# Patient Record
Sex: Female | Born: 1975 | Race: Black or African American | Hispanic: No | Marital: Single | Smoking: Former smoker
Health system: Southern US, Community
[De-identification: ages and names within clinical notes are randomized; demographics above are authoritative.]

## PROBLEM LIST (undated history)

## (undated) DIAGNOSIS — F419 Anxiety disorder, unspecified: Secondary | ICD-10-CM

## (undated) DIAGNOSIS — K5732 Diverticulitis of large intestine without perforation or abscess without bleeding: Secondary | ICD-10-CM

## (undated) DIAGNOSIS — M1990 Unspecified osteoarthritis, unspecified site: Secondary | ICD-10-CM

## (undated) DIAGNOSIS — R011 Cardiac murmur, unspecified: Secondary | ICD-10-CM

## (undated) DIAGNOSIS — K219 Gastro-esophageal reflux disease without esophagitis: Secondary | ICD-10-CM

## (undated) DIAGNOSIS — E669 Obesity, unspecified: Secondary | ICD-10-CM

## (undated) DIAGNOSIS — Z22322 Carrier or suspected carrier of Methicillin resistant Staphylococcus aureus: Secondary | ICD-10-CM

## (undated) DIAGNOSIS — D509 Iron deficiency anemia, unspecified: Secondary | ICD-10-CM

## (undated) DIAGNOSIS — E0590 Thyrotoxicosis, unspecified without thyrotoxic crisis or storm: Secondary | ICD-10-CM

## (undated) DIAGNOSIS — L732 Hidradenitis suppurativa: Secondary | ICD-10-CM

## (undated) HISTORY — PX: TUBAL LIGATION: SHX77

## (undated) HISTORY — DX: Obesity, unspecified: E66.9

## (undated) HISTORY — DX: Diverticulitis of large intestine without perforation or abscess without bleeding: K57.32

---

## 1998-04-24 ENCOUNTER — Emergency Department (HOSPITAL_COMMUNITY): Admission: EM | Admit: 1998-04-24 | Discharge: 1998-04-24 | Payer: Self-pay | Admitting: Emergency Medicine

## 1998-10-12 ENCOUNTER — Emergency Department (HOSPITAL_COMMUNITY): Admission: EM | Admit: 1998-10-12 | Discharge: 1998-10-12 | Payer: Self-pay | Admitting: Emergency Medicine

## 1998-12-08 ENCOUNTER — Emergency Department (HOSPITAL_COMMUNITY): Admission: EM | Admit: 1998-12-08 | Discharge: 1998-12-08 | Payer: Self-pay | Admitting: Emergency Medicine

## 1998-12-26 ENCOUNTER — Emergency Department (HOSPITAL_COMMUNITY): Admission: EM | Admit: 1998-12-26 | Discharge: 1998-12-26 | Payer: Self-pay | Admitting: Emergency Medicine

## 1999-09-12 ENCOUNTER — Emergency Department (HOSPITAL_COMMUNITY): Admission: EM | Admit: 1999-09-12 | Discharge: 1999-09-12 | Payer: Self-pay | Admitting: Emergency Medicine

## 1999-10-02 ENCOUNTER — Encounter: Admission: RE | Admit: 1999-10-02 | Discharge: 1999-10-02 | Payer: Self-pay | Admitting: Obstetrics & Gynecology

## 1999-12-13 ENCOUNTER — Inpatient Hospital Stay (HOSPITAL_COMMUNITY): Admission: AD | Admit: 1999-12-13 | Discharge: 1999-12-13 | Payer: Self-pay | Admitting: Obstetrics & Gynecology

## 2000-06-11 ENCOUNTER — Emergency Department (HOSPITAL_COMMUNITY): Admission: EM | Admit: 2000-06-11 | Discharge: 2000-06-11 | Payer: Self-pay | Admitting: Emergency Medicine

## 2000-06-11 ENCOUNTER — Encounter: Payer: Self-pay | Admitting: Emergency Medicine

## 2000-07-10 ENCOUNTER — Emergency Department (HOSPITAL_COMMUNITY): Admission: EM | Admit: 2000-07-10 | Discharge: 2000-07-10 | Payer: Self-pay | Admitting: Emergency Medicine

## 2000-07-14 ENCOUNTER — Encounter: Payer: Self-pay | Admitting: Emergency Medicine

## 2000-07-14 ENCOUNTER — Emergency Department (HOSPITAL_COMMUNITY): Admission: EM | Admit: 2000-07-14 | Discharge: 2000-07-14 | Payer: Self-pay | Admitting: Emergency Medicine

## 2000-10-15 ENCOUNTER — Ambulatory Visit (HOSPITAL_COMMUNITY): Admission: RE | Admit: 2000-10-15 | Discharge: 2000-10-15 | Payer: Self-pay | Admitting: *Deleted

## 2000-10-15 ENCOUNTER — Encounter: Payer: Self-pay | Admitting: *Deleted

## 2001-02-09 ENCOUNTER — Ambulatory Visit (HOSPITAL_COMMUNITY): Admission: RE | Admit: 2001-02-09 | Discharge: 2001-02-09 | Payer: Self-pay | Admitting: *Deleted

## 2001-02-16 ENCOUNTER — Emergency Department (HOSPITAL_COMMUNITY): Admission: EM | Admit: 2001-02-16 | Discharge: 2001-02-16 | Payer: Self-pay | Admitting: Emergency Medicine

## 2001-03-01 ENCOUNTER — Inpatient Hospital Stay (HOSPITAL_COMMUNITY): Admission: AD | Admit: 2001-03-01 | Discharge: 2001-03-01 | Payer: Self-pay | Admitting: *Deleted

## 2001-03-06 ENCOUNTER — Encounter: Payer: Self-pay | Admitting: Obstetrics & Gynecology

## 2001-03-06 ENCOUNTER — Inpatient Hospital Stay (HOSPITAL_COMMUNITY): Admission: AD | Admit: 2001-03-06 | Discharge: 2001-03-06 | Payer: Self-pay | Admitting: Obstetrics & Gynecology

## 2001-03-10 ENCOUNTER — Ambulatory Visit (HOSPITAL_COMMUNITY): Admission: RE | Admit: 2001-03-10 | Discharge: 2001-03-10 | Payer: Self-pay | Admitting: Obstetrics

## 2001-03-13 ENCOUNTER — Encounter (HOSPITAL_COMMUNITY): Admission: RE | Admit: 2001-03-13 | Discharge: 2001-03-23 | Payer: Self-pay | Admitting: *Deleted

## 2001-03-20 ENCOUNTER — Encounter (INDEPENDENT_AMBULATORY_CARE_PROVIDER_SITE_OTHER): Payer: Self-pay | Admitting: Specialist

## 2001-03-20 ENCOUNTER — Inpatient Hospital Stay (HOSPITAL_COMMUNITY): Admission: AD | Admit: 2001-03-20 | Discharge: 2001-03-25 | Payer: Self-pay | Admitting: Obstetrics & Gynecology

## 2001-03-27 ENCOUNTER — Inpatient Hospital Stay (HOSPITAL_COMMUNITY): Admission: AD | Admit: 2001-03-27 | Discharge: 2001-03-27 | Payer: Self-pay | Admitting: Obstetrics & Gynecology

## 2001-09-29 ENCOUNTER — Emergency Department (HOSPITAL_COMMUNITY): Admission: EM | Admit: 2001-09-29 | Discharge: 2001-09-29 | Payer: Self-pay | Admitting: Emergency Medicine

## 2001-11-06 ENCOUNTER — Encounter: Admission: RE | Admit: 2001-11-06 | Discharge: 2001-11-06 | Payer: Self-pay | Admitting: Family Medicine

## 2001-11-06 ENCOUNTER — Other Ambulatory Visit: Admission: RE | Admit: 2001-11-06 | Discharge: 2001-11-06 | Payer: Self-pay | Admitting: Sports Medicine

## 2001-12-07 ENCOUNTER — Encounter: Admission: RE | Admit: 2001-12-07 | Discharge: 2001-12-07 | Payer: Self-pay | Admitting: Family Medicine

## 2002-01-06 ENCOUNTER — Encounter: Admission: RE | Admit: 2002-01-06 | Discharge: 2002-01-06 | Payer: Self-pay | Admitting: Family Medicine

## 2002-01-07 ENCOUNTER — Ambulatory Visit (HOSPITAL_COMMUNITY): Admission: RE | Admit: 2002-01-07 | Discharge: 2002-01-07 | Payer: Self-pay | Admitting: Family Medicine

## 2002-01-28 ENCOUNTER — Encounter: Admission: RE | Admit: 2002-01-28 | Discharge: 2002-01-28 | Payer: Self-pay | Admitting: Family Medicine

## 2002-02-23 ENCOUNTER — Inpatient Hospital Stay (HOSPITAL_COMMUNITY): Admission: AD | Admit: 2002-02-23 | Discharge: 2002-02-23 | Payer: Self-pay | Admitting: *Deleted

## 2002-02-24 ENCOUNTER — Inpatient Hospital Stay (HOSPITAL_COMMUNITY): Admission: AD | Admit: 2002-02-24 | Discharge: 2002-02-24 | Payer: Self-pay | Admitting: Obstetrics and Gynecology

## 2002-03-04 ENCOUNTER — Encounter: Admission: RE | Admit: 2002-03-04 | Discharge: 2002-03-04 | Payer: Self-pay | Admitting: Family Medicine

## 2002-03-18 ENCOUNTER — Inpatient Hospital Stay (HOSPITAL_COMMUNITY): Admission: AD | Admit: 2002-03-18 | Discharge: 2002-03-18 | Payer: Self-pay | Admitting: *Deleted

## 2002-04-03 ENCOUNTER — Inpatient Hospital Stay (HOSPITAL_COMMUNITY): Admission: AD | Admit: 2002-04-03 | Discharge: 2002-04-03 | Payer: Self-pay | Admitting: *Deleted

## 2002-04-06 ENCOUNTER — Encounter: Admission: RE | Admit: 2002-04-06 | Discharge: 2002-04-06 | Payer: Self-pay | Admitting: Family Medicine

## 2002-05-05 ENCOUNTER — Encounter: Admission: RE | Admit: 2002-05-05 | Discharge: 2002-05-05 | Payer: Self-pay | Admitting: Family Medicine

## 2002-05-10 ENCOUNTER — Encounter: Admission: RE | Admit: 2002-05-10 | Discharge: 2002-05-10 | Payer: Self-pay | Admitting: Family Medicine

## 2002-05-18 ENCOUNTER — Encounter: Admission: RE | Admit: 2002-05-18 | Discharge: 2002-05-18 | Payer: Self-pay | Admitting: Family Medicine

## 2002-05-21 ENCOUNTER — Encounter (INDEPENDENT_AMBULATORY_CARE_PROVIDER_SITE_OTHER): Payer: Self-pay

## 2002-05-21 ENCOUNTER — Inpatient Hospital Stay (HOSPITAL_COMMUNITY): Admission: AD | Admit: 2002-05-21 | Discharge: 2002-05-24 | Payer: Self-pay | Admitting: *Deleted

## 2002-05-27 ENCOUNTER — Encounter: Admission: RE | Admit: 2002-05-27 | Discharge: 2002-05-27 | Payer: Self-pay | Admitting: Family Medicine

## 2002-06-30 ENCOUNTER — Encounter: Admission: RE | Admit: 2002-06-30 | Discharge: 2002-06-30 | Payer: Self-pay | Admitting: Family Medicine

## 2002-10-02 ENCOUNTER — Emergency Department (HOSPITAL_COMMUNITY): Admission: EM | Admit: 2002-10-02 | Discharge: 2002-10-02 | Payer: Self-pay | Admitting: Emergency Medicine

## 2003-03-22 ENCOUNTER — Encounter: Admission: RE | Admit: 2003-03-22 | Discharge: 2003-03-22 | Payer: Self-pay | Admitting: Family Medicine

## 2003-04-05 ENCOUNTER — Encounter: Admission: RE | Admit: 2003-04-05 | Discharge: 2003-04-05 | Payer: Self-pay | Admitting: *Deleted

## 2003-04-12 ENCOUNTER — Encounter: Payer: Self-pay | Admitting: Sports Medicine

## 2003-04-12 ENCOUNTER — Encounter: Admission: RE | Admit: 2003-04-12 | Discharge: 2003-04-12 | Payer: Self-pay | Admitting: Sports Medicine

## 2003-05-09 ENCOUNTER — Other Ambulatory Visit: Admission: RE | Admit: 2003-05-09 | Discharge: 2003-05-09 | Payer: Self-pay | Admitting: Family Medicine

## 2003-05-09 ENCOUNTER — Encounter: Admission: RE | Admit: 2003-05-09 | Discharge: 2003-05-09 | Payer: Self-pay | Admitting: Family Medicine

## 2003-05-11 ENCOUNTER — Encounter: Admission: RE | Admit: 2003-05-11 | Discharge: 2003-05-11 | Payer: Self-pay | Admitting: Sports Medicine

## 2003-05-11 ENCOUNTER — Encounter: Payer: Self-pay | Admitting: Sports Medicine

## 2003-06-20 ENCOUNTER — Encounter: Admission: RE | Admit: 2003-06-20 | Discharge: 2003-06-20 | Payer: Self-pay | Admitting: Family Medicine

## 2003-06-24 ENCOUNTER — Encounter: Admission: RE | Admit: 2003-06-24 | Discharge: 2003-06-24 | Payer: Self-pay | Admitting: Family Medicine

## 2003-07-11 ENCOUNTER — Encounter: Admission: RE | Admit: 2003-07-11 | Discharge: 2003-07-11 | Payer: Self-pay | Admitting: Sports Medicine

## 2003-07-23 ENCOUNTER — Emergency Department (HOSPITAL_COMMUNITY): Admission: EM | Admit: 2003-07-23 | Discharge: 2003-07-23 | Payer: Self-pay | Admitting: Emergency Medicine

## 2003-07-24 ENCOUNTER — Emergency Department (HOSPITAL_COMMUNITY): Admission: AD | Admit: 2003-07-24 | Discharge: 2003-07-24 | Payer: Self-pay | Admitting: Emergency Medicine

## 2003-10-10 ENCOUNTER — Emergency Department (HOSPITAL_COMMUNITY): Admission: AD | Admit: 2003-10-10 | Discharge: 2003-10-10 | Payer: Self-pay | Admitting: Family Medicine

## 2005-06-24 ENCOUNTER — Ambulatory Visit: Payer: Self-pay | Admitting: Internal Medicine

## 2006-01-01 ENCOUNTER — Emergency Department (HOSPITAL_COMMUNITY): Admission: EM | Admit: 2006-01-01 | Discharge: 2006-01-01 | Payer: Self-pay | Admitting: Emergency Medicine

## 2006-03-19 ENCOUNTER — Emergency Department (HOSPITAL_COMMUNITY): Admission: EM | Admit: 2006-03-19 | Discharge: 2006-03-19 | Payer: Self-pay | Admitting: Emergency Medicine

## 2006-10-25 ENCOUNTER — Emergency Department (HOSPITAL_COMMUNITY): Admission: EM | Admit: 2006-10-25 | Discharge: 2006-10-25 | Payer: Self-pay | Admitting: Family Medicine

## 2006-11-04 ENCOUNTER — Ambulatory Visit: Payer: Self-pay | Admitting: Internal Medicine

## 2006-12-02 ENCOUNTER — Ambulatory Visit: Payer: Self-pay | Admitting: Internal Medicine

## 2007-01-18 ENCOUNTER — Emergency Department (HOSPITAL_COMMUNITY): Admission: EM | Admit: 2007-01-18 | Discharge: 2007-01-18 | Payer: Self-pay | Admitting: Family Medicine

## 2007-03-05 ENCOUNTER — Ambulatory Visit: Payer: Self-pay | Admitting: Family Medicine

## 2007-06-16 ENCOUNTER — Ambulatory Visit: Payer: Self-pay | Admitting: Internal Medicine

## 2007-08-06 ENCOUNTER — Ambulatory Visit: Payer: Self-pay | Admitting: Internal Medicine

## 2007-10-05 ENCOUNTER — Ambulatory Visit: Payer: Self-pay | Admitting: Internal Medicine

## 2007-10-20 ENCOUNTER — Ambulatory Visit: Payer: Self-pay | Admitting: Family Medicine

## 2008-09-15 ENCOUNTER — Ambulatory Visit: Payer: Self-pay | Admitting: Internal Medicine

## 2008-09-21 ENCOUNTER — Encounter: Admission: RE | Admit: 2008-09-21 | Discharge: 2008-09-21 | Payer: Self-pay | Admitting: Internal Medicine

## 2009-03-02 ENCOUNTER — Ambulatory Visit: Payer: Self-pay | Admitting: Internal Medicine

## 2009-03-02 ENCOUNTER — Encounter (INDEPENDENT_AMBULATORY_CARE_PROVIDER_SITE_OTHER): Payer: Self-pay | Admitting: Adult Health

## 2009-09-22 ENCOUNTER — Encounter: Admission: RE | Admit: 2009-09-22 | Discharge: 2009-09-22 | Payer: Self-pay | Admitting: Obstetrics

## 2009-11-25 HISTORY — PX: WISDOM TOOTH EXTRACTION: SHX21

## 2010-02-02 ENCOUNTER — Ambulatory Visit (HOSPITAL_COMMUNITY): Admission: RE | Admit: 2010-02-02 | Discharge: 2010-02-02 | Payer: Self-pay | Admitting: Obstetrics

## 2010-03-29 ENCOUNTER — Emergency Department (HOSPITAL_COMMUNITY): Admission: EM | Admit: 2010-03-29 | Discharge: 2010-03-29 | Payer: Self-pay | Admitting: Emergency Medicine

## 2010-10-05 ENCOUNTER — Emergency Department (HOSPITAL_COMMUNITY): Admission: EM | Admit: 2010-10-05 | Discharge: 2010-10-06 | Payer: Self-pay | Admitting: Emergency Medicine

## 2011-04-12 NOTE — Op Note (Signed)
[facility]  Patient:    [name] Visit Number: [id number] MRN: [id number]          Service Type: OBS Location: 910A 9135 01 Attending Physician:  Michaelle Copas Dictated by:   Clement Husbands, M.D. Proc. Date: 05/21/02 Admit Date:  05/21/2002                             Operative Report  PREOPERATIVE DIAGNOSES:       1. Previous cesarean section.                               2. Term pregnancy.                               3. Request for sterilization.  POSTOPERATIVE DIAGNOSES:      1. Previous cesarean section.                               2. Term pregnancy.                               3. Request for sterilization.  OPERATIONS:                   1. Repeat low transverse cervical cesarean                                  section.                               2. Bilateral tubal sterilization with partial                                  salpingectomies.  SURGEON:                      Clement Husbands, M.D.  ASSISTANT:                    Conni Elliot, M.D.  ANESTHESIA:                   Epidural.  DESCRIPTION OF PROCEDURE:     With the patient under satisfactory epidural anesthesia in the supine position, the abdomen was prepped, the bladder catheterized and the abdomen was also "slung" upwards with tape to lift the large panniculus out of the way.  The abdomen was draped.  A lower abdominal transverse skin incision was made in the old scar and carried down through thick subcutaneous layer to the rectus fascia, which was sharply and transversely divided.  The peritoneal cavity was entered.  Where the previous low transverse uterine scar was, was now a very large window. This almost extended from side to side and was probably 6 cm apart in the up-and-down dimension.  One could see the babys scalp, a hand and the umbilical cord.  The bladder peritoneum was carefully taken down.  The bladder was pushed  inferiorly.  The presenting amnion was nicked and  it was extended lateral.  Amniotic fluid was clear.  The vertex was delivered, followed by the rest of the baby.  Spontaneous respirations and cry were noted.  The cord was doubly clamped and divided.  The infant was shown to the mother and passed on to the pediatricians in attendance.  Cord blood was obtained.  The placenta was manually removed.  The uterus was explored and was clean.  The uterus was lifted up onto the abdominal wall.  The internal os was dilated.  The lower uterine segment of the uterus was markedly thinned out.  With care, the uterine "window" was closed with running locking 0 Vicryl suture.  The uterine musculature was then imbricated with a running 0 Vicryl suture.  There was a little bit of bleeding in the right corner, which required a figure-of-eight suture to secure.  The suture line was irrigated.  Hemostasis was good.  The fallopian tubes and ovaries were then identified and were normal.  Both fallopian tubes in turn were grasped in their midsegment.  Coagulating cautery made a little window and then the tubes on each side were individually ligated with 2-0 plain catgut.  The ligated segment was then excised so that the tubal stumps on each side were about 2 to 2.5 cm apart.  The suture of the uterine incision area was inspected. Hemostasis was good.  The uterus was positioned in the abdomen.  The anterior peritoneum was closed with a running 0 Vicryl suture.  The rectus muscles in the lower half were approximated with interrupted Vicryl suture. The rectus fascia was closed with two segments of running 0 Vicryl suture. The subcutaneous tissue layer was irrigated.  It was approximated with interrupted 3-0 Vicryl suture.  The skin edges were approximated with wide skin staples.  Estimated blood loss 300-500 cc.  Sponge and needle count was correct.  The patient tolerated the procedure well and was returned to  the recovery room in satisfactory condition. Dictated by:   Clement Husbands, M.D. Attending Physician:  Michaelle Copas DD:  05/21/02 TD:  05/23/02 Job: [id number]

## 2011-04-12 NOTE — Op Note (Signed)
[facility]  Patient:    [name], [name]                      MRN: [id number] Proc. Date: 03/23/01 Adm. Date:  [id number] Disc. Date: [id number] Attending:  Michaelle Copas                           Operative Report  PREOPERATIVE DIAGNOSES:       1. Intrauterine pregnancy at term.                               2. Mild pregnancy-induced hypertension,                                  gestational hypertension.                               3. Persistent occipitoposterior.                               4. Suspicious fetal heart tracing with                                  repetitive severe variable decelerations.  POSTOPERATIVE DIAGNOSES:      1. Intrauterine pregnancy at term.                               2. Mild pregnancy-induced hypertension,                                  gestational hypertension.                               3. Persistent occipitoposterior.                               4. Suspicious fetal heart tracing with                                  repetitive severe variable decelerations.  OPERATION:                    Primary low transverse cesarean section via                               Pfannenstiel.  SURGEON:                      Roseanna Rainbow, M.D.  ASSISTANT:                    Maryelizabeth Rowan, M.D.  ANESTHESIA:                   Epidural.  COMPLICATIONS:  None.  ESTIMATED BLOOD LOSS:         800 cc.  FLUIDS:                       1800 cc of lactated Ringers.  URINE OUTPUT:                 300 cc of clear urine.  INDICATIONS:                  The patient is a 35 year old para 0, at 38+ weeks.  Induced secondary to pregnancy-induced hypertension.  Repetitive severe variable decelerations with oxytocin, maximum dilatation 8 cm and persistent occiput posterior presentation.  FINDINGS:                     Anterior cephalic presentation.  Neonatology present at delivery.  Apgars 8 and 9 at  one and five minutes.  Weight 6 pounds and 6 ounces.  Umbilical artery pH 7.33.  Normal uterus, tubes, and ovaries.  DESCRIPTION OF PROCEDURE:     The patient was taken to the operating room where epidural anesthetic was found to be adequate.  She was then prepped and draped in the usual sterile fashion in the dorsal supine position with a leftward tilt.  A Pfannenstiel skin incision was then made with the scalpel and carried through to the underlying layer of fascia.  The fascia was nicked in the midline and the incision extended laterally with Mayo scissors.  The superior aspect of the fascial incision was then grasped with the Kocher clamps, elevated, and the underlying rectus muscles dissected off.  Attention was then turned to the inferior aspect of this incision which was manipulated in a similar fashion.  The rectus muscles were separated in the midline and the peritoneum identified, tinted up, and entered sharply.  The peritoneal incision was then extended superiorly and inferiorly with good visualization of the bladder.  The bladder blade was then inserted and the vesicouterine peritoneum identified, grasped with pickups, and entered sharply with the Metzenbaum scissors.  This incision was then extended laterally and the bladder flap created digitally.  The bladder blade was then reinserted and the lower uterine segment incised in a transverse fashion with the scalpel. The uterine incision was then extended laterally with the bandaged scissors. The bladder blade was removed and the infants head delivered atraumatically. The nose and mouth were suctioned with bulb suction, and the cord clamped and cut.  The infant was handed off to the awaiting neonatologist.  Umbilical artery cord gas was sent.  The placenta was then removed.  The uterus exteriorized and cleared of all clots and debris.  Upon inspection of the uterine incision, there is a small extension, approximately 1.5 cm in  length extending from the left inferolateral margin of the uterine incision.  This was repaired separately with 0 Monocryl in a running locked fashion.  The remainder of the uterine incision was repaired again with 0 Monocryl in a running locked fashion. Excellent hemostasis was noted.  The uterus was returned to the abdomen.  The gutters were cleared of all clots.  The fascia was reapproximated with 0 PDS in a running fashion.  The skin was closed with staples.  The patient tolerated the procedure well.  Sponge, lap, and needle counts were correct x 2.  The patient was taken to the PACU in stable condition. DD:  03/23/01 TD:  03/23/01 Job: [id number]

## 2011-04-12 NOTE — Discharge Summary (Signed)
[facility]  Patient:    [name], [name] Visit Number: [id number] MRN: [id number]          Service Type: OBS Location: 910A 9135 01 Attending Physician:  Michaelle Copas Dictated by:   Mont Dutton, M.D. Admit Date:  05/21/2002 Discharge Date: 05/24/2002                             Discharge Summary  DATE OF BIRTH:  11/19/1976  DISCHARGE DIAGNOSES: 1. Status post repeat low transverse cesarean section of a female. 2. Status post bilateral tubal ligation.  HISTORY OF PRESENT ILLNESS:  The patient is a 35 year old, G1, P0-1-0-1, with a prior cesarean section for cephalopelvic disproportion with an estimated date of confinement of 05/21/02, who presents for repeat cesarean section and tubal ligation.  HOSPITAL COURSE:  The patient underwent a low transverse cesarean section on 05/21/02, a viable 6 pound 12 ounce female with Apgars of 9 at one and 9 at five.  Also had a bilateral tubal sterilization at the same time by physicians Dr. Perlie Gold and Dr. Gavin Potters under epidural anesthesia.  Estimated blood loss was 300 to 500 cc.  For further details of delivery, please consult the dictated operative note by Dr. Perlie Gold.  Mother did well postoperatively, staples were intact.  She was bottle feeding her baby well, minimal lochia.  Hemoglobin postoperatively was 11.8.  Received fair pain relief with Percocet.  Routine postpartum care provided.  On the day of discharge, 05/24/02, the patient had been eating well, had bowel movements, tolerating her diet, wound appeared intact with staples intact and no drainage.  She was bottle feeding well, and the babys weight had surpassed its birth weight per mom.  For contraception she is status post bilateral tubal ligation.  The patient was stable for discharge.  Decision made for the patient to return to [facility]U in three days for staple removal secondary to her obesity.  The patient was stable for  discharge on 05/24/02.  DISCHARGE MEDICATIONS: 1. Percocet 5/325 mg one to two tabs p.o. q.6h. p.r.n. severe pain. 2. Ibuprofen 600 mg tabs one tab p.o. q.6h. p.r.n. moderate pain. 3. Prenatal vitamin one tab p.o. q.d.  FOLLOWUP: 1. Return to [facility] in three days for staple removal. 2. [facility] with Dr. Pricilla Holm in six weeks.  The    patient is to call [phone] to schedule.  The remainder of wound care per instruction booklet.  DIET:  Regular.  ACTIVITY:  Activity and sexual activity per booklet.  CONDITION ON DISCHARGE:  Stable. Dictated by:   Mont Dutton, M.D. Attending Physician:  Michaelle Copas DD:  05/24/02 TD:  05/25/02 Job: [id number]

## 2011-04-12 NOTE — Discharge Summary (Signed)
[facility]  Patient:    [name], [name]                      MRN: [id number] Adm. Date:  [id number] Disc. Date: [id number] Attending:  Michaelle Copas Dictator:   Jamey Reas, M.D.                           Discharge Summary  DATE OF BIRTH:                01/09/1976.  ADMISSION DIAGNOSIS:          Induction of labor for pregnancy induced                               hypertension.  DISCHARGE DIAGNOSES:          1. Induction of labor for pregnancy induced                                  hypertension.                               2. Status post low transverse cesarean section                                  for arrest of dilatation and deep variable                                  decelerations, persistent occiput posterior                                  position.  SERVICE:                      OB Teaching Service.  PROCEDURES:                   Primary low transverse cesarean section via Pfannenstiel performed by Dr. Antionette Char, assisted by Dr. Maryelizabeth Rowan on March 23, 2001.  HOSPITAL COURSE:              The patient is a 35 year old G2, P1-0-1-1, at 38+ weeks induced secondary to pregnancy induced hypertension. ______ Repetitive severe variable decelerations with oxytocin, maximum dilatation 8 cm and persistent occiput posterior presentation. She had a routine postoperative course. She delivered a viable female infant with Apgars of 8 at one minute, and 9 at five minutes. The patient was ambulating well at the time of discharge. The patient had elevated blood pressures but did not have any neurological symptoms associated with this. On discharge, her blood pressure was stable. The patient was tolerating a regular diet and ambulating without difficulty.  DISPOSITION:                  The patient will be discharged home.  DISCHARGE CONDITION:          Stable.  DISCHARGE MEDICATIONS:        1. Prenatal vitamins  one p.o. q.d. x six weeks.  2. Motrin 600 mg one p.o. q.6-8h. p.r.n. pain.                               3. Percocet one to two p.o. q.4-6h. p.r.n. pain.                               4. Ortho Tri-Cyclen take as directed, starting                                  Sunday, May 12.  DISCHARGE INSTRUCTIONS:       Routine postoperative.  DISCHARGE FOLLOWUP:           The patient is to followup at [facility] on Friday, May 3 staple removal. She is to follow up in six weeks at [facility] for routine postpartum visit. DD:  03/25/01 TD:  03/25/01 Job: [id number]

## 2011-08-17 ENCOUNTER — Emergency Department (HOSPITAL_COMMUNITY)
Admission: EM | Admit: 2011-08-17 | Discharge: 2011-08-17 | Disposition: A | Discharge status: Home / Self Care | Payer: Medicare Other | Attending: Emergency Medicine | Admitting: Emergency Medicine

## 2011-08-17 DIAGNOSIS — L02419 Cutaneous abscess of limb, unspecified: Secondary | ICD-10-CM | POA: Insufficient documentation

## 2011-08-17 DIAGNOSIS — F17200 Nicotine dependence, unspecified, uncomplicated: Secondary | ICD-10-CM | POA: Insufficient documentation

## 2011-08-17 DIAGNOSIS — J45909 Unspecified asthma, uncomplicated: Secondary | ICD-10-CM | POA: Insufficient documentation

## 2011-08-17 DIAGNOSIS — M79609 Pain in unspecified limb: Secondary | ICD-10-CM | POA: Insufficient documentation

## 2012-01-13 ENCOUNTER — Encounter (HOSPITAL_COMMUNITY): Payer: Self-pay | Admitting: *Deleted

## 2012-01-13 ENCOUNTER — Emergency Department (HOSPITAL_COMMUNITY)
Admission: EM | Admit: 2012-01-13 | Discharge: 2012-01-13 | Disposition: A | Discharge status: Home / Self Care | Payer: Medicare Other | Source: Home / Self Care

## 2012-01-13 DIAGNOSIS — M722 Plantar fascial fibromatosis: Secondary | ICD-10-CM

## 2012-01-13 HISTORY — DX: Hidradenitis suppurativa: L73.2

## 2012-01-13 NOTE — Discharge Instructions (Signed)
Try a foot insert in your shoes or slippers like Dr. Early Osmond foot inserts.   Take Alleve to help with the inflammation.   Plantar Fasciitis Plantar fasciitis is a common condition that causes foot pain. It is soreness (inflammation) of the band of tough fibrous tissue on the bottom of the foot that runs from the heel bone (calcaneus) to the ball of the foot. The cause of this soreness may be from excessive standing, poor fitting shoes, running on hard surfaces, being overweight, having an abnormal walk, or overuse (this is common in runners) of the painful foot or feet. It is also common in aerobic exercise dancers and ballet dancers. SYMPTOMS  Most people with plantar fasciitis complain of:  Severe pain in the morning on the bottom of their foot especially when taking the first steps out of bed. This pain recedes after a few minutes of walking.   Severe pain is experienced also during walking following a long period of inactivity.   Pain is worse when walking barefoot or up stairs  DIAGNOSIS   Your caregiver will diagnose this condition by examining and feeling your foot.   Special tests such as X-rays of your foot, are usually not needed.  PREVENTION   Consult a sports medicine professional before beginning a new exercise program.   Walking programs offer a good workout. With walking there is a lower chance of overuse injuries common to runners. There is less impact and less jarring of the joints.   Begin all new exercise programs slowly. If problems or pain develop, decrease the amount of time or distance until you are at a comfortable level.   Wear good shoes and replace them regularly.   Stretch your foot and the heel cords at the back of the ankle (Achilles tendon) both before and after exercise.   Run or exercise on even surfaces that are not hard. For example, asphalt is better than pavement.   Do not run barefoot on hard surfaces.   If using a treadmill, vary the incline.     Do not continue to workout if you have foot or joint problems. Seek professional help if they do not improve.  HOME CARE INSTRUCTIONS   Avoid activities that cause you pain until you recover.   Use ice or cold packs on the problem or painful areas after working out.   Only take over-the-counter or prescription medicines for pain, discomfort, or fever as directed by your caregiver.   Soft shoe inserts or athletic shoes with air or gel sole cushions may be helpful.   If problems continue or become more severe, consult a sports medicine caregiver or your own health care provider. Cortisone is a potent anti-inflammatory medication that may be injected into the painful area. You can discuss this treatment with your caregiver.  MAKE SURE YOU:   Understand these instructions.   Will watch your condition.   Will get help right away if you are not doing well or get worse.  Document Released: 08/06/2001 Document Revised: 07/24/2011 Document Reviewed: 10/05/2008 [facility] Patient Information 2012 [place], [place].

## 2012-01-13 NOTE — ED Provider Notes (Signed)
Medical screening examination/treatment/procedure(s) were performed by resident physician and as supervising physician I was immediately available for consultation/collaboration.  [name], MD  [name], MD 01/13/12 2155 

## 2012-01-13 NOTE — ED Provider Notes (Signed)
[name] is a 36 y.o. female who presents to Urgent Care today for Left foot pain. She states she was walking outside 2 weeks ago and stepped on holly leaf.  Since then she has been complaining of pain on Left heel, occasionally radiating to arch of foot.  Worse at end of day and also first thing in AM.  Only wears flip flops or slippers, no tennis shoes.  She has been put on doxycycline and hydrocodone for another condition but states this did not help her pain yesterday and therefore she presented to urgent Care.  No fevers or chills.     PMH reviewed.  ROS as above otherwise neg Medications reviewed. No current facility-administered medications for this encounter.   Current Outpatient Prescriptions  Medication Sig Dispense Refill  . Albuterol (PROVENTIL IN) Inhale into the lungs.      Marland Kitchen DOXYCYCLINE HYCLATE PO Take by mouth.      Marland Kitchen HYDROCODONE-ACETAMINOPHEN PO Take by mouth.        Exam:  BP 154/100  Pulse 81  Temp(Src) 98.8 F (37.1 C) (Oral)  Resp 20  SpO2 100%  LMP 12/23/2011 Gen: Well NAD HEENT: EOMI,  MMM Lungs: CTABL Nl WOB Heart: RRR no MRG Abd: NABS, NT, ND Exts: Non edematous BL  LE, warm and well perfused.  Right foot:  WNL Left foot:  TTP at heel of foot.  Tender through arch.  No swelling, erythema, edema, or signs of lymphangitis. No pain on plantar or dorsiflexion.  No sign of entrance wound, unable to palpate any foreign body located in foot.    Assessment and Plan: 1.  Plantar fascitis:  Most likely diagnosis in this obese female who has little arch support.  After discussion, patient admits to some foot pain prior to stepping on holly leaf.  Plan to treat with foot insert and better shoes.  Ibuprofen or Naproxen for pain relief.  FU with PCP in 2 weeks.     Renold Don, MD 01/13/12 2111

## 2012-01-13 NOTE — ED Notes (Signed)
C/O left foot pain after stepping on something approx 2 wks ago.  Has been taking her normal hydrocodone.

## 2012-04-16 ENCOUNTER — Encounter (HOSPITAL_COMMUNITY): Payer: Self-pay | Admitting: Cardiology

## 2012-04-16 ENCOUNTER — Emergency Department (INDEPENDENT_AMBULATORY_CARE_PROVIDER_SITE_OTHER)
Admission: EM | Admit: 2012-04-16 | Discharge: 2012-04-16 | Disposition: A | Discharge status: Home / Self Care | Payer: Medicare Other | Source: Home / Self Care | Attending: Emergency Medicine | Admitting: Emergency Medicine

## 2012-04-16 DIAGNOSIS — H109 Unspecified conjunctivitis: Secondary | ICD-10-CM

## 2012-04-16 MED ORDER — OLOPATADINE HCL 0.1 % OP SOLN: 1.0000 [drp] | Freq: Two times a day (BID) | OPHTHALMIC | Status: AC

## 2012-04-16 MED ORDER — POLYMYXIN B-TRIMETHOPRIM 10000-0.1 UNIT/ML-% OP SOLN: 1.0000 [drp] | OPHTHALMIC | Status: AC

## 2012-04-16 MED ORDER — CROMOLYN SODIUM 4 % OP SOLN: 1.0000 [drp] | Freq: Four times a day (QID) | OPHTHALMIC | Status: AC

## 2012-04-16 NOTE — ED Notes (Signed)
Pt reports 2 weeks ago bilat eyes were itchy and burning due to allergies. Has taken some OTC allergy med and used prescription eye drops from previous eye problem. Some relief but today has noticed drainage and crusting from right eye more than left. Denies fever.

## 2012-04-16 NOTE — Discharge Instructions (Signed)
Conjunctivitis Conjunctivitis is commonly called "pink eye." Conjunctivitis can be caused by bacterial or viral infection, allergies, or injuries. There is usually redness of the lining of the eye, itching, discomfort, and sometimes discharge. There may be deposits of matter along the eyelids. A viral infection usually causes a watery discharge, while a bacterial infection causes a yellowish, thick discharge. Pink eye is very contagious and spreads by direct contact. You may be given antibiotic eyedrops as part of your treatment. Before using your eye medicine, remove all drainage from the eye by washing gently with warm water and cotton balls. Continue to use the medication until you have awakened 2 mornings in a row without discharge from the eye. Do not rub your eye. This increases the irritation and helps spread infection. Use separate towels from other household members. Wash your hands with soap and water before and after touching your eyes. Use cold compresses to reduce pain and sunglasses to relieve irritation from light. Do not wear contact lenses or wear eye makeup until the infection is gone. SEEK MEDICAL CARE IF:   Your symptoms are not better after 3 days of treatment.   You have increased pain or trouble seeing.   The outer eyelids become very red or swollen.  Document Released: 12/19/2004 Document Revised: 10/31/2011 Document Reviewed: 11/11/2005 [employer] Patient Information 2012 [employer]. 

## 2012-04-16 NOTE — ED Provider Notes (Signed)
Chief Complaint  Patient presents with  . Eye Problem    History of Present Illness:   The patient is a 36 year old female who has had a long-standing history of allergic conjunctivitis for which she has taken Patanol. She ran out of this about a week ago and ever since then has had redness of both eyes with some swelling of her lids, itching, and some crusting. Her drainage has been white. She does have some pain in her eyes, particularly if she puts and eyedrops. She has some itching of her nose but no rhinorrhea or congestion. No sore throat, adenopathy, fever, or cough. Her vision has been normal.  Review of Systems:  Other than noted above, the patient denies any of the following symptoms: Systemic:  No fever, chills, sweats, fatigue, or weight loss. Eye:  No redness, eye pain, photophobia, discharge, blurred vision, or diplopia. ENT:  No nasal congestion, rhinorrhea, or sore throat. Lymphatic:  No adenopathy. Skin:  No rash or pruritis.  PMFSH:  Past medical history, family history, social history, meds, and allergies were reviewed.  Physical Exam:   Vital signs:  BP 137/69  Pulse 82  Temp(Src) 97.8 F (36.6 C) (Oral)  Resp 20  SpO2 100%  LMP 03/15/2012 General:  Alert and in no distress. Eye:  Her lids appear normal. Conjunctiva is are little bit injected. She does have some crusting of the eyelashes. There is no drainage or exudates in the conjunctival sac. The cornea is intact. Anterior chambers normal. PERRLA, full EOMs. ENT:  TMs and canals clear.  Nasal mucosa normal.  No intra-oral lesions, mucous membranes moist, pharynx clear. Neck:  No adenopathy tenderness or mass. Skin:  Clear, warm and dry.  Assessment:  The encounter diagnosis was Conjunctivitis. This appears to be allergic, but there could be some component of infection as well. I gave her a refill on her Patanol but I suggested she try Opticrom since it is a little bit cheaper.  Plan:   1.  The following meds  were prescribed:   New Prescriptions   CROMOLYN (OPTICROM) 4 % OPHTHALMIC SOLUTION    Place 1 drop into both eyes 4 (four) times daily.   OLOPATADINE (PATANOL) 0.1 % OPHTHALMIC SOLUTION    Place 1 drop into both eyes 2 (two) times daily.   TRIMETHOPRIM-POLYMYXIN B (POLYTRIM) OPHTHALMIC SOLUTION    Place 1 drop into both eyes every 4 (four) hours.   2.  The patient was instructed in symptomatic care and handouts were given. 3.  The patient was told to return if becoming worse in any way, if no better in 3 or 4 days, and given some red flag symptoms that would indicate earlier return.     Reuben Likes, MD 04/16/12 1213

## 2012-07-03 ENCOUNTER — Encounter: Payer: Self-pay | Admitting: *Deleted

## 2012-07-03 ENCOUNTER — Encounter: Payer: Medicare Other | Attending: Family Medicine | Admitting: *Deleted

## 2012-07-03 DIAGNOSIS — E669 Obesity, unspecified: Secondary | ICD-10-CM | POA: Insufficient documentation

## 2012-07-03 DIAGNOSIS — Z713 Dietary counseling and surveillance: Secondary | ICD-10-CM | POA: Insufficient documentation

## 2012-07-03 NOTE — Progress Notes (Signed)
  Medical Nutrition Therapy:  Appt start time: 0800 end time:  0900.  Assessment:  Primary concerns today: patient here for obesity. States history of diet pills, many diets and extreme food restrictions with no success. Walks track 5 times around every other day. She also states she is on disability due to her skin condition which is quite painful and requires a lot of care on a daily basis. She states she would like to lose about 100 pounds to improve her health and that she has been told the weight loss would improve her skin condition as well.  MEDICATIONS: see list   DIETARY INTAKE:  Usual eating pattern includes 2 meals and 0 snacks per day.  Everyday foods include eggs, tuna and small amounts of carbohydrate foods.  Avoided foods include fried and other high fat foods, sweets.    24-hr recall:  B ( AM): none  Snk ( AM): none  L (12 PM): 2 eggs OR tuna with hot sauce OR PNB and jelly sandwich, water with Hydro cut powder weigh loss powder Snk ( PM): none D (8 PM): 2 eggs OR tuna with hot sauce OR sweetened cereal with 8 oz 2 % milk Snk ( PM): none Beverages: water with weight loss powder for flavored  Usual physical activity: walk track 5 laps every other day and active with kids  Estimated energy needs: 1200 calories 135 g carbohydrates 90 g protein 33 g fat  Progress Towards Goal(s):  In progress.   Nutritional Diagnosis:  NI-1.5 Excessive energy intake As related to activity level.  As evidenced by BMI of 54.8.    Intervention:  Nutrition counseling provided. Discussed calorie value of macro-nutrients and concept of too little carbohydrate causing a decrease in metabolism. Suggest she add carbohydrate in form of fresh fruit at breakfast and be aware of 2-3 carb choices at lunch and supper. Also discussed increasing her activity level to daily as she has been maintaining her weight with every other day walking. Plan: Aim for 2-3 Carb choices at each meal Aim for 3 meals a  day Consider being active every day  Consider Zumba for 15 minutes every day that you don't walk the track and increase as tolerated  Handouts given during visit include:  Carb Counting and Beyond Handout  Reading Food Labels  Menu Planner with 3 days of sample menus of foods she eats  Monitoring/Evaluation:  Dietary intake, exercise, reading food labels, and body weight in 4 week(s).

## 2012-07-03 NOTE — Patient Instructions (Addendum)
Plan: Aim for 2-3 Carb choices at each meal Aim for 3 meals a day Consider being active every day  Consider Zumba for 15 minutes every day that you don't walk the track and increase as tolerated

## 2012-07-28 ENCOUNTER — Ambulatory Visit: Payer: Medicare Other | Admitting: *Deleted

## 2012-08-07 ENCOUNTER — Ambulatory Visit: Payer: Medicare Other | Admitting: *Deleted

## 2012-10-15 ENCOUNTER — Encounter: Payer: Medicare Other | Attending: Internal Medicine | Admitting: *Deleted

## 2012-10-15 DIAGNOSIS — E669 Obesity, unspecified: Secondary | ICD-10-CM | POA: Insufficient documentation

## 2012-10-15 DIAGNOSIS — Z713 Dietary counseling and surveillance: Secondary | ICD-10-CM | POA: Insufficient documentation

## 2012-10-15 NOTE — Progress Notes (Signed)
  Medical Nutrition Therapy:  Appt start time: 1630 end time:  1700.  Assessment:  Primary concerns today: patient here for obesity follow up visit. Has decided to eat all baked foods. Was eating 3 meals but got tired of that so went back to 1 meal a day and gained about 15 pounds. Now ready to go back to 3 meals a day. Now on diet pill of phentermine. Weight back down about 10 pounds but 3 pounds heavier than last visit in August.States she has signed up for the [facility] family plan but needs money to pay for it. Plans to go after kids get out of school each day @ 4 PM and plans to work on machines and walk the track. States she has switched from salt to Mrs. Dash and no longer adds mayo or salad dressing to foods. Has switched from whole to 2% milk.  MEDICATIONS: see list   DIETARY INTAKE:  Usual eating pattern includes 2 meals and 0 snacks per day.  Everyday foods include eggs, tuna and small amounts of carbohydrate foods.  Avoided foods include fried and other high fat foods, sweets.    24-hr recall:  B ( AM): none  Snk ( AM): none  L (12 PM): 2 eggs OR tuna with hot sauce OR PNB and jelly sandwich, water with Hydro cut powder weigh loss powder Snk ( PM): none D (8 PM): 2 eggs OR tuna with hot sauce OR sweetened cereal with 8 oz 2 % milk Snk ( PM): none Beverages: water with weight loss powder for flavored  Usual physical activity: none lately   Estimated energy needs: 1600 calories 180 g carbohydrates 120 g protein 44 g fat  Progress Towards Goal(s):  In progress.   Nutritional Diagnosis:  NI-1.5 Excessive energy intake As related to activity level.  As evidenced by BMI of 54.8.    Intervention:  Acknowledged the behavior changes she reported, explained rationale of not skipping meals once again, and encouraged her to plan to go to gym as planned even if she has to plan around MD appointments for the family. She has mixed emotions about losing weight as it will lead to her having a  skin surgery that she does not want. She is struggling with the health benefits and the surgery option.  Plan: Continue to aim for 3 Carb choices at each meal +/- 1 either way Aim for 3 meals a day Continue with plans to join [facility] and plan to go every afternoon at 4PM after kids are out of school Start with 15 minutes of activity each day and after 2 weeks increase as tolerated.  No new handouts given during this visit  Monitoring/Evaluation:  Dietary intake, exercise, reading food labels, and body weight in 5 weeks.

## 2012-10-15 NOTE — Patient Instructions (Addendum)
Plan: Continue to aim for 3 Carb choices at each meal +/- 1 either way Aim for 3 meals a day Continue with plans to join [facility] and plan to go every afternoon at 4PM after kids are out of school Start with 15 minutes of activity each day and after 2 weeks increase as tolerated.

## 2012-10-16 ENCOUNTER — Encounter: Payer: Self-pay | Admitting: *Deleted

## 2012-11-23 ENCOUNTER — Ambulatory Visit: Payer: Medicare Other | Admitting: *Deleted

## 2013-03-04 ENCOUNTER — Encounter (HOSPITAL_COMMUNITY): Payer: Self-pay | Admitting: *Deleted

## 2013-03-04 ENCOUNTER — Emergency Department (INDEPENDENT_AMBULATORY_CARE_PROVIDER_SITE_OTHER)
Admission: EM | Admit: 2013-03-04 | Discharge: 2013-03-04 | Disposition: A | Discharge status: Home / Self Care | Payer: Medicare Other | Source: Home / Self Care | Attending: Emergency Medicine | Admitting: Emergency Medicine

## 2013-03-04 DIAGNOSIS — J020 Streptococcal pharyngitis: Secondary | ICD-10-CM

## 2013-03-04 MED ORDER — CEPHALEXIN 500 MG PO CAPS: 500.0000 mg | ORAL_CAPSULE | Freq: Four times a day (QID) | ORAL | Status: DC

## 2013-03-04 NOTE — ED Notes (Addendum)
C/o sore throat onset 2 mos. ago. States it goes away and comes back.  It came back 2 weeks ago. C/o laryngititis, and taste buds on back of tongue hurt when she swallows. Has chills and gets hot. Temp was 99.6 when she checked it last weekend.  C/o feeling lightheaded and everything starts spinning.  Had a cold last week and saw Dr. Concepcion Elk and he gave her a Z-pack for bronchitis.  She got a chest xray.

## 2013-03-24 NOTE — ED Provider Notes (Signed)
History     CSN: [id number]  Arrival date & time 03/04/13  1634   First MD Initiated Contact with Patient 03/04/13 1706      Chief Complaint  Patient presents with  . Sore Throat    (Consider location/radiation/quality/duration/timing/severity/associated sxs/prior treatment) HPI  Past Medical History  Diagnosis Date  . Hydradenitis   . Asthma   . Obesity     Past Surgical History  Procedure Laterality Date  . Cesarean section      x2  . Wisdom tooth extraction  2011    Family History  Problem Relation Age of Onset  . Asthma Other   . Hyperlipidemia Other   . Hypertension Other   . Diabetes Mother   . Hypertension Mother   . Diabetes Father     History  Substance Use Topics  . Smoking status: Current Some Day Smoker -- 0.33 packs/day    Types: Cigarettes  . Smokeless tobacco: Never Used  . Alcohol Use: Yes     Comment: once a month at a party beer wine or mixed drinks    OB History   Grav Para Term Preterm Abortions TAB SAB Ect Mult Living                  Review of Systems  Allergies  Review of patient's allergies indicates no known allergies.  Home Medications   Current Outpatient Rx  Name  Route  Sig  Dispense  Refill  . Albuterol (PROVENTIL IN)   Inhalation   Inhale into the lungs.         . cromolyn (OPTICROM) 4 % ophthalmic solution   Both Eyes   Place 1 drop into both eyes 4 (four) times daily.   10 mL   12   . olopatadine (PATANOL) 0.1 % ophthalmic solution   Both Eyes   Place 1 drop into both eyes 2 (two) times daily.   5 mL   12   . phentermine 37.5 MG capsule   Oral   Take 37.5 mg by mouth every morning.         . cephALEXin (KEFLEX) 500 MG capsule   Oral   Take 1 capsule (500 mg total) by mouth 4 (four) times daily.   40 capsule   0   . DOXYCYCLINE HYCLATE PO   Oral   Take by mouth.         Marland Kitchen HYDROCODONE-ACETAMINOPHEN PO   Oral   Take by mouth.           LMP 02/26/2013  Physical Exam  ED Course   Procedures (including critical care time)  Labs Reviewed  POCT RAPID STREP A ([facility] ONLY) - Abnormal; Notable for the following:    Streptococcus, Group A Screen (Direct) POSITIVE (*)    All other components within normal limits   No results found.   1. Strep pharyngitis       MDM          Duwayne Heck de Marcello Moores, MD 03/24/13 1640

## 2013-09-15 ENCOUNTER — Encounter (HOSPITAL_COMMUNITY): Payer: Self-pay | Admitting: Emergency Medicine

## 2013-09-15 ENCOUNTER — Emergency Department (HOSPITAL_COMMUNITY)
Admission: EM | Admit: 2013-09-15 | Discharge: 2013-09-15 | Disposition: A | Discharge status: Home / Self Care | Payer: Medicare Other | Attending: Emergency Medicine | Admitting: Emergency Medicine

## 2013-09-15 DIAGNOSIS — F17200 Nicotine dependence, unspecified, uncomplicated: Secondary | ICD-10-CM | POA: Insufficient documentation

## 2013-09-15 DIAGNOSIS — M25561 Pain in right knee: Secondary | ICD-10-CM

## 2013-09-15 DIAGNOSIS — Z79899 Other long term (current) drug therapy: Secondary | ICD-10-CM | POA: Insufficient documentation

## 2013-09-15 DIAGNOSIS — Z791 Long term (current) use of non-steroidal anti-inflammatories (NSAID): Secondary | ICD-10-CM | POA: Insufficient documentation

## 2013-09-15 DIAGNOSIS — Z872 Personal history of diseases of the skin and subcutaneous tissue: Secondary | ICD-10-CM | POA: Insufficient documentation

## 2013-09-15 DIAGNOSIS — S8990XA Unspecified injury of unspecified lower leg, initial encounter: Secondary | ICD-10-CM | POA: Insufficient documentation

## 2013-09-15 DIAGNOSIS — J45909 Unspecified asthma, uncomplicated: Secondary | ICD-10-CM | POA: Insufficient documentation

## 2013-09-15 DIAGNOSIS — S99919A Unspecified injury of unspecified ankle, initial encounter: Secondary | ICD-10-CM | POA: Insufficient documentation

## 2013-09-15 DIAGNOSIS — IMO0002 Reserved for concepts with insufficient information to code with codable children: Secondary | ICD-10-CM | POA: Insufficient documentation

## 2013-09-15 DIAGNOSIS — X500XXA Overexertion from strenuous movement or load, initial encounter: Secondary | ICD-10-CM | POA: Insufficient documentation

## 2013-09-15 DIAGNOSIS — Y929 Unspecified place or not applicable: Secondary | ICD-10-CM | POA: Insufficient documentation

## 2013-09-15 DIAGNOSIS — Y9389 Activity, other specified: Secondary | ICD-10-CM | POA: Insufficient documentation

## 2013-09-15 MED ORDER — IBUPROFEN 400 MG PO TABS
800.0000 mg | ORAL_TABLET | Freq: Once | ORAL | Status: AC
  Administered 2013-09-15: 800 mg via ORAL
  Filled 2013-09-15: qty 2

## 2013-09-15 MED ORDER — IBUPROFEN 800 MG PO TABS: 800.0000 mg | ORAL_TABLET | Freq: Three times a day (TID) | ORAL | Status: DC

## 2013-09-15 NOTE — ED Notes (Signed)
Presents with right and left knee pain. Right knee pain began today while playing with cat and pt heard a pop, left knee is sore from bearing more weight on left side. Cms intact

## 2013-09-15 NOTE — ED Provider Notes (Signed)
CSN: [id number]     Arrival date & time 09/15/13  2120 History   First MD Initiated Contact with Patient 09/15/13 2204     Chief Complaint  Patient presents with  . Knee Pain   (Consider location/radiation/quality/duration/timing/severity/associated sxs/prior Treatment) HPI Comments: The patient is a 37 year old female with a past medical history of Obesity and Asthma presents to the ED with right knee pain for 1 day. She reports playing with her 38 month old kitten when she almost fell and caught herself on the wall with her hand and "heard a pop" in her right knee.  She reports mild pain 4/10 that is aggravated by stairs and movement.  She reports taking OTC pain medications and icing the affected area without relief.  She states that the leg has "given out on her" a few times.  Also reports mild pain in Right ankle. She was able to ambulate after the injury. Denies paresthesias, or numbness.      Patient is a 37 y.o. female presenting with knee pain. The history is provided by the patient.  Knee Pain   Past Medical History  Diagnosis Date  . Hydradenitis   . Asthma   . Obesity    Past Surgical History  Procedure Laterality Date  . Cesarean section      x2  . Wisdom tooth extraction  2011   Family History  Problem Relation Age of Onset  . Asthma Other   . Hyperlipidemia Other   . Hypertension Other   . Diabetes Mother   . Hypertension Mother   . Diabetes Father    History  Substance Use Topics  . Smoking status: Current Some Day Smoker -- 0.33 packs/day    Types: Cigarettes  . Smokeless tobacco: Never Used  . Alcohol Use: Yes     Comment: once a month at a party beer wine or mixed drinks   OB History   Grav Para Term Preterm Abortions TAB SAB Ect Mult Living                 Review of Systems  All other systems reviewed and are negative.    Allergies  Review of patient's allergies indicates no known allergies.  Home Medications   Current Outpatient Rx    Name  Route  Sig  Dispense  Refill  . Albuterol (PROVENTIL IN)   Inhalation   Inhale into the lungs.         . fluticasone (FLONASE) 50 MCG/ACT nasal spray   Nasal   Place 2 sprays into the nose daily.         Marland Kitchen HYDROCODONE-ACETAMINOPHEN PO   Oral   Take by mouth.         Marland Kitchen olopatadine (PATANOL) 0.1 % ophthalmic solution   Both Eyes   Place 1 drop into both eyes 2 (two) times daily.         . phentermine 37.5 MG capsule   Oral   Take 37.5 mg by mouth every morning.         Marland Kitchen ibuprofen (ADVIL,MOTRIN) 800 MG tablet   Oral   Take 1 tablet (800 mg total) by mouth 3 (three) times daily.   21 tablet   0    BP 143/90  Pulse 92  Temp(Src) 98.2 F (36.8 C) (Oral)  Resp 22  SpO2 100% Physical Exam  Nursing note and vitals reviewed. Constitutional: She appears well-developed and well-nourished. No distress.  Patient is a morbidly obese female  HENT:  Head: Normocephalic and atraumatic.  Eyes: EOM are normal.  Neck: Neck supple.  Cardiovascular: Normal rate and regular rhythm.   Pulmonary/Chest: Effort normal and breath sounds normal. She has no wheezes.  Abdominal: Soft. Normal appearance. There is no tenderness.  Musculoskeletal: Normal range of motion. She exhibits no edema.       Right knee: She exhibits normal range of motion, no swelling, no effusion, no ecchymosis, no deformity, no laceration, no erythema, normal alignment, no LCL laxity, normal patellar mobility and no MCL laxity. Tenderness found. Medial joint line tenderness noted.       Right ankle: Normal. She exhibits normal range of motion, no swelling, no ecchymosis, no deformity, no laceration and normal pulse. No AITFL tenderness found.       Legs: Full passive ROM with Right knee, reports pain with movement. Full ROM without pain with Right ankle.  NV intact.   Neurological: She is alert. She has normal strength. No sensory deficit.  Skin: Skin is warm and dry.  Psychiatric: Her behavior is  normal.    ED Course  Procedures (including critical care time) Labs Review Labs Reviewed - No data to display Imaging Review No results found.  EKG Interpretation   None       MDM   1. Knee pain, right    The patient is an obese female presenting to the ED with Right knee pain.  The physical exam is without swelling, ecchymosis, joint effusion, or decreased ROM. Patient reports she was able to ambulate throughout the day. Offered to X-ray her knee, but given history and lack of trauma the patient decided to follow up with ortho for further treatment if pain did not subside within a few days. Patient is stable for discharge.  Meds given in ED:  Medications  ibuprofen (ADVIL,MOTRIN) tablet 800 mg (800 mg Oral Given 09/15/13 2224)    Discharge Medication List as of 09/15/2013 10:25 PM    START taking these medications   Details  ibuprofen (ADVIL,MOTRIN) 800 MG tablet Take 1 tablet (800 mg total) by mouth 3 (three) times daily., Starting 09/15/2013, Until Discontinued, Print            Clabe Seal, PA-C 09/17/13 1426

## 2013-09-20 NOTE — ED Provider Notes (Signed)
Medical screening examination/treatment/procedure(s) were performed by non-physician practitioner and as supervising physician I was immediately available for consultation/collaboration.    Jennette Leask S Kikuye Korenek, MD 09/20/13 1636 

## 2014-12-15 ENCOUNTER — Other Ambulatory Visit: Payer: Self-pay | Admitting: Orthopedic Surgery

## 2014-12-15 DIAGNOSIS — M25561 Pain in right knee: Secondary | ICD-10-CM

## 2014-12-25 ENCOUNTER — Ambulatory Visit
Admission: RE | Admit: 2014-12-25 | Discharge: 2014-12-25 | Disposition: A | Discharge status: Home / Self Care | Payer: Medicare Other | Source: Ambulatory Visit | Attending: Orthopedic Surgery | Admitting: Orthopedic Surgery

## 2014-12-25 DIAGNOSIS — M25561 Pain in right knee: Secondary | ICD-10-CM

## 2015-02-08 ENCOUNTER — Other Ambulatory Visit: Payer: Self-pay | Admitting: Obstetrics

## 2015-02-08 DIAGNOSIS — L732 Hidradenitis suppurativa: Secondary | ICD-10-CM

## 2015-02-15 ENCOUNTER — Ambulatory Visit
Admission: RE | Admit: 2015-02-15 | Discharge: 2015-02-15 | Disposition: A | Discharge status: Home / Self Care | Payer: Medicare Other | Source: Ambulatory Visit | Attending: Obstetrics | Admitting: Obstetrics

## 2015-02-15 DIAGNOSIS — L732 Hidradenitis suppurativa: Secondary | ICD-10-CM

## 2016-07-02 ENCOUNTER — Ambulatory Visit (HOSPITAL_COMMUNITY)
Admission: EM | Admit: 2016-07-02 | Discharge: 2016-07-02 | Disposition: A | Discharge status: Home / Self Care | Payer: Medicare Other | Attending: Family Medicine | Admitting: Family Medicine

## 2016-07-02 ENCOUNTER — Encounter (HOSPITAL_COMMUNITY): Payer: Self-pay | Admitting: Emergency Medicine

## 2016-07-02 DIAGNOSIS — L732 Hidradenitis suppurativa: Secondary | ICD-10-CM | POA: Diagnosis not present

## 2016-07-02 DIAGNOSIS — Z833 Family history of diabetes mellitus: Secondary | ICD-10-CM | POA: Insufficient documentation

## 2016-07-02 DIAGNOSIS — Z8249 Family history of ischemic heart disease and other diseases of the circulatory system: Secondary | ICD-10-CM | POA: Insufficient documentation

## 2016-07-02 DIAGNOSIS — F17210 Nicotine dependence, cigarettes, uncomplicated: Secondary | ICD-10-CM | POA: Diagnosis not present

## 2016-07-02 DIAGNOSIS — Z202 Contact with and (suspected) exposure to infections with a predominantly sexual mode of transmission: Secondary | ICD-10-CM | POA: Insufficient documentation

## 2016-07-02 MED ORDER — PENICILLIN G BENZATHINE 1200000 UNIT/2ML IM SUSP
INTRAMUSCULAR | Status: AC
  Filled 2016-07-02: qty 2

## 2016-07-02 MED ORDER — PENICILLIN G BENZATHINE 1200000 UNIT/2ML IM SUSP
2.4000 10*6.[IU] | Freq: Once | INTRAMUSCULAR | Status: AC
Start: 2016-07-02 — End: 2016-07-02
  Administered 2016-07-02: 2.4 10*6.[IU] via INTRAMUSCULAR

## 2016-07-02 MED ORDER — DOXYCYCLINE HYCLATE 100 MG PO CAPS: 100.0000 mg | ORAL_CAPSULE | Freq: Two times a day (BID) | ORAL | 0 refills | Status: DC

## 2016-07-02 NOTE — ED Notes (Signed)
Verified phone number in demographics

## 2016-07-02 NOTE — ED Triage Notes (Signed)
Patient denies any symptoms, no urinary symptoms, no vaginal discharge.  Patient reports partner is positive for syphillis

## 2016-07-03 LAB — HIV ANTIBODY (ROUTINE TESTING W REFLEX): HIV Screen 4th Generation wRfx: NONREACTIVE

## 2016-07-03 LAB — CERVICOVAGINAL ANCILLARY ONLY
Chlamydia: NEGATIVE
Neisseria Gonorrhea: NEGATIVE
Wet Prep (BD Affirm): POSITIVE — AB

## 2016-07-03 LAB — RPR: RPR Ser Ql: NONREACTIVE

## 2016-07-04 ENCOUNTER — Telehealth: Payer: Self-pay | Admitting: Internal Medicine

## 2016-07-04 MED ORDER — FLUCONAZOLE 150 MG PO TABS: 150.0000 mg | ORAL_TABLET | Freq: Once | ORAL | 0 refills | Status: AC

## 2016-07-04 NOTE — Telephone Encounter (Signed)
Clinical staff, please let patient know that test for candida (yeast) was positive.   Rx fluconazole [facility] [address]   Recheck or followup PCP/Edwin Avbuere for persistent symptoms.  LM

## 2016-07-08 NOTE — ED Provider Notes (Signed)
CSN: [id number]     Arrival date & time 07/02/16  1120 History   First MD Initiated Contact with Patient 07/02/16 1221     Chief Complaint  Patient presents with  . Exposure to STD   (Consider location/radiation/quality/duration/timing/severity/associated sxs/prior Treatment) Patient presents with c/o being exposed to syphillis.  She states her boyfriend has syphillis and she has been having unprotected sex.  She is having a outbreak of hydradenitis supparativa.   The history is provided by the patient.  Exposure to STD  This is a new problem. The current episode started more than 1 week ago. The problem occurs constantly. The problem has not changed since onset.Pertinent negatives include no chest pain. The symptoms are aggravated by intercourse. She has tried nothing for the symptoms.    Past Medical History:  Diagnosis Date  . Asthma   . Hydradenitis   . Obesity    Past Surgical History:  Procedure Laterality Date  . CESAREAN SECTION     x2  . WISDOM TOOTH EXTRACTION  2011   Family History  Problem Relation Age of Onset  . Diabetes Mother   . Hypertension Mother   . Diabetes Father   . Asthma Other   . Hyperlipidemia Other   . Hypertension Other    Social History  Substance Use Topics  . Smoking status: Current Some Day Smoker    Packs/day: 0.33    Types: Cigarettes  . Smokeless tobacco: Never Used  . Alcohol use Yes     Comment: once a month at a party beer wine or mixed drinks   OB History    No data available     Review of Systems  Constitutional: Negative.   HENT: Negative.   Eyes: Negative.   Respiratory: Negative.   Cardiovascular: Negative.  Negative for chest pain.  Gastrointestinal: Negative.   Endocrine: Negative.   Genitourinary: Negative.   Musculoskeletal: Negative.   Skin: Positive for wound.  Allergic/Immunologic: Negative.   Neurological: Negative.   Hematological: Negative.   Psychiatric/Behavioral: Negative.     Allergies  Review  of patient's allergies indicates no known allergies.  Home Medications   Prior to Admission medications   Medication Sig Start Date End Date Taking? Authorizing Provider  Albuterol (PROVENTIL IN) Inhale into the lungs.    Historical Provider, MD  doxycycline (VIBRAMYCIN) 100 MG capsule Take 1 capsule (100 mg total) by mouth 2 (two) times daily. 07/02/16   Deatra CanterWilliam J Laderius Valbuena, FNP  fluticasone (FLONASE) 50 MCG/ACT nasal spray Place 2 sprays into the nose daily.    Historical Provider, MD  HYDROCODONE-ACETAMINOPHEN PO Take by mouth.    Historical Provider, MD  ibuprofen (ADVIL,MOTRIN) 800 MG tablet Take 1 tablet (800 mg total) by mouth 3 (three) times daily. 09/15/13   Mellody DrownLauren Parker, PA-C  olopatadine (PATANOL) 0.1 % ophthalmic solution Place 1 drop into both eyes 2 (two) times daily.    Historical Provider, MD  phentermine 37.5 MG capsule Take 37.5 mg by mouth every morning.    Historical Provider, MD   Meds Ordered and Administered this Visit   Medications  penicillin g benzathine (BICILLIN LA) 1200000 UNIT/2ML injection 2.4 Million Units (2.4 Million Units Intramuscular Given 07/02/16 1249)    BP 146/86 (BP Location: Right Arm) Comment (BP Location): large cuff  Pulse 80   Temp 98.6 F (37 C) (Oral)   Resp 22   SpO2 99%  No data found.   Physical Exam  Constitutional: She appears well-developed and well-nourished.  HENT:  Head: Normocephalic and atraumatic.  Right Ear: External ear normal.  Left Ear: External ear normal.  Eyes: EOM are normal. Pupils are equal, round, and reactive to light.  Neck: Normal range of motion.  Cardiovascular: Normal rate.   Pulmonary/Chest: Effort normal.  Abdominal: Soft.  Skin:  Cellulitis bilataral axilla and abdominal folds  Nursing note and vitals reviewed.   Urgent Care Course   Clinical Course    Procedures (including critical care time)  Labs Review Labs Reviewed  CERVICOVAGINAL ANCILLARY ONLY - Abnormal; Notable for the following:        Result Value   Wet Prep (BD Affirm) **POSITIVE for Candida** (*)    All other components within normal limits  RPR  HIV ANTIBODY (ROUTINE TESTING)  CERVICOVAGINAL ANCILLARY ONLY    Imaging Review No results found.   Visual Acuity Review  Right Eye Distance:   Left Eye Distance:   Bilateral Distance:    Right Eye Near:   Left Eye Near:    Bilateral Near:         MDM   1. STD exposure   2. Hidradenitis suppurativa    PCN 2.4 million units  IM x 1 for exposure to Syphillis Doxycycline 100mg  one po bid x 10 days #20    Deatra CanterWilliam J Corrissa Martello, FNP 07/08/16 2133

## 2016-07-10 ENCOUNTER — Encounter (HOSPITAL_COMMUNITY): Payer: Self-pay | Admitting: *Deleted

## 2016-07-10 ENCOUNTER — Ambulatory Visit (HOSPITAL_COMMUNITY)
Admission: EM | Admit: 2016-07-10 | Discharge: 2016-07-10 | Disposition: A | Discharge status: Home / Self Care | Payer: Medicare Other | Attending: Family Medicine | Admitting: Family Medicine

## 2016-07-10 DIAGNOSIS — H65193 Other acute nonsuppurative otitis media, bilateral: Secondary | ICD-10-CM

## 2016-07-10 MED ORDER — FLUTICASONE PROPIONATE 50 MCG/ACT NA SUSP: 2.0000 | Freq: Every day | NASAL | 0 refills | Status: DC

## 2016-07-10 MED ORDER — OXYMETAZOLINE HCL 0.05 % NA SOLN: 1.0000 | Freq: Two times a day (BID) | NASAL | 0 refills | Status: DC

## 2016-07-10 NOTE — ED Provider Notes (Signed)
[facility]    CSN: [id number] Arrival date & time: 07/10/16  [id number]  First Provider Contact:  First MD Initiated Contact with Patient 07/10/16 1012     History   Chief Complaint Chief Complaint  Patient presents with  . Otalgia   HPI [name] is a 40 y.o. female presenting for ear pain.   She reports 1 week of fullness/itching in the right ear with pressure-type pain starting yesterday. No dizziness, change in hearing, fever, sore throat, cough, rhinorrhea. + congestion.   She was evaluated here yesterday for syphilis exposure, given PCN IM and also doxycycline for hidradenitis flare. Tolerating this well.   Past Medical History:  Diagnosis Date  . Asthma   . Hydradenitis   . Obesity     There are no active problems to display for this patient.   Past Surgical History:  Procedure Laterality Date  . CESAREAN SECTION     x2  . WISDOM TOOTH EXTRACTION  2011    OB History    No data available       Home Medications    Prior to Admission medications   Medication Sig Start Date End Date Taking? Authorizing Provider  Albuterol (PROVENTIL IN) Inhale into the lungs.    Historical Provider, MD  doxycycline (VIBRAMYCIN) 100 MG capsule Take 1 capsule (100 mg total) by mouth 2 (two) times daily. 07/02/16   Deatra CanterWilliam J Oxford, FNP  fluticasone (FLONASE) 50 MCG/ACT nasal spray Place 2 sprays into both nostrils daily. 07/10/16   Tyrone Nineyan B Kahron Kauth, MD  HYDROCODONE-ACETAMINOPHEN PO Take by mouth.    Historical Provider, MD  ibuprofen (ADVIL,MOTRIN) 800 MG tablet Take 1 tablet (800 mg total) by mouth 3 (three) times daily. 09/15/13   Mellody DrownLauren Parker, PA-C  olopatadine (PATANOL) 0.1 % ophthalmic solution Place 1 drop into both eyes 2 (two) times daily.    Historical Provider, MD  oxymetazoline (AFRIN) 0.05 % nasal spray Place 1 spray into both nostrils 2 (two) times daily. for no more than 5 days 07/10/16   Tyrone Nineyan B Alayiah Fontes, MD  phentermine 37.5 MG capsule Take 37.5 mg by mouth  every morning.    Historical Provider, MD    Family History Family History  Problem Relation Age of Onset  . Diabetes Mother   . Hypertension Mother   . Diabetes Father   . Asthma Other   . Hyperlipidemia Other   . Hypertension Other     Social History Social History  Substance Use Topics  . Smoking status: Current Some Day Smoker    Packs/day: 0.33    Types: Cigarettes  . Smokeless tobacco: Never Used  . Alcohol use Yes     Comment: once a month at a party beer wine or mixed drinks     Allergies   Review of patient's allergies indicates no known allergies.   Review of Systems Review of Systems As above  Physical Exam Triage Vital Signs ED Triage Vitals  Enc Vitals Group     BP      Pulse      Resp      Temp      Temp src      SpO2      Weight      Height      Head Circumference      Peak Flow      Pain Score      Pain Loc      Pain Edu?  Excl. in GC?    No data found.   Updated Vital Signs BP 120/80 (BP Location: Right Arm)   Pulse 78   Temp 98.6 F (37 C) (Oral)   Resp 18   LMP 07/10/2016   SpO2 99%   Physical Exam  Constitutional: She is oriented to person, place, and time. She appears well-developed and well-nourished. No distress.  Eyes: EOM are normal. Pupils are equal, round, and reactive to light. No scleral icterus.  Neck: Neck supple. No JVD present.  Cardiovascular: Normal rate, regular rhythm, normal heart sounds and intact distal pulses.   No murmur heard. Pulmonary/Chest: Effort normal and breath sounds normal. No respiratory distress.  Abdominal: Soft. Bowel sounds are normal. She exhibits no distension. There is no tenderness.  Musculoskeletal: Normal range of motion. She exhibits no edema or tenderness.  Lymphadenopathy:    She has no cervical adenopathy.  Neurological: She is alert and oriented to person, place, and time. She exhibits normal muscle tone.  Skin: Skin is warm and dry. Capillary refill takes less than 2  seconds.  Vitals reviewed.  UC Treatments / Results  Labs (all labs ordered are listed, but only abnormal results are displayed) Labs Reviewed - No data to display  EKG  EKG Interpretation None       Radiology No results found.  Procedures Procedures (including critical care time)  Medications Ordered in UC Medications - No data to display  Initial Impression / Assessment and Plan / UC Course  I have reviewed the triage vital signs and the nursing notes.  Pertinent labs & imaging results that were available during my care of the patient were reviewed by me and considered in my medical decision making (see chart for details).  Final Clinical Impressions(s) / UC Diagnoses   Final diagnoses:  Acute middle ear effusion, bilateral   40 y.o. female with bilateral effusions related to h/o allergic rhinitis +/- viral URI with congestion. Will Rx IN steroid and short course decongestant. Continue other treatments as directed and follow up if fever develops or with PCP as needed.   New Prescriptions New Prescriptions   OXYMETAZOLINE (AFRIN) 0.05 % NASAL SPRAY    Place 1 spray into both nostrils 2 (two) times daily. for no more than 5 days     Tyrone Nineyan B Pati Thinnes, MD 07/10/16 [phone]

## 2016-07-10 NOTE — Discharge Instructions (Signed)
Your symptom is due to fluid backing up into your ears. This is likely to be from a virus or allergies. Use the nose spray to loosen the path for the fluid in your ears to drain. Use AFRIN only for the next 5 days and continue FLONASE as directed.

## 2016-07-10 NOTE — ED Triage Notes (Signed)
Pt  Reports    Symptoms  Of       r   Earache   Since  Yesterday  She  Reports  A  Sensation    Of  Ear  Irritation    X  1  Week

## 2017-03-28 ENCOUNTER — Other Ambulatory Visit: Payer: Self-pay | Admitting: Internal Medicine

## 2017-04-09 ENCOUNTER — Other Ambulatory Visit: Payer: Self-pay | Admitting: Internal Medicine

## 2017-04-09 DIAGNOSIS — Z1231 Encounter for screening mammogram for malignant neoplasm of breast: Secondary | ICD-10-CM

## 2017-04-19 ENCOUNTER — Encounter (HOSPITAL_COMMUNITY): Payer: Self-pay

## 2017-04-19 ENCOUNTER — Emergency Department (HOSPITAL_COMMUNITY)
Admission: EM | Admit: 2017-04-19 | Discharge: 2017-04-20 | Disposition: A | Discharge status: Home / Self Care | Payer: Medicare Other | Attending: Emergency Medicine | Admitting: Emergency Medicine

## 2017-04-19 DIAGNOSIS — T7840XA Allergy, unspecified, initial encounter: Secondary | ICD-10-CM | POA: Insufficient documentation

## 2017-04-19 MED ORDER — DIPHENHYDRAMINE HCL 50 MG/ML IJ SOLN
50.0000 mg | Freq: Once | INTRAMUSCULAR | Status: AC
  Administered 2017-04-19: 50 mg via INTRAVENOUS
  Filled 2017-04-19: qty 1

## 2017-04-19 MED ORDER — EPINEPHRINE 0.3 MG/0.3ML IJ SOAJ
0.3000 mg | Freq: Once | INTRAMUSCULAR | Status: AC
  Administered 2017-04-19: 0.3 mg via INTRAMUSCULAR
  Filled 2017-04-19: qty 0.3

## 2017-04-19 MED ORDER — METHYLPREDNISOLONE SODIUM SUCC 125 MG IJ SOLR
125.0000 mg | Freq: Once | INTRAMUSCULAR | Status: AC
  Administered 2017-04-19: 125 mg via INTRAVENOUS
  Filled 2017-04-19: qty 2

## 2017-04-19 MED ORDER — FAMOTIDINE IN NACL 20-0.9 MG/50ML-% IV SOLN
20.0000 mg | Freq: Once | INTRAVENOUS | Status: AC
Start: 2017-04-19 — End: 2017-04-20
  Administered 2017-04-19: 20 mg via INTRAVENOUS
  Filled 2017-04-19: qty 50

## 2017-04-19 MED ORDER — SODIUM CHLORIDE 0.9 % IV BOLUS (SEPSIS)
1000.0000 mL | Freq: Once | INTRAVENOUS | Status: AC
  Administered 2017-04-19: 1000 mL via INTRAVENOUS

## 2017-04-19 NOTE — ED Provider Notes (Signed)
[facility]-EMERGENCY DEPT Provider Note   CSN: [id number] Arrival date & time: 04/19/17  2306  By signing my name below, I, [name], attest that this documentation has been prepared under the direction and in the presence of physician practitioner, Jacque Garrels, Mayer Masker, MD. Electronically Signed: Linna Combs, Scribe. 04/19/2017. 11:35 PM.  History   Chief Complaint Chief Complaint  Patient presents with  . Allergic Reaction   The history is provided by the patient. No language interpreter was used.    HPI Comments: [name] is a 41 y.o. female with PMHx including asthma who presents to the Emergency Department for evaluation of an allergic reaction beginning earlier tonight. She states she ate shrimp for the first time in a few years tonight and developed some lip swelling and an itching sensation in her throat shortly thereafter. She also notes a hives-like rash to her back and some cervical adenopathy that have developed since eating the shrimp. Patient has never had a prior allergic reaction to shrimp. No other new or infrequently eaten foods tonight. No recent new medications. Patient denies dysphagia, dyspnea, nausea, vomiting, or any other associated symptoms.  Past Medical History:  Diagnosis Date  . Asthma   . Hydradenitis   . Obesity     There are no active problems to display for this patient.   Past Surgical History:  Procedure Laterality Date  . CESAREAN SECTION     x2  . WISDOM TOOTH EXTRACTION  2011    OB History    No data available       Home Medications    Prior to Admission medications   Medication Sig Start Date End Date Taking? Authorizing Provider  Albuterol (PROVENTIL IN) Inhale into the lungs.    [provider]  diphenhydrAMINE (BENADRYL) 25 mg capsule Take one tablet every 6 hours x 3 days, then as needed 04/20/17   Elpidio Anis, PA-C  doxycycline (VIBRAMYCIN) 100 MG capsule Take 1 capsule (100 mg total) by mouth 2 (two)  times daily. 07/02/16   Deatra Canter, FNP  EPINEPHrine 0.3 mg/0.3 mL IJ SOAJ injection Inject 0.3 mLs (0.3 mg total) into the muscle once. 04/20/17 04/20/17  Elpidio Anis, PA-C  famotidine (PEPCID) 20 MG tablet One tablet twice daily for 3 days, then as needed. 04/20/17   Elpidio Anis, PA-C  fluticasone (FLONASE) 50 MCG/ACT nasal spray Place 2 sprays into both nostrils daily. 07/10/16   Tyrone Nine, MD  HYDROCODONE-ACETAMINOPHEN PO Take by mouth.    [provider]  ibuprofen (ADVIL,MOTRIN) 800 MG tablet Take 1 tablet (800 mg total) by mouth 3 (three) times daily. 09/15/13   Mellody Drown, PA-C  olopatadine (PATANOL) 0.1 % ophthalmic solution Place 1 drop into both eyes 2 (two) times daily.    [provider]  oxymetazoline (AFRIN) 0.05 % nasal spray Place 1 spray into both nostrils 2 (two) times daily. for no more than 5 days 07/10/16   Tyrone Nine, MD  phentermine 37.5 MG capsule Take 37.5 mg by mouth every morning.    [provider]  predniSONE (DELTASONE) 10 MG tablet Take 6 on day 1 Take 5 on day 2 Take 4 on day 3 Take 3 on day 4 Take 2 on day 5 Take 1 on day 6 04/20/17   Elpidio Anis, PA-C    Family History Family History  Problem Relation Age of Onset  . Diabetes Mother   . Hypertension Mother   . Diabetes Father   .  Asthma Other   . Hyperlipidemia Other   . Hypertension Other     Social History Social History  Substance Use Topics  . Smoking status: Current Some Day Smoker    Packs/day: 0.33    Types: Cigarettes  . Smokeless tobacco: Never Used  . Alcohol use Yes     Comment: once a month at a party beer wine or mixed drinks     Allergies   Patient has no known allergies.   Review of Systems Review of Systems  HENT: Positive for facial swelling. Negative for trouble swallowing.   Respiratory: Negative for shortness of breath.   Gastrointestinal: Negative for nausea and vomiting.  Skin: Positive for rash.  Hematological:  Positive for adenopathy.  All other systems reviewed and are negative.  Physical Exam Updated Vital Signs BP (!) 151/87   Pulse 88   Temp 99.6 F (37.6 C) (Oral)   Resp (!) 23   Ht 5\' 9"  (1.753 m)   Wt (!) 163.3 kg (360 lb)   SpO2 98%   BMI 53.16 kg/m   Physical Exam  Constitutional: She is oriented to person, place, and time.  Morbidly obese, no acute distress  HENT:  Head: Normocephalic and atraumatic.  Swelling noted to the upper lip, symmetric, swelling, posterior oropharynx clear without obvious swelling  Eyes: Pupils are equal, round, and reactive to light.  Neck: Normal range of motion. Neck supple.  Bilateral lymphadenopathy  Cardiovascular: Normal rate, regular rhythm and normal heart sounds.   Pulmonary/Chest: Effort normal. No respiratory distress. She has wheezes.  Abdominal: Soft. Bowel sounds are normal. There is no tenderness. There is no guarding.  Neurological: She is alert and oriented to person, place, and time.  Skin: Skin is warm and dry.  Hives most notably on the back, neck, and lower legs  Psychiatric: She has a normal mood and affect.  Nursing note and vitals reviewed.  ED Treatments / Results  Labs (all labs ordered are listed, but only abnormal results are displayed) Labs Reviewed - No data to display  EKG  EKG Interpretation None       Radiology No results found.  Procedures Procedures (including critical care time)  CRITICAL CARE Performed by: Shon BatonHORTON, Laterrance Nauta F   Total critical care time: 25 minutes  Critical care time was exclusive of separately billable procedures and treating other patients.  Critical care was necessary to treat or prevent imminent or life-threatening deterioration.  Critical care was time spent personally by me on the following activities: development of treatment plan with patient and/or surrogate as well as nursing, discussions with consultants, evaluation of patient's response to treatment, examination  of patient, obtaining history from patient or surrogate, ordering and performing treatments and interventions, ordering and review of laboratory studies, ordering and review of radiographic studies, pulse oximetry and re-evaluation of patient's condition.   DIAGNOSTIC STUDIES: Oxygen Saturation is 96% on RA, adequate by my interpretation.    COORDINATION OF CARE: 11:28 PM Discussed treatment plan with pt at bedside and pt agreed to plan.  Medications Ordered in ED Medications  diphenhydrAMINE (BENADRYL) injection 50 mg (50 mg Intravenous Given 04/19/17 2330)  methylPREDNISolone sodium succinate (SOLU-MEDROL) 125 mg/2 mL injection 125 mg (125 mg Intravenous Given 04/19/17 2330)  famotidine (PEPCID) IVPB 20 mg premix (0 mg Intravenous Stopped 04/20/17 0001)  sodium chloride 0.9 % bolus 1,000 mL (0 mLs Intravenous Stopped 04/20/17 0215)  EPINEPHrine (EPI-PEN) injection 0.3 mg (0.3 mg Intramuscular Given 04/19/17 2340)  Initial Impression / Assessment and Plan / ED Course  I have reviewed the triage vital signs and the nursing notes.  Pertinent labs & imaging results that were available during my care of the patient were reviewed by me and considered in my medical decision making (see chart for details).     Patient presents with likely allergic reaction to shrimp. No history of similar reaction in the past. Given multisystem involvement, patient was given Benadryl, Pepcid, Solu-Medrol, and epinephrine.  Will monitor.  11:53 PM: Patient states her lips do not feel tight after medications but they appear swollen from doorway.  2:00 AM:    Resting comfortably.  4:53 AM Patient reports complete resolution of symptoms. Rash resolved. Will discharge home with Pepcid, Benadryl, steroids, and EpiPen.  Final Clinical Impressions(s) / ED Diagnoses   Final diagnoses:  Allergic reaction, initial encounter    New Prescriptions Discharge Medication List as of 04/20/2017  4:44 AM    START  taking these medications   Details  diphenhydrAMINE (BENADRYL) 25 mg capsule Take one tablet every 6 hours x 3 days, then as needed, Print    EPINEPHrine 0.3 mg/0.3 mL IJ SOAJ injection Inject 0.3 mLs (0.3 mg total) into the muscle once., Starting Sun 04/20/2017, Print    famotidine (PEPCID) 20 MG tablet One tablet twice daily for 3 days, then as needed., Print    predniSONE (DELTASONE) 10 MG tablet Take 6 on day 1 Take 5 on day 2 Take 4 on day 3 Take 3 on day 4 Take 2 on day 5 Take 1 on day 6, Print       I personally performed the services described in this documentation, which was scribed in my presence. The recorded information has been reviewed and is accurate.    Shon Baton, MD 04/20/17 [phone]

## 2017-04-19 NOTE — ED Triage Notes (Signed)
Pt complaining of SOB and allergic reaction. Pt states ate shrimp this evening. Pt with throat and lip swelling. Pt wheezing at triage.

## 2017-04-20 DIAGNOSIS — T7840XA Allergy, unspecified, initial encounter: Secondary | ICD-10-CM | POA: Diagnosis not present

## 2017-04-20 MED ORDER — EPINEPHRINE 0.3 MG/0.3ML IJ SOAJ: 0.3000 mg | Freq: Once | INTRAMUSCULAR | 0 refills | Status: AC

## 2017-04-20 MED ORDER — DIPHENHYDRAMINE HCL 25 MG PO CAPS: ORAL_CAPSULE | ORAL | 0 refills | Status: DC

## 2017-04-20 MED ORDER — FAMOTIDINE 20 MG PO TABS: ORAL_TABLET | ORAL | 0 refills | Status: DC

## 2017-04-20 MED ORDER — PREDNISONE 10 MG PO TABS: ORAL_TABLET | ORAL | 0 refills | Status: DC

## 2017-04-20 NOTE — ED Provider Notes (Signed)
Patient re-evaluation after treatment for acute allergic reaction, including Epi. She states she feels significantly better and that her symptoms have totally resolved.   Discussed discharge, use of EpiPen, signs and symptoms that should prompt return to the emergency department. Patient discharged with husband.    Elpidio AnisUpstill, Levar Fayson, PA-C 04/20/17 [id number]    Shon BatonHorton, Courtney F, MD 04/20/17 [phone]

## 2017-08-05 ENCOUNTER — Encounter (HOSPITAL_COMMUNITY): Payer: Self-pay | Admitting: Emergency Medicine

## 2017-08-05 ENCOUNTER — Ambulatory Visit (INDEPENDENT_AMBULATORY_CARE_PROVIDER_SITE_OTHER): Payer: Medicare Other

## 2017-08-05 ENCOUNTER — Ambulatory Visit (HOSPITAL_COMMUNITY)
Admission: EM | Admit: 2017-08-05 | Discharge: 2017-08-05 | Disposition: A | Discharge status: Home / Self Care | Payer: Medicare Other | Attending: Emergency Medicine | Admitting: Emergency Medicine

## 2017-08-05 DIAGNOSIS — J181 Lobar pneumonia, unspecified organism: Secondary | ICD-10-CM | POA: Diagnosis not present

## 2017-08-05 DIAGNOSIS — J45901 Unspecified asthma with (acute) exacerbation: Secondary | ICD-10-CM

## 2017-08-05 DIAGNOSIS — J189 Pneumonia, unspecified organism: Secondary | ICD-10-CM

## 2017-08-05 MED ORDER — FLUCONAZOLE 150 MG PO TABS: 150.0000 mg | ORAL_TABLET | Freq: Once | ORAL | 1 refills | Status: AC

## 2017-08-05 MED ORDER — PREDNISONE 10 MG (21) PO TBPK: ORAL_TABLET | ORAL | 0 refills | Status: DC

## 2017-08-05 MED ORDER — AZITHROMYCIN 250 MG PO TABS: 250.0000 mg | ORAL_TABLET | Freq: Every day | ORAL | 0 refills | Status: DC

## 2017-08-05 MED ORDER — BENZONATATE 200 MG PO CAPS: 200.0000 mg | ORAL_CAPSULE | Freq: Three times a day (TID) | ORAL | 0 refills | Status: DC | PRN

## 2017-08-05 MED ORDER — IPRATROPIUM BROMIDE 0.06 % NA SOLN: 2.0000 | Freq: Four times a day (QID) | NASAL | 0 refills | Status: DC

## 2017-08-05 MED ORDER — ALBUTEROL SULFATE HFA 108 (90 BASE) MCG/ACT IN AERS: 1.0000 | INHALATION_SPRAY | Freq: Four times a day (QID) | RESPIRATORY_TRACT | 0 refills | Status: DC | PRN

## 2017-08-05 MED ORDER — AEROCHAMBER PLUS MISC: 2 refills | Status: AC

## 2017-08-05 NOTE — Discharge Instructions (Signed)
Finish the azithromycin. 2 puffs from your albuterol inhaler with a spacer every 4-6 hours as needed for coughing, wheezing, chest tightness. Finish the prednisone. I with Tessalon as needed for cough. Try the Atrovent nasal spray in addition to your Flonase.

## 2017-08-05 NOTE — ED Triage Notes (Signed)
PT reports cough for 1 month. PT reports cough is worse at night. PT reports asthma exacerbation as well.

## 2017-08-05 NOTE — ED Provider Notes (Signed)
HPI  SUBJECTIVE:  [name] is a 41 y.o. female who presents with cough for the past month worse particularly at night. She reports chest pain as soreness from the coughing. She reports chest tightness and shortness of breath when coughing. States that she is waking up at night coughing. She reports wheezing, and is needing her rescue inhaler asthma nebulizer daily at night. Normally uses as needed. Is also tried multiple over-the-counter prescription cough syrups, left over amoxicillin. The albuterol nebs help. No aggravating factors. Also reports nasal congestion, rhinorrhea, postnasal drip, maxillary sinus pressure but no pain for the past month and she is also reporting decreased exercise tolerance to 100 feet or so, nocturia. She denies posttussive emesis, unintentional weight loss or unintentional weight gain. No lower extremity edema. She normally sleeps sitting mostly upright. No PND, abdominal pain. No hemoptysis, surgery, immobilization, calf pain or swelling. She states this is feels identical to previous asthma exacerbations which happen around this time every year. No OCP use. She has a past medical history of morbid obesity, asthma, seasonal allergies for which she takes Flonase and allergy pills. She is not on any controller medications. She is a smoker. She has a history of GERD but is taking medications for this and states that she is asymptomatic. No history of diabetes, hypertension, CHF, sinusitis, obstructive sleep apnea, PE, DVT, hypercoagulability. LMP: 8/20. Denies possibility of being pregnant. ION:[id number], Dorma Russell, MD    Past Medical History:  Diagnosis Date  . Asthma   . Hydradenitis   . Obesity     Past Surgical History:  Procedure Laterality Date  . CESAREAN SECTION     x2  . WISDOM TOOTH EXTRACTION  2011    Family History  Problem Relation Age of Onset  . Diabetes Mother   . Hypertension Mother   . Diabetes Father   . Asthma Other   . Hyperlipidemia  Other   . Hypertension Other     Social History  Substance Use Topics  . Smoking status: Current Some Day Smoker    Packs/day: 0.33    Types: Cigarettes  . Smokeless tobacco: Never Used  . Alcohol use Yes     Comment: once a month at a party beer wine or mixed drinks    No current facility-administered medications for this encounter.   Current Outpatient Prescriptions:  .  albuterol (PROVENTIL HFA;VENTOLIN HFA) 108 (90 Base) MCG/ACT inhaler, Inhale 1-2 puffs into the lungs every 6 (six) hours as needed for wheezing or shortness of breath., Disp: 1 Inhaler, Rfl: 0 .  Albuterol (PROVENTIL IN), Inhale into the lungs., Disp: , Rfl:  .  azithromycin (ZITHROMAX) 250 MG tablet, Take 1 tablet (250 mg total) by mouth daily. 2 tabs po on day 1, 1 tab po on days 2-5, Disp: 6 tablet, Rfl: 0 .  benzonatate (TESSALON) 200 MG capsule, Take 1 capsule (200 mg total) by mouth 3 (three) times daily as needed for cough., Disp: 30 capsule, Rfl: 0 .  diphenhydrAMINE (BENADRYL) 25 mg capsule, Take one tablet every 6 hours x 3 days, then as needed, Disp: 30 capsule, Rfl: 0 .  doxycycline (VIBRAMYCIN) 100 MG capsule, Take 1 capsule (100 mg total) by mouth 2 (two) times daily., Disp: 20 capsule, Rfl: 0 .  famotidine (PEPCID) 20 MG tablet, One tablet twice daily for 3 days, then as needed., Disp: 20 tablet, Rfl: 0 .  fluconazole (DIFLUCAN) 150 MG tablet, Take 1 tablet (150 mg total) by mouth once. 1 tab po  x 1. May repeat in 72 hours if no improvement, Disp: 2 tablet, Rfl: 1 .  fluticasone (FLONASE) 50 MCG/ACT nasal spray, Place 2 sprays into both nostrils daily., Disp: 16 g, Rfl: 0 .  ibuprofen (ADVIL,MOTRIN) 800 MG tablet, Take 1 tablet (800 mg total) by mouth 3 (three) times daily., Disp: 21 tablet, Rfl: 0 .  ipratropium (ATROVENT) 0.06 % nasal spray, Place 2 sprays into both nostrils 4 (four) times daily. 3-4 times/ day, Disp: 15 mL, Rfl: 0 .  olopatadine (PATANOL) 0.1 % ophthalmic solution, Place 1 drop into  both eyes 2 (two) times daily., Disp: , Rfl:  .  oxymetazoline (AFRIN) 0.05 % nasal spray, Place 1 spray into both nostrils 2 (two) times daily. for no more than 5 days, Disp: 14.7 mL, Rfl: 0 .  phentermine 37.5 MG capsule, Take 37.5 mg by mouth every morning., Disp: , Rfl:  .  predniSONE (STERAPRED UNI-PAK 21 TAB) 10 MG (21) TBPK tablet, Dispense one 6 day pack. Take as directed with food., Disp: 21 tablet, Rfl: 0 .  Spacer/Aero-Holding Chambers (AEROCHAMBER PLUS) inhaler, Use as instructed, Disp: 1 each, Rfl: 2  No Known Allergies   ROS  As noted in HPI.   Physical Exam  BP 126/80   Pulse 96   Temp 98.4 F (36.9 C) (Oral)   Resp 16   Ht 5\' 9"  (1.753 m)   Wt (!) 365 lb (165.6 kg)   SpO2 98%   BMI 53.90 kg/m   Constitutional: Well developed, well nourished, no acute distress. Morbidly obese.  Eyes:  EOMI, conjunctiva normal bilaterally HENT: Normocephalic, atraumatic,mucus membranes moist. No nasal congestion, normal turbinates. No sinus tenderness. TMs normal bilaterally. Positive cobblestoning. No appreciable postnasal drip. Respiratory: Normal inspiratory effort, wheezing bilateral bases. Fair air movement. No chest wall tenderness. Cardiovascular: Normal rate, regular rhythm no murmurs rubs or gallops GI: nondistended skin: No rash, skin intact Musculoskeletal: no deformities 1+ lower extremity edema bilaterally, calves symmetric, nontender. Neurologic: Alert & oriented x 3, no focal neuro deficits Psychiatric: Speech and behavior appropriate   ED Course   Medications - No data to display  Orders Placed This Encounter  Procedures  . DG Chest 2 View    Standing Status:   Standing    Number of Occurrences:   1    Order Specific Question:   Reason for Exam (SYMPTOM  OR DIAGNOSIS REQUIRED)    Answer:   cough x 1 month    No results found for this or any previous visit (from the past 24 hour(s)). Dg Chest 2 View  Result Date: 08/05/2017 CLINICAL DATA:  Cough and  shortness of breath . EXAM: CHEST  2 VIEW COMPARISON:  10/06/2010. FINDINGS: Mediastinum hilar structures are normal. Low lung volumes with mild basilar atelectasis. Mild right base pneumonia cannot be excluded. Heart size normal. No pleural effusion or pneumothorax. IMPRESSION: Low lung volumes with mild basilar atelectasis. Mild right base pneumonia cannot be excluded . Electronically Signed   By: Maisie Fushomas  Register   On: 08/05/2017 13:51    ED Clinical Impression  Mild asthma with exacerbation, unspecified whether persistent  Community acquired pneumonia of right lower lobe of lung [facility](HCC)  ED Assessment/Plan  Obtained x-ray Due to duration of symptoms are all pneumonia, mass, pulmonary edema.  Imaging independently reviewed. Low lung volumes with mild basilar atelectasis. Mild right base pneumonia cannot be excluded per radiology. See report for details.  Her symptoms could be from a low level asthma exacerbation complicated with  pneumonia, so we'll send home with azithromycin, albuterol inhaler with a spacer, and a prednisone 6 day taper. Also home with Tessalon, no cough syrups because she states they do not work for her. We'll also try some ipratropium nasal spray in addition to the Flonase to help with her nasal congestion and postnasal drip which may be contributing to her cough. Also the differential is GERD and congestive heart failure particularly with a decreased exercise tolerance and duration of symptoms, but she is currently on GERD medicines and does not appear fluid overloaded today. rx'ing her a Diflucan as well because she states that she always gets a yeast infection every time she takes antibiotics. She will follow-up with her doctor in 3 days, to the ER if she gets worse.    Meds ordered this encounter  Medications  . albuterol (PROVENTIL HFA;VENTOLIN HFA) 108 (90 Base) MCG/ACT inhaler    Sig: Inhale 1-2 puffs into the lungs every 6 (six) hours as needed for wheezing or  shortness of breath.    Dispense:  1 Inhaler    Refill:  0  . Spacer/Aero-Holding Chambers (AEROCHAMBER PLUS) inhaler    Sig: Use as instructed    Dispense:  1 each    Refill:  2  . azithromycin (ZITHROMAX) 250 MG tablet    Sig: Take 1 tablet (250 mg total) by mouth daily. 2 tabs po on day 1, 1 tab po on days 2-5    Dispense:  6 tablet    Refill:  0  . predniSONE (STERAPRED UNI-PAK 21 TAB) 10 MG (21) TBPK tablet    Sig: Dispense one 6 day pack. Take as directed with food.    Dispense:  21 tablet    Refill:  0  . ipratropium (ATROVENT) 0.06 % nasal spray    Sig: Place 2 sprays into both nostrils 4 (four) times daily. 3-4 times/ day    Dispense:  15 mL    Refill:  0  . fluconazole (DIFLUCAN) 150 MG tablet    Sig: Take 1 tablet (150 mg total) by mouth once. 1 tab po x 1. May repeat in 72 hours if no improvement    Dispense:  2 tablet    Refill:  1  . benzonatate (TESSALON) 200 MG capsule    Sig: Take 1 capsule (200 mg total) by mouth 3 (three) times daily as needed for cough.    Dispense:  30 capsule    Refill:  0    *This clinic note was created using Scientist, clinical (histocompatibility and immunogenetics). Therefore, there may be occasional mistakes despite careful proofreading.  ?   Domenick Gong, MD 08/05/17 1446

## 2017-08-19 ENCOUNTER — Ambulatory Visit (INDEPENDENT_AMBULATORY_CARE_PROVIDER_SITE_OTHER): Payer: Medicare Other | Admitting: Allergy and Immunology

## 2017-08-19 ENCOUNTER — Encounter: Payer: Self-pay | Admitting: Allergy and Immunology

## 2017-08-19 VITALS — BP 134/78 | HR 92 | Temp 98.3°F | Resp 20 | Ht 67.5 in | Wt 369.6 lb

## 2017-08-19 DIAGNOSIS — J4540 Moderate persistent asthma, uncomplicated: Secondary | ICD-10-CM | POA: Diagnosis not present

## 2017-08-19 DIAGNOSIS — T7800XA Anaphylactic reaction due to unspecified food, initial encounter: Secondary | ICD-10-CM | POA: Insufficient documentation

## 2017-08-19 DIAGNOSIS — T7800XD Anaphylactic reaction due to unspecified food, subsequent encounter: Secondary | ICD-10-CM | POA: Diagnosis not present

## 2017-08-19 DIAGNOSIS — J3089 Other allergic rhinitis: Secondary | ICD-10-CM | POA: Diagnosis not present

## 2017-08-19 MED ORDER — EPINEPHRINE 0.3 MG/0.3ML IJ SOAJ: 0.3000 mg | Freq: Once | INTRAMUSCULAR | 2 refills | Status: AC

## 2017-08-19 MED ORDER — LEVOCETIRIZINE DIHYDROCHLORIDE 5 MG PO TABS: 5.0000 mg | ORAL_TABLET | Freq: Every day | ORAL | 5 refills | Status: DC | PRN

## 2017-08-19 MED ORDER — AZELASTINE HCL 0.1 % NA SOLN: 2.0000 | Freq: Two times a day (BID) | NASAL | 5 refills | Status: DC

## 2017-08-19 NOTE — Patient Instructions (Addendum)
Food allergy The patient's history suggests shellfish allergy and positive skin test results today confirm this diagnosis.  Meticulous avoidance of shellfish as discussed.  A prescription has been provided for epinephrine 0.3 mg autoinjector 2 pack along with instructions for its proper administration.  A food allergy action plan has been provided and discussed.  Medic Alert identification is recommended.  Perennial and seasonal allergic rhinitis  Aeroallergen avoidance measures have been discussed and provided in written form.  A prescription has been provided for levocetirizine,  daily as needed.  A prescription has been provided for azelastine nasal spray, 1-2 sprays per nostril 2 times daily as needed. Proper nasal spray technique has been discussed and demonstrated.   I have also recommended nasal saline spray (i.e., Simply Saline) or nasal saline lavage (i.e., NeilMed) as needed and prior to medicated nasal sprays.  The risks and benefits of aeroallergen immunotherapy have been discussed. The patient is motivated to initiate immunotherapy to reduce symptoms and decrease medication requirement. Informed consent has been signed and allergen vaccine orders have been submitted. Medications will be decreased or discontinued as symptom relief from immunotherapy becomes evident.  Moderate persistent asthma  Tobacco cessation has been discussed and encouraged.  For now, continue Symbicort 80-4 0.5 g, 2 inhalations twice a day, and albuterol HFA, 1-2 inhalations every 4-6 hours if needed.  To maximize pulmonary deposition, a spacer has been provided along with instructions for its proper administration with an HFA inhaler.  Subjective and objective measures of pulmonary function will be followed and the treatment plan will be adjusted accordingly.   Return in about 4 months (around 12/19/2017), or if symptoms worsen or fail to improve.  Reducing Pollen Exposure  The American  [employer] suggests the following steps to reduce your exposure to pollen during allergy seasons.    1. Do not hang sheets or clothing out to dry; pollen may collect on these items. 2. Do not mow lawns or spend time around freshly cut grass; mowing stirs up pollen. 3. Keep windows closed at night.  Keep car windows closed while driving. 4. Minimize morning activities outdoors, a time when pollen counts are usually at their highest. 5. Stay indoors as much as possible when pollen counts or humidity is high and on windy days when pollen tends to remain in the air longer. 6. Use air conditioning when possible.  Many air conditioners have filters that trap the pollen spores. 7. Use a HEPA room air filter to remove pollen form the indoor air you breathe.   Control of House Dust Mite Allergen  House dust mites play a major role in allergic asthma and rhinitis.  They occur in environments with high humidity wherever human skin, the food for dust mites is found. High levels have been detected in dust obtained from mattresses, pillows, carpets, upholstered furniture, bed covers, clothes and soft toys.  The principal allergen of the house dust mite is found in its feces.  A gram of dust may contain 1,000 mites and 250,000 fecal particles.  Mite antigen is easily measured in the air during house cleaning activities.    1. Encase mattresses, including the box spring, and pillow, in an air tight cover.  Seal the zipper end of the encased mattresses with wide adhesive tape. 2. Wash the bedding in water of 130 degrees Farenheit weekly.  Avoid cotton comforters/quilts and flannel bedding: the most ideal bed covering is the dacron comforter. 3. Remove all upholstered furniture from the  bedroom. 4. Remove carpets, carpet padding, rugs, and non-washable window drapes from the bedroom.  Wash drapes weekly or use plastic window coverings. 5. Remove all non-washable stuffed toys from the  bedroom.  Wash stuffed toys weekly. 6. Have the room cleaned frequently with a vacuum cleaner and a damp dust-mop.  The patient should not be in a room which is being cleaned and should wait 1 hour after cleaning before going into the room. 7. Close and seal all heating outlets in the bedroom.  Otherwise, the room will become filled with dust-laden air.  An electric heater can be used to heat the room. Reduce indoor humidity to less than 50%.  Do not use a humidifier.  Control of Dog or Cat Allergen  Avoidance is the best way to manage a dog or cat allergy. If you have a dog or cat and are allergic to dog or cats, consider removing the dog or cat from the home. If you have a dog or cat but don't want to find it a new home, or if your family wants a pet even though someone in the household is allergic, here are some strategies that may help keep symptoms at bay:  1. Keep the pet out of your bedroom and restrict it to only a few rooms. Be advised that keeping the dog or cat in only one room will not limit the allergens to that room. 2. Don't pet, hug or kiss the dog or cat; if you do, wash your hands with soap and water. 3. High-efficiency particulate air (HEPA) cleaners run continuously in a bedroom or living room can reduce allergen levels over time. 4. Place electrostatic material sheet in the air inlet vent in the bedroom. 5. Regular use of a high-efficiency vacuum cleaner or a central vacuum can reduce allergen levels. 6. Giving your dog or cat a bath at least once a week can reduce airborne allergen.  Control of Mold Allergen  Mold and fungi can grow on a variety of surfaces provided certain temperature and moisture conditions exist.  Outdoor molds grow on plants, decaying vegetation and soil.  The major outdoor mold, Alternaria and Cladosporium, are found in very high numbers during hot and dry conditions.  Generally, a late Summer - Fall peak is seen for common outdoor fungal spores.  Rain  will temporarily lower outdoor mold spore count, but counts rise rapidly when the rainy period ends.  The most important indoor molds are Aspergillus and Penicillium.  Dark, humid and poorly ventilated basements are ideal sites for mold growth.  The next most common sites of mold growth are the bathroom and the kitchen.  Outdoor [employer] 2. Use air conditioning and keep windows closed 3. Avoid exposure to decaying vegetation. 4. Avoid leaf raking. 5. Avoid grain handling. 6. Consider wearing a face mask if working in moldy areas.  Indoor Mold Control 1. Maintain humidity below 50%. 2. Clean washable surfaces with 5% bleach solution. 3. Remove sources e.g. Contaminated carpets.  Control of Cockroach Allergen  Cockroach allergen has been identified as an important cause of acute attacks of asthma, especially in urban settings.  There are fifty-five species of cockroach that exist in the [place], however only three, the [place], [place] species produce allergen that can affect patients with Asthma.  Allergens can be obtained from fecal particles, egg casings and secretions from cockroaches.    1. Remove food sources. 2. Reduce access to water. 3. Seal access and entry points. 4.  Spray runways with 0.5-1% Diazinon or Chlorpyrifos 5. Blow boric acid power under stoves and refrigerator. 6. Place bait stations (hydramethylnon) at feeding sites.

## 2017-08-19 NOTE — Assessment & Plan Note (Signed)
   Tobacco cessation has been discussed and encouraged.  For now, continue Symbicort 80-4 0.5 g, 2 inhalations twice a day, and albuterol HFA, 1-2 inhalations every 4-6 hours if needed.  To maximize pulmonary deposition, a spacer has been provided along with instructions for its proper administration with an HFA inhaler.  Subjective and objective measures of pulmonary function will be followed and the treatment plan will be adjusted accordingly.

## 2017-08-19 NOTE — Assessment & Plan Note (Addendum)
The patient's history suggests shellfish allergy and positive skin test results today confirm this diagnosis.  Meticulous avoidance of shellfish as discussed.  A prescription has been provided for epinephrine 0.3 mg autoinjector 2 pack along with instructions for its proper administration.  A food allergy action plan has been provided and discussed.  Medic Alert identification is recommended.

## 2017-08-19 NOTE — Progress Notes (Signed)
New Patient Note  RE: [name] MRN: [id number] DOB: February 01, 1976 Date of Office Visit: 08/19/2017  Referring provider: Fleet Contras, MD Primary care provider: Fleet Contras, MD  Chief Complaint: Allergic Reaction; Allergic Rhinitis ; and Asthma   History of present illness: [name] is a 41 y.o. female seen today in consultation requested by Fleet Contras, MD.  She reports that approximately 3 months ago she consumed shrimp tacos consisting of shrimp, taco shell, lettuce, cheese, and sour cream. While eating this meal, she developed generalized pruritus, urticaria on the face, neck, and back, angioedema of her upper lip, dyspnea, coughing, and oral pruritus.  She was taken to the emergency department and was treated with IM epinephrine as well as diphenhydramine.  She was observed for approximately 4 hours and released in stable condition with a prescription for epinephrine autoinjectors.  She has avoided shellfish since that time. She has had asthma since early childhood.  Her asthma triggers include upper respiratory tract infections and heat/humidity.  She currently takes Symbicort 80-4.5 g, 2 inhalations twice a day.  She does not use a spacer device with HFA inhalers.  She currently smokes half pack of cigarettes per day, down from 2 packs per day.  She states that she is not ready to consider quitting at this time given her current life stressors. She experiences nasal congestion, rhinorrhea, sneezing, postnasal drainage, nasal pruritus, ocular pruritus, and occasional sinus pressure.  These symptoms are most severe during the summertime.  She attempts to control these symptoms with ipratropium nasal spray and/or oxymetazoline nasal spray.   Assessment and plan: Food allergy The patient's history suggests shellfish allergy and positive skin test results today confirm this diagnosis.  Meticulous avoidance of shellfish as discussed.  A prescription has been provided  for epinephrine 0.3 mg autoinjector 2 pack along with instructions for its proper administration.  A food allergy action plan has been provided and discussed.  Medic Alert identification is recommended.  Perennial and seasonal allergic rhinitis  Aeroallergen avoidance measures have been discussed and provided in written form.  A prescription has been provided for levocetirizine,  daily as needed.  A prescription has been provided for azelastine nasal spray, 1-2 sprays per nostril 2 times daily as needed. Proper nasal spray technique has been discussed and demonstrated.   I have also recommended nasal saline spray (i.e., Simply Saline) or nasal saline lavage (i.e., NeilMed) as needed and prior to medicated nasal sprays.  The risks and benefits of aeroallergen immunotherapy have been discussed. The patient is motivated to initiate immunotherapy to reduce symptoms and decrease medication requirement. Informed consent has been signed and allergen vaccine orders have been submitted. Medications will be decreased or discontinued as symptom relief from immunotherapy becomes evident.  Moderate persistent asthma  Tobacco cessation has been discussed and encouraged.  For now, continue Symbicort 80-4 0.5 g, 2 inhalations twice a day, and albuterol HFA, 1-2 inhalations every 4-6 hours if needed.  To maximize pulmonary deposition, a spacer has been provided along with instructions for its proper administration with an HFA inhaler.  Subjective and objective measures of pulmonary function will be followed and the treatment plan will be adjusted accordingly.   Meds ordered this encounter  Medications  . EPINEPHrine (EPIPEN 2-PAK) 0.3 mg/0.3 mL IJ SOAJ injection    Sig: Inject 0.3 mLs (0.3 mg total) into the muscle once.    Dispense:  2 Device    Refill:  2  . levocetirizine (XYZAL) 5 MG tablet  Sig: Take 1 tablet (5 mg total) by mouth daily as needed for allergies.    Dispense:  30 tablet      Refill:  5  . azelastine (ASTELIN) 0.1 % nasal spray    Sig: Place 2 sprays into both nostrils 2 (two) times daily.    Dispense:  30 mL    Refill:  5    Diagnostics: Spirometry: Spirometry reveals an FVC of 2.22 L (69% predicted) and an FEV1 of 1.69 L (63% predicted) without 50 mL (9%) post bronchodilator improvement. This study was performed while the patient was asymptomatic.  Please see scanned spirometry results for details. Environmental skin testing: Positive to grass pollen, weed pollen, ragweed pollen, tree pollen, molds, cat hair, dog epithelia, cockroach antigen, and dust mite antigen. Food allergen skin testing: Positive to shellfish mix, shrimp, crab, and scallops.    Physical examination: Blood pressure 134/78, pulse 92, temperature 98.3 F (36.8 C), resp. rate 20, height 5' 7.5" (1.715 m), weight (!) 369 lb 9.6 oz (167.6 kg), SpO2 97 %.  General: Alert, interactive, obese, in no acute distress. HEENT: TMs pearly gray, turbinates edematous with thick discharge, post-pharynx moderately erythematous. Neck: Supple without lymphadenopathy. Lungs: Mildly decreased breath sounds bilaterally without wheezing, rhonchi or rales. CV: Normal S1, S2 without murmurs. Abdomen: Nondistended, nontender. Skin: Warm and dry, without lesions or rashes. Extremities:  No clubbing, cyanosis or edema. Neuro:   Grossly intact.  Review of systems:  Review of systems negative except as noted in HPI / PMHx or noted below: Review of Systems  Constitutional: Negative.   HENT: Negative.   Eyes: Negative.   Respiratory: Negative.   Cardiovascular: Negative.   Gastrointestinal: Negative.   Genitourinary: Negative.   Musculoskeletal: Negative.   Skin: Negative.   Neurological: Negative.   Endo/Heme/Allergies: Negative.   Psychiatric/Behavioral: Negative.     Past medical history:  Past Medical History:  Diagnosis Date  . Asthma   . Hydradenitis   . Obesity     Past surgical  history:  Past Surgical History:  Procedure Laterality Date  . CESAREAN SECTION     x2  . WISDOM TOOTH EXTRACTION  2011    Family history: Family History  Problem Relation Age of Onset  . Diabetes Mother   . Hypertension Mother   . Diabetes Father   . Asthma Other   . Hyperlipidemia Other   . Hypertension Other     Social history: Social History   Social History  . Marital status: Single    Spouse name: N/A  . Number of children: N/A  . Years of education: N/A   Occupational History  . Not on file.   Social History Main Topics  . Smoking status: Current Some Day Smoker    Packs/day: 0.33    Types: Cigarettes  . Smokeless tobacco: Never Used  . Alcohol use Yes     Comment: once a month at a party beer wine or mixed drinks  . Drug use: No  . Sexual activity: Yes    Birth control/ protection: None   Other Topics Concern  . Not on file   Social History Narrative  . No narrative on file   Environmental History: The patient lives in an apartment with carpeting throughout, gas heat, and window air conditioning units.  There are cats and dogs in the apartment which have access to her bedroom.  She currently smokes half pack per day and has done so past 30 years.  Allergies as of  08/19/2017   No Known Allergies     Medication List       Accurate as of 08/19/17  5:38 PM. Always use your most recent med list.          AEROCHAMBER PLUS inhaler Use as instructed   albuterol 108 (90 Base) MCG/ACT inhaler Commonly known as:  PROVENTIL HFA;VENTOLIN HFA Inhale 1-2 puffs into the lungs every 6 (six) hours as needed for wheezing or shortness of breath.   azelastine 0.1 % nasal spray Commonly known as:  ASTELIN Place 2 sprays into both nostrils 2 (two) times daily.   azithromycin 250 MG tablet Commonly known as:  ZITHROMAX Take 1 tablet (250 mg total) by mouth daily. 2 tabs po on day 1, 1 tab po on days 2-5   benzonatate 200 MG capsule Commonly known as:   TESSALON Take 1 capsule (200 mg total) by mouth 3 (three) times daily as needed for cough.   doxycycline 100 MG capsule Commonly known as:  VIBRAMYCIN Take 1 capsule (100 mg total) by mouth 2 (two) times daily.   EPINEPHrine 0.3 mg/0.3 mL Soaj injection Commonly known as:  EPIPEN 2-PAK Inject 0.3 mLs (0.3 mg total) into the muscle once.   esomeprazole 40 MG capsule Commonly known as:  NEXIUM Take 40 mg by mouth daily.   fluticasone 50 MCG/ACT nasal spray Commonly known as:  FLONASE Place 2 sprays into both nostrils daily.   ibuprofen 800 MG tablet Commonly known as:  ADVIL,MOTRIN Take 1 tablet (800 mg total) by mouth 3 (three) times daily.   ipratropium 0.06 % nasal spray Commonly known as:  ATROVENT Place 2 sprays into both nostrils 4 (four) times daily. 3-4 times/ day   levocetirizine 5 MG tablet Commonly known as:  XYZAL Take 1 tablet (5 mg total) by mouth daily as needed for allergies.   olopatadine 0.1 % ophthalmic solution Commonly known as:  PATANOL Place 1 drop into both eyes 2 (two) times daily.   oxymetazoline 0.05 % nasal spray Commonly known as:  AFRIN Place 1 spray into both nostrils 2 (two) times daily. for no more than 5 days            Discharge Care Instructions        Start     Ordered   08/19/17 0000  Spirometry with Graph    Question Answer Comment  Where should this test be performed? Other   Spirometry pre & post bronchodilator Yes      08/19/17 1738   08/19/17 0000  Allergy Test    Question:  Allergy test to perform  Answer:  1-69 and shellfish   08/19/17 1738   08/19/17 0000  Interdermal Allergy Test    Question Answer Comment  Allergens Control   Allergens Ragweed Mix   Allergens Tree Mix   Allergens Mold 2   Allergens Mold 3   Allergens Mold 4   Allergens Dog      08/19/17 1738   08/19/17 0000  EPINEPHrine (EPIPEN 2-PAK) 0.3 mg/0.3 mL IJ SOAJ injection   Once     08/19/17 1738   08/19/17 0000  levocetirizine (XYZAL) 5 MG  tablet  Daily PRN     08/19/17 1738   08/19/17 0000  azelastine (ASTELIN) 0.1 % nasal spray  2 times daily     08/19/17 1738   08/19/17 0000  Allergen Immunotherapy    Comments:  Aeroallergen Immunotherapy    Patient Details  Name: [name] MRN: [id number]  Date of Birth: November 03, 1976  Order 1 of 2  Vial Label: G/Weeds/T/DM  0.3 ml (Volume)  BAU Concentration -- 7 Grass Mix* 100,000 ([address] [place], [place], [place], Perennial [place], [place], Sweet [place], [place]) 0.2 ml (Volume)  1:20 Concentration -- [place] 0.2 ml (Volume)  BAU Concentration -- [place] 10,000 0.2 ml (Volume)  1:20 Concentration -- [place]  0.3 ml (Volume)  1:20 Concentration -- Ragweed Mix 0.5 ml (Volume)  1:20 Concentration -- Weed Mix* 0.5 ml (Volume)  1:20 Concentration -- Eastern 10 Tree Mix (also Sweet Gum) 0.4 ml (Volume)    AU Concentration -- Mite Mix (DF 5,000 & DP 5,000)  2.6  ml Extract Subtotal 2.4  ml Diluent 5.0  ml Maintenance Total   Final Concentration above is stated in weight/volume (wt/vol).  Allergen units (AU/ml) biological units (BAU/ml).  The total volume is 5 ml.    Schedule:  A  Special Instructions: 1 or 2  times per week with at least 72 hours in between.  PLEASE CONFIRM FIRST INJECTION APPOINTMENT PRIOR TO MIXING.  Question Answer Comment  Has the patient signed consent? Yes   Location of Immunotherapy [facility]      08/19/17 1738   08/19/17 0000  Allergen Immunotherapy    Comments:  Aeroallergen Immunotherapy    Patient Details  Name: [name] MRN: [id number] Date of Birth: 23-Mar-1976  Order 2 of 2  Vial Label: M/C/D/CR  0.2 ml (Volume)  1:20 Concentration -- Alternaria alternata 0.2 ml (Volume)  1:20 Concentration -- Cladosporium herbarum 0.2 ml (Volume)  1:10 Concentration -- Penicillium mix 0.2 ml (Volume)  1:20 Concentration -- Bipolaris sorokiniana 0.2 ml (Volume)  1:20 Concentration -- Drechslera spicifera 0.2 ml (Volume)  1:10  Concentration -- Mucor plumbeus 0.2 ml (Volume)  1:10 Concentration -- Fusarium moniliforme 0.2 ml (Volume)  1:40 Concentration -- Aureobasidium pullulans 0.2 ml (Volume)  1:10 Concentration -- Rhizopus oryzae 0.2 ml (Volume)  1:40 Concentration -- Phoma betae 0.5 ml (Volume)  1:10 Concentration -- Cat Hair 0.5 ml (Volume)  1:10 Concentration -- Dog Epithelia 0.3 ml (Volume)  1:20 Concentration -- Cockroach, [place]   3.3  ml Extract Subtotal 1.7  ml Diluent 5.0  ml Maintenance Total   Final Concentration above is stated in weight/volume (wt/vol).  Allergen units (AU/ml) biological units (BAU/ml).  The total volume is 5 ml.    Schedule:  A  Special Instructions: 1 or 2  times per week with at least 72 hours in between. PLEASE CONFIRM FIRST INJECTION APPOINTMENT PRIOR TO MIXING.  Question Answer Comment  Has the patient signed consent? Yes   Location of Immunotherapy [facility]      08/19/17 1738      Known medication allergies: No Known Allergies  I appreciate the opportunity to take part in Lubertha's care. Please do not hesitate to contact me with questions.  Sincerely,   R. Jorene Guest, MD

## 2017-08-19 NOTE — Assessment & Plan Note (Addendum)
   Aeroallergen avoidance measures have been discussed and provided in written form.  A prescription has been provided for levocetirizine,  daily as needed.  A prescription has been provided for azelastine nasal spray, 1-2 sprays per nostril 2 times daily as needed. Proper nasal spray technique has been discussed and demonstrated.   I have also recommended nasal saline spray (i.e., Simply Saline) or nasal saline lavage (i.e., NeilMed) as needed and prior to medicated nasal sprays.  The risks and benefits of aeroallergen immunotherapy have been discussed. The patient is motivated to initiate immunotherapy to reduce symptoms and decrease medication requirement. Informed consent has been signed and allergen vaccine orders have been submitted. Medications will be decreased or discontinued as symptom relief from immunotherapy becomes evident.

## 2017-08-21 DIAGNOSIS — J301 Allergic rhinitis due to pollen: Secondary | ICD-10-CM | POA: Diagnosis not present

## 2017-08-21 NOTE — Progress Notes (Signed)
VIALS EXP 08-21-18 

## 2017-08-22 DIAGNOSIS — J3089 Other allergic rhinitis: Secondary | ICD-10-CM | POA: Diagnosis not present

## 2017-09-04 ENCOUNTER — Ambulatory Visit: Payer: Medicaid Other

## 2017-09-11 ENCOUNTER — Ambulatory Visit (INDEPENDENT_AMBULATORY_CARE_PROVIDER_SITE_OTHER): Payer: Medicare Other | Admitting: *Deleted

## 2017-09-11 DIAGNOSIS — J309 Allergic rhinitis, unspecified: Secondary | ICD-10-CM | POA: Diagnosis not present

## 2017-09-11 NOTE — Progress Notes (Signed)
Immunotherapy   Patient Details  Name: [name] MRN: [id number] Date of Birth: 1976/07/22  09/11/2017  [name] started injections for  Grass-Weeds-Tree-Dmite & Mold-Cat-Dog-CR Following schedule: A  Frequency:2 times per week Epi-Pen:Epi-Pen Available  Consent signed and patient instructions given. No problems after 30 minutes in the office.   Vella RedheadHeather Clark 09/11/2017, 2:45 PM

## 2017-09-14 ENCOUNTER — Encounter (HOSPITAL_COMMUNITY): Payer: Self-pay | Admitting: Emergency Medicine

## 2017-09-14 ENCOUNTER — Ambulatory Visit (HOSPITAL_COMMUNITY)
Admission: EM | Admit: 2017-09-14 | Discharge: 2017-09-14 | Disposition: A | Discharge status: Home / Self Care | Payer: Medicare Other

## 2017-09-14 DIAGNOSIS — J4541 Moderate persistent asthma with (acute) exacerbation: Secondary | ICD-10-CM

## 2017-09-14 DIAGNOSIS — R0602 Shortness of breath: Secondary | ICD-10-CM

## 2017-09-14 DIAGNOSIS — R05 Cough: Secondary | ICD-10-CM

## 2017-09-14 DIAGNOSIS — M609 Myositis, unspecified: Secondary | ICD-10-CM | POA: Diagnosis not present

## 2017-09-14 MED ORDER — DEXAMETHASONE SODIUM PHOSPHATE 10 MG/ML IJ SOLN
INTRAMUSCULAR | Status: AC
  Filled 2017-09-14: qty 1

## 2017-09-14 MED ORDER — PREDNISONE 10 MG (21) PO TBPK: ORAL_TABLET | ORAL | 0 refills | Status: DC

## 2017-09-14 MED ORDER — SODIUM CHLORIDE 0.9 % IN NEBU
INHALATION_SOLUTION | RESPIRATORY_TRACT | Status: AC
  Filled 2017-09-14: qty 3

## 2017-09-14 MED ORDER — DEXAMETHASONE SODIUM PHOSPHATE 10 MG/ML IJ SOLN
10.0000 mg | Freq: Once | INTRAMUSCULAR | Status: AC
  Administered 2017-09-14: 10 mg via INTRAMUSCULAR

## 2017-09-14 MED ORDER — ALBUTEROL SULFATE HFA 108 (90 BASE) MCG/ACT IN AERS: 2.0000 | INHALATION_SPRAY | RESPIRATORY_TRACT | 0 refills | Status: DC | PRN

## 2017-09-14 MED ORDER — IPRATROPIUM-ALBUTEROL 0.5-2.5 (3) MG/3ML IN SOLN
3.0000 mL | Freq: Once | RESPIRATORY_TRACT | Status: AC
  Administered 2017-09-14: 3 mL via RESPIRATORY_TRACT

## 2017-09-14 MED ORDER — IPRATROPIUM-ALBUTEROL 0.5-2.5 (3) MG/3ML IN SOLN
RESPIRATORY_TRACT | Status: AC
  Filled 2017-09-14: qty 3

## 2017-09-14 NOTE — Discharge Instructions (Signed)
Use the albuterol inhaler 2 puffs every 4 hours as needed for cough and wheeze. Continue your other inhalers. 4 year muscle pain apply heat. The steroid pack should help with pain because it is too primarily to inflammation. Follow-up with your primary care doctor as needed.

## 2017-09-14 NOTE — ED Provider Notes (Signed)
[facility]    CSN: [id number] Arrival date & time: 09/14/17  1905     History   Chief Complaint Chief Complaint  Patient presents with  . Asthma  . Arm Pain    HPI [name] is a 41 y.o. female.   Morbidly and severely obese 41 year old female complaining of asthma with a flareup for at least 3 weeks. She states she was in the ED for 5 weeks with same thing and had S several treatments and medications. She was scheduled an appointment with an allergist recently and was told not to take any of her medications prior to that visit. Since being off her medications she has developed another asthma exacerbation. He only inhaler she has at home is Symbicort. She is currently complaining of wheezing and shortness of breath and DOE.  Second complaint is that of left shoulder pain from the left side of the neck over the trapezius to the deltoid muscle. She states she got angry at her daughter and the next morning her arm began to hurt. Unable to make a better connection. Pain is worse with movement and had abduction of the arm.      Past Medical History:  Diagnosis Date  . Asthma   . Hydradenitis   . Obesity     Patient Active Problem List   Diagnosis Date Noted  . Food allergy 08/19/2017  . Perennial and seasonal allergic rhinitis 08/19/2017  . Moderate persistent asthma 08/19/2017    Past Surgical History:  Procedure Laterality Date  . CESAREAN SECTION     x2  . WISDOM TOOTH EXTRACTION  2011    OB History    No data available       Home Medications    Prior to Admission medications   Medication Sig Start Date End Date Taking? Authorizing Provider  azelastine (ASTELIN) 0.1 % nasal spray Place 2 sprays into both nostrils 2 (two) times daily. 08/19/17  Yes Bobbitt, Heywood Ilesalph Carter, MD  EPINEPHrine 0.3 mg/0.3 mL IJ SOAJ injection Inject into the muscle once.   Yes [provider]  esomeprazole (NEXIUM) 40 MG capsule Take 40 mg by mouth daily.  07/26/17  Yes [provider]  fluticasone (FLONASE) 50 MCG/ACT nasal spray Place 2 sprays into both nostrils daily. 07/10/16  Yes Tyrone NineGrunz, Ryan B, MD  ipratropium (ATROVENT) 0.06 % nasal spray Place 2 sprays into both nostrils 4 (four) times daily. 3-4 times/ day 08/05/17  Yes Domenick GongMortenson, Ashley, MD  levocetirizine (XYZAL) 5 MG tablet Take 1 tablet (5 mg total) by mouth daily as needed for allergies. 08/19/17  Yes Bobbitt, Heywood Ilesalph Carter, MD  olopatadine (PATANOL) 0.1 % ophthalmic solution Place 1 drop into both eyes 2 (two) times daily.   Yes [provider]  oxymetazoline (AFRIN) 0.05 % nasal spray Place 1 spray into both nostrils 2 (two) times daily. for no more than 5 days 07/10/16  Yes Tyrone NineGrunz, Ryan B, MD  Spacer/Aero-Holding Chambers (AEROCHAMBER PLUS) inhaler Use as instructed 08/05/17  Yes Domenick GongMortenson, Ashley, MD  albuterol (PROVENTIL HFA;VENTOLIN HFA) 108 (90 Base) MCG/ACT inhaler Inhale 2 puffs into the lungs every 4 (four) hours as needed for wheezing or shortness of breath. 09/14/17   Hayden RasmussenMabe, Demeka Sutter, NP  benzonatate (TESSALON) 200 MG capsule Take 1 capsule (200 mg total) by mouth 3 (three) times daily as needed for cough. 08/05/17   Domenick GongMortenson, Ashley, MD  predniSONE (STERAPRED UNI-PAK 21 TAB) 10 MG (21) TBPK tablet Dispense one 6 day pack. Take as  directed with food. 09/14/17   Hayden Rasmussen, NP    Family History Family History  Problem Relation Age of Onset  . Diabetes Mother   . Hypertension Mother   . Diabetes Father   . Asthma Other   . Hyperlipidemia Other   . Hypertension Other     Social History Social History  Substance Use Topics  . Smoking status: Current Some Day Smoker    Packs/day: 0.33    Types: Cigarettes  . Smokeless tobacco: Never Used  . Alcohol use Yes     Comment: once a month at a party beer wine or mixed drinks     Allergies   Patient has no known allergies.   Review of Systems Review of Systems  Constitutional: Positive for activity change.  Negative for fever.  HENT: Negative.   Respiratory: Positive for cough, shortness of breath and wheezing.   Cardiovascular: Negative.   Gastrointestinal: Negative.   Musculoskeletal: Positive for myalgias.       As per history of present illness  Skin: Negative.   Neurological: Negative.   All other systems reviewed and are negative.    Physical Exam Triage Vital Signs ED Triage Vitals  Enc Vitals Group     BP 09/14/17 1909 127/77     Pulse Rate 09/14/17 1909 (!) 104     Resp 09/14/17 1909 (!) 22     Temp 09/14/17 1909 98.1 F (36.7 C)     Temp Source 09/14/17 1909 Oral     SpO2 09/14/17 1909 100 %     Weight --      Height --      Head Circumference --      Peak Flow --      Pain Score 09/14/17 1910 5     Pain Loc --      Pain Edu? --      Excl. in GC? --    No data found.   Updated Vital Signs BP 127/77   Pulse (!) 104   Temp 98.1 F (36.7 C) (Oral)   Resp (!) 22   LMP 09/14/2017   SpO2 100%   Visual Acuity Right Eye Distance:   Left Eye Distance:   Bilateral Distance:    Right Eye Near:   Left Eye Near:    Bilateral Near:     Physical Exam  Constitutional: She appears well-developed and well-nourished. No distress.  Eyes: EOM are normal.  Neck: Neck supple.  Cardiovascular: Normal rate and regular rhythm.   Pulmonary/Chest: Effort normal. She has wheezes.  Increased respiratory effort. Expiratory wheezing. Prolonged expiratory phase. Decreased air movement.  Musculoskeletal:  Tenderness to the left para cervical and left trapezius muscle. After palpating these areas the patient told me to stop because it hurt too much to touch even lightly. She does however use left arm she doubled her reached on her back and remove her coat and assist herself getting on the bed with  use of the left arm.  Neurological: She is alert.  Skin: Skin is warm and dry.  Psychiatric: She has a normal mood and affect.  Nursing note and vitals reviewed.    UC Treatments  / Results  Labs (all labs ordered are listed, but only abnormal results are displayed) Labs Reviewed - No data to display  EKG  EKG Interpretation None       Radiology No results found.  Procedures Procedures (including critical care time)  Medications Ordered in UC Medications  ipratropium-albuterol (DUONEB)  0.5-2.5 (3) MG/3ML nebulizer solution 3 mL (3 mLs Nebulization Given 09/14/17 2101)  dexamethasone (DECADRON) injection 10 mg (10 mg Intramuscular Given 09/14/17 2100)     Initial Impression / Assessment and Plan / UC Course  I have reviewed the triage vital signs and the nursing notes.  Pertinent labs & imaging results that were available during my care of the patient were reviewed by me and considered in my medical decision making (see chart for details).    After the DuoNeb the patient states she is breathing better. Auscultation reveals improved air movement and decreased wheeze.   Final Clinical Impressions(s) / UC Diagnoses   Final diagnoses:  Moderate persistent asthma with exacerbation  Myofasciitis    New Prescriptions New Prescriptions   ALBUTEROL (PROVENTIL HFA;VENTOLIN HFA) 108 (90 BASE) MCG/ACT INHALER    Inhale 2 puffs into the lungs every 4 (four) hours as needed for wheezing or shortness of breath.   PREDNISONE (STERAPRED UNI-PAK 21 TAB) 10 MG (21) TBPK TABLET    Dispense one 6 day pack. Take as directed with food.     Controlled Substance Prescriptions [place] Controlled Substance Registry consulted? Not Applicable   Hayden Rasmussen, NP 09/14/17 2129

## 2017-09-14 NOTE — ED Triage Notes (Signed)
Pt c/o asthma flair up x2 weeks. Pt also states she woke up this morning with L shoulder pain, worse with movement.

## 2017-10-25 ENCOUNTER — Other Ambulatory Visit: Payer: Self-pay

## 2017-10-25 ENCOUNTER — Emergency Department (HOSPITAL_COMMUNITY)
Admission: EM | Admit: 2017-10-25 | Discharge: 2017-10-25 | Disposition: A | Discharge status: Home / Self Care | Payer: Medicare Other | Attending: Emergency Medicine | Admitting: Emergency Medicine

## 2017-10-25 ENCOUNTER — Encounter (HOSPITAL_COMMUNITY): Payer: Self-pay | Admitting: Nurse Practitioner

## 2017-10-25 DIAGNOSIS — J45909 Unspecified asthma, uncomplicated: Secondary | ICD-10-CM | POA: Diagnosis not present

## 2017-10-25 DIAGNOSIS — H5789 Other specified disorders of eye and adnexa: Secondary | ICD-10-CM | POA: Diagnosis present

## 2017-10-25 DIAGNOSIS — F17210 Nicotine dependence, cigarettes, uncomplicated: Secondary | ICD-10-CM | POA: Insufficient documentation

## 2017-10-25 DIAGNOSIS — H109 Unspecified conjunctivitis: Secondary | ICD-10-CM

## 2017-10-25 DIAGNOSIS — Z79899 Other long term (current) drug therapy: Secondary | ICD-10-CM | POA: Diagnosis not present

## 2017-10-25 MED ORDER — ERYTHROMYCIN 5 MG/GM OP OINT: TOPICAL_OINTMENT | Freq: Once | OPHTHALMIC | Status: DC

## 2017-10-25 MED ORDER — TETRACAINE HCL 0.5 % OP SOLN: 1.0000 [drp] | Freq: Once | OPHTHALMIC | Status: DC

## 2017-10-25 MED ORDER — ERYTHROMYCIN 5 MG/GM OP OINT
TOPICAL_OINTMENT | Freq: Once | OPHTHALMIC | Status: AC
  Administered 2017-10-25: 1 via OPHTHALMIC
  Filled 2017-10-25: qty 3.5

## 2017-10-25 MED ORDER — FLUORESCEIN SODIUM 1 MG OP STRP: 1.0000 | ORAL_STRIP | Freq: Once | OPHTHALMIC | Status: DC

## 2017-10-25 NOTE — Discharge Instructions (Signed)
Use the eye ointment 3 times a day for the next 5 to 7 days. Follow up with your doctor.

## 2017-10-25 NOTE — ED Provider Notes (Signed)
[facility]-EMERGENCY DEPT Provider Note   CSN: [id number] Arrival date & time: 10/25/17  0932     History   Chief Complaint Chief Complaint  Patient presents with  . Conjunctivitis    HPI [name] is a 41 y.o. female who presents to the ED with right eye redness, itching and irritation. Patient reports that she was exposed to her niece that had pink eye. Patient states she wants the medication for her eye, she needs to get home to her children. She states she does not want a vision test, an eye exam or any other testing that she knows what she has. She works in a day care and needs the medication. Patient upset because she has been here 2 hours before fast tract opened and she called Urgent Care first and they told her to come here because they did not open until 12.   HPI  Past Medical History:  Diagnosis Date  . Asthma   . Hydradenitis   . Obesity     Patient Active Problem List   Diagnosis Date Noted  . Food allergy 08/19/2017  . Perennial and seasonal allergic rhinitis 08/19/2017  . Moderate persistent asthma 08/19/2017    Past Surgical History:  Procedure Laterality Date  . CESAREAN SECTION     x2  . WISDOM TOOTH EXTRACTION  2011    OB History    No data available       Home Medications    Prior to Admission medications   Medication Sig Start Date End Date Taking? Authorizing Provider  albuterol (PROVENTIL HFA;VENTOLIN HFA) 108 (90 Base) MCG/ACT inhaler Inhale 2 puffs into the lungs every 4 (four) hours as needed for wheezing or shortness of breath. 09/14/17   Hayden RasmussenMabe, David, NP  azelastine (ASTELIN) 0.1 % nasal spray Place 2 sprays into both nostrils 2 (two) times daily. 08/19/17   Bobbitt, Heywood Ilesalph Carter, MD  benzonatate (TESSALON) 200 MG capsule Take 1 capsule (200 mg total) by mouth 3 (three) times daily as needed for cough. 08/05/17   Domenick GongMortenson, Ashley, MD  EPINEPHrine 0.3 mg/0.3 mL IJ SOAJ injection Inject into the muscle once.     [provider]  esomeprazole (NEXIUM) 40 MG capsule Take 40 mg by mouth daily. 07/26/17   [provider]  fluticasone (FLONASE) 50 MCG/ACT nasal spray Place 2 sprays into both nostrils daily. 07/10/16   Tyrone NineGrunz, Ryan B, MD  ipratropium (ATROVENT) 0.06 % nasal spray Place 2 sprays into both nostrils 4 (four) times daily. 3-4 times/ day 08/05/17   Domenick GongMortenson, Ashley, MD  levocetirizine (XYZAL) 5 MG tablet Take 1 tablet (5 mg total) by mouth daily as needed for allergies. 08/19/17   Bobbitt, Heywood Ilesalph Carter, MD  olopatadine (PATANOL) 0.1 % ophthalmic solution Place 1 drop into both eyes 2 (two) times daily.    [provider]  oxymetazoline (AFRIN) 0.05 % nasal spray Place 1 spray into both nostrils 2 (two) times daily. for no more than 5 days 07/10/16   Tyrone NineGrunz, Ryan B, MD  predniSONE (STERAPRED UNI-PAK 21 TAB) 10 MG (21) TBPK tablet Dispense one 6 day pack. Take as directed with food. 09/14/17   Hayden RasmussenMabe, David, NP  Spacer/Aero-Holding Chambers (AEROCHAMBER PLUS) inhaler Use as instructed 08/05/17   Domenick GongMortenson, Ashley, MD    Family History Family History  Problem Relation Age of Onset  . Diabetes Mother   . Hypertension Mother   . Diabetes Father   . Asthma Other   . Hyperlipidemia Other   .  Hypertension Other     Social History Social History   Tobacco Use  . Smoking status: Current Some Day Smoker    Packs/day: 0.33    Types: Cigarettes  . Smokeless tobacco: Never Used  Substance Use Topics  . Alcohol use: Yes    Comment: once a month at a party beer wine or mixed drinks  . Drug use: No     Allergies   Patient has no known allergies.   Review of Systems Review of Systems  Eyes: Positive for photophobia, discharge, redness and itching. Negative for visual disturbance.  All other systems reviewed and are negative.    Physical Exam Updated Vital Signs BP 136/79 (BP Location: Left Wrist)   Pulse 92   Temp 98.1 F (36.7 C) (Oral)   Resp 17   SpO2 96%    Physical Exam  Constitutional: She appears well-developed and well-nourished. No distress.  HENT:  Head: Normocephalic.  Eyes: EOM are normal. Pupils are equal, round, and reactive to light. Right eye exhibits discharge. Right conjunctiva is injected.  Neck: Neck supple.  Cardiovascular: Tachycardia present.  Pulmonary/Chest: Effort normal.  Musculoskeletal: Normal range of motion.  Neurological: She is alert.  Skin: Skin is warm and dry.  Psychiatric: She has a normal mood and affect.  Nursing note and vitals reviewed.    ED Treatments / Results  Labs (all labs ordered are listed, but only abnormal results are displayed) Labs Reviewed - No data to display   Radiology No results found.  Procedures Procedures (including critical care time)  Medications Ordered in ED Medications  erythromycin ophthalmic ointment (1 application Right Eye Given 10/25/17 1208)     Initial Impression / Assessment and Plan / ED Course  I have reviewed the triage vital signs and the nursing notes. [name] presents with symptoms consistent with bacterial conjunctivitis.  Purulent discharge exam. No evidence of preseptal or orbital cellulitis.  Patient will be given erythromycin ophthalmic.  Personal hygiene and frequent handwashing discussed.  Patient advised to followup with her PCP or opthalmology  for reevaluation in several days..  Patient verbalizes understanding and is agreeable with discharge.     Final Clinical Impressions(s) / ED Diagnoses   Final diagnoses:  Bacterial conjunctivitis of right eye    ED Discharge Orders    None       Kerrie Buffaloeese, Tayson Schnelle [place], NP 10/25/17 1212    Alvira MondaySchlossman, Erin, MD 10/26/17 [phone]

## 2017-10-25 NOTE — ED Notes (Signed)
Bed: WTR8 Expected date:  Expected time:  Means of arrival:  Comments: 

## 2017-10-25 NOTE — ED Triage Notes (Signed)
Patient reports having right eye pain and redness that started almost 2-3 days ago. Reports she started using the allergy eye drops and warm compress. Reports the past 24hrs began noticing eye drainage and increase in pain. Reports she was going to go to Urgent care but they advise to come here being they didn't open until 12noon. Patient denies pain at any other areas. Denies any trauma or irritation to eye.

## 2017-10-25 NOTE — ED Notes (Signed)
Went to vital signs pt not in room

## 2017-12-03 ENCOUNTER — Ambulatory Visit (INDEPENDENT_AMBULATORY_CARE_PROVIDER_SITE_OTHER): Payer: Medicare Other | Admitting: Allergy & Immunology

## 2017-12-03 ENCOUNTER — Encounter: Payer: Self-pay | Admitting: Allergy & Immunology

## 2017-12-03 VITALS — BP 148/78 | HR 103 | Temp 98.0°F | Resp 18

## 2017-12-03 DIAGNOSIS — J4541 Moderate persistent asthma with (acute) exacerbation: Secondary | ICD-10-CM

## 2017-12-03 DIAGNOSIS — J3089 Other allergic rhinitis: Secondary | ICD-10-CM

## 2017-12-03 DIAGNOSIS — T7800XD Anaphylactic reaction due to unspecified food, subsequent encounter: Secondary | ICD-10-CM

## 2017-12-03 MED ORDER — BUDESONIDE-FORMOTEROL FUMARATE 160-4.5 MCG/ACT IN AERO: 2.0000 | INHALATION_SPRAY | Freq: Two times a day (BID) | RESPIRATORY_TRACT | 5 refills | Status: DC

## 2017-12-03 NOTE — Progress Notes (Signed)
FOLLOW UP  Date of Service/Encounter:  12/03/17   Assessment:   Moderate persistent asthma with acute exacerbation  Anaphylactic shock due to food (shellfish)  Perennial and seasonal allergic rhinitis (grasses, weeds, ragweed, trees, molds, cat, dog, cockroach, and dust mite)  Current smoker  Non-compliance with medication regimen    Asthma Reportables:  Severity: moderate persistent  Risk: high Control: very poorly controlled  Plan/Recommendations:   1. Moderate persistent asthma with acute exacerbation - Lung testing looks terrible today, but you did improve with albuterol.  - We will start you on a prednisone taper. - It is very important that you use the Symbicort two puffs twice daily EVERY day. - We will call the pharmacy to make sure that all of your prescriptions are either purged or up to date.  - This is clearly very confusing for both Ms. Jesson and her daughter.  - Daily controller medication(s): Symbicort 160/4.475mcg two puffs twice daily with spacer - Prior to physical activity: ProAir 2 puffs 10-15 minutes before physical activity. - Rescue medications: ProAir 4 puffs every 4-6 hours as needed or albuterol nebulizer one vial puffs every 4-6 hours as needed - Asthma control goals:  * Full participation in all desired activities (may need albuterol before activity) * Albuterol use two time or less a week on average (not counting use with activity) * Cough interfering with sleep two time or less a month * Oral steroids no more than once a year * No hospitalizations  2. Anaphylactic shock due to food (shellfish) - Continue to avoid shellfish. - EpiPen is up to date.   3. Perennial and seasonal allergic rhinitis (grasses, weeds, ragweed, trees, molds, cat, dog, cockroach, and dust mite) - Continue with fluticasone nasal spray two sprays per nostril daily. - Continue with azelastine nasal spray two sprays per nostril daily. - Continue with levocetirizine  5mg  tablet once daily.  - Considering restarting allergy shots.   4. Return in about 3 months (around 03/03/2018).  Subjective:   [name] L Switala is a 42 y.o. female presenting today for follow up of  Chief Complaint  Patient presents with  . Asthma    Samone L [name] has a history of the following: Patient Active Problem List   Diagnosis Date Noted  . 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 Perennial and seasonal allergic rhinitis 08/19/2017  . Moderate persistent asthma 08/19/2017    History obtained from: chart review and patient.  Gregary Signsenee L Vanzanten's Primary Care Provider is Fleet ContrasAvbuere, Edwin, MD.     [name] is a 42 y.o. female presenting for a follow up visit. She was last seen in September 2018. At that time, she was seen as a new patient with Dr. Nunzio CobbsBobbitt for evaluation of an allergic reaction. She was diagnosed with a shellfish allergy and EpiPen was prescribed. She also had testing that was positive to grasses, weeds, ragweed, trees, molds, cat, dog, cockroach, and dust mite. She did started allergy shots, but only received one injection before stopping. She was diagnosed with moderate persistent asthma and started on Symbicort 80/4.5 two puffs BID with albuterol as needed.   Since the last visit, Ms. [name] has not done well. It is not clear that she has been using her controller medication regularly. While she does have her Symbicort with her today, she tells me that this is still her sample that she got at the last visit. However, the Symbicort she has today is 160/4.5, while Dr. Sheran FavaBobbitt's note  mentioned the 80/4.5 strength. In any case, her ACT score today is 5 today indicating terrible asthma control. She tells me that she has been short of breath since I saw her daughter last in November 2018. She has not needed to go to the ED, but she has been using her rescue inhaler throughout the day. It is not clear where she is getting all of these rescue  inhalers since we typically do not provide multiple refills.   Allergic rhinitis is somewhat well controlled and she is using both of her nasal sprays as well as the antihistamine. She did do one allergy injection, but she felt that she broke out after the injection so she never came back. She continues to avoid shellfish, although she is not happy with this, as she loves shellfish. She continues to smoke, but is down to one pack every three days. She is aiming to stop by May 2019 when her grandbaby is due.   Otherwise, there have been no changes to her past medical history, surgical history, family history, or social history.    Review of Systems: a 14-point review of systems is pertinent for what is mentioned in HPI.  Otherwise, all other systems were negative. Constitutional: negative other than that listed in the HPI Eyes: negative other than that listed in the HPI Ears, nose, mouth, throat, and face: negative other than that listed in the HPI Respiratory: negative other than that listed in the HPI Cardiovascular: negative other than that listed in the HPI Gastrointestinal: negative other than that listed in the HPI Genitourinary: negative other than that listed in the HPI Integument: negative other than that listed in the HPI Hematologic: negative other than that listed in the HPI Musculoskeletal: negative other than that listed in the HPI Neurological: negative other than that listed in the HPI Allergy/Immunologic: negative other than that listed in the HPI    Objective:   Blood pressure (!) 148/78, pulse (!) 103, temperature 98 F (36.7 C), temperature source Oral, resp. rate 18, SpO2 96 %. There is no height or weight on file to calculate BMI.   Physical Exam:  General: Alert, interactive, in no acute distress. Obese female. Friendly.  Eyes: No conjunctival injection bilaterally, no discharge on the right, no discharge on the left and no Horner-Trantas dots present. PERRL  bilaterally. EOMI without pain. No photophobia.  Ears: Right TM pearly gray with normal light reflex, Left TM pearly gray with normal light reflex, Right TM intact without perforation and Left TM intact without perforation.  Nose/Throat: External nose within normal limits, nasal crease present and septum midline. Turbinates markedly edematous with clear discharge. Posterior oropharynx erythematous without cobblestoning in the posterior oropharynx. Tonsils 2+ without exudates.  Tongue without thrush. Adenopathy: no enlarged lymph nodes appreciated in the anterior cervical, occipital, axillary, epitrochlear, inguinal, or popliteal regions. Lungs: Decreased breath sounds with expiratory wheezing bilaterally. Increased work of breathing. CV: Normal S1/S2. No murmurs. Capillary refill <2 seconds.  Skin: Warm and dry, without lesions or rashes. Neuro:   Grossly intact. No focal deficits appreciated. Responsive to questions.  Diagnostic studies:   Spirometry: results abnormal (FEV1: 1.48/47%, FVC: 1.98/52%, FEV1/FVC: 75%).    Spirometry consistent with possible restrictive disease. Albuterol/Atrovent nebulizer treatment given in clinic with significant improvement in FEV1 and FVC per ATS criteria.The FEV1 increased and the FVC increased . Values nearly normalized with the albuterol nebulizer treatment.   Allergy Studies: none      Malachi Bonds, MD [facility]

## 2017-12-03 NOTE — Patient Instructions (Addendum)
1. Moderate persistent asthma with acute exacerbation - Lung testing looks terrible today, but you did improve with albuterol.  - We will start you on a prednisone taper. - It is very important that you use the Symbicort two puffs twice daily EVERY day. - Daily controller medication(s): Symbicort 160/4.625mcg two puffs twice daily with spacer - Prior to physical activity: ProAir 2 puffs 10-15 minutes before physical activity. - Rescue medications: ProAir 4 puffs every 4-6 hours as needed or albuterol nebulizer one vial puffs every 4-6 hours as needed - Asthma control goals:  * Full participation in all desired activities (may need albuterol before activity) * Albuterol use two time or less a week on average (not counting use with activity) * Cough interfering with sleep two time or less a month * Oral steroids no more than once a year * No hospitalizations  2. Anaphylactic shock due to food (shellfish) - Continue to avoid shellfish. - EpiPen is up to date.   3. Perennial and seasonal allergic rhinitis (grasses, weeds, ragweed, trees, molds, cat, dog, cockroach, and dust mite) - Continue with fluticasone nasal spray two sprays per nostril daily. - Continue with azelastine nasal spray two sprays per nostril daily. - Continue with levocetirizine 5mg  tablet once daily.  - Considering restarting allergy shots.   4. Return in about 3 months (around 03/03/2018).   Please inform us of any Emergency Department visits, hospitalizations, or changes in symptoms. Call us before going to the ED for breathing or allergy symptoms since we might be able to fit you in for a sick visit. Feel free to contact us anytime with any questions, problems, or concerns.  It was a pleasure to see you and your family again today! Happy New Year!   Websites that have reliable patient information: 1. American Academy of Asthma, Allergy, and Immunology: www.aaaai.org 2. Food Allergy Research and Education (FARE):  foodallergy.org 3. Mothers of Asthmatics: http://www.asthmacommunitynetwork.org 4. American College of Allergy, Asthma, and Immunology: www.acaai.org

## 2017-12-03 NOTE — Addendum Note (Signed)
Addended by: Clarene CritchleySMITH, Melissa Pulido G on: 12/03/2017 03:11 PM   Modules accepted: Orders

## 2017-12-05 NOTE — Addendum Note (Signed)
Addended by: Dub MikesHICKS, ASHLEY N on: 12/05/2017 08:40 AM   Modules accepted: Orders

## 2018-01-02 ENCOUNTER — Other Ambulatory Visit: Payer: Self-pay

## 2018-01-02 DIAGNOSIS — J3089 Other allergic rhinitis: Secondary | ICD-10-CM

## 2018-01-02 MED ORDER — AZELASTINE HCL 0.1 % NA SOLN: 2.0000 | Freq: Two times a day (BID) | NASAL | 5 refills | Status: DC

## 2018-01-02 MED ORDER — FLUTICASONE PROPIONATE 50 MCG/ACT NA SUSP: 2.0000 | Freq: Every day | NASAL | 3 refills | Status: DC

## 2018-03-11 ENCOUNTER — Ambulatory Visit (INDEPENDENT_AMBULATORY_CARE_PROVIDER_SITE_OTHER): Payer: Medicare Other | Admitting: Allergy & Immunology

## 2018-03-11 ENCOUNTER — Encounter: Payer: Self-pay | Admitting: Allergy & Immunology

## 2018-03-11 VITALS — BP 132/78 | HR 98 | Resp 20

## 2018-03-11 DIAGNOSIS — J4541 Moderate persistent asthma with (acute) exacerbation: Secondary | ICD-10-CM

## 2018-03-11 DIAGNOSIS — J3089 Other allergic rhinitis: Secondary | ICD-10-CM | POA: Diagnosis not present

## 2018-03-11 DIAGNOSIS — T7800XD Anaphylactic reaction due to unspecified food, subsequent encounter: Secondary | ICD-10-CM | POA: Diagnosis not present

## 2018-03-11 MED ORDER — LEVOCETIRIZINE DIHYDROCHLORIDE 5 MG PO TABS: 5.0000 mg | ORAL_TABLET | Freq: Every day | ORAL | 5 refills | Status: DC | PRN

## 2018-03-11 MED ORDER — ALBUTEROL SULFATE HFA 108 (90 BASE) MCG/ACT IN AERS: 2.0000 | INHALATION_SPRAY | RESPIRATORY_TRACT | 1 refills | Status: DC | PRN

## 2018-03-11 NOTE — Progress Notes (Signed)
FOLLOW UP  Date of Service/Encounter:  03/11/18   Assessment:   Moderate persistent asthma with acute exacerbation   Perennial and seasonal allergic rhinitis (grasses, weeds, ragweed, trees, molds, cat, dog, cockroach, and dust mite)  Allergic conjunctivitis - followed by an optometrist  Anaphylactic shock due to food (shellfish)    Asthma Reportables:  Severity: moderate persistent  Risk: high Control: not well controlled   Plan/Recommendations:   1. Moderate persistent asthma with acute exacerbation - Lung testing looks terrible today, but you did improve with albuterol treatment. - It is VERY important that you take your asthma medication twice daily EVERY day. - We cannot do allergy shots if your breathing continues to be under poor control.  - We will send in the prescription again, but please give us a call if there is a problem at the pharmacy.  - Prednisone treatment deferred due to recent course of prednisone at the last visit.  - She also was not visually in distress.  - Daily controller medication(s): Symbicort 160/4.655mcg two puffs twice daily with spacer   - Prior to physical activity: ProAir 2 puffs 10-15 minutes before physical activity. - Rescue medications: ProAir 4 puffs every 4-6 hours as needed or albuterol nebulizer one vial puffs every 4-6 hours as needed - Asthma control goals:  * Full participation in all desired activities (may need albuterol before activity) * Albuterol use two time or less a week on average (not counting use with activity) * Cough interfering with sleep two time or less a month * Oral steroids no more than once a year * No hospitalizations  2. Anaphylactic shock due to food (shellfish) - Continue to avoid shellfish. - We will get lab work to look into this more.  - EpiPen is up to date.   3. Perennial and seasonal allergic rhinitis (grasses, weeds, ragweed, trees, molds, cat, dog, cockroach, and dust mite) - Continue with  fluticasone nasal spray two sprays per nostril daily. - Continue with azelastine nasal spray two sprays per nostril daily. - Continue with levocetirizine 5mg  tablet once daily.  - Add on montelukast (Singulair) 10mg  daily.  - We can restart allergy shots once her asthma is under better control.  - This would certainly help with her ocular symptoms.  - I recommended continuing with the drops as recommended by her optometrist.   4. Return in about 5 days (around 03/16/2018).  Subjective:   Albin FellingRenee L Garciagarcia is a 42 y.o. female presenting today for follow up of  Chief Complaint  Patient presents with  . Allergic Rhinitis     Shacola L Brendia Sacksritchard has a history of the following: Patient Active Problem List   Diagnosis Date Noted  . 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 Perennial and seasonal allergic rhinitis 08/19/2017  . Moderate persistent asthma 08/19/2017    History obtained from: chart review and patient.  Gregary Signsenee L Pfeifer's Primary Care Provider is Fleet ContrasAvbuere, Edwin, MD.     Luster LandsbergRenee is a 42 y.o. female presenting for a follow up visit.  She was last seen in January 2019.  At that time, her lung testing looked awful, but did improve with albuterol.  We started her on a prednisone taper.  I had a reiterated the need to use her Symbicort 2 puffs twice daily every day for the best control.  She has a history of anaphylaxis to shellfish, and we continued her EpiPen for this.  She also has a history of perennial  and seasonal allergic rhinitis with sensitizations to grasses, weeds, ragweed, trees, molds, cat, dog, cockroach, and dust mite.  We continued her on Flonase as well as as a lasting and Xyzal.  She has been on shots in the past, but never received more than a few injections because she felt that she broke out.  Since the last visit, she tells me today that she has done well.  However, upon further questioning, it seems that she is not using her controller  medication at all.  She still tells me today that she is using the "blue one", by which I assume she means Dulera.  The problem is that she has not been on [facility] for well over 6 months.  She does seem to remember the name Symbicort, but reports that she gave her inhaler to her daughter to use instead.  It is rather unclear whether they are not picking up their meds or there is actually a pharmacy issue.  She did they tell me today that the pharmacy did not have the medications after the last visit, and the pharmacy told them that they would take care of it.  However there are no phone messages to point towards an issue with refilling the medication with the pharmacy.  Regarding her control, she continues to use her albuterol on a daily basis.  Her ACT today is6, indicating very poor control.  She does have frequent nighttime awakenings.  She has continued to smoke despite her attempts to quit. Her daughter is going to be having a baby next week and she is motivated to stop quitting for her grandchild.   She continues to want to eat shellfish and is requesting retesting. It has been around one year. She is able to eat other shellfish without a problem, but evidently she does avoid crab. However review of her testing in September 2018 demonstrated positives to shrimp, crab, and scallops. She clearly does not know which type of shellfish to avoid. In any case, she has been eating other certain shellfish without a problem.   Otherwise, there have been no changes to her past medical history, surgical history, family history, or social history.    Review of Systems: a 14-point review of systems is pertinent for what is mentioned in HPI.  Otherwise, all other systems were negative. Constitutional: negative other than that listed in the HPI Eyes: negative other than that listed in the HPI Ears, nose, mouth, throat, and face: negative other than that listed in the HPI Respiratory: negative other than that  listed in the HPI Cardiovascular: negative other than that listed in the HPI Gastrointestinal: negative other than that listed in the HPI Genitourinary: negative other than that listed in the HPI Integument: negative other than that listed in the HPI Hematologic: negative other than that listed in the HPI Musculoskeletal: negative other than that listed in the HPI Neurological: negative other than that listed in the HPI Allergy/Immunologic: negative other than that listed in the HPI    Objective:   Blood pressure 132/78, pulse 98, resp. rate 20, SpO2 97 %. There is no height or weight on file to calculate BMI.   Physical Exam:  General: Alert, interactive, in no acute distress. Obese female.  Talkative.  Eyes: No conjunctival injection bilaterally, no discharge on the right, no discharge on the left and no Horner-Trantas dots present. PERRL bilaterally. EOMI without pain. No photophobia.  Ears: Right TM pearly gray with normal light reflex, Left TM pearly gray with normal  light reflex, Right TM intact without perforation and Left TM intact without perforation.  Nose/Throat: External nose within normal limits and septum midline. Turbinates edematous and pale with clear discharge. Posterior oropharynx erythematous with cobblestoning in the posterior oropharynx. Tonsils 3+ without exudates.  Tongue without thrush and Geographic tongue present. Lungs: Decreased breath sounds bilaterally without wheezing, rhonchi or rales. No increased work of breathing. CV: Normal S1/S2. No murmurs. Capillary refill <2 seconds.  Skin: Warm and dry, without lesions or rashes. Neuro:   Grossly intact. No focal deficits appreciated. Responsive to questions.  Diagnostic studies:   Spirometry: results abnormal (FEV1: 1.57/50%, FVC: 2.45/64%, FEV1/FVC: 64%).    Spirometry consistent with mixed obstructive and restrictive disease. Albuterol/Atrovent nebulizer treatment given in clinic with significant  improvement in FEV1 and FVC per ATS criteria. We deferred on treatment with prednisone since we had recently given her a burst. I anticipate that once she is back on her Symbicort, symptoms will improve.   Allergy Studies: none    Malachi Bonds, MD [facility]

## 2018-03-11 NOTE — Patient Instructions (Addendum)
1. Moderate persistent asthma with acute exacerbation - Lung testing looks terrible today, but you did improve with albuterol treatment. - It is VERY important that you take your asthma medication twice daily EVERY day. - We cannot do allergy shots if your breathing continues to be under poor control.  - We will send in the prescription again, but please give us a call if there is a problem at the pharmacy.  - Daily controller medication(s): Symbicort 160/4.435mcg two puffs twice daily with spacer     - Prior to physical activity: ProAir 2 puffs 10-15 minutes before physical activity. - Rescue medications: ProAir 4 puffs every 4-6 hours as needed or albuterol nebulizer one vial puffs every 4-6 hours as needed - Asthma control goals:  * Full participation in all desired activities (may need albuterol before activity) * Albuterol use two time or less a week on average (not counting use with activity) * Cough interfering with sleep two time or less a month * Oral steroids no more than once a year * No hospitalizations  2. Anaphylactic shock due to food (shellfish) - Continue to avoid shellfish. - We will get lab work to look into this more.  - EpiPen is up to date.   3. Perennial and seasonal allergic rhinitis (grasses, weeds, ragweed, trees, molds, cat, dog, cockroach, and dust mite) - Continue with fluticasone nasal spray two sprays per nostril daily. - Continue with azelastine nasal spray two sprays per nostril daily. - Continue with levocetirizine 5mg  tablet once daily.  - Add on montelukast (Singulair) 10mg  daily.   4. Return in about 5 days (around 03/16/2018).   Please inform us of any Emergency Department visits, hospitalizations, or changes in symptoms. Call us before going to the ED for breathing or allergy symptoms since we might be able to fit you in for a sick visit. Feel free to contact us anytime with any questions, problems, or concerns.  It was a pleasure to see you and  your family again today!  Websites that have reliable patient information: 1. American Academy of Asthma, Allergy, and Immunology: www.aaaai.org 2. Food Allergy Research and Education (FARE): foodallergy.org 3. Mothers of Asthmatics: http://www.asthmacommunitynetwork.org 4. American College of Allergy, Asthma, and Immunology: www.acaai.org

## 2018-03-12 ENCOUNTER — Encounter: Payer: Self-pay | Admitting: Allergy & Immunology

## 2018-03-16 ENCOUNTER — Ambulatory Visit: Payer: Medicare Other | Admitting: Allergy & Immunology

## 2018-03-18 LAB — ALLERGEN PROFILE, SHELLFISH
CLAM IGE: 2.09 kU/L — AB
F023-IGE CRAB: 1.73 kU/L — AB
F080-IGE LOBSTER: 2.54 kU/L — AB
F290-IGE OYSTER: 0.54 kU/L — AB
SCALLOP IGE: 3.52 kU/L — AB
Shrimp IgE: 8.7 kU/L — AB

## 2018-03-19 ENCOUNTER — Ambulatory Visit: Payer: Medicare Other | Admitting: Allergy & Immunology

## 2018-03-25 ENCOUNTER — Ambulatory Visit (INDEPENDENT_AMBULATORY_CARE_PROVIDER_SITE_OTHER): Payer: Medicare Other | Admitting: Allergy & Immunology

## 2018-03-25 ENCOUNTER — Encounter: Payer: Self-pay | Admitting: Allergy & Immunology

## 2018-03-25 VITALS — BP 124/84 | HR 100 | Resp 20

## 2018-03-25 DIAGNOSIS — J3089 Other allergic rhinitis: Secondary | ICD-10-CM | POA: Diagnosis not present

## 2018-03-25 DIAGNOSIS — T7800XD Anaphylactic reaction due to unspecified food, subsequent encounter: Secondary | ICD-10-CM

## 2018-03-25 DIAGNOSIS — J309 Allergic rhinitis, unspecified: Secondary | ICD-10-CM

## 2018-03-25 DIAGNOSIS — Z9119 Patient's noncompliance with other medical treatment and regimen: Secondary | ICD-10-CM

## 2018-03-25 DIAGNOSIS — J4540 Moderate persistent asthma, uncomplicated: Secondary | ICD-10-CM

## 2018-03-25 DIAGNOSIS — J302 Other seasonal allergic rhinitis: Secondary | ICD-10-CM | POA: Insufficient documentation

## 2018-03-25 DIAGNOSIS — Z91199 Patient's noncompliance with other medical treatment and regimen due to unspecified reason: Secondary | ICD-10-CM

## 2018-03-25 NOTE — Patient Instructions (Addendum)
1. Moderate persistent asthma, uncomplicated - Your lung testing looked much better today. - We will not make any changes at this time.  - We will send in the prescription again, but please give [place] a call if there is a problem at the pharmacy.  - Daily controller medication(s): Symbicort 160/4.36mcg two puffs twice daily with spacer  - Prior to physical activity: ProAir 2 puffs 10-15 minutes before physical activity. - Rescue medications: ProAir 4 puffs every 4-6 hours as needed or albuterol nebulizer one vial puffs every 4-6 hours as needed - Asthma control goals:  * Full participation in all desired activities (may need albuterol before activity) * Albuterol use two time or less a week on average (not counting use with activity) * Cough interfering with sleep two time or less a month * Oral steroids no more than once a year * No hospitalizations  2. Anaphylactic shock due to food (shellfish) -  Labs were still very elevated, so I would definitely avoid.  - EpiPen is up to date.   3. Perennial and seasonal allergic rhinitis (grasses, weeds, ragweed, trees, molds, cat, dog, cockroach, and dust mite) - Continue with fluticasone nasal spray two sprays per nostril daily. - Continue with azelastine nasal spray two sprays per nostril daily. - Continue with levocetirizine  tablet once daily.  - Continue with montelukast (Singulair)  daily.  - Allergy shots restarted today.  - Come back 1-2 times weekly to build up.   4. Return in about 3 months (around 06/25/2018). BRING [place] WITH YOU!    Please inform us of any Emergency Department visits, hospitalizations, or changes in symptoms. Call us before going to the ED for breathing or allergy symptoms since we might be able to fit you in for a sick visit. Feel free to contact us anytime with any questions, problems, or concerns.  It was a pleasure to see you and your family again today!  Websites that have reliable patient  information: 1. American Academy of Asthma, Allergy, and Immunology: www.aaaai.org 2. Food Allergy Research and Education (FARE): foodallergy.org 3. Mothers of Asthmatics: http://www.asthmacommunitynetwork.org 4. American College of Allergy, Asthma, and Immunology: www.acaai.org

## 2018-03-25 NOTE — Progress Notes (Signed)
FOLLOW UP  Date of Service/Encounter:  03/25/18   Assessment:   Seasonal and perennial allergic rhinitis (grasses, weeds, ragweed, trees, molds, cat, dog, cockroach, and dust mite)  Moderate persistent asthma, uncomplicated  Anaphylactic shock due to food  Non-compliance   Asthma Reportables:  Severity: moderate persistent  Risk: high Control: well controlled   Plan/Recommendations:   1. Moderate persistent asthma, uncomplicated - Your lung testing looked much better today. - We will not make any changes at this time.  - We will send in the prescription again, but please give [place] a call if there is a problem at the pharmacy.  - Daily controller medication(s): Symbicort 160/4.4mcg two puffs twice daily with spacer  - Prior to physical activity: ProAir 2 puffs 10-15 minutes before physical activity. - Rescue medications: ProAir 4 puffs every 4-6 hours as needed or albuterol nebulizer one vial puffs every 4-6 hours as needed - Asthma control goals:  * Full participation in all desired activities (may need albuterol before activity) * Albuterol use two time or less a week on average (not counting use with activity) * Cough interfering with sleep two time or less a month * Oral steroids no more than once a year * No hospitalizations  2. Anaphylactic shock due to food (shellfish) -  Labs were still very elevated, so I would definitely avoid.  - EpiPen is up to date.   3. Perennial and seasonal allergic rhinitis (grasses, weeds, ragweed, trees, molds, cat, dog, cockroach, and dust mite) - Continue with fluticasone nasal spray two sprays per nostril daily. - Continue with azelastine nasal spray two sprays per nostril daily. - Continue with levocetirizine  tablet once daily.  - Continue with montelukast (Singulair)  daily.  - Allergy shots restarted today.  - Come back 1-2 times weekly to build up.   4. Return in about 3 months (around 06/25/2018).   Subjective:     [name] is a 42 y.o. female presenting today for follow up of  Chief Complaint  Patient presents with  . Asthma    [name] has a history of the following: Patient Active Problem List   Diagnosis Date Noted  . 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 Perennial and seasonal allergic rhinitis 08/19/2017  . Moderate persistent asthma 08/19/2017    History obtained from: chart review and patient.  Gregary Signs Gaut's Primary Care Provider is Fleet Contras, MD.     [name] is a 42 y.o. female presenting for a sick visit. He was last seen two weeks ago. At that time, her lung testing looked terrible due to non-compliance. She did have improvement with her albuterol nebulizer treatment. We sent in prescriptions (Symbicort 160/4.5 two puffs twice daily and ProAir as needed. She has a food allergy to shellfish. We asked her to come back in two weeks to ensure that she would maintain her compliance and restart her allergy shots.   Since the last visit, she has done well. She did pick up her Symbicort and has been using this as recommended. [name]'s asthma has been well controlled. She has not required rescue medication, experienced nocturnal awakenings due to lower respiratory symptoms, nor have activities of daily living been limited. She has required no Emergency Department or Urgent Care visits for her asthma. She has required zero courses of systemic steroids for asthma exacerbations since the last visit. ACT score today is 21, indicating excellent asthma symptom control.   She is on her  nasal sprays but ius using the fluticasone only as needed. She is interested in restarting her allergy shots today. She does have an up to date EpiPen. She did not get her MyChart message about the shellfish panel, but I did relate the results to her today. I recommended continued avoidance of shellfish.   In the interim, her daughter gave birth to a boy, whom they named  [name]. He is rather adorable.   Otherwise, there have been no changes to her past medical history, surgical history, family history, or social history.    Review of Systems: a 14-point review of systems is pertinent for what is mentioned in HPI.  Otherwise, all other systems were negative. Constitutional: negative other than that listed in the HPI Eyes: negative other than that listed in the HPI Ears, nose, mouth, throat, and face: negative other than that listed in the HPI Respiratory: negative other than that listed in the HPI Cardiovascular: negative other than that listed in the HPI Gastrointestinal: negative other than that listed in the HPI Genitourinary: negative other than that listed in the HPI Integument: negative other than that listed in the HPI Hematologic: negative other than that listed in the HPI Musculoskeletal: negative other than that listed in the HPI Neurological: negative other than that listed in the HPI Allergy/Immunologic: negative other than that listed in the HPI    Objective:   Blood pressure 124/84, pulse 100, resp. rate 20. There is no height or weight on file to calculate BMI.   Physical Exam:  General: Alert, interactive, in no acute distress. Pleasant female. Talkative. Obese.  Eyes: No conjunctival injection bilaterally, no discharge on the right, no discharge on the left and no Horner-Trantas dots present. PERRL bilaterally. EOMI without pain. No photophobia.  Ears: Right TM pearly gray with normal light reflex, Left TM pearly gray with normal light reflex, Right TM intact without perforation and Left TM intact without perforation.  Nose/Throat: External nose within normal limits and septum midline. Turbinates edematous and pale with clear discharge. Posterior oropharynx erythematous with cobblestoning in the posterior oropharynx. Tonsils 2+ without exudates.  Tongue without thrush. Lungs: Clear to auscultation without wheezing, rhonchi or rales.  No increased work of breathing. CV: Normal S1/S2. No murmurs. Capillary refill <2 seconds.  Skin: Warm and dry, without lesions or rashes. Neuro:   Grossly intact. No focal deficits appreciated. Responsive to questions.  Diagnostic studies:   Spirometry: results abnormal (FEV1: 1.79/62%, FVC: 2.56/72%, FEV1/FVC: 70%).    Spirometry consistent with possible restrictive disease.   Allergy Studies: none     Malachi Bonds, MD  [facility]

## 2018-04-01 ENCOUNTER — Other Ambulatory Visit: Payer: Self-pay | Admitting: Allergy and Immunology

## 2018-05-27 ENCOUNTER — Other Ambulatory Visit: Payer: Self-pay | Admitting: Allergy & Immunology

## 2018-07-03 ENCOUNTER — Other Ambulatory Visit: Payer: Self-pay | Admitting: Allergy and Immunology

## 2018-07-03 DIAGNOSIS — J3089 Other allergic rhinitis: Secondary | ICD-10-CM

## 2018-09-10 ENCOUNTER — Ambulatory Visit: Payer: Medicare Other | Admitting: Allergy & Immunology

## 2018-11-09 ENCOUNTER — Other Ambulatory Visit: Payer: Self-pay | Admitting: Internal Medicine

## 2018-11-09 DIAGNOSIS — Z1231 Encounter for screening mammogram for malignant neoplasm of breast: Secondary | ICD-10-CM

## 2018-12-09 ENCOUNTER — Ambulatory Visit: Payer: Medicare HMO

## 2019-01-08 ENCOUNTER — Emergency Department (HOSPITAL_COMMUNITY)
Admission: EM | Admit: 2019-01-08 | Discharge: 2019-01-08 | Disposition: A | Discharge status: Home / Self Care | Payer: Medicare HMO | Attending: Emergency Medicine | Admitting: Emergency Medicine

## 2019-01-08 ENCOUNTER — Other Ambulatory Visit: Payer: Self-pay

## 2019-01-08 ENCOUNTER — Emergency Department (HOSPITAL_COMMUNITY): Payer: Medicare HMO

## 2019-01-08 ENCOUNTER — Encounter (HOSPITAL_COMMUNITY): Payer: Self-pay

## 2019-01-08 DIAGNOSIS — J039 Acute tonsillitis, unspecified: Secondary | ICD-10-CM | POA: Insufficient documentation

## 2019-01-08 DIAGNOSIS — Z79899 Other long term (current) drug therapy: Secondary | ICD-10-CM | POA: Diagnosis not present

## 2019-01-08 DIAGNOSIS — F17210 Nicotine dependence, cigarettes, uncomplicated: Secondary | ICD-10-CM | POA: Insufficient documentation

## 2019-01-08 DIAGNOSIS — J45909 Unspecified asthma, uncomplicated: Secondary | ICD-10-CM | POA: Diagnosis not present

## 2019-01-08 DIAGNOSIS — J353 Hypertrophy of tonsils with hypertrophy of adenoids: Secondary | ICD-10-CM | POA: Diagnosis not present

## 2019-01-08 DIAGNOSIS — R69 Illness, unspecified: Secondary | ICD-10-CM | POA: Diagnosis not present

## 2019-01-08 DIAGNOSIS — R070 Pain in throat: Secondary | ICD-10-CM | POA: Diagnosis present

## 2019-01-08 DIAGNOSIS — J029 Acute pharyngitis, unspecified: Secondary | ICD-10-CM

## 2019-01-08 LAB — CBC WITH DIFFERENTIAL/PLATELET
Abs Immature Granulocytes: 0.15 10*3/uL — ABNORMAL HIGH (ref 0.00–0.07)
Basophils Absolute: 0 10*3/uL (ref 0.0–0.1)
Basophils Relative: 0 %
EOS ABS: 0.1 10*3/uL (ref 0.0–0.5)
Eosinophils Relative: 0 %
HCT: 35.2 % — ABNORMAL LOW (ref 36.0–46.0)
Hemoglobin: 10.5 g/dL — ABNORMAL LOW (ref 12.0–15.0)
Immature Granulocytes: 1 %
Lymphocytes Relative: 6 %
Lymphs Abs: 1.4 10*3/uL (ref 0.7–4.0)
MCH: 22.9 pg — ABNORMAL LOW (ref 26.0–34.0)
MCHC: 29.8 g/dL — ABNORMAL LOW (ref 30.0–36.0)
MCV: 76.7 fL — ABNORMAL LOW (ref 80.0–100.0)
Monocytes Absolute: 1.2 10*3/uL — ABNORMAL HIGH (ref 0.1–1.0)
Monocytes Relative: 6 %
NRBC: 0 % (ref 0.0–0.2)
Neutro Abs: 18.4 10*3/uL — ABNORMAL HIGH (ref 1.7–7.7)
Neutrophils Relative %: 87 %
Platelets: 226 10*3/uL (ref 150–400)
RBC: 4.59 MIL/uL (ref 3.87–5.11)
RDW: 16.4 % — AB (ref 11.5–15.5)
WBC: 21.3 10*3/uL — ABNORMAL HIGH (ref 4.0–10.5)

## 2019-01-08 LAB — COMPREHENSIVE METABOLIC PANEL
ALT: 15 U/L (ref 0–44)
AST: 32 U/L (ref 15–41)
Albumin: 2.1 g/dL — ABNORMAL LOW (ref 3.5–5.0)
Alkaline Phosphatase: 114 U/L (ref 38–126)
Anion gap: 10 (ref 5–15)
BUN: <5 mg/dL — ABNORMAL LOW (ref 6–20)
CO2: 24 mmol/L (ref 22–32)
Calcium: 8.6 mg/dL — ABNORMAL LOW (ref 8.9–10.3)
Chloride: 100 mmol/L (ref 98–111)
Creatinine, Ser: 0.45 mg/dL (ref 0.44–1.00)
GFR calc non Af Amer: >60 mL/min (ref >60)
Glucose, Bld: 115 mg/dL — ABNORMAL HIGH (ref 70–99)
Potassium: 3.1 mmol/L — ABNORMAL LOW (ref 3.5–5.1)
SODIUM: 134 mmol/L — AB (ref 135–145)
Total Bilirubin: 0.8 mg/dL (ref 0.3–1.2)
Total Protein: 8.6 g/dL — ABNORMAL HIGH (ref 6.5–8.1)

## 2019-01-08 LAB — I-STAT BETA HCG BLOOD, ED (MC, WL, AP ONLY): I-stat hCG, quantitative: <5 m[IU]/mL (ref <5)

## 2019-01-08 LAB — MONONUCLEOSIS SCREEN: Mono Screen: NEGATIVE

## 2019-01-08 LAB — GROUP A STREP BY PCR: Group A Strep by PCR: NOT DETECTED

## 2019-01-08 MED ORDER — HYDROCODONE-ACETAMINOPHEN 7.5-325 MG/15ML PO SOLN
10.0000 mL | Freq: Once | ORAL | Status: AC
  Administered 2019-01-08: 10 mL via ORAL
  Filled 2019-01-08: qty 15

## 2019-01-08 MED ORDER — CLINDAMYCIN PHOSPHATE 600 MG/50ML IV SOLN
600.0000 mg | Freq: Once | INTRAVENOUS | Status: AC
  Administered 2019-01-08: 600 mg via INTRAVENOUS
  Filled 2019-01-08: qty 50

## 2019-01-08 MED ORDER — IOHEXOL 300 MG/ML  SOLN
100.0000 mL | Freq: Once | INTRAMUSCULAR | Status: AC | PRN
  Administered 2019-01-08: 100 mL via INTRAVENOUS

## 2019-01-08 MED ORDER — LACTATED RINGERS IV BOLUS
1000.0000 mL | Freq: Once | INTRAVENOUS | Status: AC
  Administered 2019-01-08: 1000 mL via INTRAVENOUS

## 2019-01-08 MED ORDER — DEXAMETHASONE SODIUM PHOSPHATE 10 MG/ML IJ SOLN
10.0000 mg | Freq: Once | INTRAMUSCULAR | Status: AC
  Administered 2019-01-08: 10 mg via INTRAVENOUS
  Filled 2019-01-08: qty 1

## 2019-01-08 MED ORDER — KETOROLAC TROMETHAMINE 30 MG/ML IJ SOLN
30.0000 mg | Freq: Once | INTRAMUSCULAR | Status: AC
  Administered 2019-01-08: 30 mg via INTRAVENOUS
  Filled 2019-01-08: qty 1

## 2019-01-08 MED ORDER — KETOROLAC TROMETHAMINE 30 MG/ML IJ SOLN: 30.0000 mg | Freq: Once | INTRAMUSCULAR | Status: DC

## 2019-01-08 MED ORDER — CLINDAMYCIN HCL 150 MG PO CAPS: 300.0000 mg | ORAL_CAPSULE | Freq: Three times a day (TID) | ORAL | 0 refills | Status: AC

## 2019-01-08 NOTE — ED Provider Notes (Signed)
[facility]ENCY [facility] Provider Note   CSN: [id number] Arrival date & time: 01/08/19  1528     History   Chief Complaint Chief Complaint  Patient presents with  . Sore Throat    HPI [name] is a 43 y.o. female.  43yo F w/ PMH including asthma, hydradenitis who p/w throat swelling. 2 days ago, she began having sore throat, pain with swallowing, and swelling in throat. It has progressed since it began and she reports some breathing problems due to the swelling. Occasional cough, no fevers, vomiting, or recent travel. Grandson recently sick with a cold. She was concerned about possible allergic reaction because she has a history of anaphylaxis to shellfish, is not sure whether she was exposed to shellfish recently.  She denies any rash or other associated symptoms.  The history is provided by the patient.  Sore Throat     Past Medical History:  Diagnosis Date  . Asthma   . Hydradenitis   . Obesity     Patient Active Problem List   Diagnosis Date Noted  . Seasonal and perennial allergic rhinitis 03/25/2018  . 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 Perennial and seasonal allergic rhinitis 08/19/2017  . Moderate persistent asthma 08/19/2017    Past Surgical History:  Procedure Laterality Date  . CESAREAN SECTION     x2  . WISDOM TOOTH EXTRACTION  2011     OB History   No obstetric history on file.      Home Medications    Prior to Admission medications   Medication Sig Start Date End Date Taking? Authorizing Provider  albuterol (PROVENTIL HFA;VENTOLIN HFA) 108 (90 Base) MCG/ACT inhaler Inhale 2 puffs into the lungs every 4 (four) hours as needed for wheezing or shortness of breath. 03/11/18  Yes Alfonse SpruceGallagher, Joel Louis, MD  esomeprazole (NEXIUM) 40 MG capsule Take 40 mg by mouth daily. 07/26/17  Yes [provider]  SYMBICORT 160-4.5 MCG/ACT inhaler TAKE 2 PUFFS BY MOUTH TWICE A DAY Patient taking  differently: Inhale 2 puffs into the lungs 2 (two) times daily.  05/27/18  Yes Alfonse SpruceGallagher, Joel Louis, MD  azelastine (ASTELIN) 0.1 % nasal spray USE 2 SPRAYS INTO BOTH NOSTRILS 2 TIMES DAILY. Patient not taking: Reported on 01/08/2019 07/03/18   Alfonse SpruceGallagher, Joel Louis, MD  benzonatate (TESSALON) 200 MG capsule Take 1 capsule (200 mg total) by mouth 3 (three) times daily as needed for cough. Patient not taking: Reported on 01/08/2019 08/05/17   Domenick GongMortenson, Ashley, MD  clindamycin (CLEOCIN) 150 MG capsule Take 2 capsules (300 mg total) by mouth 3 (three) times daily for 10 days. 01/08/19 01/18/19  , Ambrose Finlandachel Morgan, MD  fluticasone [facility](FLONASE) 50 MCG/ACT nasal spray USE 2 SPRAYS INTO EACH NOSTRIL EVERY DAY Patient not taking: Reported on 01/08/2019 07/03/18   Alfonse SpruceGallagher, Joel Louis, MD  ipratropium (ATROVENT) 0.06 % nasal spray Place 2 sprays into both nostrils 4 (four) times daily. 3-4 times/ day Patient not taking: Reported on 01/08/2019 08/05/17   Domenick GongMortenson, Ashley, MD  levocetirizine (XYZAL) 5 MG tablet Take 1 tablet (5 mg total) by mouth daily as needed for allergies. Patient not taking: Reported on 01/08/2019 03/11/18   Alfonse SpruceGallagher, Joel Louis, MD  oxymetazoline (AFRIN) 0.05 % nasal spray Place 1 spray into both nostrils 2 (two) times daily. for no more than 5 days Patient not taking: Reported on 01/08/2019 07/10/16   Tyrone NineGrunz, Ryan B, MD  Spacer/Aero-Holding Chambers (AEROCHAMBER PLUS) inhaler Use as instructed 08/05/17  Domenick Gong, MD    Family History Family History  Problem Relation Age of Onset  . Diabetes Mother   . Hypertension Mother   . Diabetes Father   . Asthma Other   . Hyperlipidemia Other   . Hypertension Other     Social History Social History   Tobacco Use  . Smoking status: Current Some Day Smoker    Packs/day: 0.33    Types: Cigarettes  . Smokeless tobacco: Never Used  Substance Use Topics  . Alcohol use: Yes    Comment: once a month at a party beer wine or mixed drinks  .  Drug use: No     Allergies   Patient has no known allergies.   Review of Systems Review of Systems All other systems reviewed and are negative except that which was mentioned in HPI   Physical Exam Updated Vital Signs BP 134/60 (BP Location: Right Arm)   Pulse 87   Temp 99.5 F (37.5 C) (Oral)   Resp 18   Ht 5\' 9"  (1.753 m)   Wt 109.8 kg   LMP 01/05/2019 (Exact Date)   SpO2 98%   BMI 35.74 kg/m   Physical Exam Vitals signs and nursing note reviewed.  Constitutional:      General: She is not in acute distress.    Appearance: She is well-developed.  HENT:     Head: Normocephalic and atraumatic.     Mouth/Throat:     Mouth: Mucous membranes are moist.     Pharynx: Pharyngeal swelling and posterior oropharyngeal erythema present.     Comments: Severe edema and erythema of b/l tonsils, unable to visualize uvula due to swelling, tolerating secretions Eyes:     Conjunctiva/sclera: Conjunctivae normal.     Pupils: Pupils are equal, round, and reactive to light.  Neck:     Musculoskeletal: Neck supple.  Cardiovascular:     Rate and Rhythm: Regular rhythm. Tachycardia present.     Heart sounds: Normal heart sounds. No murmur.  Pulmonary:     Effort: Pulmonary effort is normal.     Breath sounds: Normal breath sounds. No wheezing.  Abdominal:     General: Bowel sounds are normal. There is no distension.     Palpations: Abdomen is soft.     Tenderness: There is no abdominal tenderness.  Lymphadenopathy:     Cervical: Cervical adenopathy present.  Skin:    General: Skin is warm and dry.     Findings: No rash.  Neurological:     Mental Status: She is alert and oriented to person, place, and time.     Comments: Fluent speech  Psychiatric:        Judgment: Judgment normal.      ED Treatments / Results  Labs (all labs ordered are listed, but only abnormal results are displayed) Labs Reviewed  COMPREHENSIVE METABOLIC PANEL - Abnormal; Notable for the following  components:      Result Value   Sodium 134 (*)    Potassium 3.1 (*)    Glucose, Bld 115 (*)    BUN <5 (*)    Calcium 8.6 (*)    Total Protein 8.6 (*)    Albumin 2.1 (*)    All other components within normal limits  CBC WITH DIFFERENTIAL/PLATELET - Abnormal; Notable for the following components:   WBC 21.3 (*)    Hemoglobin 10.5 (*)    HCT 35.2 (*)    MCV 76.7 (*)    MCH 22.9 (*)  MCHC 29.8 (*)    RDW 16.4 (*)    Neutro Abs 18.4 (*)    Monocytes Absolute 1.2 (*)    Abs Immature Granulocytes 0.15 (*)    All other components within normal limits  GROUP A STREP BY PCR  MONONUCLEOSIS SCREEN  I-STAT BETA HCG BLOOD, ED ([facility], [facility], [facility] ONLY)    EKG None  Radiology Ct Soft Tissue Neck W Contrast  Result Date: 01/08/2019 CLINICAL DATA:  Sore throat and stridor EXAM: CT NECK WITH CONTRAST TECHNIQUE: Multidetector CT imaging of the neck was performed using the standard protocol following the bolus administration of intravenous contrast. CONTRAST:  100mL OMNIPAQUE IOHEXOL 300 MG/ML  SOLN COMPARISON:  None. FINDINGS: The examination is degraded by motion at the levels of the oropharynx and larynx. PHARYNX AND LARYNX: --Nasopharynx: The adenoid tonsils are markedly enlarged, effacing the fossa of Rosenmuller in filling the nasopharyngeal airway. --Oral cavity and oropharynx: Palatine tonsils are markedly enlarged, contributing to nasopharyngeal airway narrowing. The oropharyngeal airway remains patent. No peritonsillar abscess. --Hypopharynx: Normal vallecula and pyriform sinuses. --Larynx: Normal epiglottis and pre-epiglottic space. Normal aryepiglottic and vocal folds. --Retropharyngeal space: There is an asymmetric fluid collection in the right retropharyngeal space at the C3 level. No peripheral enhancement is visible, but there is significant streak artifact nearby. SALIVARY GLANDS: --Parotid: No mass lesion or inflammation. No sialolithiasis or ductal dilatation. --Submandibular: Symmetric  without inflammation. No sialolithiasis or ductal dilatation. --Sublingual: Limited visualization due to dental hardware streak artifact. THYROID: Diffuse thyroid enlargement. LYMPH NODES: Bilateral cervical lymphadenopathy with level 2A nodes measuring up to 10 mm on the right and left. VASCULAR: Major cervical vessels are patent. LIMITED INTRACRANIAL: Normal. VISUALIZED ORBITS: Normal. MASTOIDS AND VISUALIZED PARANASAL SINUSES: No fluid levels or advanced mucosal thickening. No mastoid effusion. SKELETON: No bony spinal canal stenosis. No lytic or blastic lesions. UPPER CHEST: Clear. OTHER: None. IMPRESSION: 1. Moderate motion artifact at the level of the oropharynx and larynx, limiting visualization. There is an asymmetric fluid collection within the right retropharyngeal space without a clear contrast-enhancing capsule, concerning for early retropharyngeal abscess formation. 2. Severely enlarged adenoid and palatine tonsils, consistent with acute tonsillopharyngitis. There is severe narrowing of the nasopharyngeal airway, but the oropharyngeal airway remains patent. No peritonsillar abscess. Normal epiglottis. 3. Reactive bilateral cervical lymphadenopathy. Electronically Signed   By: Deatra RobinsonKevin  Herman M.D.   On: 01/08/2019 18:26    Procedures Procedures (including critical care time)  Medications Ordered in ED Medications  lactated ringers bolus 1,000 mL (0 mLs Intravenous Stopped 01/08/19 1700)  dexamethasone (DECADRON) injection 10 mg (10 mg Intravenous Given 01/08/19 1559)  iohexol (OMNIPAQUE) 300 MG/ML solution 100 mL (100 mLs Intravenous Contrast Given 01/08/19 1802)  clindamycin (CLEOCIN) IVPB 600 mg (0 mg Intravenous Stopped 01/08/19 1955)  ketorolac (TORADOL) 30 MG/ML injection 30 mg (30 mg Intravenous Given 01/08/19 2054)  HYDROcodone-acetaminophen (HYCET) 7.5-325 mg/15 ml solution 10 mL (10 mLs Oral Given 01/08/19 2055)     Initial Impression / Assessment and Plan / ED Course  I have reviewed  the triage vital signs and the nursing notes.  Pertinent labs & imaging results that were available during my care of the patient were reviewed by me and considered in my medical decision making (see chart for details).    She was comfortable on exam, normal O2 saturation, no respiratory distress.  She did have a muffled voice and severe swelling of tonsils, unable to visualize uvula due to the degree of swelling of soft palate and tonsils.  She was tolerating secretions.  Gave Decadron and IV fluid bolus, lab work notable for mono and strep negative; normal creatinine; WBC 21.3. Obtained CT neck which shows severe enlargement of tonsils consistent with acute tonsillopharyngitis; patent oropharynx; some fluid within retropharyngeal space.  Based on findings, contacted ENT and discussed with Dr. Annalee Genta.  He states that fluid collection is likely reactive and does not represent RPA.  He agreed with Decadron and clindamycin and recommended clindamycin at home for 10 days.  He can see the patient in follow-up in the clinic.  On reassessment, she states that her symptoms have improved and she has been able to swallow liquids here.  I have discussed treatment plan and follow-up with her.  I have extensively reviewed return precautions including any worsening symptoms or sudden changes in symptoms.  She voiced understanding.  Final Clinical Impressions(s) / ED Diagnoses   Final diagnoses:  Tonsillopharyngitis    ED Discharge Orders         Ordered    clindamycin (CLEOCIN) 150 MG capsule  3 times daily     01/08/19 2135           , Ambrose Finland, MD 01/08/19 2139

## 2019-01-08 NOTE — ED Triage Notes (Signed)
Pt from home with complaint of Sore throat x 2 days, last night pt began having throat swelling. Tonsils appears very swollen. Airway still intact.

## 2019-01-08 NOTE — ED Notes (Signed)
Pt. Given ice chips.

## 2019-01-08 NOTE — ED Notes (Signed)
Reviewed discharge instructions with pt. Pt. Verbalized understanding. Pt. Refused wheelchair. Pt. In stable condition left ambulatory.

## 2019-01-08 NOTE — Discharge Instructions (Signed)
Take antibiotics three times daily as prescribed until finished. Take motrin 600mg  every 6-8 hours for pain. You may also take tylenol 1000mg  every 6 hours for pain. You can take children's liquid medication, just look on bottle to determine how much you will need to equal 600mg  motrin or 1000mg  tylenol. Drink plenty of fluids and soft diet until throat improves. Follow up in ENT clinic in about 2 weeks. RETURN TO ER IF ANY WORSENING SYMPTOMS

## 2019-01-14 ENCOUNTER — Other Ambulatory Visit: Payer: Self-pay

## 2019-01-14 ENCOUNTER — Ambulatory Visit (HOSPITAL_COMMUNITY)
Admission: EM | Admit: 2019-01-14 | Discharge: 2019-01-14 | Disposition: A | Discharge status: Home / Self Care | Payer: Medicare HMO

## 2019-01-14 ENCOUNTER — Encounter (HOSPITAL_COMMUNITY): Payer: Self-pay | Admitting: Emergency Medicine

## 2019-01-14 DIAGNOSIS — J309 Allergic rhinitis, unspecified: Secondary | ICD-10-CM | POA: Diagnosis not present

## 2019-01-14 MED ORDER — CETIRIZINE HCL 10 MG PO TABS: 10.0000 mg | ORAL_TABLET | Freq: Every day | ORAL | 0 refills | Status: DC

## 2019-01-14 MED ORDER — FLUTICASONE PROPIONATE 50 MCG/ACT NA SUSP: 1.0000 | Freq: Every day | NASAL | 2 refills | Status: DC

## 2019-01-14 NOTE — ED Provider Notes (Signed)
[facility]    CSN: [id number] Arrival date & time: 01/14/19  [id number]     History   Chief Complaint Chief Complaint  Patient presents with  . Sore Throat    HPI [name] is a 43 y.o. female.   Patient is a 43 year old female presents with nasal congestion, itchy watery eyes, ear fullness.  This is been present for the past couple of days.  She was seen in the ER on 01/08/2019.  She was then found to have very swollen tonsils and treated for tonsillitis with clindamycin and steroids.  Those symptoms have since improved and she is still taking the medicine.  She does have a history of seasonal allergies and asthma.  She has not been taking her allergy medication or Zyrtec while she has been taking the other medication as prescribed.  Denies any cough, congestion congestion, fevers, chills, myalgias.  She is not having any trouble swallowing, breathing.  She has been eating and drinking normally.  ROS per HPI      Past Medical History:  Diagnosis Date  . Asthma   . Hydradenitis   . Obesity     Patient Active Problem List   Diagnosis Date Noted  . Seasonal and perennial allergic rhinitis 03/25/2018  . 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 Perennial and seasonal allergic rhinitis 08/19/2017  . Moderate persistent asthma 08/19/2017    Past Surgical History:  Procedure Laterality Date  . CESAREAN SECTION     x2  . WISDOM TOOTH EXTRACTION  2011    OB History   No obstetric history on file.      Home Medications    Prior to Admission medications   Medication Sig Start Date End Date Taking? Authorizing Provider  albuterol (PROVENTIL HFA;VENTOLIN HFA) 108 (90 Base) MCG/ACT inhaler Inhale 2 puffs into the lungs every 4 (four) hours as needed for wheezing or shortness of breath. 03/11/18  Yes Alfonse SpruceGallagher, Joel Louis, MD  clindamycin (CLEOCIN) 150 MG capsule Take 2 capsules (300 mg total) by mouth 3 (three) times daily for 10 days.  01/08/19 01/18/19 Yes Little, Ambrose Finlandachel Morgan, MD  doxycycline (MONODOX) 100 MG capsule Take 100 mg by mouth 2 (two) times daily.   Yes [provider]  esomeprazole (NEXIUM) 40 MG capsule Take 40 mg by mouth daily. 07/26/17  Yes [provider]  cetirizine (ZYRTEC) 10 MG tablet Take 1 tablet (10 mg total) by mouth daily. 01/14/19   Dahlia ByesBast, Lorel Lembo A, NP  fluticasone (FLONASE) 50 MCG/ACT nasal spray Place 1 spray into both nostrils daily. 01/14/19   Dahlia ByesBast, Keiran Sias A, NP  ipratropium (ATROVENT) 0.06 % nasal spray Place 2 sprays into both nostrils 4 (four) times daily. 3-4 times/ day Patient not taking: Reported on 01/08/2019 08/05/17   Domenick GongMortenson, Ashley, MD  levocetirizine (XYZAL) 5 MG tablet Take 1 tablet (5 mg total) by mouth daily as needed for allergies. Patient not taking: Reported on 01/08/2019 03/11/18   Alfonse SpruceGallagher, Joel Louis, MD  oxymetazoline (AFRIN) 0.05 % nasal spray Place 1 spray into both nostrils 2 (two) times daily. for no more than 5 days Patient not taking: Reported on 01/08/2019 07/10/16   Tyrone NineGrunz, Ryan B, MD  Spacer/Aero-Holding Chambers (AEROCHAMBER PLUS) inhaler Use as instructed 08/05/17   Domenick GongMortenson, Ashley, MD  SYMBICORT 160-4.5 MCG/ACT inhaler TAKE 2 PUFFS BY MOUTH TWICE A DAY Patient taking differently: Inhale 2 puffs into the lungs 2 (two) times daily.  05/27/18   Alfonse SpruceGallagher, Joel Louis, MD  Family History Family History  Problem Relation Age of Onset  . Diabetes Mother   . Hypertension Mother   . Diabetes Father   . Asthma Other   . Hyperlipidemia Other   . Hypertension Other     Social History Social History   Tobacco Use  . Smoking status: Current Some Day Smoker    Packs/day: 0.33    Types: Cigarettes  . Smokeless tobacco: Never Used  Substance Use Topics  . Alcohol use: Yes    Comment: once a month at a party beer wine or mixed drinks  . Drug use: No     Allergies   Patient has no known allergies.   Review of Systems Review of  Systems   Physical Exam Triage Vital Signs ED Triage Vitals  Enc Vitals Group     BP 01/14/19 0959 113/70     Pulse Rate 01/14/19 0959 84     Resp 01/14/19 0959 16     Temp 01/14/19 0959 98.3 F (36.8 C)     Temp Source 01/14/19 0959 Oral     SpO2 01/14/19 0959 96 %     Weight --      Height --      Head Circumference --      Peak Flow --      Pain Score 01/14/19 1002 6     Pain Loc --      Pain Edu? --      Excl. in GC? --    No data found.  Updated Vital Signs BP 113/70   Pulse 84   Temp 98.3 F (36.8 C) (Oral)   Resp 16   LMP 01/05/2019 (Exact Date)   SpO2 96%   Visual Acuity Right Eye Distance:   Left Eye Distance:   Bilateral Distance:    Right Eye Near:   Left Eye Near:    Bilateral Near:     Physical Exam Constitutional:      General: She is not in acute distress.    Appearance: She is well-developed. She is not ill-appearing or toxic-appearing.  HENT:     Head: Normocephalic and atraumatic.     Right Ear: Tympanic membrane and ear canal normal.     Left Ear: Tympanic membrane and ear canal normal.     Nose: Congestion present.     Mouth/Throat:     Pharynx: Posterior oropharyngeal erythema present.     Tonsils: Swelling: 1+ on the right. 1+ on the left.  Neck:     Musculoskeletal: Normal range of motion.  Cardiovascular:     Rate and Rhythm: Normal rate and regular rhythm.  Pulmonary:     Effort: Pulmonary effort is normal.     Breath sounds: Normal breath sounds.  Skin:    General: Skin is warm and dry.  Neurological:     Mental Status: She is alert.  Psychiatric:        Mood and Affect: Mood normal.      UC Treatments / Results  Labs (all labs ordered are listed, but only abnormal results are displayed) Labs Reviewed - No data to display  EKG None  Radiology No results found.  Procedures Procedures (including critical care time)  Medications Ordered in UC Medications - No data to display  Initial Impression /  Assessment and Plan / UC Course  I have reviewed the triage vital signs and the nursing notes.  Pertinent labs & imaging results that were available during my care of the  patient were reviewed by me and considered in my medical decision making (see chart for details).     Symptoms most consistent with allergic rhinitis We will have her start back on the Flonase and Zyrtec daily Other symptoms have improved from previous illness and she is still currently taking medication for that Instructed to finish the medication as prescribed Follow up as needed for continued or worsening symptoms  Final Clinical Impressions(s) / UC Diagnoses   Final diagnoses:  Allergic rhinitis, unspecified seasonality, unspecified trigger     Discharge Instructions     Your throat appears to be better than previous Make sure you finish all of your antibiotics and steroids. I believe your other symptoms are allergy related. Start taking the Flonase and Zyrtec daily Follow up as needed for continued or worsening symptoms     ED Prescriptions    Medication Sig Dispense Auth. Provider   fluticasone (FLONASE) 50 MCG/ACT nasal spray Place 1 spray into both nostrils daily. 16 g Pinchos Topel A, NP   cetirizine (ZYRTEC) 10 MG tablet Take 1 tablet (10 mg total) by mouth daily. 30 tablet Dahlia Byes A, NP     Controlled Substance Prescriptions [place] Controlled Substance Registry consulted? Not Applicable   Janace Aris, NP 01/14/19 1102

## 2019-01-14 NOTE — ED Triage Notes (Signed)
PT reports sore throat last week. PT went to the ED for a sensation of throat closing last week. PT had a CT scan. PT was discharged with meds.  Sore throat is returning.

## 2019-01-14 NOTE — Discharge Instructions (Addendum)
Your throat appears to be better than previous Make sure you finish all of your antibiotics and steroids. I believe your other symptoms are allergy related. Start taking the Flonase and Zyrtec daily Follow up as needed for continued or worsening symptoms

## 2019-01-14 NOTE — ED Triage Notes (Signed)
PT reports she is still taking the meds prescribed from her ED visit.

## 2019-01-19 ENCOUNTER — Ambulatory Visit (INDEPENDENT_AMBULATORY_CARE_PROVIDER_SITE_OTHER): Payer: Medicare HMO | Admitting: Allergy & Immunology

## 2019-01-19 ENCOUNTER — Other Ambulatory Visit: Payer: Self-pay

## 2019-01-19 ENCOUNTER — Encounter: Payer: Self-pay | Admitting: Allergy & Immunology

## 2019-01-19 VITALS — BP 138/88 | HR 119 | Resp 16 | Ht 69.0 in | Wt 243.0 lb

## 2019-01-19 DIAGNOSIS — R69 Illness, unspecified: Secondary | ICD-10-CM | POA: Diagnosis not present

## 2019-01-19 DIAGNOSIS — Z9119 Patient's noncompliance with other medical treatment and regimen: Secondary | ICD-10-CM

## 2019-01-19 DIAGNOSIS — T7800XD Anaphylactic reaction due to unspecified food, subsequent encounter: Secondary | ICD-10-CM

## 2019-01-19 DIAGNOSIS — J4520 Mild intermittent asthma, uncomplicated: Secondary | ICD-10-CM | POA: Diagnosis not present

## 2019-01-19 DIAGNOSIS — J3089 Other allergic rhinitis: Secondary | ICD-10-CM

## 2019-01-19 DIAGNOSIS — J4540 Moderate persistent asthma, uncomplicated: Secondary | ICD-10-CM | POA: Diagnosis not present

## 2019-01-19 DIAGNOSIS — Z91199 Patient's noncompliance with other medical treatment and regimen due to unspecified reason: Secondary | ICD-10-CM

## 2019-01-19 DIAGNOSIS — F17200 Nicotine dependence, unspecified, uncomplicated: Secondary | ICD-10-CM

## 2019-01-19 MED ORDER — AZELASTINE HCL 0.1 % NA SOLN: 2.0000 | Freq: Two times a day (BID) | NASAL | 5 refills | Status: DC

## 2019-01-19 MED ORDER — LEVOCETIRIZINE DIHYDROCHLORIDE 5 MG PO TABS: 5.0000 mg | ORAL_TABLET | Freq: Every day | ORAL | 5 refills | Status: DC | PRN

## 2019-01-19 MED ORDER — EPINEPHRINE 0.3 MG/0.3ML IJ SOAJ: 0.3000 mg | Freq: Once | INTRAMUSCULAR | 2 refills | Status: AC

## 2019-01-19 MED ORDER — BUDESONIDE-FORMOTEROL FUMARATE 160-4.5 MCG/ACT IN AERO: INHALATION_SPRAY | RESPIRATORY_TRACT | 1 refills | Status: DC

## 2019-01-19 NOTE — Patient Instructions (Addendum)
1. Moderate persistent asthma, uncomplicated - Your lung testing looked much better today.  - Daily controller medication(s): Symbicort 160/4.6mcg two puffs twice daily with spacer  - Prior to physical activity: ProAir 2 puffs 10-15 minutes before physical activity. - Rescue medications: ProAir 4 puffs every 4-6 hours as needed or albuterol nebulizer one vial puffs every 4-6 hours as needed - Asthma control goals:  * Full participation in all desired activities (may need albuterol before activity) * Albuterol use two time or less a week on average (not counting use with activity) * Cough interfering with sleep two time or less a month * Oral steroids no more than once a year * No hospitalizations  2. Anaphylactic shock due to food (shellfish) - EpiPen refilled.    3. Perennial and seasonal allergic rhinitis (grasses, weeds, ragweed, trees, molds, cat, dog, cockroach, and dust mite) - Continue with fluticasone nasal spray two sprays per nostril daily. - Continue with azelastine nasal spray two sprays per nostril daily. - Continue with levocetirizine 5mg  tablet once daily.  - Continue with montelukast (Singulair) 10mg  daily.  - Make an appointment to restart shots.   4. Return in about 4 months (around 05/20/2019).    Please inform us of any Emergency Department visits, hospitalizations, or changes in symptoms. Call us before going to the ED for breathing or allergy symptoms since we might be able to fit you in for a sick visit. Feel free to contact us anytime with any questions, problems, or concerns.  It was a pleasure to see you and your family again today!  Websites that have reliable patient information: 1. American Academy of Asthma, Allergy, and Immunology: www.aaaai.org 2. Food Allergy Research and Education (FARE): foodallergy.org 3. Mothers of Asthmatics: http://www.asthmacommunitynetwork.org 4. American College of Allergy, Asthma, and Immunology: www.acaai.org

## 2019-01-19 NOTE — Progress Notes (Signed)
FOLLOW UP  Date of Service/Encounter:  01/19/19   Assessment:   Seasonal and perennial allergic rhinitis (grasses, weeds, ragweed, trees, molds, cat, dog, cockroach, and dust mite)  Moderate persistent asthma, uncomplicated  Anaphylactic shock due to food  Non-compliance   Asthma Reportables:  Severity: moderate persistent  Risk: high Control: well controlled    Plan/Recommendations:   1. Moderate persistent asthma, uncomplicated - Your lung testing looked much better today.  - Daily controller medication(s): Symbicort 160/4.23mcg two puffs twice daily with spacer  - Prior to physical activity: ProAir 2 puffs 10-15 minutes before physical activity. - Rescue medications: ProAir 4 puffs every 4-6 hours as needed or albuterol nebulizer one vial puffs every 4-6 hours as needed - Asthma control goals:  * Full participation in all desired activities (may need albuterol before activity) * Albuterol use two time or less a week on average (not counting use with activity) * Cough interfering with sleep two time or less a month * Oral steroids no more than once a year * No hospitalizations  2. Anaphylactic shock due to food (shellfish) - EpiPen refilled.    3. Perennial and seasonal allergic rhinitis (grasses, weeds, ragweed, trees, molds, cat, dog, cockroach, and dust mite) - Continue with fluticasone nasal spray two sprays per nostril daily. - Continue with azelastine nasal spray two sprays per nostril daily. - Continue with levocetirizine  tablet once daily.  - Continue with montelukast (Singulair)  daily.  - Make an appointment to restart shots.   4. Return in about 4 months (around 05/20/2019).   Subjective:   [name] is a 43 y.o. female presenting today for follow up of  Chief Complaint  Patient presents with  . Asthma    [name] has a history of the following: Patient Active Problem List   Diagnosis Date Noted  . Seasonal and  perennial allergic rhinitis 03/25/2018  . 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 Perennial and seasonal allergic rhinitis 08/19/2017  . Moderate persistent asthma 08/19/2017    History obtained from: chart review and patient.  [name] is a 43 y.o. female presenting for a follow up visit.  She was last seen in May 2019.  She has a history of noncompliance as well as multiple atopic problems including moderate persistent asthma, perennial and seasonal allergic rhinitis, and shellfish allergy.  Her lung testing looked better at the last visit.  We sent in her medications again since she reported that the pharmacy never received them.  We continued Symbicort 160/4.5 mcg 2 puffs twice daily with albuterol as needed.  She has a history of shellfish allergy and we recommended continued avoidance.  For her rhinitis we continued Flonase, Astelin, Xyzal, and Singulair.  We did restart her allergen immunotherapy at the last visit.  She was supposed to follow-up in 3 months in August 2019.  In the interim, she did present to the ER on February 20 with nasal congestion, itchy watery eyes, and ear fullness for 48 hours.  This was attributed to allergic rhinitis.  They started her back on Flonase and Zyrtec. She has been to the ED twice in the last month due to throat swelling. At this point, she is having some improvement in her symptoms. She remains on the clindamycin which was prescribed on February 14th. Apparently this was given due to concern for RPA (recommended by Dr. Annalee Genta). She has three more doses to take.   Asthma/Respiratory Symptom History: She is having  some problems at night. She reports that she is awakening and gasping for air when she is sleeping. She is on her Symbicort two puffs twice daily. She recently picked it up from the pharmacy. She tells me today that she has not required the use of any prednisone since the last visit at all. ACT score today is 13, indicating poor  asthma control.   Allergic Rhinitis Symptom History: She remains on her nose sprays at least according to the patient. Beside the clindamycin, she is also on doxycycline daily. She has a yeast infection from her antibiotic use. She remains interested in the allergen immunotherapy and knows that she needs to stick with it. Her life is such that she now has the time to stick it out. She has been through much special upheaval with her mother's passing.   Food Allergy Symptom History: She continues to avoid shellfish. Her EpiPen needs to be refilled. She has had no accidental exposures whatsoever.   Otherwise, there have been no changes to her past medical history, surgical history, family history, or social history.    Review of Systems  Constitutional: Negative.  Negative for fever, malaise/fatigue and weight loss.  HENT: Negative.  Negative for congestion, ear discharge and ear pain.   Eyes: Negative for pain, discharge and redness.  Respiratory: Negative for cough, sputum production, shortness of breath and wheezing.   Cardiovascular: Negative.  Negative for chest pain and palpitations.  Gastrointestinal: Negative for abdominal pain, heartburn and vomiting.  Skin: Negative.  Negative for itching and rash.  Neurological: Negative for dizziness and headaches.  Endo/Heme/Allergies: Positive for environmental allergies. Does not bruise/bleed easily.       Positive for weight loss.       Objective:   Blood pressure 138/88, pulse (!) 119, resp. rate 16, height 5\' 9"  (1.753 m), weight 243 lb (110.2 kg), last menstrual period 01/05/2019, SpO2 95 %. Body mass index is 35.88 kg/m.   Physical Exam:  Physical Exam  Constitutional: She appears well-developed.  She has lost a significant amount of weight since the last time that I saw her.  HENT:  Head: Normocephalic and atraumatic.  Right Ear: Tympanic membrane, external ear and ear canal normal.  Left Ear: Tympanic membrane and ear canal  normal.  Nose: No mucosal edema, rhinorrhea, nasal deformity or septal deviation. No epistaxis. Right sinus exhibits no maxillary sinus tenderness and no frontal sinus tenderness. Left sinus exhibits no maxillary sinus tenderness and no frontal sinus tenderness.  Mouth/Throat: Uvula is midline and oropharynx is clear and moist. Mucous membranes are not pale and not dry.  Eyes: Pupils are equal, round, and reactive to light. Conjunctivae and EOM are normal. Right eye exhibits no chemosis and no discharge. Left eye exhibits no chemosis and no discharge. Right conjunctiva is not injected. Left conjunctiva is not injected.  Cardiovascular: Normal rate, regular rhythm and normal heart sounds.  Respiratory: Effort normal. No accessory muscle usage. No tachypnea. No respiratory distress. She has no wheezes. She has no rhonchi. She has no rales. She exhibits no tenderness.  Decreased air movement at the bases bilaterally.  Lymphadenopathy:    She has no cervical adenopathy.  Neurological: She is alert.  Skin: No abrasion, no petechiae and no rash noted. Rash is not papular, not vesicular and not urticarial. No erythema. No pallor.  Psychiatric: She has a normal mood and affect.     Diagnostic studies:    Spirometry: results abnormal (FEV1: 2.14/73%, FVC: 3.19/88%, FEV1/FVC: 67%).  Spirometry consistent with mild obstructive disease.   Allergy Studies: none           Malachi Bonds, MD  [facility]

## 2019-01-25 DIAGNOSIS — J3089 Other allergic rhinitis: Secondary | ICD-10-CM | POA: Diagnosis not present

## 2019-01-25 NOTE — Progress Notes (Signed)
VIALS EXP 01-25-2020

## 2019-01-26 DIAGNOSIS — J3081 Allergic rhinitis due to animal (cat) (dog) hair and dander: Secondary | ICD-10-CM | POA: Diagnosis not present

## 2019-02-09 ENCOUNTER — Ambulatory Visit: Payer: Medicare HMO

## 2019-02-17 DIAGNOSIS — J4520 Mild intermittent asthma, uncomplicated: Secondary | ICD-10-CM | POA: Diagnosis not present

## 2019-02-22 DIAGNOSIS — G4700 Insomnia, unspecified: Secondary | ICD-10-CM | POA: Diagnosis not present

## 2019-02-22 DIAGNOSIS — Z0000 Encounter for general adult medical examination without abnormal findings: Secondary | ICD-10-CM | POA: Diagnosis not present

## 2019-02-22 DIAGNOSIS — Z716 Tobacco abuse counseling: Secondary | ICD-10-CM | POA: Diagnosis not present

## 2019-02-22 DIAGNOSIS — J4520 Mild intermittent asthma, uncomplicated: Secondary | ICD-10-CM | POA: Diagnosis not present

## 2019-02-22 DIAGNOSIS — L732 Hidradenitis suppurativa: Secondary | ICD-10-CM | POA: Diagnosis not present

## 2019-02-22 DIAGNOSIS — Z6834 Body mass index (BMI) 34.0-34.9, adult: Secondary | ICD-10-CM | POA: Diagnosis not present

## 2019-02-22 DIAGNOSIS — R69 Illness, unspecified: Secondary | ICD-10-CM | POA: Diagnosis not present

## 2019-02-22 DIAGNOSIS — R7303 Prediabetes: Secondary | ICD-10-CM | POA: Diagnosis not present

## 2019-02-23 ENCOUNTER — Ambulatory Visit (INDEPENDENT_AMBULATORY_CARE_PROVIDER_SITE_OTHER): Payer: Medicare HMO | Admitting: *Deleted

## 2019-02-23 ENCOUNTER — Ambulatory Visit: Payer: Self-pay | Admitting: *Deleted

## 2019-02-23 ENCOUNTER — Other Ambulatory Visit: Payer: Self-pay

## 2019-02-23 DIAGNOSIS — J309 Allergic rhinitis, unspecified: Secondary | ICD-10-CM | POA: Diagnosis not present

## 2019-02-23 NOTE — Progress Notes (Signed)
Immunotherapy   Patient Details  Name: DAILY PROVINS MRN: [id number] Date of Birth: 30-Jan-1976  02/23/2019  [name] started injections for  G-W-T-DM, M-C-D-CR Following schedule: A  Frequency: 1-2 times weekly  Epi-Pen: Yes Consent signed and patient instructions given. Pt started allergy injections today. Pt received .6mL of G-W-T-DM in RUA and .38mL of M-C-D-CR in LUA. Pt waited 30 minutes in room 1 and did not experience any issues.    Ashleigh Fernandez-Vernon 02/23/2019, 4:17 PM

## 2019-03-08 ENCOUNTER — Ambulatory Visit (INDEPENDENT_AMBULATORY_CARE_PROVIDER_SITE_OTHER): Payer: Medicare HMO

## 2019-03-08 DIAGNOSIS — J309 Allergic rhinitis, unspecified: Secondary | ICD-10-CM | POA: Diagnosis not present

## 2019-03-08 DIAGNOSIS — R946 Abnormal results of thyroid function studies: Secondary | ICD-10-CM | POA: Diagnosis not present

## 2019-03-16 ENCOUNTER — Ambulatory Visit (INDEPENDENT_AMBULATORY_CARE_PROVIDER_SITE_OTHER): Payer: Medicare HMO | Admitting: *Deleted

## 2019-03-16 DIAGNOSIS — J309 Allergic rhinitis, unspecified: Secondary | ICD-10-CM | POA: Diagnosis not present

## 2019-03-20 DIAGNOSIS — J4520 Mild intermittent asthma, uncomplicated: Secondary | ICD-10-CM | POA: Diagnosis not present

## 2019-03-23 ENCOUNTER — Ambulatory Visit (INDEPENDENT_AMBULATORY_CARE_PROVIDER_SITE_OTHER): Payer: Medicare HMO | Admitting: *Deleted

## 2019-03-23 DIAGNOSIS — J309 Allergic rhinitis, unspecified: Secondary | ICD-10-CM

## 2019-04-19 DIAGNOSIS — J4520 Mild intermittent asthma, uncomplicated: Secondary | ICD-10-CM | POA: Diagnosis not present

## 2019-05-03 ENCOUNTER — Other Ambulatory Visit: Payer: Self-pay | Admitting: *Deleted

## 2019-05-03 MED ORDER — BUDESONIDE-FORMOTEROL FUMARATE 160-4.5 MCG/ACT IN AERO: INHALATION_SPRAY | RESPIRATORY_TRACT | 0 refills | Status: DC

## 2019-05-10 ENCOUNTER — Ambulatory Visit: Payer: Medicare HMO | Admitting: Internal Medicine

## 2019-05-24 ENCOUNTER — Other Ambulatory Visit: Payer: Self-pay

## 2019-05-24 ENCOUNTER — Encounter: Payer: Self-pay | Admitting: Internal Medicine

## 2019-05-24 NOTE — Progress Notes (Signed)
Name: [name]  MRN/ DOB: [id number], July 25, 1976    Age/ Sex: 43 y.o., female    PCP: Nolene Ebbs, MD   Reason for Endocrinology Evaluation: Hyperthyroidism     Date of Initial Endocrinology Evaluation: 05/25/2019     HPI: [name] is a 43 y.o. female with a past medical history of Asthma, Pre-diabetes and GERD The patient presented for initial endocrinology clinic visit on 05/25/2019 for consultative assistance with her hyperthyroidism   Pt was noted with hyperthyroidism on routine lab work, she was noted to have weight loss at the time and insomnia.    Pt recalls being told she had thyroid issues as a child.    Today she continues to have weight loss, denies heat intolerance and palpitations, has minimal diarrhea.   She has chronic anxiety, and jittery sensation  Denies tremors  No local neck symptoms.   Denies eye symptoms.   LMP 05/02/2019- regular   Daughter with thyroid disease  HISTORY:  Past Medical History:  Past Medical History:  Diagnosis Date   Asthma    Hydradenitis    Obesity    Past Surgical History:  Past Surgical History:  Procedure Laterality Date   CESAREAN SECTION     x2   TUBAL LIGATION     WISDOM TOOTH EXTRACTION  2011      Social History:  reports that she has been smoking cigarettes. She has been smoking about 0.33 packs per day. She has never used smokeless tobacco. She reports current alcohol use. She reports that she does not use drugs.  Family History: family history includes Asthma in an other family member; Diabetes in her father and mother; Hyperlipidemia in an other family member; Hypertension in her mother and another family member.   HOME MEDICATIONS: Allergies as of 05/25/2019      Reactions   Shellfish Allergy       Medication List       Accurate as of May 25, 2019  8:16 AM. If you have any questions, ask your nurse or doctor.        AeroChamber Plus inhaler Use as instructed     albuterol 108 (90 Base) MCG/ACT inhaler Commonly known as: VENTOLIN HFA Inhale 2 puffs into the lungs every 4 (four) hours as needed for wheezing or shortness of breath.   azelastine 0.1 % nasal spray Commonly known as: ASTELIN Place 2 sprays into both nostrils 2 (two) times daily.   budesonide-formoterol 160-4.5 MCG/ACT inhaler Commonly known as: Symbicort TAKE 2 PUFFS BY MOUTH TWICE A DAY   cetirizine 10 MG tablet Commonly known as: ZYRTEC Take 1 tablet (10 mg total) by mouth daily.   doxycycline 100 MG capsule Commonly known as: MONODOX Take 100 mg by mouth 2 (two) times daily.   esomeprazole 40 MG capsule Commonly known as: NEXIUM Take 40 mg by mouth daily.   fluticasone 50 MCG/ACT nasal spray Commonly known as: FLONASE Place 1 spray into both nostrils daily.   ipratropium 0.06 % nasal spray Commonly known as: Atrovent Place 2 sprays into both nostrils 4 (four) times daily. 3-4 times/ day   levocetirizine 5 MG tablet Commonly known as: XYZAL Take 1 tablet (5 mg total) by mouth daily as needed for allergies.   oxymetazoline 0.05 % nasal spray Commonly known as: AFRIN Place 1 spray into both nostrils 2 (two) times daily. for no more than 5 days         REVIEW OF SYSTEMS: A  comprehensive ROS was conducted with the patient and is negative except as per HPI and below:  Review of Systems  Constitutional: Negative for chills and fever.  HENT: Negative for congestion and sore throat.   Eyes: Negative for blurred vision and pain.  Respiratory: Negative for cough and shortness of breath.   Cardiovascular: Negative for chest pain and palpitations.  Gastrointestinal: Positive for diarrhea. Negative for nausea.  Genitourinary: Positive for dysuria. Negative for frequency.  Skin: Negative.   Neurological: Negative for tingling and tremors.  Endo/Heme/Allergies: Negative for polydipsia.  Psychiatric/Behavioral: Negative for depression. The patient is nervous/anxious.         OBJECTIVE:  VS: BP 122/64 (BP Location: Left Arm, Patient Position: Sitting, Cuff Size: Large)    Pulse 96    Temp 97.9 F (36.6 C)    Ht 5' 9"  (1.753 m)    Wt 245 lb 9.6 oz (111.4 kg)    SpO2 98%    BMI 36.27 kg/m    Wt Readings from Last 3 Encounters:  05/25/19 245 lb 9.6 oz (111.4 kg)  01/19/19 243 lb (110.2 kg)  01/08/19 242 lb (109.8 kg)     EXAM: General: Pt appears well and is in NAD  Hydration: Well-hydrated with moist mucous membranes and good skin turgor  Eyes: External eye exam normal without stare, lid lag or exophthalmos.  EOM intact.  PERRL.  Ears, Nose, Throat: Hearing: Grossly intact bilaterally Dental: Good dentition  Throat: Clear without mass, erythema or exudate  Neck: General: Supple without adenopathy. Thyroid: Thyroid size l enlarged ~ 80 grams.   No nodules appreciated.+  thyroid bruit.  Lungs: Clear with good BS bilat with no rales, rhonchi, or wheezes  Heart: Auscultation: RRR.  Abdomen: Normoactive bowel sounds, soft, nontender, without masses or organomegaly palpable  Extremities:  BL LE: Trace pretibial edema normal ROM and strength.  Skin: Hair: Texture and amount normal with gender appropriate distribution Skin Inspection: No rashes. Skin Palpation: Skin temperature, texture, and thickness normal to palpation  Neuro: Cranial nerves: II - XII grossly intact   Motor: Normal strength throughout DTRs: 2+ and symmetric in UE without delay in relaxation phase  Mental Status: Judgment, insight: Intact Orientation: Oriented to time, place, and person Mood and affect: No depression, anxiety, or agitation     DATA REVIEWED: 03/08/2019  TSH < 0.01 uIU/mL  FT4 2.8 ng/dL (0.8-1.8) Total T3 241 ng/dL (76-181) Anti-TPO Ab > 900 IU/mL   Alk Phos 121 U/L   (31-125) AST   15 U/L (10-30)  ALT    11 U/L     ASSESSMENT/PLAN/RECOMMENDATIONS:   1. Hyperthyroidism , Most likely Secondary to Graves' Disease:  - Clinically she has some  hyperthyroid symptoms  - Discussed D/D of graves' disease vs toxic nodule (s) vs thyroiditis.  - We discussed that Graves' Disease is a result of an autoimmune condition involving the thyroid.    We discussed with pt the benefits of methimazole in the Tx of hyperthyroidism, as well as the possible side effects/complications of anti-thyroid drug Tx (specifically detailing the rare, but serious side effect of agranulocytosis). She was informed of need for regular thyroid function monitoring while on methimazole to ensure appropriate dosage without over-treatment. As well, we discussed the possible side effects of methimazole including the chance of rash, the small chance of liver irritation/juandice and the <=1 in 300-400 chance of sudden onset agranulocytosis.  We discussed importance of going to ED promptly (and stopping methimazole) if shewere to develop significant  fever with severe sore throat of other evidence of acute infection.       We extensively discussed the various treatment options for hyperthyroidism and Graves disease including ablation therapy with radioactive iodine versus antithyroid drug treatment versus surgical therapy.  We recommended to the patient that we felt, at this time, that thionamide therapy would be most optimal.  We discussed the various possible benefits versus side effects of the various therapies.     Medications : Methimazole 20 mg daily     Labs in 6 weeks  F/u in 3 months   Signed electronically by: Mack Guise, MD  [facility]  [facility] [address] [place], [place] [address] Phone: [phone] FAX: [phone]   CC: Nolene Ebbs, [address] [place] [address] Phone: [phone] Fax: [phone]   Return to Endocrinology clinic as below: Future Appointments  Date Time Provider [place]  05/25/2019 10:45 AM Valentina Shaggy, MD [facility] None

## 2019-05-25 ENCOUNTER — Ambulatory Visit (INDEPENDENT_AMBULATORY_CARE_PROVIDER_SITE_OTHER): Payer: Medicare Other | Admitting: Internal Medicine

## 2019-05-25 ENCOUNTER — Ambulatory Visit (INDEPENDENT_AMBULATORY_CARE_PROVIDER_SITE_OTHER): Payer: Medicare Other | Admitting: Allergy & Immunology

## 2019-05-25 ENCOUNTER — Encounter: Payer: Self-pay | Admitting: Internal Medicine

## 2019-05-25 ENCOUNTER — Encounter: Payer: Self-pay | Admitting: Allergy & Immunology

## 2019-05-25 VITALS — BP 122/64 | HR 96 | Temp 97.9°F | Ht 69.0 in | Wt 245.6 lb

## 2019-05-25 DIAGNOSIS — T7800XD Anaphylactic reaction due to unspecified food, subsequent encounter: Secondary | ICD-10-CM | POA: Diagnosis not present

## 2019-05-25 DIAGNOSIS — J3089 Other allergic rhinitis: Secondary | ICD-10-CM | POA: Diagnosis not present

## 2019-05-25 DIAGNOSIS — E0590 Thyrotoxicosis, unspecified without thyrotoxic crisis or storm: Secondary | ICD-10-CM | POA: Diagnosis not present

## 2019-05-25 DIAGNOSIS — F17200 Nicotine dependence, unspecified, uncomplicated: Secondary | ICD-10-CM

## 2019-05-25 DIAGNOSIS — J4540 Moderate persistent asthma, uncomplicated: Secondary | ICD-10-CM

## 2019-05-25 LAB — CBC WITH DIFFERENTIAL/PLATELET
Basophils Absolute: 0.1 10*3/uL (ref 0.0–0.1)
Basophils Relative: 0.7 % (ref 0.0–3.0)
Eosinophils Absolute: 0 10*3/uL (ref 0.0–0.7)
Eosinophils Relative: 0.3 % (ref 0.0–5.0)
HCT: 32.6 % — ABNORMAL LOW (ref 36.0–46.0)
Hemoglobin: 9.8 g/dL — ABNORMAL LOW (ref 12.0–15.0)
Lymphocytes Relative: 21.5 % (ref 12.0–46.0)
Lymphs Abs: 2.2 10*3/uL (ref 0.7–4.0)
MCHC: 30.1 g/dL (ref 30.0–36.0)
MCV: 73 fl — ABNORMAL LOW (ref 78.0–100.0)
Monocytes Absolute: 0.7 10*3/uL (ref 0.1–1.0)
Monocytes Relative: 6.5 % (ref 3.0–12.0)
Neutro Abs: 7.4 10*3/uL (ref 1.4–7.7)
Neutrophils Relative %: 71 % (ref 43.0–77.0)
Platelets: 224 10*3/uL (ref 150.0–400.0)
RBC: 4.47 Mil/uL (ref 3.87–5.11)
RDW: 17.5 % — ABNORMAL HIGH (ref 11.5–15.5)
WBC: 10.4 10*3/uL (ref 4.0–10.5)

## 2019-05-25 LAB — COMPREHENSIVE METABOLIC PANEL
ALT: 7 U/L (ref 0–35)
AST: 11 U/L (ref 0–37)
Albumin: 2.9 g/dL — ABNORMAL LOW (ref 3.5–5.2)
Alkaline Phosphatase: 108 U/L (ref 39–117)
BUN: 9 mg/dL (ref 6–23)
CO2: 23 mEq/L (ref 19–32)
Calcium: 8.4 mg/dL (ref 8.4–10.5)
Chloride: 106 mEq/L (ref 96–112)
Creatinine, Ser: 0.46 mg/dL (ref 0.40–1.20)
GFR: 179.78 mL/min (ref >60.00)
Glucose, Bld: 124 mg/dL — ABNORMAL HIGH (ref 70–99)
Potassium: 3.4 mEq/L — ABNORMAL LOW (ref 3.5–5.1)
Sodium: 135 mEq/L (ref 135–145)
Total Bilirubin: 0.2 mg/dL (ref 0.2–1.2)
Total Protein: 8.1 g/dL (ref 6.0–8.3)

## 2019-05-25 LAB — TSH: TSH: <0.01 u[IU]/mL — ABNORMAL LOW (ref 0.35–4.50)

## 2019-05-25 LAB — T4, FREE: Free T4: 2.55 ng/dL — ABNORMAL HIGH (ref 0.60–1.60)

## 2019-05-25 MED ORDER — IPRATROPIUM BROMIDE 0.06 % NA SOLN: 2.0000 | Freq: Four times a day (QID) | NASAL | 0 refills | Status: DC

## 2019-05-25 MED ORDER — METHIMAZOLE 10 MG PO TABS: 20.0000 mg | ORAL_TABLET | Freq: Every day | ORAL | 6 refills | Status: DC

## 2019-05-25 MED ORDER — FLUTICASONE PROPIONATE 50 MCG/ACT NA SUSP: 1.0000 | Freq: Every day | NASAL | 2 refills | Status: DC

## 2019-05-25 MED ORDER — LEVOCETIRIZINE DIHYDROCHLORIDE 5 MG PO TABS: 5.0000 mg | ORAL_TABLET | Freq: Every day | ORAL | 5 refills | Status: DC | PRN

## 2019-05-25 MED ORDER — BUDESONIDE-FORMOTEROL FUMARATE 160-4.5 MCG/ACT IN AERO: INHALATION_SPRAY | RESPIRATORY_TRACT | 0 refills | Status: DC

## 2019-05-25 MED ORDER — ALBUTEROL SULFATE HFA 108 (90 BASE) MCG/ACT IN AERS: 2.0000 | INHALATION_SPRAY | RESPIRATORY_TRACT | 1 refills | Status: DC | PRN

## 2019-05-25 MED ORDER — AZELASTINE HCL 0.1 % NA SOLN: 2.0000 | Freq: Two times a day (BID) | NASAL | 5 refills | Status: DC

## 2019-05-25 MED ORDER — OXYMETAZOLINE HCL 0.05 % NA SOLN: 1.0000 | Freq: Two times a day (BID) | NASAL | 0 refills | Status: DC

## 2019-05-25 NOTE — Patient Instructions (Addendum)
1. Moderate persistent asthma, uncomplicated - We did do lung testing today.  - Daily controller medication(s): Symbicort 160/4.73mcg two puffs twice daily with spacer  - Prior to physical activity: ProAir 2 puffs 10-15 minutes before physical activity. - Rescue medications: ProAir 4 puffs every 4-6 hours as needed or albuterol nebulizer one vial puffs every 4-6 hours as needed - Asthma control goals:  * Full participation in all desired activities (may need albuterol before activity) * Albuterol use two time or less a week on average (not counting use with activity) * Cough interfering with sleep two time or less a month * Oral steroids no more than once a year * No hospitalizations  2. Anaphylactic shock due to food (shellfish) - EpiPen refilled.    3. Perennial and seasonal allergic rhinitis (grasses, weeds, ragweed, trees, molds, cat, dog, cockroach, and dust mite) - Continue with fluticasone nasal spray two sprays per nostril daily. - Continue with azelastine nasal spray two sprays per nostril daily. - Continue with levocetirizine 5mg  tablet once daily.  - Continue with montelukast (Singulair) 10mg  daily.  - Make an appointment to restart shots.   4. Return in about 2 months (around 07/25/2019). This can be an in-person, a virtual Webex or a telephone follow up visit.   Please inform us of any Emergency Department visits, hospitalizations, or changes in symptoms. Call us before going to the ED for breathing or allergy symptoms since we might be able to fit you in for a sick visit. Feel free to contact us anytime with any questions, problems, or concerns.  It was a pleasure to talk to you today today!  Websites that have reliable patient information: 1. American Academy of Asthma, Allergy, and Immunology: www.aaaai.org 2. Food Allergy Research and Education (FARE): foodallergy.org 3. Mothers of Asthmatics: http://www.asthmacommunitynetwork.org 4. American College of Allergy,  Asthma, and Immunology: www.acaai.org  "Like" [place] on Facebook and Instagram for our latest updates!      Make sure you are registered to vote! If you have moved or changed any of your contact information, you will need to get this updated before voting!  In some cases, you MAY be able to register to vote online: [email or web address]    Voter ID laws are NOT going into effect for the General Election in November 2020! DO NOT let this stop you from exercising your right to vote!   Absentee voting is the SAFEST way to vote during the coronavirus pandemic!   Download and print an absentee ballot request form at [email or web address] or you can scan the QR code below with your smart phone:       More information on absentee ballots can be found here: [email or web address]

## 2019-05-25 NOTE — Patient Instructions (Signed)
-   Please stop by the lab today and again in 6 weeks   We recommend that you follow these hyperthyroidism instructions at home:  1) Take Methimazole   If you develop severe sore throat with high fevers OR develop unexplained yellowing of your skin, eyes, under your tongue, severe abdominal pain with nausea or vomiting --> then please get evaluated immediately.   2) Get repeat thyroid labs in 6 weeks    It is ESSENTIAL to get follow-up labs to help avoid over or undertreatment of your hyperthyroidism - both of which can be dangerous to your health.

## 2019-05-25 NOTE — Progress Notes (Signed)
RE: [name] MRN: [id number] DOB: 12/22/1975 Date of Telemedicine Visit: 05/25/2019  Referring provider: Fleet ContrasAvbuere, Edwin, MD Primary care provider: Fleet ContrasAvbuere, Edwin, MD  Chief Complaint: Asthma (coughing when hot, coughing waking her up out of her sleep), Allergic Rhinitis  (eye itching), and Food Intolerance (no accidental exposures)   Telemedicine Follow Up Visit via Telephone: I connected with [name] for a follow up on 05/26/19 by telephone and verified that I am speaking with the correct person using two identifiers.   I discussed the limitations, risks, security and privacy concerns of performing an evaluation and management service by telephone and the availability of in person appointments. I also discussed with the patient that there may be a patient responsible charge related to this service. The patient expressed understanding and agreed to proceed.   Patient is at home accompanied by her daughter who provided/contributed to the history.  Provider is at the office.  Visit start time: 3:36 PM Visit end time: 4:00 PM Insurance consent/check in by: Hilda LiasMarie Medical consent and medical assistant/nurse: Darreld Mcleanrina  History of Present Illness:  She is a 43 y.o. female, who is being followed for persistent asthma as well as allergic rhinitis and food allergy. Her previous allergy office visit was in February 2020 with Dr. Dellis AnesGallagher.  At that time her lung testing looked much better.  We continued her on Symbicort 160/4.5 mcg 2 puffs twice daily with spacer.  She also has albuterol to use as needed.  For her allergic rhinitis, we continued Flonase, Astelin, Xyzal, and Singulair.  She also was interested in starting allergy shots so she made an appointment to restart those.  She was recently placed on thyroid medication because her thyroid was working "on overdrive". She is being placed on methimazole for one year. She has been having these issues since she was a young girl and  this explains why she has been losing weight.   Asthma/Respiratory Symptom History: She remains on her Symbicort two puffs twice daily.  ACT is 14 indicating poor asthma control. She reports compliance with everything although she tells me that she has bene using her Symbicort more often after running out of her rescue inhaler. Her last rescue inhaler refill was at our last visit. She endorse problems with coughing/choking. ACT is 14 today, indicating poor asthma control. This is likely reflective of the fact that she has not been taking her Symbicort since she needs a refill.   Allergic Rhinitis Symptom History: She has since stopped her allergy shots. She was scared to leave the house. She is afraid that she is going to catch. She is having some increased eye itching and irritation. Now she is willing to go ahead and restart the allergy shots.  She does need refills on all of her allergy medications.  Food Allergy Symptom History: She continues to avoid shellfish. She does have some itching when she has shellfish cooked in the house.  She was not aware that shellfish proteins are aerosolized.  She really wants to eat some shellfish, but has been avoiding it due to her allergy.  Otherwise, there have been no changes to her past medical history, surgical history, family history, or social history.  She clearly loves her grandson [place], who is now around 1 year of age.  Assessment and Plan:  [name] is a 43 y.o. female with:  Seasonal and perennial allergic rhinitis(grasses, weeds, ragweed, trees, molds, cat, dog, cockroach, and dust mite)  Moderate persistent asthma,uncomplicated  Anaphylactic shock due  to food  Non-compliance   Asthma Reportables: Severity:moderate persistent Risk:high Control:well controlled    1. Moderate persistent asthma, uncomplicated - We did do lung testing today.  - Daily controller medication(s): Symbicort 160/4.745mcg two puffs twice daily with  spacer  - Prior to physical activity: ProAir 2 puffs 10-15 minutes before physical activity. - Rescue medications: ProAir 4 puffs every 4-6 hours as needed or albuterol nebulizer one vial puffs every 4-6 hours as needed - Asthma control goals:  * Full participation in all desired activities (may need albuterol before activity) * Albuterol use two time or less a week on average (not counting use with activity) * Cough interfering with sleep two time or less a month * Oral steroids no more than once a year * No hospitalizations  2. Anaphylactic shock due to food (shellfish) - EpiPen refilled.    3. Perennial and seasonal allergic rhinitis (grasses, weeds, ragweed, trees, molds, cat, dog, cockroach, and dust mite) - Continue with fluticasone nasal spray two sprays per nostril daily. - Continue with azelastine nasal spray two sprays per nostril daily. - Continue with levocetirizine 5mg  tablet once daily.  - Continue with montelukast (Singulair) 10mg  daily.  - Make an appointment to restart shots.   4. Return in about 2 months (around 07/25/2019). This can be an in-person, a virtual Webex or a telephone follow up visit.   Diagnostics: None.  Medication List:  Current Outpatient Medications  Medication Sig Dispense Refill  . albuterol (VENTOLIN HFA) 108 (90 Base) MCG/ACT inhaler Inhale 2 puffs into the lungs every 4 (four) hours as needed for wheezing or shortness of breath. 18 g 1  . azelastine (ASTELIN) 0.1 % nasal spray Place 2 sprays into both nostrils 2 (two) times daily. 30 mL 5  . budesonide-formoterol (SYMBICORT) 160-4.5 MCG/ACT inhaler TAKE 2 PUFFS BY MOUTH TWICE A DAY 30.6 Inhaler 0  . doxycycline (MONODOX) 100 MG capsule Take 100 mg by mouth 2 (two) times daily.    Marland Kitchen. esomeprazole (NEXIUM) 40 MG capsule Take 40 mg by mouth daily.  5  . fluticasone (FLONASE) 50 MCG/ACT nasal spray Place 1 spray into both nostrils daily. 16 g 2  . ipratropium (ATROVENT) 0.06 % nasal spray Place 2  sprays into both nostrils 4 (four) times daily. 3-4 times/ day 15 mL 0  . levocetirizine (XYZAL) 5 MG tablet Take 1 tablet (5 mg total) by mouth daily as needed for allergies. 30 tablet 5  . methimazole (TAPAZOLE) 10 MG tablet Take 2 tablets (20 mg total) by mouth daily. 60 tablet 6  . Spacer/Aero-Holding Chambers (AEROCHAMBER PLUS) inhaler Use as instructed 1 each 2   No current facility-administered medications for this visit.    Allergies: Allergies  Allergen Reactions  . Shellfish Allergy    I reviewed her past medical history, social history, family history, and environmental history and no significant changes have been reported from previous visits.  Review of Systems  Constitutional: Positive for unexpected weight change. Negative for activity change and appetite change.  HENT: Negative for congestion, postnasal drip, rhinorrhea, sinus pressure and sore throat.   Eyes: Negative for pain, discharge, redness and itching.  Respiratory: Positive for cough and shortness of breath. Negative for apnea, choking, wheezing and stridor.   Gastrointestinal: Positive for constipation. Negative for diarrhea, nausea and vomiting.  Endocrine: Positive for heat intolerance. Negative for cold intolerance.  Musculoskeletal: Negative for arthralgias, joint swelling and myalgias.  Skin: Negative for rash.  Allergic/Immunologic: Negative for environmental allergies and food  allergies.    Objective:  Physical exam not obtained as encounter was done via telephone.   Previous notes and tests were reviewed.  I discussed the assessment and treatment plan with the patient. The patient was provided an opportunity to ask questions and all were answered. The patient agreed with the plan and demonstrated an understanding of the instructions.   The patient was advised to call back or seek an in-person evaluation if the symptoms worsen or if the condition fails to improve as anticipated.  I provided 24  minutes of non-face-to-face time during this encounter.  It was my pleasure to participate in [facility] care today. Please feel free to contact me with any questions or concerns.   Sincerely,  Valentina Shaggy, MD

## 2019-05-26 ENCOUNTER — Encounter: Payer: Self-pay | Admitting: Allergy & Immunology

## 2019-05-26 DIAGNOSIS — F17200 Nicotine dependence, unspecified, uncomplicated: Secondary | ICD-10-CM | POA: Insufficient documentation

## 2019-05-28 LAB — TRAB (TSH RECEPTOR BINDING ANTIBODY): TRAB: 2.71 IU/L — ABNORMAL HIGH

## 2019-06-18 ENCOUNTER — Other Ambulatory Visit: Payer: Self-pay | Admitting: Allergy & Immunology

## 2019-07-01 ENCOUNTER — Telehealth: Payer: Self-pay | Admitting: *Deleted

## 2019-07-01 NOTE — Telephone Encounter (Signed)
Patient came in to restart allergy injections today. Was unable to locate vials and patient was unable to wait. Patient advised to call her daughter [name] phone number to inform that we have located her vials. Her vials were found in the new start tray after she left. Called patient's daughter at [phone] and left a detailed voicemail advising that vials have been located and when she is available to come in for her injections they will be ready. Advised to call back with any questions.

## 2019-07-02 ENCOUNTER — Ambulatory Visit (INDEPENDENT_AMBULATORY_CARE_PROVIDER_SITE_OTHER): Payer: Medicare Other | Admitting: *Deleted

## 2019-07-02 DIAGNOSIS — J309 Allergic rhinitis, unspecified: Secondary | ICD-10-CM | POA: Diagnosis not present

## 2019-07-02 NOTE — Telephone Encounter (Signed)
Patient has come in for allergy injections.

## 2019-07-05 ENCOUNTER — Other Ambulatory Visit: Payer: Self-pay | Admitting: *Deleted

## 2019-07-05 MED ORDER — IPRATROPIUM BROMIDE 0.06 % NA SOLN: NASAL | 0 refills | Status: DC

## 2019-07-06 ENCOUNTER — Ambulatory Visit (INDEPENDENT_AMBULATORY_CARE_PROVIDER_SITE_OTHER): Payer: Medicare Other | Admitting: Internal Medicine

## 2019-07-06 ENCOUNTER — Other Ambulatory Visit: Payer: Self-pay

## 2019-07-06 ENCOUNTER — Ambulatory Visit (INDEPENDENT_AMBULATORY_CARE_PROVIDER_SITE_OTHER): Payer: Medicare Other | Admitting: *Deleted

## 2019-07-06 ENCOUNTER — Encounter: Payer: Self-pay | Admitting: Internal Medicine

## 2019-07-06 ENCOUNTER — Telehealth: Payer: Self-pay | Admitting: Internal Medicine

## 2019-07-06 VITALS — BP 118/78 | HR 82 | Temp 98.1°F | Ht 69.0 in | Wt 249.0 lb

## 2019-07-06 DIAGNOSIS — E0590 Thyrotoxicosis, unspecified without thyrotoxic crisis or storm: Secondary | ICD-10-CM

## 2019-07-06 DIAGNOSIS — E0500 Thyrotoxicosis with diffuse goiter without thyrotoxic crisis or storm: Secondary | ICD-10-CM | POA: Diagnosis not present

## 2019-07-06 DIAGNOSIS — J309 Allergic rhinitis, unspecified: Secondary | ICD-10-CM | POA: Diagnosis not present

## 2019-07-06 LAB — TSH: TSH: <0.01 u[IU]/mL — ABNORMAL LOW (ref 0.35–4.50)

## 2019-07-06 LAB — T4, FREE: Free T4: 2.05 ng/dL — ABNORMAL HIGH (ref 0.60–1.60)

## 2019-07-06 NOTE — Telephone Encounter (Signed)
Pt stated that she had already made appt for labs and follow up and stated that she understands importance of taking medications as prescribed

## 2019-07-06 NOTE — Patient Instructions (Signed)
-   Please stop by the lab today and again in 6 weeks   We recommend that you follow these hyperthyroidism instructions at home:  1) Take Methimazole 20 mg daily ( 2 tablets daily )   If you develop severe sore throat with high fevers OR develop unexplained yellowing of your skin, eyes, under your tongue, severe abdominal pain with nausea or vomiting --> then please get evaluated immediately.   2) Get repeat thyroid labs in 6 weeks    It is ESSENTIAL to get follow-up labs to help avoid over or undertreatment of your hyperthyroidism - both of which can be dangerous to your health.

## 2019-07-06 NOTE — Progress Notes (Signed)
Name: [name]  MRN/ DOB: [id number], Jul 26, 1976    Age/ Sex: 43 y.o., female     PCP: Nolene Ebbs, MD   Reason for Endocrinology Evaluation: Hyperthyroidism      Initial Endocrinology Clinic Visit: 05/25/2019    PATIENT IDENTIFIER: Ms. [name] is a 43 y.o., female with a past medical history of Asthma, Pre-diabetes and GERD . She has followed with [facility] since 05/25/19 for consultative assistance with management of her hyperthyroidism.   HISTORICAL SUMMARY: The patient was first diagnosed with hyperthyroidism on routine lab work with a TSH < 0.01 uIU/mL and FT4 2.8 ng/dL. She was noted to have weight loss at the time and insomnia.    Methimazole started in 04/2019   Daughter with thyroid disease SUBJECTIVE:   During last visit (05/25/19): Methimazole started   Today (07/06/2019):  Ms. Scheck is here for hyperthyroid follow up.  Denies weight loss, palpitations  or diarrhea.   No local neck symptoms.  Pt admits to non-compliance with medications.   ROS:  As per HPI.   HISTORY:  Past Medical History:  Past Medical History:  Diagnosis Date  . Asthma   . Hydradenitis   . Obesity     Past Surgical History:  Past Surgical History:  Procedure Laterality Date  . CESAREAN SECTION     x2  . TUBAL LIGATION    . WISDOM TOOTH EXTRACTION  2011     Social History:  reports that she has been smoking cigarettes. She has been smoking about 0.33 packs per day. She has never used smokeless tobacco. She reports current alcohol use. She reports that she does not use drugs. Family History:  Family History  Problem Relation Age of Onset  . Diabetes Mother   . Hypertension Mother   . Diabetes Father   . Asthma Other   . Hyperlipidemia Other   . Hypertension Other       HOME MEDICATIONS: Allergies as of 07/06/2019      Reactions   Shellfish Allergy       Medication List       Accurate as of July 06, 2019  9:01 AM. If you  have any questions, ask your nurse or doctor.        AeroChamber Plus inhaler Use as instructed   albuterol 108 (90 Base) MCG/ACT inhaler Commonly known as: VENTOLIN HFA Inhale 2 puffs into the lungs every 4 (four) hours as needed for wheezing or shortness of breath.   azelastine 0.1 % nasal spray Commonly known as: ASTELIN Place 2 sprays into both nostrils 2 (two) times daily.   budesonide-formoterol 160-4.5 MCG/ACT inhaler Commonly known as: Symbicort TAKE 2 PUFFS BY MOUTH TWICE A DAY   doxycycline 100 MG capsule Commonly known as: MONODOX Take 100 mg by mouth 2 (two) times daily.   esomeprazole 40 MG capsule Commonly known as: NEXIUM Take 40 mg by mouth daily.   fluticasone 50 MCG/ACT nasal spray Commonly known as: FLONASE Place 1 spray into both nostrils daily.   ipratropium 0.06 % nasal spray Commonly known as: ATROVENT PLACE 2 SPRAYS INTO BOTH NOSTRILS 4 TIMES DAILY. 3-4 TIMES/ DAY   levocetirizine 5 MG tablet Commonly known as: XYZAL Take 1 tablet (5 mg total) by mouth daily as needed for allergies.   methimazole 10 MG tablet Commonly known as: TAPAZOLE Take 2 tablets (20 mg total) by mouth daily.         OBJECTIVE:   PHYSICAL EXAM: VS:  BP 118/78 (BP Location: Left Arm, Patient Position: Sitting, Cuff Size: Large)   Pulse 82   Temp 98.1 F (36.7 C)   Ht 5' 9"  (1.753 m)   Wt 249 lb (112.9 kg)   SpO2 99%   BMI 36.77 kg/m    EXAM: General: Pt appears well and is in NAD  Neck: General: Supple without adenopathy. Thyroid: Thyroid size enlarged . No thyroid bruit.  Lungs: Clear with good BS bilat with no rales, rhonchi, or wheezes  Heart: Auscultation: RRR.  Abdomen: Normoactive bowel sounds, soft, nontender, without masses or organomegaly palpable  Extremities:  BL LE: No pretibial edema normal ROM and strength.  Mental Status: Judgment, insight: Intact Orientation: Oriented to time, place, and person Memory: Intact for recent and remote  events Mood and affect: No depression, anxiety, or agitation     DATA REVIEWED:  03/08/2019  TSH < 0.01 uIU/mL  FT4 2.8 ng/dL (0.8-1.8) Total T3 241 ng/dL (76-181) Anti-TPO Ab > 900 IU/mL   Alk Phos 121 U/L   (31-125) AST   15 U/L (10-30)  ALT    11 U/L   Results for ARIEON, SCALZO (MRN [id number]) as of 07/06/2019 17:10  Ref. Range 05/25/2019 08:18 07/06/2019 09:15  TSH Latest Ref Range: 0.35 - 4.50 uIU/mL <0.01 (L) <0.01 Repeated and verified X2. (L)  T4,Free(Direct) Latest Ref Range: 0.60 - 1.60 ng/dL 2.55 (H) 2.05 (H)   Results for MATRACA, HUNKINS (MRN [id number]) as of 07/06/2019 17:10  Ref. Range 05/25/2019 08:18  TRAB Latest Ref Range: <=2.00 IU/L 2.71 (H)    ASSESSMENT / PLAN / RECOMMENDATIONS:   1. Hyperthyroidism Secondary to Graves' Disease:   - Pt is clinically euthyroid  - Her TFT's continue to be on hyperthyroid side due to methimazole non-compliance.  - We discussed the importance of compliance in helping her thyroid go into remission - We discussed the risk of cardiac arrhythmia and osteoporosis with uncontrolled thyroid     Medications  Continue Methimazole 20 mg daily    2. Graves' Disease:    - No extrathyroidal manifestations of graves' disease    Labs in 6 weeks   F/U in 3 months  Signed electronically by: Mack Guise, MD  [facility]  [facility] [address] [place], [place] [address] Phone: [phone] FAX: [phone]      CC: Nolene Ebbs, [place] [address] Phone: [phone]  Fax: [phone]   Return to Endocrinology clinic as below: Future Appointments  Date Time Provider [place]  08/12/2019  1:30 PM Valentina Shaggy, MD [facility] None

## 2019-07-06 NOTE — Telephone Encounter (Signed)
[name],    Please let her know that the week that she has missed has affected her thyroid in a negative way. Her thyroid is still overactive.   Please encourage her to use a pill box and recheck in 6 weeks.    Abby Nena Jordan, MD  [facility]  [facility] [address] [place], [place] [address] Phone: [phone] FAX: [phone]

## 2019-07-22 ENCOUNTER — Ambulatory Visit (INDEPENDENT_AMBULATORY_CARE_PROVIDER_SITE_OTHER): Payer: Medicare Other | Admitting: *Deleted

## 2019-07-22 DIAGNOSIS — J309 Allergic rhinitis, unspecified: Secondary | ICD-10-CM | POA: Diagnosis not present

## 2019-07-28 ENCOUNTER — Encounter: Payer: Self-pay | Admitting: *Deleted

## 2019-08-12 ENCOUNTER — Ambulatory Visit: Payer: Self-pay | Admitting: Allergy & Immunology

## 2019-08-18 ENCOUNTER — Other Ambulatory Visit: Payer: Medicare Other

## 2019-08-24 ENCOUNTER — Ambulatory Visit: Payer: Self-pay | Admitting: Allergy & Immunology

## 2019-09-07 ENCOUNTER — Other Ambulatory Visit (INDEPENDENT_AMBULATORY_CARE_PROVIDER_SITE_OTHER): Payer: Medicare Other

## 2019-09-07 ENCOUNTER — Other Ambulatory Visit: Payer: Self-pay

## 2019-09-07 DIAGNOSIS — E0590 Thyrotoxicosis, unspecified without thyrotoxic crisis or storm: Secondary | ICD-10-CM

## 2019-09-07 LAB — TSH: TSH: <0.01 u[IU]/mL — ABNORMAL LOW (ref 0.35–4.50)

## 2019-09-07 LAB — T4, FREE: Free T4: 0.66 ng/dL (ref 0.60–1.60)

## 2019-10-04 ENCOUNTER — Other Ambulatory Visit: Payer: Self-pay

## 2019-10-06 ENCOUNTER — Ambulatory Visit: Payer: Medicare Other | Admitting: Internal Medicine

## 2019-10-24 ENCOUNTER — Other Ambulatory Visit: Payer: Self-pay

## 2019-10-24 ENCOUNTER — Ambulatory Visit (HOSPITAL_COMMUNITY)
Admission: EM | Admit: 2019-10-24 | Discharge: 2019-10-24 | Disposition: A | Discharge status: Home / Self Care | Payer: Medicare Other | Attending: Family Medicine | Admitting: Family Medicine

## 2019-10-24 ENCOUNTER — Encounter (HOSPITAL_COMMUNITY): Payer: Self-pay | Admitting: Family Medicine

## 2019-10-24 DIAGNOSIS — E538 Deficiency of other specified B group vitamins: Secondary | ICD-10-CM

## 2019-10-24 DIAGNOSIS — B070 Plantar wart: Secondary | ICD-10-CM | POA: Diagnosis not present

## 2019-10-24 NOTE — Discharge Instructions (Addendum)
Restart your Vitamin B pills

## 2019-10-24 NOTE — ED Provider Notes (Signed)
[facility]    CSN: [id number] Arrival date & time: 10/24/19  1100      History   Chief Complaint Chief Complaint  Patient presents with  . Foot Problem    HPI [name] is a 43 y.o. female.   43 yo established [facility] patient here for left foot problem  Pt c/o L foot toe pain, states her second toe is swollen on her L foot. Denies injury. States its been going on since October. States the ball of her R foot is numb.   Also, patient has pain inner left heel.  Thought there was a splinter and she has been trimming the skin   Patient has been diagnosed with B12 deficiency, but she stopper her B12 replacement pills months ago     Past Medical History:  Diagnosis Date  . Asthma   . Hydradenitis   . Obesity     Patient Active Problem List   Diagnosis Date Noted  . Graves disease 07/06/2019  . Hyperthyroidism 07/06/2019  . Current smoker 05/26/2019  . Seasonal and perennial allergic rhinitis 03/25/2018  . 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 Perennial and seasonal allergic rhinitis 08/19/2017  . Moderate persistent asthma without complication 08/19/2017    Past Surgical History:  Procedure Laterality Date  . CESAREAN SECTION     x2  . TUBAL LIGATION    . WISDOM TOOTH EXTRACTION  2011    OB History   No obstetric history on file.      Home Medications    Prior to Admission medications   Medication Sig Start Date End Date Taking? Authorizing Provider  albuterol (VENTOLIN HFA) 108 (90 Base) MCG/ACT inhaler Inhale 2 puffs into the lungs every 4 (four) hours as needed for wheezing or shortness of breath. 05/25/19   Alfonse Spruce, MD  azelastine (ASTELIN) 0.1 % nasal spray Place 2 sprays into both nostrils 2 (two) times daily. 05/25/19   Alfonse Spruce, MD  budesonide-formoterol [facility]) 160-4.5 MCG/ACT inhaler TAKE 2 PUFFS BY MOUTH TWICE A DAY 05/25/19   Alfonse Spruce, MD  doxycycline (MONODOX)  100 MG capsule Take 100 mg by mouth 2 (two) times daily.    [provider]  esomeprazole (NEXIUM) 40 MG capsule Take 40 mg by mouth daily. 07/26/17   [provider]  fluticasone (FLONASE) 50 MCG/ACT nasal spray Place 1 spray into both nostrils daily. 05/25/19   Alfonse Spruce, MD  ipratropium (ATROVENT) 0.06 % nasal spray PLACE 2 SPRAYS INTO BOTH NOSTRILS 4 TIMES DAILY. 3-4 TIMES/ DAY 07/05/19   Alfonse Spruce, MD  levocetirizine (XYZAL) 5 MG tablet Take 1 tablet (5 mg total) by mouth daily as needed for allergies. 05/25/19   Alfonse Spruce, MD  methimazole (TAPAZOLE) 10 MG tablet Take 2 tablets (20 mg total) by mouth daily. 05/25/19   Shamleffer, Konrad Dolores, MD  Spacer/Aero-Holding Chambers (AEROCHAMBER PLUS) inhaler Use as instructed 08/05/17   Domenick Gong, MD    Family History Family History  Problem Relation Age of Onset  . Diabetes Mother   . Hypertension Mother   . Diabetes Father   . Asthma Other   . Hyperlipidemia Other   . Hypertension Other     Social History Social History   Tobacco Use  . Smoking status: Current Some Day Smoker    Packs/day: 0.33    Types: Cigarettes  . Smokeless tobacco: Never Used  Substance Use Topics  . Alcohol use:  Yes    Comment: once a month at a party beer wine or mixed drinks  . Drug use: No     Allergies   Shellfish allergy   Review of Systems Review of Systems   Physical Exam Triage Vital Signs ED Triage Vitals  Enc Vitals Group     BP      Pulse      Resp      Temp      Temp src      SpO2      Weight      Height      Head Circumference      Peak Flow      Pain Score      Pain Loc      Pain Edu?      Excl. in [place]?    No data found.  Updated Vital Signs BP 131/84   Pulse 74   Temp 98.3 F (36.8 C)   Resp 18   LMP 09/30/2019   SpO2 100%   Physical Exam Vitals signs and nursing note reviewed.  Constitutional:      Appearance: Normal appearance.  HENT:      Head: Normocephalic.  Eyes:     Conjunctiva/sclera: Conjunctivae normal.  Neck:     Musculoskeletal: Normal range of motion and neck supple.  Cardiovascular:     Rate and Rhythm: Normal rate.     Pulses: Normal pulses.  Pulmonary:     Effort: Pulmonary effort is normal.  Musculoskeletal: Normal range of motion.        General: Tenderness present.  Skin:    General: Skin is warm.     Capillary Refill: Capillary refill takes less than 2 seconds.     Comments: Diffuse axillary and breast hidradenitis  Wart trimmed medial left heel with No. 15 BP blade  Normal toes except diffuse callus formation of skin with poor foot hygiene.  Neurological:     General: No focal deficit present.     Mental Status: She is alert.     Sensory: Sensory deficit present.     Comments: Numbness dorsal both feet with good peripheral pulses.  Psychiatric:        Mood and Affect: Mood normal.      UC Treatments / Results  Labs (all labs ordered are listed, but only abnormal results are displayed) Labs Reviewed - No data to display  EKG   Radiology No results found.  Procedures Procedures (including critical care time)  Medications Ordered in UC Medications - No data to display  Initial Impression / Assessment and Plan / UC Course  I have reviewed the triage vital signs and the nursing notes.  Pertinent labs & imaging results that were available during my care of the patient were reviewed by me and considered in my medical decision making (see chart for details).    Final Clinical Impressions(s) / UC Diagnoses   Final diagnoses:  B12 deficiency  Plantar wart, left foot     Discharge Instructions     Restart your Vitamin B pills    ED Prescriptions    None     I have reviewed the PDMP during this encounter.   Robyn Haber, MD 10/24/19 1255

## 2019-10-24 NOTE — ED Triage Notes (Signed)
Pt c/o L foot toe pain, states her second toe is swollen on her L foot. Denies injury. States its been going on since October. States the ball of her R foot is numb.

## 2019-10-28 ENCOUNTER — Other Ambulatory Visit: Payer: Self-pay

## 2019-11-01 ENCOUNTER — Encounter: Payer: Self-pay | Admitting: Internal Medicine

## 2019-11-01 ENCOUNTER — Telehealth: Payer: Self-pay | Admitting: Internal Medicine

## 2019-11-01 ENCOUNTER — Ambulatory Visit (INDEPENDENT_AMBULATORY_CARE_PROVIDER_SITE_OTHER): Payer: Medicare Other | Admitting: Internal Medicine

## 2019-11-01 VITALS — BP 138/78 | HR 78 | Temp 98.8°F | Ht 69.0 in | Wt 285.2 lb

## 2019-11-01 DIAGNOSIS — E0590 Thyrotoxicosis, unspecified without thyrotoxic crisis or storm: Secondary | ICD-10-CM

## 2019-11-01 LAB — T4, FREE: Free T4: 0.58 ng/dL — ABNORMAL LOW (ref 0.60–1.60)

## 2019-11-01 LAB — TSH: TSH: 0.02 u[IU]/mL — ABNORMAL LOW (ref 0.35–4.50)

## 2019-11-01 MED ORDER — METHIMAZOLE 10 MG PO TABS: 15.0000 mg | ORAL_TABLET | Freq: Every day | ORAL | 3 refills | Status: DC

## 2019-11-01 NOTE — Telephone Encounter (Signed)
Please let her know to cut down on her Methimazole to ONE and HALF tablet (currently she is on 2 tabs )   Please advised her to start using a "pill box"  And to fill out weekly     Thanks    Abby Nena Jordan, MD  [facility]  [facility] [address] [place], [place] [address] Phone: [phone] FAX: [phone]

## 2019-11-01 NOTE — Patient Instructions (Signed)
-   Please stop by the lab today and again in 6 weeks   We recommend that you follow these hyperthyroidism instructions at home:  1) Take Methimazole as prescribed   If you develop severe sore throat with high fevers OR develop unexplained yellowing of your skin, eyes, under your tongue, severe abdominal pain with nausea or vomiting --> then please get evaluated immediately.   2) Get repeat thyroid labs in 6 weeks    It is ESSENTIAL to get follow-up labs to help avoid over or undertreatment of your hyperthyroidism - both of which can be dangerous to your health.

## 2019-11-01 NOTE — Telephone Encounter (Signed)
Pt aware of results and stated an understanding of instructions

## 2019-11-01 NOTE — Progress Notes (Signed)
Name: [name]  MRN/ DOB: [id number], 28-May-1976    Age/ Sex: 43 y.o., female     PCP: Nolene Ebbs, MD   Reason for Endocrinology Evaluation: Hyperthyroidism      Initial Endocrinology Clinic Visit: 05/25/2019    PATIENT IDENTIFIER: [name] is a 43 y.o., female with a past medical history of Asthma, Pre-diabetes and GERD . She has followed with [facility] since 05/25/19 for consultative assistance with management of her hyperthyroidism.   HISTORICAL SUMMARY: The patient was first diagnosed with hyperthyroidism on routine lab work with a TSH < 0.01 uIU/mL and FT4 2.8 ng/dL. She was noted to have weight loss at the time and insomnia.    Methimazole started in 04/2019  Daughter with thyroid disease SUBJECTIVE:   During last visit (07/06/19): Continued methimazole   Today (11/01/2019):  [name] is here for a 3 month follow up on hyperthyroid.   She endorses weight gain.  She also noted some constipation with chronic constipation   No local neck symptoms.  Pt admits to non-compliance with medications.   ROS:  As per HPI.   HISTORY:  Past Medical History:  Past Medical History:  Diagnosis Date  . Asthma   . Hydradenitis   . Obesity    Past Surgical History:  Past Surgical History:  Procedure Laterality Date  . CESAREAN SECTION     x2  . TUBAL LIGATION    . WISDOM TOOTH EXTRACTION  2011    Social History:  reports that she has been smoking cigarettes. She has been smoking about 0.33 packs per day. She has never used smokeless tobacco. She reports current alcohol use. She reports that she does not use drugs. Family History:  Family History  Problem Relation Age of Onset  . Diabetes Mother   . Hypertension Mother   . Diabetes Father   . Asthma Other   . Hyperlipidemia Other   . Hypertension Other      HOME MEDICATIONS: Allergies as of 11/01/2019      Reactions   Shellfish Allergy       Medication List       Accurate as of November 01, 2019  3:31 PM. If you have any questions, ask your nurse or doctor.        AeroChamber Plus inhaler Use as instructed   albuterol 108 (90 Base) MCG/ACT inhaler Commonly known as: VENTOLIN HFA Inhale 2 puffs into the lungs every 4 (four) hours as needed for wheezing or shortness of breath.   azelastine 0.1 % nasal spray Commonly known as: ASTELIN Place 2 sprays into both nostrils 2 (two) times daily.   budesonide-formoterol 160-4.5 MCG/ACT inhaler Commonly known as: Symbicort TAKE 2 PUFFS BY MOUTH TWICE A DAY   doxycycline 100 MG capsule Commonly known as: MONODOX Take 100 mg by mouth 2 (two) times daily.   esomeprazole 40 MG capsule Commonly known as: NEXIUM Take 40 mg by mouth daily.   fluticasone 50 MCG/ACT nasal spray Commonly known as: FLONASE Place 1 spray into both nostrils daily.   ipratropium 0.06 % nasal spray Commonly known as: ATROVENT PLACE 2 SPRAYS INTO BOTH NOSTRILS 4 TIMES DAILY. 3-4 TIMES/ DAY   levocetirizine 5 MG tablet Commonly known as: XYZAL Take 1 tablet (5 mg total) by mouth daily as needed for allergies.   methimazole 10 MG tablet Commonly known as: TAPAZOLE Take 2 tablets (20 mg total) by mouth daily.  OBJECTIVE:   PHYSICAL EXAM: VS: BP 138/78 (BP Location: Left Arm, Patient Position: Sitting, Cuff Size: Large)   Pulse 78   Temp 98.8 F (37.1 C)   Ht 5' 9" (1.753 m)   Wt 285 lb 3.2 oz (129.4 kg)   SpO2 97%   BMI 42.12 kg/m    EXAM: General: Pt appears well and is in NAD  Neck: General: Supple without adenopathy. Thyroid: Thyroid size enlarged . No thyroid bruit.  Lungs: Clear with good BS bilat with no rales, rhonchi, or wheezes  Heart: Auscultation: RRR.  Abdomen: Normoactive bowel sounds, soft, nontender, without masses or organomegaly palpable  Extremities:  BL LE: No pretibial edema normal ROM and strength.  Mental Status: Judgment, insight: Intact Orientation: Oriented to time,  place, and person Memory: Intact for recent and remote events Mood and affect: No depression, anxiety, or agitation     DATA REVIEWED:  03/08/2019  TSH < 0.01 uIU/mL  FT4 2.8 ng/dL (0.8-1.8) Total T3 241 ng/dL (76-181) Anti-TPO Ab > 900 IU/mL   Alk Phos 121 U/Combs   (31-125) AST   15 U/Combs (10-30)  ALT    11 U/Combs   Results for Combs, [name] (MRN [id number]) as of 07/06/2019 17:10  Ref. Range 05/25/2019 08:18  TRAB Latest Ref Range: <=2.00 IU/Combs 2.71 (H)   Results for Combs, [name] (MRN [id number]) as of 11/01/2019 15:31  Ref. Range 11/01/2019 09:49  TSH Latest Ref Range: 0.35 - 4.50 uIU/mL 0.02 (Combs)  T4,Free(Direct) Latest Ref Range: 0.60 - 1.60 ng/dL 0.58 (Combs)   ASSESSMENT / PLAN / RECOMMENDATIONS:   1. Hyperthyroidism Secondary to Graves' Disease:   - Pt has been noted with weight gain which is partly due to hypothyroid status at this time.  - Will reduce methimazole dose. Interestingly enough her TSH continues to be low despite a low FT4 , one explanation is imperfect adherence and taking the TSh longer to respond.  - We discussed the importance of compliance in helping her thyroid go into remission - She is tolerating methimazole without side effects    Medications  Decrease  Methimazole 10 mg, ONE and HALF  tablets daily    2. Graves' Disease:   - No extrathyroidal manifestations of graves' disease    Labs in 6 weeks F/U in 3 months      Signed electronically by: Destiny Jaralla Shamleffer, MD  [facility]  [facility] [address] [place], [place] [address] Phone: [phone] FAX: [phone]      CC: Combs, Edwin, MD [address] [place] [address] Phone: [phone]  Fax: [phone]   Return to Endocrinology clinic as below: Future Appointments  Date Time Provider Department Center  12/15/2019  8:00 AM [facility] None  02/02/2020  9:10 AM Combs, Destiny Jaralla, MD  [facility] None     

## 2019-11-13 ENCOUNTER — Other Ambulatory Visit: Payer: Self-pay

## 2019-11-13 ENCOUNTER — Ambulatory Visit (HOSPITAL_COMMUNITY)
Admission: EM | Admit: 2019-11-13 | Discharge: 2019-11-13 | Disposition: A | Discharge status: Home / Self Care | Payer: Medicare Other | Attending: Urgent Care | Admitting: Urgent Care

## 2019-11-13 ENCOUNTER — Encounter (HOSPITAL_COMMUNITY): Payer: Self-pay

## 2019-11-13 DIAGNOSIS — R05 Cough: Secondary | ICD-10-CM | POA: Diagnosis present

## 2019-11-13 DIAGNOSIS — Z20828 Contact with and (suspected) exposure to other viral communicable diseases: Secondary | ICD-10-CM | POA: Diagnosis present

## 2019-11-13 DIAGNOSIS — R0602 Shortness of breath: Secondary | ICD-10-CM | POA: Insufficient documentation

## 2019-11-13 DIAGNOSIS — R062 Wheezing: Secondary | ICD-10-CM | POA: Diagnosis present

## 2019-11-13 DIAGNOSIS — Z20822 Contact with and (suspected) exposure to covid-19: Secondary | ICD-10-CM

## 2019-11-13 DIAGNOSIS — J4540 Moderate persistent asthma, uncomplicated: Secondary | ICD-10-CM | POA: Insufficient documentation

## 2019-11-13 DIAGNOSIS — R059 Cough, unspecified: Secondary | ICD-10-CM

## 2019-11-13 LAB — POC SARS CORONAVIRUS 2 AG: SARS Coronavirus 2 Ag: NEGATIVE

## 2019-11-13 LAB — POC SARS CORONAVIRUS 2 AG -  ED: SARS Coronavirus 2 Ag: NEGATIVE

## 2019-11-13 MED ORDER — PROMETHAZINE-DM 6.25-15 MG/5ML PO SYRP: 5.0000 mL | ORAL_SOLUTION | Freq: Every evening | ORAL | 0 refills | Status: DC | PRN

## 2019-11-13 MED ORDER — CEFDINIR 300 MG PO CAPS: 300.0000 mg | ORAL_CAPSULE | Freq: Every day | ORAL | 0 refills | Status: DC

## 2019-11-13 MED ORDER — ALBUTEROL SULFATE (2.5 MG/3ML) 0.083% IN NEBU: 2.5000 mg | INHALATION_SOLUTION | Freq: Four times a day (QID) | RESPIRATORY_TRACT | 0 refills | Status: DC | PRN

## 2019-11-13 MED ORDER — BENZONATATE 100 MG PO CAPS: 100.0000 mg | ORAL_CAPSULE | Freq: Three times a day (TID) | ORAL | 0 refills | Status: DC | PRN

## 2019-11-13 MED ORDER — PREDNISONE 20 MG PO TABS: ORAL_TABLET | ORAL | 0 refills | Status: DC

## 2019-11-13 MED ORDER — AZITHROMYCIN 500 MG PO TABS: 500.0000 mg | ORAL_TABLET | Freq: Every day | ORAL | 0 refills | Status: DC

## 2019-11-13 NOTE — ED Triage Notes (Signed)
Pt present asthma and SOB. Symptoms started 3 wks ago. Pt also would like to be tested for covid 19. She has been exposed by a family member who tested positive

## 2019-11-13 NOTE — ED Provider Notes (Signed)
[facility]   MRN: [id number] DOB: 1976-06-06  Subjective:   [name] is a 43 y.o. female presenting for 3 week hx of persistent moderate malaise including dry cough, wheezing, shob. Patient has longstanding hx of asthma. She is using her nebulized albuterol daily, Symbicort as well. She is a smoker. Has had exposure to COVID 19 last week from one of her family members.   No current facility-administered medications for this encounter.  Current Outpatient Medications:  .  albuterol (VENTOLIN HFA) 108 (90 Base) MCG/ACT inhaler, Inhale 2 puffs into the lungs every 4 (four) hours as needed for wheezing or shortness of breath., Disp: 18 g, Rfl: 1 .  azelastine (ASTELIN) 0.1 % nasal spray, Place 2 sprays into both nostrils 2 (two) times daily., Disp: 30 mL, Rfl: 5 .  budesonide-formoterol (SYMBICORT) 160-4.5 MCG/ACT inhaler, TAKE 2 PUFFS BY MOUTH TWICE A DAY, Disp: 30.6 Inhaler, Rfl: 0 .  doxycycline (MONODOX) 100 MG capsule, Take 100 mg by mouth 2 (two) times daily., Disp: , Rfl:  .  esomeprazole (NEXIUM) 40 MG capsule, Take 40 mg by mouth daily., Disp: , Rfl: 5 .  fluticasone (FLONASE) 50 MCG/ACT nasal spray, Place 1 spray into both nostrils daily., Disp: 16 g, Rfl: 2 .  ipratropium (ATROVENT) 0.06 % nasal spray, PLACE 2 SPRAYS INTO BOTH NOSTRILS 4 TIMES DAILY. 3-4 TIMES/ DAY, Disp: 45 mL, Rfl: 0 .  levocetirizine (XYZAL) 5 MG tablet, Take 1 tablet (5 mg total) by mouth daily as needed for allergies., Disp: 30 tablet, Rfl: 5 .  methimazole (TAPAZOLE) 10 MG tablet, Take 1.5 tablets (15 mg total) by mouth daily., Disp: 45 tablet, Rfl: 3 .  Spacer/Aero-Holding Chambers (AEROCHAMBER PLUS) inhaler, Use as instructed, Disp: 1 each, Rfl: 2   Allergies  Allergen Reactions  . Shellfish Allergy     Past Medical History:  Diagnosis Date  . Asthma   . Hydradenitis   . Obesity      Past Surgical History:  Procedure Laterality Date  . CESAREAN SECTION     x2  . TUBAL  LIGATION    . WISDOM TOOTH EXTRACTION  2011    Family History  Problem Relation Age of Onset  . Diabetes Mother   . Hypertension Mother   . Diabetes Father   . Asthma Other   . Hyperlipidemia Other   . Hypertension Other     Social History   Tobacco Use  . Smoking status: Current Some Day Smoker    Packs/day: 0.33    Types: Cigarettes  . Smokeless tobacco: Never Used  Substance Use Topics  . Alcohol use: Yes    Comment: once a month at a party beer wine or mixed drinks  . Drug use: No    Review of Systems  Constitutional: Positive for malaise/fatigue. Negative for fever.  HENT: Negative for congestion, ear pain, sinus pain and sore throat.   Eyes: Negative for discharge and redness.  Respiratory: Positive for cough, shortness of breath and wheezing. Negative for hemoptysis.   Cardiovascular: Negative for chest pain.  Gastrointestinal: Negative for abdominal pain, diarrhea, nausea and vomiting.  Genitourinary: Negative for dysuria, flank pain and hematuria.  Musculoskeletal: Negative for myalgias.  Skin: Negative for rash.  Neurological: Negative for dizziness, weakness and headaches.  Psychiatric/Behavioral: Negative for depression and substance abuse.     Objective:   Vitals: BP 125/64 (BP Location: Left Arm)   Pulse 100   Temp 100.1 F (37.8 C) (Oral)   Resp 18  LMP 11/13/2019   SpO2 100%   Physical Exam Constitutional:      General: She is not in acute distress.    Appearance: Normal appearance. She is well-developed. She is obese. She is not ill-appearing, toxic-appearing or diaphoretic.  HENT:     Head: Normocephalic and atraumatic.     Right Ear: Tympanic membrane and ear canal normal. No drainage or tenderness. No middle ear effusion. Tympanic membrane is not erythematous.     Left Ear: Tympanic membrane and ear canal normal. No drainage or tenderness.  No middle ear effusion. Tympanic membrane is not erythematous.     Nose: Nose normal. No  congestion or rhinorrhea.     Mouth/Throat:     Mouth: Mucous membranes are moist. No oral lesions.     Pharynx: No pharyngeal swelling, oropharyngeal exudate, posterior oropharyngeal erythema or uvula swelling.     Tonsils: No tonsillar exudate or tonsillar abscesses.  Eyes:     Extraocular Movements: Extraocular movements intact.     Right eye: Normal extraocular motion.     Left eye: Normal extraocular motion.     Conjunctiva/sclera: Conjunctivae normal.     Pupils: Pupils are equal, round, and reactive to light.  Cardiovascular:     Rate and Rhythm: Normal rate and regular rhythm.     Pulses: Normal pulses.     Heart sounds: Normal heart sounds. No murmur. No friction rub. No gallop.   Pulmonary:     Effort: Pulmonary effort is normal. No respiratory distress.     Breath sounds: No stridor. Wheezing (mild-mid lung fields) present. No rhonchi or rales.     Comments: Lung exam difficult due to body habitus. Mild wheezing auscultated.  Musculoskeletal:     Cervical back: Normal range of motion and neck supple.  Lymphadenopathy:     Cervical: No cervical adenopathy.  Skin:    General: Skin is warm and dry.     Findings: No rash.  Neurological:     General: No focal deficit present.     Mental Status: She is alert and oriented to person, place, and time.  Psychiatric:        Mood and Affect: Mood normal.        Behavior: Behavior normal.        Thought Content: Thought content normal.     Results for orders placed or performed during the hospital encounter of 11/13/19 (from the past 24 hour(s))  POC SARS Coronavirus 2 Ag-ED - Nasal Swab (BD Veritor Kit)     Status: None   Collection Time: 11/13/19  3:45 PM  Result Value Ref Range   SARS Coronavirus 2 Ag NEGATIVE NEGATIVE  POC SARS Coronavirus 2 Ag     Status: None   Collection Time: 11/13/19  3:45 PM  Result Value Ref Range   SARS Coronavirus 2 Ag NEGATIVE NEGATIVE    Assessment and Plan :   1. Cough   2. Shortness of  breath   3. Wheezing   4. Moderate persistent extrinsic asthma without complication   5. Exposure to COVID-19 virus   6. Morbid obesity ([place])     Will cover for clinical diagnosis of pneumonia with azithromycin, cefdinir as per UpToDate. Use short steroid course to address her asthma. Maintain all other medications. COVID 19 testing is pending. Counseled patient on potential for adverse effects with medications prescribed/recommended today, ER and return-to-clinic precautions discussed, patient verbalized understanding.    Jaynee Eagles, PA-C 11/13/19 [id number]

## 2019-11-14 LAB — NOVEL CORONAVIRUS, NAA (HOSP ORDER, SEND-OUT TO REF LAB; TAT 18-24 HRS): SARS-CoV-2, NAA: NOT DETECTED

## 2019-12-15 ENCOUNTER — Other Ambulatory Visit: Payer: Medicare Other

## 2020-01-27 ENCOUNTER — Other Ambulatory Visit (INDEPENDENT_AMBULATORY_CARE_PROVIDER_SITE_OTHER): Payer: Medicare Other

## 2020-01-27 ENCOUNTER — Other Ambulatory Visit: Payer: Self-pay

## 2020-01-27 DIAGNOSIS — E0590 Thyrotoxicosis, unspecified without thyrotoxic crisis or storm: Secondary | ICD-10-CM | POA: Diagnosis not present

## 2020-01-27 LAB — TSH: TSH: 0.17 u[IU]/mL — ABNORMAL LOW (ref 0.35–4.50)

## 2020-01-27 LAB — T4, FREE: Free T4: 0.73 ng/dL (ref 0.60–1.60)

## 2020-02-02 ENCOUNTER — Ambulatory Visit: Payer: Medicare Other | Admitting: Internal Medicine

## 2020-02-02 NOTE — Progress Notes (Deleted)
Name: [name]  MRN/ DOB: [id number], July 13, 1976    Age/ Sex: 44 y.o., female     PCP: Nolene Ebbs, MD   Reason for Endocrinology Evaluation: Hyperthyroidism      Initial Endocrinology Clinic Visit: 05/25/2019    PATIENT IDENTIFIER: [name] is a 44 y.o., female with a past medical history of Asthma, Pre-diabetes and GERD . She has followed with [facility] since 05/25/19 for consultative assistance with management of her hyperthyroidism.   HISTORICAL SUMMARY: The patient was first diagnosed with hyperthyroidism on routine lab work with a TSH < 0.01 uIU/mL and FT4 2.8 ng/dL. She was noted to have weight loss at the time and insomnia.    Methimazole started in 04/2019  Daughter with thyroid disease SUBJECTIVE:   During last visit (11/01/19): Decreased  methimazole   Today (02/02/2020):  [name] is here for a 3 month follow up on hyperthyroid.   She endorses weight gain.  She also noted some constipation with chronic constipation   No local neck symptoms.  Pt admits to non-compliance with medications.     Methimazole 10 mg, 1.5 tabs daily  ROS:  As per HPI.   HISTORY:  Past Medical History:  Past Medical History:  Diagnosis Date  . Asthma   . Hydradenitis   . Obesity    Past Surgical History:  Past Surgical History:  Procedure Laterality Date  . CESAREAN SECTION     x2  . TUBAL LIGATION    . WISDOM TOOTH EXTRACTION  2011    Social History:  reports that she has been smoking cigarettes. She has been smoking about 0.33 packs per day. She has never used smokeless tobacco. She reports current alcohol use. She reports that she does not use drugs. Family History:  Family History  Problem Relation Age of Onset  . Diabetes Mother   . Hypertension Mother   . Diabetes Father   . Asthma Other   . Hyperlipidemia Other   . Hypertension Other      HOME MEDICATIONS: Allergies as of 02/02/2020      Reactions   Shellfish  Allergy       Medication List       Accurate as of February 02, 2020  7:38 AM. If you have any questions, ask your nurse or doctor.        AeroChamber Plus inhaler Use as instructed   albuterol 108 (90 Base) MCG/ACT inhaler Commonly known as: VENTOLIN HFA Inhale 2 puffs into the lungs every 4 (four) hours as needed for wheezing or shortness of breath.   albuterol (2.5 MG/3ML) 0.083% nebulizer solution Commonly known as: PROVENTIL Take 3 mLs (2.5 mg total) by nebulization every 6 (six) hours as needed for wheezing or shortness of breath.   azelastine 0.1 % nasal spray Commonly known as: ASTELIN Place 2 sprays into both nostrils 2 (two) times daily.   azithromycin 500 MG tablet Commonly known as: ZITHROMAX Take 1 tablet (500 mg total) by mouth daily.   benzonatate 100 MG capsule Commonly known as: TESSALON Take 1-2 capsules (100-200 mg total) by mouth 3 (three) times daily as needed.   budesonide-formoterol 160-4.5 MCG/ACT inhaler Commonly known as: Symbicort TAKE 2 PUFFS BY MOUTH TWICE A DAY   cefdinir 300 MG capsule Commonly known as: OMNICEF Take 1 capsule (300 mg total) by mouth daily.   doxycycline 100 MG capsule Commonly known as: MONODOX Take 100 mg by mouth 2 (two) times daily.  esomeprazole 40 MG capsule Commonly known as: NEXIUM Take 40 mg by mouth daily.   fluticasone 50 MCG/ACT nasal spray Commonly known as: FLONASE Place 1 spray into both nostrils daily.   ipratropium 0.06 % nasal spray Commonly known as: ATROVENT PLACE 2 SPRAYS INTO BOTH NOSTRILS 4 TIMES DAILY. 3-4 TIMES/ DAY   levocetirizine 5 MG tablet Commonly known as: XYZAL Take 1 tablet (5 mg total) by mouth daily as needed for allergies.   methimazole 10 MG tablet Commonly known as: TAPAZOLE Take 1.5 tablets (15 mg total) by mouth daily.   predniSONE 20 MG tablet Commonly known as: DELTASONE Take 2 tablets daily with breakfast.   promethazine-dextromethorphan 6.25-15 MG/5ML  syrup Commonly known as: PROMETHAZINE-DM Take 5 mLs by mouth at bedtime as needed for cough.         OBJECTIVE:   PHYSICAL EXAM: VS: There were no vitals taken for this visit.   EXAM: General: Pt appears well and is in NAD  Neck: General: Supple without adenopathy. Thyroid: Thyroid size enlarged . No thyroid bruit.  Lungs: Clear with good BS bilat with no rales, rhonchi, or wheezes  Heart: Auscultation: RRR.  Abdomen: Normoactive bowel sounds, soft, nontender, without masses or organomegaly palpable  Extremities:  BL LE: No pretibial edema normal ROM and strength.  Mental Status: Judgment, insight: Intact Orientation: Oriented to time, place, and person Memory: Intact for recent and remote events Mood and affect: No depression, anxiety, or agitation     DATA REVIEWED:  03/08/2019  TSH < 0.01 uIU/mL  FT4 2.8 ng/dL (0.8-1.8) Total T3 241 ng/dL (76-181) Anti-TPO Ab > 900 IU/mL   Alk Phos 121 U/L   (31-125) AST   15 U/L (10-30)  ALT    11 U/L   Results for KEMYA, SHED (MRN [id number]) as of 07/06/2019 17:10  Ref. Range 05/25/2019 08:18  TRAB Latest Ref Range: <=2.00 IU/L 2.71 (H)   Results for TONDALAYA, PERREN (MRN [id number]) as of 11/01/2019 15:31  Ref. Range 11/01/2019 09:49  TSH Latest Ref Range: 0.35 - 4.50 uIU/mL 0.02 (L)  T4,Free(Direct) Latest Ref Range: 0.60 - 1.60 ng/dL 0.58 (L)   ASSESSMENT / PLAN / RECOMMENDATIONS:   1. Hyperthyroidism Secondary to Graves' Disease:   - Pt has been noted with weight gain which is partly due to hypothyroid status at this time.  - Will reduce methimazole dose. Interestingly enough her TSH continues to be low despite a low FT4 , one explanation is imperfect adherence and taking the TSh longer to respond.  - We discussed the importance of compliance in helping her thyroid go into remission - She is tolerating methimazole without side effects    Medications  Decrease  Methimazole 10 mg, ONE and HALF  tablets daily     2. Graves' Disease:   - No extrathyroidal manifestations of graves' disease    Labs in 6 weeks F/U in 3 months      Signed electronically by: Mack Guise, MD  [facility]  [facility] [address] [place], [place] [address] Phone: [phone] FAX: [phone]      CC: Nolene Ebbs, [place] [address] Phone: [phone]  Fax: [phone]   Return to Endocrinology clinic as below: Future Appointments  Date Time Provider [place]  02/02/2020  9:10 AM Shamleffer, Melanie Crazier, MD [facility] None

## 2020-02-04 ENCOUNTER — Other Ambulatory Visit: Payer: Self-pay | Admitting: Internal Medicine

## 2020-04-11 ENCOUNTER — Encounter: Payer: Self-pay | Admitting: Allergy & Immunology

## 2020-04-11 ENCOUNTER — Other Ambulatory Visit: Payer: Self-pay

## 2020-04-11 ENCOUNTER — Ambulatory Visit (INDEPENDENT_AMBULATORY_CARE_PROVIDER_SITE_OTHER): Payer: Medicare Other | Admitting: Allergy & Immunology

## 2020-04-11 VITALS — BP 126/78 | HR 78 | Temp 97.5°F | Resp 18 | Ht 69.0 in | Wt 310.4 lb

## 2020-04-11 DIAGNOSIS — J4540 Moderate persistent asthma, uncomplicated: Secondary | ICD-10-CM | POA: Diagnosis not present

## 2020-04-11 DIAGNOSIS — T7800XD Anaphylactic reaction due to unspecified food, subsequent encounter: Secondary | ICD-10-CM

## 2020-04-11 DIAGNOSIS — J3089 Other allergic rhinitis: Secondary | ICD-10-CM | POA: Diagnosis not present

## 2020-04-11 MED ORDER — EPINEPHRINE 0.3 MG/0.3ML IJ SOAJ: 0.3000 mg | INTRAMUSCULAR | 2 refills | Status: DC | PRN

## 2020-04-11 MED ORDER — AZELASTINE HCL 0.1 % NA SOLN: 2.0000 | Freq: Two times a day (BID) | NASAL | 6 refills | Status: DC

## 2020-04-11 MED ORDER — ALBUTEROL SULFATE (2.5 MG/3ML) 0.083% IN NEBU: 2.5000 mg | INHALATION_SOLUTION | RESPIRATORY_TRACT | 1 refills | Status: DC | PRN

## 2020-04-11 MED ORDER — ESOMEPRAZOLE MAGNESIUM 40 MG PO CPDR: 40.0000 mg | DELAYED_RELEASE_CAPSULE | Freq: Every day | ORAL | 6 refills | Status: DC

## 2020-04-11 MED ORDER — IPRATROPIUM BROMIDE 0.06 % NA SOLN: NASAL | 1 refills | Status: DC

## 2020-04-11 MED ORDER — ALBUTEROL SULFATE HFA 108 (90 BASE) MCG/ACT IN AERS: 2.0000 | INHALATION_SPRAY | RESPIRATORY_TRACT | 1 refills | Status: DC | PRN

## 2020-04-11 MED ORDER — LEVOCETIRIZINE DIHYDROCHLORIDE 5 MG PO TABS: 5.0000 mg | ORAL_TABLET | Freq: Every day | ORAL | 6 refills | Status: DC | PRN

## 2020-04-11 MED ORDER — BUDESONIDE-FORMOTEROL FUMARATE 160-4.5 MCG/ACT IN AERO: INHALATION_SPRAY | RESPIRATORY_TRACT | 1 refills | Status: DC

## 2020-04-11 MED ORDER — FLUTICASONE PROPIONATE 50 MCG/ACT NA SUSP: 1.0000 | Freq: Every day | NASAL | 6 refills | Status: DC

## 2020-04-11 NOTE — Progress Notes (Signed)
FOLLOW UP  Date of Service/Encounter:  04/11/20   Assessment:   Seasonal and perennial allergic rhinitis(grasses, weeds, ragweed, trees, molds, cat, dog, cockroach, and dust mite)  Moderate persistent asthma,uncomplicated  Anaphylactic shock due to food  Non-compliance   Asthma Reportables: Severity:moderate persistent Risk:high Control:well controlled   Plan/Recommendations:   1. Moderate persistent asthma, uncomplicated - Lung testing looked terrible today. - We gave you albuterol with improvement in your symptoms. - I think you need to restart your medications, so hopefully we can avoid prednisone.  - Daily controller medication(s): Symbicort 160/4.31mcg two puffs twice daily with spacer  - Prior to physical activity: ProAir 2 puffs 10-15 minutes before physical activity. - Rescue medications: ProAir 4 puffs every 4-6 hours as needed or albuterol nebulizer one vial puffs every 4-6 hours as needed - Asthma control goals:  * Full participation in all desired activities (may need albuterol before activity) * Albuterol use two time or less a week on average (not counting use with activity) * Cough interfering with sleep two time or less a month * Oral steroids no more than once a year * No hospitalizations  2. Anaphylactic shock due to food (shellfish) - EpiPen refilled.    3. Perennial and seasonal allergic rhinitis (grasses, weeds, ragweed, trees, molds, cat, dog, cockroach, and dust mite) - Restart fluticasone nasal spray two sprays per nostril daily EVERY DAY (brown bottle).  - Restart azelastine nasal spray two sprays per nostril daily AS NEEDED.  - Restart levocetirizine 5mg  tablet once daily.  - Restart montelukast (Singulair) 10mg  daily.    4. Return in about 6 months (around 10/12/2020). This can be an in-person, a virtual Webex or a telephone follow up visit.   Subjective:   [name] is a 44 y.o. female presenting today for follow  up of  Chief Complaint  Patient presents with  . Follow-up    having a sharp pain in the middle of her chest  . Asthma    coughing, wheezing, SOB, coughing at night    [name] has a history of the following: Patient Active Problem List   Diagnosis Date Noted  . Graves disease 07/06/2019  . Hyperthyroidism 07/06/2019  . Current smoker 05/26/2019  . Seasonal and perennial allergic rhinitis 03/25/2018  . 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 Perennial and seasonal allergic rhinitis 08/19/2017  . Moderate persistent asthma without complication 15/72/6203    History obtained from: chart review and patient.  [name] is a 44 y.o. female presenting for a follow up visit. She was last seen in June 2020. At that time, she was doing well on Symbicort 160/4.5 mcg two puffs BID as well as albuterol as needed.  For her rhinitis, we continue with Flonase, Astelin, Xyzal, and Singulair.  We did recommend making an appointment to restart shots.  She we did restart her shots and continued for only 4 injections before stopping.  Since the last visit, she has not done well.   Asthma/Respiratory Symptom History: She reports that she is coughing and wheezing. She has been out of her medications for several months now.  She tells me that she let her daughter use her albuterol because her daughter's workplace was requiring that she have an albuterol at the workplace to use.  She has not been to the emergency room for breathing, although review of her chart shows that she was seen in December 2020 in the ER for shortness of breath and cough.  Allergic Rhinitis Symptom History: She is no longer on fluicasone, azelastine, levocetirizine, and montelukast.  She is very confused by her nose sprays and said that she has too many of them.  She is wondering which when she needs to use every day versus as needed.  Food Allergy Symptom History: She continues to avoid the shellfish. She  thinks her EpiPen is up-to-date.  Otherwise, there have been no changes to her past medical history, surgical history, family history, or social history.    Review of Systems  Constitutional: Negative.  Negative for chills, fever, malaise/fatigue and weight loss.  HENT: Negative.  Negative for congestion, ear discharge, ear pain, sinus pain and sore throat.   Eyes: Negative for pain, discharge and redness.  Respiratory: Positive for cough and wheezing. Negative for sputum production and shortness of breath.   Cardiovascular: Negative.  Negative for chest pain and palpitations.  Gastrointestinal: Negative for abdominal pain, constipation, diarrhea, heartburn, nausea and vomiting.  Skin: Negative.  Negative for itching and rash.  Neurological: Negative for dizziness and headaches.  Endo/Heme/Allergies: Negative for environmental allergies. Does not bruise/bleed easily.       Objective:   Blood pressure 126/78, pulse 78, temperature (!) 97.5 F (36.4 C), temperature source Temporal, resp. rate 18, height 5\' 9"  (1.753 m), weight (!) 310 lb 6.4 oz (140.8 kg), SpO2 98 %. Body mass index is 45.84 kg/m.   Physical Exam:  Physical Exam  Constitutional: She appears well-developed and well-nourished.  Obese female.  HENT:  Head: Normocephalic and atraumatic.  Right Ear: Tympanic membrane, external ear and ear canal normal.  Left Ear: Tympanic membrane, external ear and ear canal normal.  Nose: Mucosal edema and rhinorrhea present. No nasal deformity or septal deviation. No epistaxis. Right sinus exhibits no maxillary sinus tenderness and no frontal sinus tenderness. Left sinus exhibits no maxillary sinus tenderness and no frontal sinus tenderness.  Mouth/Throat: Uvula is midline and oropharynx is clear and moist. Mucous membranes are not pale and not dry.  No polyps appreciated.  Cobblestoning present in the posterior oropharynx.  Eyes: Pupils are equal, round, and reactive to light.  Conjunctivae and EOM are normal. Right eye exhibits no chemosis and no discharge. Left eye exhibits no chemosis and no discharge. Right conjunctiva is not injected. Left conjunctiva is not injected.  Cardiovascular: Normal rate, regular rhythm and normal heart sounds.  Respiratory: Effort normal and breath sounds normal. No accessory muscle usage. No tachypnea. No respiratory distress. She has no wheezes. She has no rhonchi. She has no rales. She exhibits no tenderness.  Wheezes throughout both lung fields.  No increased work of breathing.  Speaking in full sentences.  Lymphadenopathy:    She has no cervical adenopathy.  Neurological: She is alert.  Skin: No abrasion, no petechiae and no rash noted. Rash is not papular, not vesicular and not urticarial. No erythema. No pallor.  Psychiatric: She has a normal mood and affect.     Diagnostic studies:    Spirometry: results abnormal (FEV1: 1.60/55%, FVC: 2.46/68%, FEV1/FVC: 65%).    Spirometry consistent with mixed obstructive and restrictive disease. Xopenex four puffs via MDI treatment given in clinic with significant improvement in FEV1 and FVC per ATS criteria.  Allergy Studies: none      , MD  [facility]

## 2020-04-11 NOTE — Addendum Note (Signed)
Addended by: Teressa Senter on: 04/11/2020 06:31 PM   Modules accepted: Orders

## 2020-04-11 NOTE — Patient Instructions (Addendum)
1. Moderate persistent asthma, uncomplicated - Lung testing looked terrible today. - We gave you albuterol with improvement in your symptoms. - I think you need to restart your medications, so hopefully we can avoid prednisone.  - Daily controller medication(s): Symbicort 160/4.77mcg two puffs twice daily with spacer  - Prior to physical activity: ProAir 2 puffs 10-15 minutes before physical activity. - Rescue medications: ProAir 4 puffs every 4-6 hours as needed or albuterol nebulizer one vial puffs every 4-6 hours as needed - Asthma control goals:  * Full participation in all desired activities (may need albuterol before activity) * Albuterol use two time or less a week on average (not counting use with activity) * Cough interfering with sleep two time or less a month * Oral steroids no more than once a year * No hospitalizations  2. Anaphylactic shock due to food (shellfish) - EpiPen refilled.    3. Perennial and seasonal allergic rhinitis (grasses, weeds, ragweed, trees, molds, cat, dog, cockroach, and dust mite) - Restart fluticasone nasal spray two sprays per nostril daily EVERY DAY (brown bottle).  - Restart azelastine nasal spray two sprays per nostril daily AS NEEDED.  - Restart levocetirizine 5mg  tablet once daily.  - Restart montelukast (Singulair) 10mg  daily.    4. Return in about 6 months (around 10/12/2020). This can be an in-person, a virtual Webex or a telephone follow up visit.   Please inform of any Emergency Department visits, hospitalizations, or changes in symptoms. Call 10/14/2020 before going to the ED for breathing or allergy symptoms since we might be able to fit you in for a sick visit. Feel free to contact us anytime with any questions, problems, or concerns.  It was a pleasure to see you again today!  Websites that have reliable patient information: 1. American Academy of Asthma, Allergy, and Immunology: www.aaaai.org 2. Food Allergy Research and Education  (FARE): foodallergy.org 3. Mothers of Asthmatics: http://www.asthmacommunitynetwork.org 4. American College of Allergy, Asthma, and Immunology: www.acaai.org   COVID-19 Vaccine Information can be found at: [place] For questions related to vaccine distribution or appointments, please email [email or web address] or call [phone].     "Like" [email or web address] on Facebook and Instagram for our latest updates!       HAPPY SPRING!  Make sure you are registered to vote! If you have moved or changed any of your contact information, you will need to get this updated before voting!  In some cases, you MAY be able to register to vote online: [phone]

## 2020-07-21 ENCOUNTER — Other Ambulatory Visit: Payer: Self-pay | Admitting: *Deleted

## 2020-07-24 ENCOUNTER — Other Ambulatory Visit: Payer: Self-pay

## 2020-07-24 MED ORDER — ALBUTEROL SULFATE HFA 108 (90 BASE) MCG/ACT IN AERS: 2.0000 | INHALATION_SPRAY | RESPIRATORY_TRACT | 1 refills | Status: DC | PRN

## 2020-08-01 ENCOUNTER — Other Ambulatory Visit: Payer: Self-pay | Admitting: *Deleted

## 2020-08-13 ENCOUNTER — Other Ambulatory Visit: Payer: Self-pay | Admitting: Allergy & Immunology

## 2020-08-16 ENCOUNTER — Other Ambulatory Visit: Payer: Self-pay

## 2020-08-16 ENCOUNTER — Other Ambulatory Visit: Payer: Self-pay | Admitting: Allergy & Immunology

## 2020-08-16 DIAGNOSIS — J3089 Other allergic rhinitis: Secondary | ICD-10-CM

## 2020-08-16 MED ORDER — ESOMEPRAZOLE MAGNESIUM 40 MG PO CPDR: 40.0000 mg | DELAYED_RELEASE_CAPSULE | Freq: Every day | ORAL | 2 refills | Status: DC

## 2020-08-16 MED ORDER — EPINEPHRINE 0.3 MG/0.3ML IJ SOAJ: 0.3000 mg | INTRAMUSCULAR | 2 refills | Status: DC | PRN

## 2020-08-16 MED ORDER — FLUTICASONE PROPIONATE 50 MCG/ACT NA SUSP: 1.0000 | Freq: Every day | NASAL | 2 refills | Status: DC

## 2020-08-16 MED ORDER — AZELASTINE HCL 0.1 % NA SOLN: 2.0000 | Freq: Two times a day (BID) | NASAL | 2 refills | Status: DC

## 2020-08-16 MED ORDER — ALBUTEROL SULFATE (2.5 MG/3ML) 0.083% IN NEBU: 2.5000 mg | INHALATION_SOLUTION | RESPIRATORY_TRACT | 3 refills | Status: DC | PRN

## 2020-08-16 MED ORDER — LEVOCETIRIZINE DIHYDROCHLORIDE 5 MG PO TABS: 5.0000 mg | ORAL_TABLET | Freq: Every day | ORAL | 2 refills | Status: DC | PRN

## 2020-08-16 MED ORDER — IPRATROPIUM BROMIDE 0.06 % NA SOLN: NASAL | 2 refills | Status: DC

## 2020-08-28 ENCOUNTER — Other Ambulatory Visit: Payer: Self-pay | Admitting: Allergy & Immunology

## 2020-09-04 ENCOUNTER — Other Ambulatory Visit: Payer: Self-pay | Admitting: Allergy & Immunology

## 2020-09-19 ENCOUNTER — Other Ambulatory Visit: Payer: Self-pay | Admitting: Internal Medicine

## 2020-09-29 ENCOUNTER — Other Ambulatory Visit: Payer: Self-pay | Admitting: Allergy & Immunology

## 2020-10-04 ENCOUNTER — Other Ambulatory Visit: Payer: Self-pay | Admitting: Allergy & Immunology

## 2020-10-04 NOTE — Telephone Encounter (Signed)
Please advise to these refills. Neither of them was listed on the last 2 avs's

## 2020-10-05 NOTE — Telephone Encounter (Signed)
That is fine to send those in.  Malachi Bonds, MD [facility]

## 2020-10-10 ENCOUNTER — Ambulatory Visit: Payer: Medicare Other | Admitting: Allergy & Immunology

## 2020-10-18 ENCOUNTER — Other Ambulatory Visit: Payer: Self-pay

## 2020-10-18 ENCOUNTER — Ambulatory Visit (HOSPITAL_COMMUNITY)
Admission: EM | Admit: 2020-10-18 | Discharge: 2020-10-18 | Disposition: A | Discharge status: Home / Self Care | Payer: Medicare Other | Attending: Family Medicine | Admitting: Family Medicine

## 2020-10-18 ENCOUNTER — Encounter (HOSPITAL_COMMUNITY): Payer: Self-pay

## 2020-10-18 DIAGNOSIS — B353 Tinea pedis: Secondary | ICD-10-CM | POA: Diagnosis not present

## 2020-10-18 DIAGNOSIS — M79671 Pain in right foot: Secondary | ICD-10-CM | POA: Diagnosis not present

## 2020-10-18 DIAGNOSIS — M79672 Pain in left foot: Secondary | ICD-10-CM | POA: Diagnosis not present

## 2020-10-18 MED ORDER — GABAPENTIN 300 MG PO CAPS: 300.0000 mg | ORAL_CAPSULE | Freq: Three times a day (TID) | ORAL | 0 refills | Status: DC | PRN

## 2020-10-18 MED ORDER — KETOCONAZOLE 2 % EX CREA: 1.0000 "application " | TOPICAL_CREAM | Freq: Two times a day (BID) | CUTANEOUS | 0 refills | Status: DC | PRN

## 2020-10-18 NOTE — ED Provider Notes (Signed)
[facility]    CSN: [id number] Arrival date & time: 10/18/20  9417      History   Chief Complaint Chief Complaint  Patient presents with  . Foot Pain    HPI [name] is a 44 y.o. female.   Here today for evaluation of acute on chronic b/l foot pain, aching and numbness/tingling. 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 Occasionally has some swelling in the legs but typically nothing significant. Denies discoloration, injury, wound, fever, chills. So far not trying anything OTC for sxs. Does improve some in severity with rest. She states she has never been told she was diabetic other than last time she was at her PCP but she states they got her and her mother's blood work mixed up so it was a mistake.      Past Medical History:  Diagnosis Date  . Asthma   . Hydradenitis   . Obesity     Patient Active Problem List   Diagnosis Date Noted  . Graves disease 07/06/2019  . Hyperthyroidism 07/06/2019  . Current smoker 05/26/2019  . Seasonal and perennial allergic rhinitis 03/25/2018  . 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 Perennial and seasonal allergic rhinitis 08/19/2017  . Moderate persistent asthma without complication 08/19/2017    Past Surgical History:  Procedure Laterality Date  . CESAREAN SECTION     x2  . TUBAL LIGATION    . WISDOM TOOTH EXTRACTION  2011    OB History   No obstetric history on file.      Home Medications    Prior to Admission medications   Medication Sig Start Date End Date Taking? Authorizing Provider  albuterol (PROVENTIL) (2.5 MG/3ML) 0.083% nebulizer solution *Inhale three ml every 4 hours as needed for shortness of breath 08/16/20   Alfonse Spruce, MD  albuterol (PROVENTIL) (2.5 MG/3ML) 0.083% nebulizer solution Take 3 mLs (2.5 mg total) by nebulization every 4 (four) hours as needed for  wheezing or shortness of breath. 08/16/20   Alfonse Spruce, MD  albuterol (PROVENTIL) (2.5 MG/3ML) 0.083% nebulizer solution Inhale 3 mls via nebulizer every 4 hours as needed for shortness of breath 09/04/20   Alfonse Spruce, MD  albuterol (VENTOLIN HFA) 108 (90 Base) MCG/ACT inhaler INHALE 2 PUFFS INTO THE LUNGS EVERY 4 HOURS AS NEEDED FOR WHEEZE OR FOR SHORTNESS OF BREATH 10/02/20   Alfonse Spruce, MD  azelastine (ASTELIN) 0.1 % nasal spray *Spray 2 sprays in each nostril two times daily 08/16/20   Alfonse Spruce, MD  azelastine (ASTELIN) 0.1 % nasal spray Place 2 sprays into both nostrils 2 (two) times daily. 08/16/20   Alfonse Spruce, MD  azelastine (ASTELIN) 0.1 % nasal spray Use 2 sprays into each nostril twice daily 09/04/20   Alfonse Spruce, MD  benzonatate (TESSALON) 100 MG capsule Take 1-2 capsules (100-200 mg total) by mouth 3 (three) times daily as needed. 11/13/19   Wallis Bamberg, PA-C  budesonide-formoterol (SYMBICORT) 160-4.5 MCG/ACT inhaler TAKE 2 PUFFS BY MOUTH TWICE A DAY 08/28/20   Alfonse Spruce, MD  doxycycline (MONODOX) 100 MG capsule Take 100 mg by mouth 2 (two) times daily.    [provider]  EPINEPHRINE 0.3 mg/0.3 mL IJ SOAJ injection *inject 0.79ml subcutaneously as needed for anaphylaxis 08/16/20   Alfonse Spruce, MD  EPINEPHrine 0.3 mg/0.3 mL IJ SOAJ  injection Inject 0.3 mg into the muscle as needed for anaphylaxis. 08/16/20   Alfonse Spruce, MD  esomeprazole (NEXIUM) 40 MG capsule *Take one capsule twice a day 08/16/20   Alfonse Spruce, MD  esomeprazole (NEXIUM) 40 MG capsule Take 1 capsule (40 mg total) by mouth daily. 08/16/20   Alfonse Spruce, MD  esomeprazole (NEXIUM) 40 MG capsule Take 1 capsule by mouth twice daily 10/04/20   Alfonse Spruce, MD  fluticasone [facility]) 50 MCG/ACT nasal spray *Spray one puff in each nostril every day 08/16/20   Alfonse Spruce, MD  fluticasone  [facility]) 50 MCG/ACT nasal spray Place 1 spray into both nostrils daily. 08/16/20   Alfonse Spruce, MD  fluticasone [facility]ana) 50 MCG/ACT nasal spray Use 1 spray into each nostril every day 09/04/20   Alfonse Spruce, MD  gabapentin (NEURONTIN) 300 MG capsule Take 1 capsule (300 mg total) by mouth 3 (three) times daily as needed. 10/18/20   Particia Nearing, PA-C  ipratropium (ATROVENT) 0.06 % nasal spray *Spray two puffs in each nostril three to four times daily 08/16/20   Alfonse Spruce, MD  ipratropium (ATROVENT) 0.06 % nasal spray PLACE 2 SPRAYS INTO BOTH NOSTRILS 4 TIMES DAILY. 3-4 TIMES/ DAY 08/16/20   Alfonse Spruce, MD  ipratropium (ATROVENT) 0.06 % nasal spray Use 2 sprays into each nostril  3 to 4 times a day 10/04/20   Alfonse Spruce, MD  ketoconazole (NIZORAL) 2 % cream Apply 1 application topically 2 (two) times daily as needed for irritation. 10/18/20   Particia Nearing, PA-C  levocetirizine (XYZAL) 5 MG tablet *Take one tablet by mouth every day as needed for allergies 08/16/20   Alfonse Spruce, MD  levocetirizine (XYZAL) 5 MG tablet Take 1 tablet (5 mg total) by mouth daily as needed for allergies. 08/16/20   Alfonse Spruce, MD  levocetirizine El[facility] Boone) 5 MG tablet Take 1 tablet by mouth every day as needed for allergies 09/04/20   Alfonse Spruce, MD  methimazole (TAPAZOLE) 10 MG tablet TAKE 1 AND 1/2 TABLETS DAILY BY MOUTH 02/04/20   Shamleffer, Konrad Dolores, MD  Spacer/Aero-Holding Chambers (AEROCHAMBER PLUS) inhaler Use as instructed 08/05/17   Domenick Gong, MD    Family History Family History  Problem Relation Age of Onset  . Diabetes Mother   . Hypertension Mother   . Diabetes Father   . Asthma Other   . Hyperlipidemia Other   . Hypertension Other     Social History Social History   Tobacco Use  . Smoking status: Current Some Day Smoker    Packs/day: 0.33    Types: Cigarettes  . Smokeless tobacco:  Never Used  Vaping Use  . Vaping Use: Never used  Substance Use Topics  . Alcohol use: Yes    Comment: once a month at a party beer wine or mixed drinks  . Drug use: No     Allergies   Shellfish allergy   Review of Systems Review of Systems PER HPI    Physical Exam Triage Vital Signs ED Triage Vitals  Enc Vitals Group     BP 10/18/20 0828 (!) 161/90     Pulse Rate 10/18/20 0828 73     Resp 10/18/20 0828 20     Temp 10/18/20 0828 98.6 F (37 C)     Temp Source 10/18/20 0828 Oral     SpO2 10/18/20 0828 98 %     Weight --  Height --      Head Circumference --      Peak Flow --      Pain Score 10/18/20 0825 9     Pain Loc --      Pain Edu? --      Excl. in GC? --    No data found.  Updated Vital Signs BP (!) 161/90 (BP Location: Left Wrist)   Pulse 73   Temp 98.6 F (37 C) (Oral)   Resp 20   LMP  (Within Weeks) Comment: 1 week  SpO2 98%   Visual Acuity Right Eye Distance:   Left Eye Distance:   Bilateral Distance:    Right Eye Near:   Left Eye Near:    Bilateral Near:     Physical Exam Vitals and nursing note reviewed.  Constitutional:      Appearance: Normal appearance. She is obese. She is not ill-appearing.  HENT:     Head: Atraumatic.  Eyes:     Extraocular Movements: Extraocular movements intact.     Conjunctiva/sclera: Conjunctivae normal.  Cardiovascular:     Rate and Rhythm: Normal rate and regular rhythm.     Pulses: Normal pulses.     Heart sounds: Normal heart sounds.  Pulmonary:     Effort: Pulmonary effort is normal.     Breath sounds: Normal breath sounds.  Musculoskeletal:        General: Tenderness (mild diffuse ttp b/l feet) present. No swelling, deformity or signs of injury. Normal range of motion.     Cervical back: Normal range of motion and neck supple.  Skin:    General: Skin is warm and dry.     Comments: Numerous areas of callus b/l feet, diffuse scaling, peeling of skin particularly around toes  Neurological:       Mental Status: She is alert and oriented to person, place, and time.     Sensory: No sensory deficit.     Motor: No weakness.  Psychiatric:        Mood and Affect: Mood normal.        Thought Content: Thought content normal.        Judgment: Judgment normal.      UC Treatments / Results  Labs (all labs ordered are listed, but only abnormal results are displayed) Labs Reviewed - No data to display  EKG   Radiology No results found.  Procedures Procedures (including critical care time)  Medications Ordered in UC Medications - No data to display  Initial Impression / Assessment and Plan / UC Course  I have reviewed the triage vital signs and the nursing notes.  Pertinent labs & imaging results that were available during my care of the patient were reviewed by me and considered in my medical decision making (see chart for details).     Suspect some element of neuropathy causing some of her sxs, though also suspect some arthritis pain from obesity causing strain to feet now that she's standing for long periods. Discussed gabapentin, epsom salt soaks, weight loss/exercise as tolerated, supportive shoes. F/u with PCP for repeat blood work and recheck - do suspect she may have blood sugar issues based on labs from 1+ years ago on review but unsure of the extent of these at this time. Will also give ketoconazole for suspected tinea pedis and discussed foot care/callus care   Final Clinical Impressions(s) / UC Diagnoses   Final diagnoses:  Foot pain, bilateral  Tinea pedis of both feet  Discharge Instructions   None    ED Prescriptions    Medication Sig Dispense Auth. Provider   gabapentin (NEURONTIN) 300 MG capsule Take 1 capsule (300 mg total) by mouth 3 (three) times daily as needed. 90 capsule Roosvelt Maser [place], [place]   ketoconazole (NIZORAL) 2 % cream Apply 1 application topically 2 (two) times daily as needed for irritation. 30 g Particia Nearing, [place]      PDMP not reviewed this encounter.   Particia Nearing, [place] 10/18/20 1119

## 2020-10-18 NOTE — ED Triage Notes (Signed)
Pt presents with bilateral pain in the foot x 3 weeks. States she fell 3 weeks ago and is having "pins and needles" pain in foot. States flexeril gives no relief.

## 2020-10-30 ENCOUNTER — Other Ambulatory Visit: Payer: Self-pay | Admitting: Allergy & Immunology

## 2020-11-01 ENCOUNTER — Other Ambulatory Visit: Payer: Self-pay

## 2020-11-01 ENCOUNTER — Other Ambulatory Visit: Payer: Self-pay | Admitting: Allergy & Immunology

## 2020-11-01 NOTE — Telephone Encounter (Signed)
Left message for pt. That we sent in a courtesy refill for atrovent 0.06% and nexium 1 time only. No more refills until you are seen. Pt. Was instructed to call the [place] office to schedule an appointment.

## 2020-11-07 NOTE — Telephone Encounter (Signed)
Left  Message on pt.'s cell phone that we sent in a courtesy refill for atrovent 0.06 % and nexium and for you to call the [place] office to schedule and office appointment with the Dr. No more refills until you are seen.

## 2020-11-17 ENCOUNTER — Emergency Department (HOSPITAL_COMMUNITY): Payer: Medicare Other

## 2020-11-17 ENCOUNTER — Encounter (HOSPITAL_COMMUNITY): Payer: Self-pay | Admitting: Emergency Medicine

## 2020-11-17 ENCOUNTER — Emergency Department (HOSPITAL_COMMUNITY)
Admission: EM | Admit: 2020-11-17 | Discharge: 2020-11-17 | Disposition: A | Discharge status: Home / Self Care | Payer: Medicare Other | Attending: Emergency Medicine | Admitting: Emergency Medicine

## 2020-11-17 DIAGNOSIS — R21 Rash and other nonspecific skin eruption: Secondary | ICD-10-CM | POA: Diagnosis not present

## 2020-11-17 DIAGNOSIS — M79671 Pain in right foot: Secondary | ICD-10-CM | POA: Diagnosis present

## 2020-11-17 DIAGNOSIS — Z7951 Long term (current) use of inhaled steroids: Secondary | ICD-10-CM | POA: Insufficient documentation

## 2020-11-17 DIAGNOSIS — M79672 Pain in left foot: Secondary | ICD-10-CM | POA: Diagnosis not present

## 2020-11-17 DIAGNOSIS — J4540 Moderate persistent asthma, uncomplicated: Secondary | ICD-10-CM | POA: Diagnosis not present

## 2020-11-17 DIAGNOSIS — F17210 Nicotine dependence, cigarettes, uncomplicated: Secondary | ICD-10-CM | POA: Insufficient documentation

## 2020-11-17 NOTE — ED Notes (Signed)
Pt c/o fungus on bilateral feet and discolored toe nail. Pt states she has been seen here multiple time for same. Pt states the rash on her left foot is getting worse

## 2020-11-17 NOTE — ED Provider Notes (Signed)
[facility]-EMERGENCY DEPT Provider Note   CSN: [id number] Arrival date & time: 11/17/20  0410     History Chief Complaint  Patient presents with  . Foot Pain    [name] is a 44 y.o. female.  The history is provided by the patient and medical records. No language interpreter was used.  Foot Pain This is a new problem. The current episode started more than 2 days ago. The problem occurs constantly. The problem has not changed since onset.Pertinent negatives include no chest pain, no abdominal pain, no headaches and no shortness of breath. The symptoms are aggravated by standing. Nothing relieves the symptoms. She has tried nothing for the symptoms. The treatment provided no relief.       Past Medical History:  Diagnosis Date  . Asthma   . Hydradenitis   . Obesity     Patient Active Problem List   Diagnosis Date Noted  . Graves disease 07/06/2019  . Hyperthyroidism 07/06/2019  . Current smoker 05/26/2019  . Seasonal and perennial allergic rhinitis 03/25/2018  . 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 Perennial and seasonal allergic rhinitis 08/19/2017  . Moderate persistent asthma without complication 08/19/2017    Past Surgical History:  Procedure Laterality Date  . CESAREAN SECTION     x2  . TUBAL LIGATION    . WISDOM TOOTH EXTRACTION  2011     OB History   No obstetric history on file.     Family History  Problem Relation Age of Onset  . Diabetes Mother   . Hypertension Mother   . Diabetes Father   . Asthma Other   . Hyperlipidemia Other   . Hypertension Other     Social History   Tobacco Use  . Smoking status: Current Some Day Smoker    Packs/day: 0.33    Types: Cigarettes  . Smokeless tobacco: Never Used  Vaping Use  . Vaping Use: Never used  Substance Use Topics  . Alcohol use: Yes    Comment: once a month at a party beer wine or mixed drinks  . Drug use: No    Home  Medications Prior to Admission medications   Medication Sig Start Date End Date Taking? Authorizing Provider  albuterol (PROVENTIL) (2.5 MG/3ML) 0.083% nebulizer solution *Inhale three ml every 4 hours as needed for shortness of breath 08/16/20   Alfonse Spruce, MD  albuterol (PROVENTIL) (2.5 MG/3ML) 0.083% nebulizer solution Take 3 mLs (2.5 mg total) by nebulization every 4 (four) hours as needed for wheezing or shortness of breath. 08/16/20   Alfonse Spruce, MD  albuterol (PROVENTIL) (2.5 MG/3ML) 0.083% nebulizer solution Inhale 3 mls via nebulizer every 4 hours as needed for shortness of breath 09/04/20   Alfonse Spruce, MD  albuterol (VENTOLIN HFA) 108 (90 Base) MCG/ACT inhaler Inhale 2 puffs by mouth every 4 hours as needed for wheezing or shortness of breath 10/30/20   Alfonse Spruce, MD  azelastine (ASTELIN) 0.1 % nasal spray *Spray 2 sprays in each nostril two times daily 08/16/20   Alfonse Spruce, MD  azelastine (ASTELIN) 0.1 % nasal spray Place 2 sprays into both nostrils 2 (two) times daily. 08/16/20   Alfonse Spruce, MD  azelastine (ASTELIN) 0.1 % nasal spray Use 2 sprays into each nostril twice daily 09/04/20   Alfonse Spruce, MD  benzonatate (TESSALON) 100 MG capsule Take 1-2 capsules (100-200 mg total) by mouth 3 (three) times daily as needed.  11/13/19   Wallis Bamberg, PA-C  budesonide-formoterol (SYMBICORT) 160-4.5 MCG/ACT inhaler TAKE 2 PUFFS BY MOUTH TWICE A DAY 08/28/20   Alfonse Spruce, MD  doxycycline (MONODOX) 100 MG capsule Take 100 mg by mouth 2 (two) times daily.    [provider]  EPINEPHRINE 0.3 mg/0.3 mL IJ SOAJ injection *inject 0.57ml subcutaneously as needed for anaphylaxis 08/16/20   Alfonse Spruce, MD  EPINEPHrine 0.3 mg/0.3 mL IJ SOAJ injection Inject 0.3 mg into the muscle as needed for anaphylaxis. 08/16/20   Alfonse Spruce, MD  esomeprazole (NEXIUM) 40 MG capsule *Take one capsule twice a day  08/16/20   Alfonse Spruce, MD  esomeprazole (NEXIUM) 40 MG capsule Take 1 capsule (40 mg total) by mouth daily. 08/16/20   Alfonse Spruce, MD  esomeprazole (NEXIUM) 40 MG capsule Take 1 capsule by mouth twice daily 10/04/20   Alfonse Spruce, MD  esomeprazole (NEXIUM) 40 MG capsule Take 1 capsule by mouth twice daily 11/01/20   Alfonse Spruce, MD  fluticasone [facility]) 50 MCG/ACT nasal spray *Spray one puff in each nostril every day 08/16/20   Alfonse Spruce, MD  fluticasone [facility]) 50 MCG/ACT nasal spray Place 1 spray into both nostrils daily. 08/16/20   Alfonse Spruce, MD  fluticasone Aleda Grana) 50 MCG/ACT nasal spray Use 1 spray into each nostril every day 09/04/20   Alfonse Spruce, MD  gabapentin (NEURONTIN) 300 MG capsule Take 1 capsule (300 mg total) by mouth 3 (three) times daily as needed. 10/18/20   Particia Nearing, PA-C  ipratropium (ATROVENT) 0.06 % nasal spray *Spray two puffs in each nostril three to four times daily 08/16/20   Alfonse Spruce, MD  ipratropium (ATROVENT) 0.06 % nasal spray PLACE 2 SPRAYS INTO BOTH NOSTRILS 4 TIMES DAILY. 3-4 TIMES/ DAY 08/16/20   Alfonse Spruce, MD  ipratropium (ATROVENT) 0.06 % nasal spray Use 2 sprays into each nostril  3 to 4 times a day 10/04/20   Alfonse Spruce, MD  ipratropium (ATROVENT) 0.06 % nasal spray Use 2 sprays into each nostril  3 to 4 times a day 11/01/20   Alfonse Spruce, MD  ketoconazole (NIZORAL) 2 % cream Apply 1 application topically 2 (two) times daily as needed for irritation. 10/18/20   Particia Nearing, PA-C  levocetirizine (XYZAL) 5 MG tablet *Take one tablet by mouth every day as needed for allergies 08/16/20   Alfonse Spruce, MD  levocetirizine (XYZAL) 5 MG tablet Take 1 tablet (5 mg total) by mouth daily as needed for allergies. 08/16/20   Alfonse Spruce, MD  levocetirizine Elita Boone) 5 MG tablet Take 1 tablet by mouth every day as needed  for allergies 09/04/20   Alfonse Spruce, MD  methimazole (TAPAZOLE) 10 MG tablet TAKE 1 AND 1/2 TABLETS DAILY BY MOUTH 02/04/20   Shamleffer, Konrad Dolores, MD  Spacer/Aero-Holding Chambers (AEROCHAMBER PLUS) inhaler Use as instructed 08/05/17   Domenick Gong, MD    Allergies    Shellfish allergy  Review of Systems   Review of Systems  Constitutional: Negative for chills, fatigue and fever.  HENT: Negative for congestion.   Respiratory: Negative for cough, chest tightness, shortness of breath and wheezing.   Cardiovascular: Negative for chest pain and palpitations.  Gastrointestinal: Negative for abdominal pain, constipation, diarrhea, nausea and vomiting.  Genitourinary: Negative for flank pain.  Musculoskeletal: Negative for back pain, neck pain and neck stiffness.  Skin: Positive for rash.  Neurological: Negative for  dizziness, light-headedness, numbness and headaches.  Psychiatric/Behavioral: Negative for agitation and confusion.  All other systems reviewed and are negative.   Physical Exam Updated Vital Signs BP 135/76 (BP Location: Left Wrist)   Pulse 71   Temp 97.8 F (36.6 C) (Oral)   Resp 18   Ht 5\' 9"  (1.753 m)   Wt (!) 147 kg   SpO2 98%   BMI 47.85 kg/m   Physical Exam Vitals and nursing note reviewed.  Constitutional:      General: She is not in acute distress.    Appearance: She is well-developed and well-nourished. She is not ill-appearing, toxic-appearing or diaphoretic.  HENT:     Head: Normocephalic and atraumatic.     Right Ear: External ear normal.     Left Ear: External ear normal.     Nose: Nose normal.     Mouth/Throat:     Mouth: Oropharynx is clear and moist.     Pharynx: No oropharyngeal exudate.  Eyes:     Extraocular Movements: EOM normal.     Conjunctiva/sclera: Conjunctivae normal.     Pupils: Pupils are equal, round, and reactive to light.  Cardiovascular:     Rate and Rhythm: Normal rate.     Pulses: Normal pulses.      Heart sounds: No murmur heard.   Pulmonary:     Effort: Pulmonary effort is normal. No respiratory distress.     Breath sounds: No stridor. No wheezing, rhonchi or rales.  Chest:     Chest wall: No tenderness.  Abdominal:     General: Abdomen is flat. There is no distension.     Tenderness: There is no abdominal tenderness. There is no right CVA tenderness, left CVA tenderness, guarding or rebound.  Musculoskeletal:     Cervical back: Normal range of motion and neck supple.     Right foot: Normal capillary refill. Tenderness present. No laceration. Normal pulse.     Left foot: Normal capillary refill. Tenderness present. No laceration. Normal pulse.       Legs:     Comments: Bilaterally bruised great toenails with no evidence of persistent subungual hematoma.  No evidence of paronychia seen on exam.  No erythema or warmth.  No drainage.  Bruising has reportedly been there for over 1 month, no visible hematoma to trephinate her drain today.  Intact sensation, strength, and pulses bilaterally.  Skin:    General: Skin is warm.     Capillary Refill: Capillary refill takes less than 2 seconds.     Findings: Rash present. No erythema.  Neurological:     Mental Status: She is alert and oriented to person, place, and time.     Sensory: No sensory deficit.     Motor: No weakness or abnormal muscle tone.     Deep Tendon Reflexes: Reflexes are normal and symmetric.  Psychiatric:        Mood and Affect: Mood normal.     ED Results / Procedures / Treatments   Labs (all labs ordered are listed, but only abnormal results are displayed) Labs Reviewed - No data to display  EKG None  Radiology DG Foot Complete Left  Result Date: 11/17/2020 CLINICAL DATA:  Patient fell about a month ago with persistent toe pain. EXAM: LEFT FOOT - COMPLETE 3+ VIEW COMPARISON:  None. FINDINGS: No evidence for an acute fracture. No subluxation or dislocation. No worrisome lytic or sclerotic osseous  abnormality. Ossification in the distal Achilles tendon suggest repetitive or chronic trauma.  IMPRESSION: No acute bony abnormality. Electronically Signed   By: Kennith Center M.D.   On: 11/17/2020 08:36   DG Foot Complete Right  Result Date: 11/17/2020 CLINICAL DATA:  Fall 1 month ago with foot pain, initial encounter EXAM: RIGHT FOOT COMPLETE - 3+ VIEW COMPARISON:  None. FINDINGS: Hallux valgus deformity is noted. No acute fracture or dislocation is noted. Tarsal degenerative changes are seen. Calcaneal spurring is noted as well. IMPRESSION: Degenerative changes without acute abnormality. Electronically Signed   By: Alcide Clever M.D.   On: 11/17/2020 08:41    Procedures Procedures (including critical care time)  Medications Ordered in ED Medications - No data to display  ED Course  I have reviewed the triage vital signs and the nursing notes.  Pertinent labs & imaging results that were available during my care of the patient were reviewed by me and considered in my medical decision making (see chart for details).    MDM Rules/Calculators/A&P                          JAINA MORIN is a 44 y.o. female with a past medical history significant for obesity, hidradenitis, asthma, Graves' disease and hypothyroidism, and fungal infection of the feet currently on an antifungal who presents with bilateral foot pain.  Patient reports that she was started on different antifungal this week from her PCP which she reports starting to work with the fungal infections on her feet.  She says that the other day she tripped bending both of her feet back causing new severe bilateral foot pain.  She reports this feels different than some of the soreness she has had before.  She says that she wore some tight shoes a month ago and bruised her toenails and toes and this has been having pain after she stands on it for a long time each day.  She reports that the gabapentin she was given did not help with any  neuropathic pain.  She otherwise denies fevers, chills congestion, cough, or any other symptoms besides foot pain.    Bilaterally on my exam, patient does have tenderness in both feet and toes.  She has intact pulses and sensation.  She has evidence of a continued but improving fungal rash and infection on the dorsum of both feet.  Patient's calves and legs are otherwise nontender.  Knees nontender.  Hips nontender.  Lungs clear chest nontender.  Back nontender, chest nontender.  Patient will get x-rays of both feet to look for fractures.  If x-rays are reassuring and there is no evidence of other abnormalities, anticipate her following up with podiatry for continued management of her bilateral foot infections that are improving as well as further management of her chronic foot pain is related to standing on her feet all day.  Anticipate reassessment after imaging  10:28 AM Patient's x-ray showed no fracture or acute bony abnormalities.  I have talked with the patient and suspect this related to edema in her legs from standing with the tight shoes.  She will use compression stockings and minimize time on her feet.  She will follow up with a podiatrist which I will give her 1 formation about.  She will continue using the antifungal which she reports is working.  I did not see evidence of osteomyelitis and I do not see evidence of acute infection with bacteria or deep in her feet.  We agreed to hold on further work-up and patient will  follow up as we discussed.  She understands extremely strict return precautions and agreed with plan of care.  Patient discharged in good condition.   Final Clinical Impression(s) / ED Diagnoses Final diagnoses:  Bilateral foot pain    Rx / DC Orders ED Discharge Orders    None      Clinical Impression: 1. Bilateral foot pain     Disposition: Discharge  Condition: Good  I have discussed the results, Dx and Tx plan with the pt(& family if present). He/she/they  expressed understanding and agree(s) with the plan. Discharge instructions discussed at great length. Strict return precautions discussed and pt &/or family have verbalized understanding of the instructions. No further questions at time of discharge.    New Prescriptions   No medications on file    Follow Up: [facility] [address] [place] [address] [phone]    Felecia Shelling, DPM [address] [place] [address] [phone]        La Dibella, Canary Brim, MD 11/17/20 [phone]

## 2020-11-17 NOTE — Discharge Instructions (Signed)
Your work-up today was consistent with bilateral foot pain likely from standing on your feet as we discussed while at work.  The fungal infection appears to be improving based on your report so please continue the outpatient antifungal treatment.  Please use compression stockings and keep them elevated.  Please follow-up with either podiatry team to help get established.  I included both local podiatry groups for you to call to get seen as soon as possible.  Please keep your feet elevated.  The x-rays did not show evidence of bony abnormalities or bone infection such as osteomyelitis.  Clinically I do not SPECT there is a deep bacterial infection in your feet at this time.  if any symptoms change or worsen, please return to the nearest emergency department.

## 2020-11-17 NOTE — ED Triage Notes (Signed)
Patient here from home reporting bilateral foot pain and itching. States that she has been seen multiple times for same.

## 2020-11-23 ENCOUNTER — Other Ambulatory Visit: Payer: Self-pay | Admitting: Allergy & Immunology

## 2020-12-04 ENCOUNTER — Ambulatory Visit (INDEPENDENT_AMBULATORY_CARE_PROVIDER_SITE_OTHER): Payer: Medicare Other | Admitting: Podiatry

## 2020-12-04 ENCOUNTER — Other Ambulatory Visit: Payer: Self-pay

## 2020-12-04 ENCOUNTER — Encounter: Payer: Self-pay | Admitting: Podiatry

## 2020-12-04 ENCOUNTER — Ambulatory Visit (INDEPENDENT_AMBULATORY_CARE_PROVIDER_SITE_OTHER): Payer: Medicare Other

## 2020-12-04 DIAGNOSIS — L84 Corns and callosities: Secondary | ICD-10-CM | POA: Diagnosis not present

## 2020-12-04 DIAGNOSIS — M2042 Other hammer toe(s) (acquired), left foot: Secondary | ICD-10-CM

## 2020-12-04 DIAGNOSIS — M79671 Pain in right foot: Secondary | ICD-10-CM

## 2020-12-04 DIAGNOSIS — M2041 Other hammer toe(s) (acquired), right foot: Secondary | ICD-10-CM | POA: Diagnosis not present

## 2020-12-04 DIAGNOSIS — M21619 Bunion of unspecified foot: Secondary | ICD-10-CM

## 2020-12-04 DIAGNOSIS — M79672 Pain in left foot: Secondary | ICD-10-CM

## 2020-12-05 NOTE — Progress Notes (Signed)
Subjective:   Patient ID: [name], female   DOB: 45 y.o.   MRN: [id number]   HPI Patient presents with painful feet in general digital deformities structural deformity and keratotic lesions that are painful.  Patient is in poor health and obese which is complicating factor and has trouble bearing weight and does work part-time   Review of Systems  All other systems reviewed and are negative.       Objective:  Physical Exam Vitals and nursing note reviewed.  Constitutional:      Appearance: She is well-developed and well-nourished.  Cardiovascular:     Pulses: Intact distal pulses.  Pulmonary:     Effort: Pulmonary effort is normal.  Musculoskeletal:        General: Normal range of motion.  Skin:    General: Skin is warm.  Neurological:     Mental Status: She is alert.     Neurovascular status was found to be intact muscle strength was found to be adequate range of motion adequate.  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  Patient has good digital perfusion and she is well oriented     Assessment:  Patient who has risk factors with obesity lesion formation and pain in general in both feet secondary to obesity and other factors     Plan:  H&P reviewed all conditions and educated her on bunions hammertoe and do not recommend current correction.  At this point I went ahead and did sterile debridement of lesions bilateral no iatrogenic bleeding and this will be done as needed and I gave advice on shoe gear usage topical medicines oral medicines as needed  X-rays indicate significant structural bunion deformity bilateral with elevated second digits of both feet

## 2021-01-10 ENCOUNTER — Other Ambulatory Visit: Payer: Self-pay | Admitting: Internal Medicine

## 2021-01-30 LAB — COMPLETE METABOLIC PANEL WITH GFR
AG Ratio: 0.8 (calc) — ABNORMAL LOW (ref 1.0–2.5)
ALT: 7 U/L (ref 6–29)
AST: 12 U/L (ref 10–30)
Albumin: 3.6 g/dL (ref 3.6–5.1)
Alkaline phosphatase (APISO): 70 U/L (ref 31–125)
BUN: 10 mg/dL (ref 7–25)
CO2: 20 mmol/L (ref 20–32)
Calcium: 8.5 mg/dL — ABNORMAL LOW (ref 8.6–10.2)
Chloride: 108 mmol/L (ref 98–110)
Creat: 0.72 mg/dL (ref 0.50–1.10)
GFR, Est African American: 118 mL/min/{1.73_m2} (ref >=60)
GFR, Est Non African American: 102 mL/min/{1.73_m2} (ref >=60)
Globulin: 4.3 g/dL (calc) — ABNORMAL HIGH (ref 1.9–3.7)
Glucose, Bld: 74 mg/dL (ref 65–99)
Potassium: 3.8 mmol/L (ref 3.5–5.3)
Sodium: 138 mmol/L (ref 135–146)
Total Bilirubin: 0.2 mg/dL (ref 0.2–1.2)
Total Protein: 7.9 g/dL (ref 6.1–8.1)

## 2021-01-30 LAB — LIPID PANEL
Cholesterol: 154 mg/dL (ref <200)
HDL: 46 mg/dL — ABNORMAL LOW (ref >=50)
LDL Cholesterol (Calc): 94 mg/dL (calc)
Non-HDL Cholesterol (Calc): 108 mg/dL (calc) (ref <130)
Total CHOL/HDL Ratio: 3.3 (calc) (ref <5.0)
Triglycerides: 47 mg/dL (ref <150)

## 2021-01-30 LAB — CBC
HCT: 31 % — ABNORMAL LOW (ref 35.0–45.0)
Hemoglobin: 8.6 g/dL — ABNORMAL LOW (ref 11.7–15.5)
MCH: 18.9 pg — ABNORMAL LOW (ref 27.0–33.0)
MCHC: 27.7 g/dL — ABNORMAL LOW (ref 32.0–36.0)
MCV: 68.3 fL — ABNORMAL LOW (ref 80.0–100.0)
Platelets: 298 10*3/uL (ref 140–400)
RBC: 4.54 10*6/uL (ref 3.80–5.10)
RDW: 18.9 % — ABNORMAL HIGH (ref 11.0–15.0)
WBC: 12.1 10*3/uL — ABNORMAL HIGH (ref 3.8–10.8)

## 2021-01-30 LAB — EARLY SJOGREN'S SYNDROME PROFILE
CARBONIC ANHYDRASE VI (CA VI) IGA ANTIBODIES: 2.5 EU/ml
CARBONIC ANHYDRASE VI (CA VI) IGG ANTIBODIES: 43.8 EU/ml — ABNORMAL HIGH
CARBONIC ANHYDRASE VI (CA VI) IGM ANTIBODIES: <1 EU/ml
PAROTID SPECIFIC PROTEIN (PSP) IGA ANTIBODIES: 34.4 EU/ml — ABNORMAL HIGH
PAROTID SPECIFIC PROTEIN (PSP) IGG ANTIBODIES: 26.6 EU/ml — ABNORMAL HIGH
PAROTID SPECIFIC PROTEIN (PSP) IGM ANTIBODIES: 2.3 EU/ml
SALIVARY PROTEIN 1 (SP 1) IGA ANTIBODIES: 19.3 EU/ml
SALIVARY PROTEIN 1 (SP 1) IGG ANTIBODIES: 87.2 EU/ml — ABNORMAL HIGH
SALIVARY PROTEIN 1 (SP 1) IGM ANTIBODIES: 6.5 EU/ml

## 2021-01-30 LAB — TIQ-MISC

## 2021-01-30 LAB — TSH: TSH: 0.48 mIU/L

## 2021-01-30 LAB — ANA: Anti Nuclear Antibody (ANA): NEGATIVE

## 2021-01-30 LAB — SEDIMENTATION RATE: Sed Rate: 53 mm/h — ABNORMAL HIGH (ref 0–20)

## 2021-01-30 LAB — EXTRA SPECIMEN

## 2021-01-30 LAB — RHEUMATOID FACTOR: Rheumatoid fact SerPl-aCnc: <14 IU/mL (ref <14)

## 2021-01-30 LAB — URIC ACID: Uric Acid, Serum: 4 mg/dL (ref 2.5–7.0)

## 2021-01-30 LAB — VITAMIN D 25 HYDROXY (VIT D DEFICIENCY, FRACTURES): Vit D, 25-Hydroxy: 12 ng/mL — ABNORMAL LOW (ref 30–100)

## 2021-02-08 ENCOUNTER — Other Ambulatory Visit: Payer: Self-pay | Admitting: *Deleted

## 2021-02-08 MED ORDER — BUDESONIDE-FORMOTEROL FUMARATE 160-4.5 MCG/ACT IN AERO: INHALATION_SPRAY | RESPIRATORY_TRACT | 0 refills | Status: DC

## 2021-04-22 ENCOUNTER — Encounter (HOSPITAL_COMMUNITY): Payer: Self-pay | Admitting: Emergency Medicine

## 2021-04-22 ENCOUNTER — Emergency Department (HOSPITAL_COMMUNITY)
Admission: EM | Admit: 2021-04-22 | Discharge: 2021-04-22 | Disposition: A | Discharge status: Home / Self Care | Payer: Medicare Other | Attending: Emergency Medicine | Admitting: Emergency Medicine

## 2021-04-22 DIAGNOSIS — F17210 Nicotine dependence, cigarettes, uncomplicated: Secondary | ICD-10-CM | POA: Diagnosis not present

## 2021-04-22 DIAGNOSIS — Z7951 Long term (current) use of inhaled steroids: Secondary | ICD-10-CM | POA: Insufficient documentation

## 2021-04-22 DIAGNOSIS — J45909 Unspecified asthma, uncomplicated: Secondary | ICD-10-CM | POA: Insufficient documentation

## 2021-04-22 DIAGNOSIS — R21 Rash and other nonspecific skin eruption: Secondary | ICD-10-CM | POA: Insufficient documentation

## 2021-04-22 HISTORY — DX: Carrier or suspected carrier of methicillin resistant Staphylococcus aureus: Z22.322

## 2021-04-22 NOTE — ED Provider Notes (Signed)
Emergency Medicine Provider Triage Evaluation Note  [name] , a 45 y.o. female  was evaluated in triage.  Pt complains of rash.  Review of Systems  Positive: Rash, itch Negative: Fever, injury  Physical Exam  BP 136/78 (BP Location: Left Arm)   Pulse 86   Temp 98.2 F (36.8 C)   Resp 18   LMP 04/22/2021   SpO2 95%  Gen:   Awake, no distress   Resp:  Normal effort  MSK:   Moves extremities without difficulty  Other:  Multiple scatter patches of scabs noted to hands, and BLE including dorsum of feet  Medical Decision Making  Medically screening exam initiated at 1:33 PM.  Appropriate orders placed.  SHYNE RESCH was informed that the remainder of the evaluation will be completed by another provider, this initial triage assessment does not replace that evaluation, and the importance of remaining in the ED until their evaluation is complete.  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  Was on abx and steroid.    Fayrene Helper, PA-C 04/22/21 1335    Terrilee Files, MD 04/23/21 [phone]

## 2021-04-22 NOTE — Discharge Instructions (Signed)
I'm concerned you may have an autoimmune rash that is not getting better because antibiotics, anti-fungals and steroid creams do not always make those rashes better.  You need to see the dermatologist so they can do a biopsy.

## 2021-04-22 NOTE — ED Provider Notes (Signed)
[facility] EMERGENCY DEPARTMENT Provider Note   CSN: [id number] Arrival date & time: 04/22/21  1313     History No chief complaint on file.   [name] is a 45 y.o. female.  Patient is a 45 year old female with a history of hidradenitis, asthma, MRSA, thyroid disease who is presenting today with a persistent rash.  The rash has been present since November and has been gradually worsening.  Initially started with a lesion on the top of her left foot but now has continued to spread.  She has areas on her hands, feet, right breast.  They are itchy and occasionally will have pustules and drain.  She has taken numerous antibiotics for these as well as steroids for up to 30 days at a time with no significant improvement in her symptoms.  She has tried antifungal creams, steroid creams without improvement.  She reports she has been on doxycycline for a large amount of her life due to the hidradenitis and that did not help either.  She did see a dermatologist earlier this year and reports they did a culture of the area that grew back MRSA but the antibiotics have not helped.  She denies systemic symptoms such as fever, malaise.  When she takes the antibiotics they cause yeast infections in her mouth and sometimes vaginal yeast as well.  She takes fluconazole every 3 days when she is on the antibiotic but that has not helped with the rash either.  The history is provided by the patient.       Past Medical History:  Diagnosis Date  . Asthma   . Hydradenitis   . MRSA (methicillin resistant staph aureus) culture positive   . Obesity     Patient Active Problem List   Diagnosis Date Noted  . Graves disease 07/06/2019  . Hyperthyroidism 07/06/2019  . Current smoker 05/26/2019  . Seasonal and perennial allergic rhinitis 03/25/2018  . 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 Perennial and seasonal allergic rhinitis 08/19/2017  . Moderate persistent  asthma without complication 08/19/2017    Past Surgical History:  Procedure Laterality Date  . CESAREAN SECTION     x2  . TUBAL LIGATION    . WISDOM TOOTH EXTRACTION  2011     OB History   No obstetric history on file.     Family History  Problem Relation Age of Onset  . Diabetes Mother   . Hypertension Mother   . Diabetes Father   . Asthma Other   . Hyperlipidemia Other   . Hypertension Other     Social History   Tobacco Use  . Smoking status: Current Some Day Smoker    Packs/day: 0.33    Types: Cigarettes  . Smokeless tobacco: Never Used  Vaping Use  . Vaping Use: Never used  Substance Use Topics  . Alcohol use: Yes    Comment: once a month at a party beer wine or mixed drinks  . Drug use: No    Home Medications Prior to Admission medications   Medication Sig Start Date End Date Taking? Authorizing Provider  albuterol (PROVENTIL) (2.5 MG/3ML) 0.083% nebulizer solution *Inhale three ml every 4 hours as needed for shortness of breath 08/16/20   Alfonse Spruce, MD  albuterol (PROVENTIL) (2.5 MG/3ML) 0.083% nebulizer solution Take 3 mLs (2.5 mg total) by nebulization every 4 (four) hours as needed for wheezing or shortness of breath. 08/16/20   Alfonse Spruce, MD  albuterol (  PROVENTIL) (2.5 MG/3ML) 0.083% nebulizer solution Inhale 3 mls via nebulizer every 4 hours as needed for shortness of breath 09/04/20   Alfonse SpruceGallagher, Joel Louis, MD  albuterol (VENTOLIN HFA) 108 (90 Base) MCG/ACT inhaler Inhale 2 puffs by mouth every 4 hours as needed for wheezing or shortness of breath 10/30/20   Alfonse SpruceGallagher, Joel Louis, MD  azelastine (ASTELIN) 0.1 % nasal spray *Spray 2 sprays in each nostril two times daily 08/16/20   Alfonse SpruceGallagher, Joel Louis, MD  azelastine (ASTELIN) 0.1 % nasal spray Place 2 sprays into both nostrils 2 (two) times daily. 08/16/20   Alfonse SpruceGallagher, Joel Louis, MD  azelastine (ASTELIN) 0.1 % nasal spray Use 2 sprays into each nostril twice daily 09/04/20    Alfonse SpruceGallagher, Joel Louis, MD  benzonatate (TESSALON) 100 MG capsule Take 1-2 capsules (100-200 mg total) by mouth 3 (three) times daily as needed. 11/13/19   Wallis BambergMani, Mario, PA-C  budesonide-formoterol (SYMBICORT) 160-4.5 MCG/ACT inhaler TAKE 2 PUFFS BY MOUTH TWICE A DAY 02/08/21   Alfonse SpruceGallagher, Joel Louis, MD  cephALEXin (KEFLEX) 500 MG capsule Take 500 mg by mouth 4 (four) times daily. 11/13/20   [provider]  clotrimazole-betamethasone (LOTRISONE) cream Apply topically. 11/13/20   [provider]  cyclobenzaprine (FLEXERIL) 10 MG tablet  09/12/20   [provider]  diclofenac (VOLTAREN) 75 MG EC tablet Take 75 mg by mouth 2 (two) times daily. 09/12/20   [provider]  doxycycline (MONODOX) 100 MG capsule Take 100 mg by mouth 2 (two) times daily.    [provider]  EPINEPHRINE 0.3 mg/0.3 mL IJ SOAJ injection *inject 0.623ml subcutaneously as needed for anaphylaxis 08/16/20   Alfonse SpruceGallagher, Joel Louis, MD  EPINEPHrine 0.3 mg/0.3 mL IJ SOAJ injection Inject 0.3 mg into the muscle as needed for anaphylaxis. 08/16/20   Alfonse SpruceGallagher, Joel Louis, MD  esomeprazole (NEXIUM) 40 MG capsule *Take one capsule twice a day 08/16/20   Alfonse SpruceGallagher, Joel Louis, MD  esomeprazole (NEXIUM) 40 MG capsule Take 1 capsule (40 mg total) by mouth daily. 08/16/20   Alfonse SpruceGallagher, Joel Louis, MD  esomeprazole (NEXIUM) 40 MG capsule Take 1 capsule by mouth twice daily 10/04/20   Alfonse SpruceGallagher, Joel Louis, MD  esomeprazole (NEXIUM) 40 MG capsule Take 1 capsule by mouth twice daily 11/01/20   Alfonse SpruceGallagher, Joel Louis, MD  fluconazole (DIFLUCAN) 150 MG tablet Take 150 mg by mouth every 3 (three) days. 11/20/20   [provider]  fluticasone Aleda Grana(FLONASE) 50 MCG/ACT nasal spray *Spray one puff in each nostril every day 08/16/20   Alfonse SpruceGallagher, Joel Louis, MD  fluticasone [facility](FLONASE) 50 MCG/ACT nasal spray Place 1 spray into both nostrils daily. 08/16/20   Alfonse SpruceGallagher, Joel Louis, MD  fluticasone Aleda Grana(FLONASE) 50 MCG/ACT  nasal spray Use 1 spray into each nostril every day 09/04/20   Alfonse SpruceGallagher, Joel Louis, MD  gabapentin (NEURONTIN) 300 MG capsule Take 1 capsule (300 mg total) by mouth 3 (three) times daily as needed. 10/18/20   Particia NearingLane, Rachel Elizabeth, PA-C  ipratropium (ATROVENT) 0.06 % nasal spray *Spray two puffs in each nostril three to four times daily 08/16/20   Alfonse SpruceGallagher, Joel Louis, MD  ipratropium (ATROVENT) 0.06 % nasal spray PLACE 2 SPRAYS INTO BOTH NOSTRILS 4 TIMES DAILY. 3-4 TIMES/ DAY 08/16/20   Alfonse SpruceGallagher, Joel Louis, MD  ipratropium (ATROVENT) 0.06 % nasal spray Use 2 sprays into each nostril  3 to 4 times a day 10/04/20   Alfonse SpruceGallagher, Joel Louis, MD  ipratropium (ATROVENT) 0.06 % nasal spray Use 2 sprays into each nostril  3  to 4 times a day 11/01/20   Alfonse Spruce, MD  ketoconazole (NIZORAL) 2 % cream Apply 1 application topically 2 (two) times daily as needed for irritation. 10/18/20   Particia Nearing, PA-C  levocetirizine (XYZAL) 5 MG tablet *Take one tablet by mouth every day as needed for allergies 08/16/20   Alfonse Spruce, MD  levocetirizine (XYZAL) 5 MG tablet Take 1 tablet (5 mg total) by mouth daily as needed for allergies. 08/16/20   Alfonse Spruce, MD  levocetirizine Elita Boone) 5 MG tablet Take 1 tablet by mouth every day as needed for allergies 09/04/20   Alfonse Spruce, MD  methimazole (TAPAZOLE) 10 MG tablet TAKE 1 AND 1/2 TABLETS DAILY BY MOUTH 02/04/20   Shamleffer, Konrad Dolores, MD  Spacer/Aero-Holding Chambers (AEROCHAMBER PLUS) inhaler Use as instructed 08/05/17   Domenick Gong, MD    Allergies    Shellfish allergy  Review of Systems   Review of Systems  All other systems reviewed and are negative.   Physical Exam Updated Vital Signs BP 136/78 (BP Location: Left Arm)   Pulse 86   Temp 98.2 F (36.8 C)   Resp 18   LMP 04/22/2021   SpO2 95%   Physical Exam Vitals and nursing note reviewed.  Constitutional:      General: She is not in  acute distress.    Appearance: Normal appearance. She is normal weight.  HENT:     Head: Normocephalic.  Eyes:     Pupils: Pupils are equal, round, and reactive to light.  Cardiovascular:     Rate and Rhythm: Normal rate.  Pulmonary:     Effort: Pulmonary effort is normal. No respiratory distress.  Musculoskeletal:        General: No swelling or tenderness.     Right lower leg: No edema.     Left lower leg: No edema.  Skin:    General: Skin is warm and dry.     Findings: Rash present.     Comments: Patient has patchy raised lesions scattered over the bilateral feet, lower extremities, hands and a large area on her right breast including her areola.  Area is excoriated, small areas of pustules on the left foot but no significant erythema or drainage.  Multiple areas in the axilla, under the breast with scarring consistent with hidradenitis  Neurological:     Mental Status: She is alert. Mental status is at baseline.  Psychiatric:        Mood and Affect: Mood normal.        Behavior: Behavior normal.     ED Results / Procedures / Treatments   Labs (all labs ordered are listed, but only abnormal results are displayed) Labs Reviewed - No data to display  EKG None  Radiology No results found.  Procedures Procedures   Medications Ordered in ED Medications - No data to display  ED Course  I have reviewed the triage vital signs and the nursing notes.  Pertinent labs & imaging results that were available during my care of the patient were reviewed by me and considered in my medical decision making (see chart for details).    MDM Rules/Calculators/A&P                          Patient presenting today with worsening rash.  She has been seen repeatedly for this rash and reports she has been on multiple antibiotics, steroids, antifungals and multiple antibiotic creams  and steroid creams without improvement.  Patient does not feel that she is being heard and has been trying to  now get a hold of the dermatologist and keeps getting a notice that she needs to call another number but nobody ever answers.  Patient does not have any evidence of cellulitis or overt bacterial infection but does have excoriated rash involving multiple areas of her body.  Looking back she saw dermatologist in 2014 at [facility] who was treating her hidradenitis but also thought she might have pyoderma gangrenosum.  She has never tried biologic medications concerned that that might be what she needs.  Discussed this with her but she would need to see a dermatologist to have a biopsy as well as receive treatment through them.  Spoke with the patient's daughter as well.  We will have TOC contact the patient to see if they can help with coordinating seeing a new doctor.  She is scheduled to see her daughter's doctor in June. Final Clinical Impression(s) / ED Diagnoses Final diagnoses:  Rash    Rx / DC Orders ED Discharge Orders    None       Gwyneth Sprout, MD 04/22/21 1556

## 2021-04-22 NOTE — ED Triage Notes (Addendum)
Pt states she has "MRSA all over".  States she was seen by Dermatologist in March and took antibiotics and cream with some improvement while taking but then it gets worse after she stops antibiotic.   Reports rash is painful and itchy.

## 2021-05-01 ENCOUNTER — Encounter: Payer: Self-pay | Admitting: Allergy & Immunology

## 2021-05-01 ENCOUNTER — Ambulatory Visit (INDEPENDENT_AMBULATORY_CARE_PROVIDER_SITE_OTHER): Payer: Medicare Other | Admitting: Allergy & Immunology

## 2021-05-01 ENCOUNTER — Other Ambulatory Visit: Payer: Self-pay

## 2021-05-01 VITALS — BP 132/80 | HR 72 | Temp 97.9°F | Resp 18 | Ht 69.0 in | Wt 313.0 lb

## 2021-05-01 DIAGNOSIS — T7800XD Anaphylactic reaction due to unspecified food, subsequent encounter: Secondary | ICD-10-CM

## 2021-05-01 DIAGNOSIS — J4540 Moderate persistent asthma, uncomplicated: Secondary | ICD-10-CM

## 2021-05-01 DIAGNOSIS — J3089 Other allergic rhinitis: Secondary | ICD-10-CM

## 2021-05-01 DIAGNOSIS — R21 Rash and other nonspecific skin eruption: Secondary | ICD-10-CM

## 2021-05-01 MED ORDER — MONTELUKAST SODIUM 10 MG PO TABS: 10.0000 mg | ORAL_TABLET | Freq: Every day | ORAL | 5 refills | Status: DC

## 2021-05-01 MED ORDER — BUDESONIDE-FORMOTEROL FUMARATE 160-4.5 MCG/ACT IN AERO: 2.0000 | INHALATION_SPRAY | Freq: Two times a day (BID) | RESPIRATORY_TRACT | 5 refills | Status: DC

## 2021-05-01 MED ORDER — ALBUTEROL SULFATE HFA 108 (90 BASE) MCG/ACT IN AERS: INHALATION_SPRAY | RESPIRATORY_TRACT | 1 refills | Status: DC

## 2021-05-01 MED ORDER — EPINEPHRINE 0.3 MG/0.3ML IJ SOAJ: 0.3000 mg | INTRAMUSCULAR | 1 refills | Status: DC | PRN

## 2021-05-01 NOTE — Progress Notes (Signed)
FOLLOW UP  Date of Service/Encounter:  05/01/21   Assessment:   Seasonal and perennial allergic rhinitis(grasses, weeds, ragweed, trees, molds, cat, dog, cockroach, and dust mite)  Moderate persistent asthma,uncomplicated  Anaphylactic shock due to food  Rash - ? psoriasis  Non-compliance   Plan/Recommendations:   1. Moderate persistent asthma, uncomplicated - Lung testing not done today.  - We are not going to make any changes.  - Daily controller medication(s): Symbicort 160/4.63mcg two puffs twice daily with spacer  - Prior to physical activity: ProAir 2 puffs 10-15 minutes before physical activity. - Rescue medications: ProAir 4 puffs every 4-6 hours as needed or albuterol nebulizer one vial puffs every 4-6 hours as needed - Asthma control goals:  * Full participation in all desired activities (may need albuterol before activity) * Albuterol use two time or less a week on average (not counting use with activity) * Cough interfering with sleep two time or less a month * Oral steroids no more than once a year * No hospitalizations  2. Anaphylactic shock due to food (shellfish) - EpiPen refilled.    3. Perennial and seasonal allergic rhinitis (grasses, weeds, ragweed, trees, molds, cat, dog, cockroach, and dust mite) - Continue with fluticasone nasal spray two sprays per nostril daily EVERY DAY (brown bottle).  - Continue with azelastine nasal spray two sprays per nostril daily AS NEEDED.  - Continue with levocetirizine 5mg  tablet once daily.  - Continue montelukast (Singulair) 10mg  daily.    4. Return in about 6 months (around 10/31/2021).  Subjective:   [name] is a 45 y.o. female presenting today for follow up of  Chief Complaint  Patient presents with  . Asthma    ACT - 5 has been having coughing and difficulty breathing since may of 2022 the whole family had Covid     [name] has a history of the following: Patient Active Problem  List   Diagnosis Date Noted  . Graves disease 07/06/2019  . Hyperthyroidism 07/06/2019  . Current smoker 05/26/2019  . Seasonal and perennial allergic rhinitis 03/25/2018  . 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 Perennial and seasonal allergic rhinitis 08/19/2017  . Moderate persistent asthma without complication 08/19/2017    History obtained from: chart review and patient.  [name] is a 45 y.o. female presenting for a sick visit.  We last saw her in May 2021.  At that time, her lung testing looked awful.  We recommended that she restart her Symbicort 2 puffs twice daily with albuterol as needed.  We refilled her EpiPen.  For her rhinitis, we restarted her Flonase and Astelin as well as Xyzal and Singulair.  She was asked to follow-up in 6 months but presents now over a year later.  Since last visit, she has had some issues, mostly with her feet. She was diagnosed with a topical Staphylococcal skin infection. She is now followed by Dr. 59 (Podiatry). She was diagnosed with diabetes at one point, but control of this did not help.  She now has a couple of topical ointment she is using.  She does see a dermatologist, but she does not have kind things to say about him.  This person is obviously outside of our EMR.  Asthma/Respiratory Symptom History: She reports that she is still on the Symbicort 2 puffs twice daily.  I am not sure where she would be getting refills for it.  She has not needed systemic steroids.  As with  previous visits, she tends to focus more on the health of her children and grandchildren and other people living in her house rather than herself.  I have told her in the past that she needs to focus on herself so that she is able to care for everyone else in her life.  Allergic Rhinitis Symptom History: She wants refills on all of her allergy medicines.  Its not clear which when she is using on a routine basis and which ones are not being used. She has not  needed antibiotics since the last visit. She was diagnosed with COVID-19 in May 2022.  Otherwise, there have been no changes to her past medical history, surgical history, family history, or social history.    Review of Systems  Constitutional: Negative.  Negative for chills, fever, malaise/fatigue and weight loss.  HENT: Negative.  Negative for congestion, ear discharge and ear pain.   Eyes: Negative for pain, discharge and redness.  Respiratory: Negative for cough, sputum production, shortness of breath and wheezing.   Cardiovascular: Negative.  Negative for chest pain and palpitations.  Gastrointestinal: Negative for abdominal pain, heartburn, nausea and vomiting.  Skin: Positive for itching and rash.  Neurological: Negative for dizziness and headaches.  Endo/Heme/Allergies: Negative for environmental allergies. Does not bruise/bleed easily.       Objective:   Blood pressure 132/80, pulse 72, temperature 97.9 F (36.6 C), resp. rate 18, height 5\' 9"  (1.753 m), weight (!) 313 lb (142 kg), last menstrual period 04/22/2021, SpO2 96 %. Body mass index is 46.22 kg/m.   Physical Exam:  Physical Exam Constitutional:      Appearance: She is well-developed. She is obese.     Comments: However, it does look like she has lost a lot of weight since last time I saw her.  HENT:     Head: Normocephalic and atraumatic.     Right Ear: Tympanic membrane, ear canal and external ear normal.     Left Ear: Tympanic membrane, ear canal and external ear normal.     Nose: No nasal deformity, septal deviation, mucosal edema or rhinorrhea.     Right Turbinates: Enlarged and swollen.     Left Turbinates: Enlarged and swollen.     Right Sinus: No maxillary sinus tenderness or frontal sinus tenderness.     Left Sinus: No maxillary sinus tenderness or frontal sinus tenderness.     Mouth/Throat:     Lips: Pink.     Mouth: Mucous membranes are moist. Mucous membranes are not pale and not dry.      Pharynx: Uvula midline.     Comments: Cobblestoning present in the posterior oropharynx. Eyes:     General:        Right eye: No discharge.        Left eye: No discharge.     Conjunctiva/sclera: Conjunctivae normal.     Right eye: Right conjunctiva is not injected. No chemosis.    Left eye: Left conjunctiva is not injected. No chemosis.    Pupils: Pupils are equal, round, and reactive to light.  Cardiovascular:     Rate and Rhythm: Normal rate and regular rhythm.     Heart sounds: Normal heart sounds.  Pulmonary:     Effort: Pulmonary effort is normal. No tachypnea, accessory muscle usage or respiratory distress.     Breath sounds: Normal breath sounds. No wheezing, rhonchi or rales.  Chest:     Chest wall: No tenderness.  Lymphadenopathy:     Cervical: No  cervical adenopathy.  Skin:    General: Skin is warm.     Capillary Refill: Capillary refill takes less than 2 seconds.     Coloration: Skin is not pale.     Findings: Rash present. No abrasion, erythema or petechiae. Rash is not papular, urticarial or vesicular.     Comments: Multiple silvery appearing lesions on her bilateral feet as well as some on her hand.  They almost look psoriatic, but there is some crusting.  There is no oozing appreciated.  Neurological:     Mental Status: She is alert.  Psychiatric:        Behavior: Behavior is cooperative.      Diagnostic studies: none       Malachi Bonds, MD  [facility]

## 2021-05-01 NOTE — Patient Instructions (Addendum)
1. Moderate persistent asthma, uncomplicated - Lung testing not done today.  - We are not going to make any changes.  - Daily controller medication(s): Symbicort 160/4.57mcg two puffs twice daily with spacer  - Prior to physical activity: ProAir 2 puffs 10-15 minutes before physical activity. - Rescue medications: ProAir 4 puffs every 4-6 hours as needed or albuterol nebulizer one vial puffs every 4-6 hours as needed - Asthma control goals:  * Full participation in all desired activities (may need albuterol before activity) * Albuterol use two time or less a week on average (not counting use with activity) * Cough interfering with sleep two time or less a month * Oral steroids no more than once a year * No hospitalizations  2. Anaphylactic shock due to food (shellfish) - EpiPen refilled.    3. Perennial and seasonal allergic rhinitis (grasses, weeds, ragweed, trees, molds, cat, dog, cockroach, and dust mite) - Continue with fluticasone nasal spray two sprays per nostril daily EVERY DAY (brown bottle).  - Continue with azelastine nasal spray two sprays per nostril daily AS NEEDED.  - Continue with levocetirizine 5mg  tablet once daily.  - Continue montelukast (Singulair) 10mg  daily.    4. Return in about 6 months (around 10/31/2021).   Please inform of any Emergency Department visits, hospitalizations, or changes in symptoms. Call 14/05/2021 before going to the ED for breathing or allergy symptoms since we might be able to fit you in for a sick visit. Feel free to contact us anytime with any questions, problems, or concerns.  It was a pleasure to see you and your family again today!  Websites that have reliable patient information: 1. American Academy of Asthma, Allergy, and Immunology: www.aaaai.org 2. Food Allergy Research and Education (FARE): foodallergy.org 3. Mothers of Asthmatics: http://www.asthmacommunitynetwork.org 4. American College of Allergy, Asthma, and Immunology:  www.acaai.org   COVID-19 Vaccine Information can be found at: [place] For questions related to vaccine distribution or appointments, please email [email or web address] or call [phone].   We realize that you might be concerned about having an allergic reaction to the COVID19 vaccines. To help with that concern, WE ARE OFFERING THE COVID19 VACCINES IN OUR OFFICE! Ask the front desk for dates!     "Like" [email or web address] on Facebook and Instagram for our latest updates!      A healthy democracy works best when [phone] participate! Make sure you are registered to vote! If you have moved or changed any of your contact information, you will need to get this updated before voting!  In some cases, you MAY be able to register to vote online: [place]

## 2021-05-11 ENCOUNTER — Ambulatory Visit: Payer: Medicare Other | Admitting: Nurse Practitioner

## 2021-06-11 ENCOUNTER — Other Ambulatory Visit: Payer: Self-pay | Admitting: Allergy & Immunology

## 2021-06-22 ENCOUNTER — Encounter: Payer: Self-pay | Admitting: Nurse Practitioner

## 2021-06-22 ENCOUNTER — Other Ambulatory Visit: Payer: Self-pay

## 2021-06-22 ENCOUNTER — Ambulatory Visit (INDEPENDENT_AMBULATORY_CARE_PROVIDER_SITE_OTHER): Payer: Medicare Other | Admitting: Nurse Practitioner

## 2021-06-22 VITALS — BP 119/63 | HR 74 | Temp 97.1°F | Ht 69.0 in | Wt 309.0 lb

## 2021-06-22 DIAGNOSIS — E0590 Thyrotoxicosis, unspecified without thyrotoxic crisis or storm: Secondary | ICD-10-CM | POA: Diagnosis not present

## 2021-06-22 DIAGNOSIS — E0500 Thyrotoxicosis with diffuse goiter without thyrotoxic crisis or storm: Secondary | ICD-10-CM

## 2021-06-22 DIAGNOSIS — Z6841 Body Mass Index (BMI) 40.0 and over, adult: Secondary | ICD-10-CM

## 2021-06-22 DIAGNOSIS — L732 Hidradenitis suppurativa: Secondary | ICD-10-CM | POA: Diagnosis not present

## 2021-06-22 DIAGNOSIS — M359 Systemic involvement of connective tissue, unspecified: Secondary | ICD-10-CM

## 2021-06-22 DIAGNOSIS — R21 Rash and other nonspecific skin eruption: Secondary | ICD-10-CM

## 2021-06-22 DIAGNOSIS — Z7689 Persons encountering health services in other specified circumstances: Secondary | ICD-10-CM | POA: Diagnosis not present

## 2021-06-22 NOTE — Patient Instructions (Signed)
Health Maintenance, Female Adopting a healthy lifestyle and getting preventive care are important in promoting health and wellness. Ask your health care provider about: The right schedule for you to have regular tests and exams. Things you can do on your own to prevent diseases and keep yourself healthy. What should I know about diet, weight, and exercise? Eat a healthy diet  Eat a diet that includes plenty of vegetables, fruits, low-fat dairy products, and lean protein. Do not eat a lot of foods that are high in solid fats, added sugars, or sodium.  Maintain a healthy weight Body mass index (BMI) is used to identify weight problems. It estimates body fat based on height and weight. Your health care provider can help determineyour BMI and help you achieve or maintain a healthy weight. Get regular exercise Get regular exercise. This is one of the most important things you can do for your health. Most adults should: Exercise for at least 150 minutes each week. The exercise should increase your heart rate and make you sweat (moderate-intensity exercise). Do strengthening exercises at least twice a week. This is in addition to the moderate-intensity exercise. Spend less time sitting. Even light physical activity can be beneficial. Watch cholesterol and blood lipids Have your blood tested for lipids and cholesterol at 45 years of age, then havethis test every 5 years. Have your cholesterol levels checked more often if: Your lipid or cholesterol levels are high. You are older than 45 years of age. You are at high risk for heart disease. What should I know about cancer screening? Depending on your health history and family history, you may need to have cancer screening at various ages. This may include screening for: Breast cancer. Cervical cancer. Colorectal cancer. Skin cancer. Lung cancer. What should I know about heart disease, diabetes, and high blood pressure? Blood pressure and heart  disease High blood pressure causes heart disease and increases the risk of stroke. This is more likely to develop in people who have high blood pressure readings, are of African descent, or are overweight. Have your blood pressure checked: Every 3-5 years if you are 18-39 years of age. Every year if you are 40 years old or older. Diabetes Have regular diabetes screenings. This checks your fasting blood sugar level. Have the screening done: Once every three years after age 40 if you are at a normal weight and have a low risk for diabetes. More often and at a younger age if you are overweight or have a high risk for diabetes. What should I know about preventing infection? Hepatitis B If you have a higher risk for hepatitis B, you should be screened for this virus. Talk with your health care provider to find out if you are at risk forhepatitis B infection. Hepatitis C Testing is recommended for: Everyone born from 1945 through 1965. Anyone with known risk factors for hepatitis C. Sexually transmitted infections (STIs) Get screened for STIs, including gonorrhea and chlamydia, if: You are sexually active and are younger than 45 years of age. You are older than 45 years of age and your health care provider tells you that you are at risk for this type of infection. Your sexual activity has changed since you were last screened, and you are at increased risk for chlamydia or gonorrhea. Ask your health care provider if you are at risk. Ask your health care provider about whether you are at high risk for HIV. Your health care provider may recommend a prescription medicine to help   prevent HIV infection. If you choose to take medicine to prevent HIV, you should first get tested for HIV. You should then be tested every 3 months for as long as you are taking the medicine. Pregnancy If you are about to stop having your period (premenopausal) and you may become pregnant, seek counseling before you get  pregnant. Take 400 to 800 micrograms (mcg) of folic acid every day if you become pregnant. Ask for birth control (contraception) if you want to prevent pregnancy. Osteoporosis and menopause Osteoporosis is a disease in which the bones lose minerals and strength with aging. This can result in bone fractures. If you are 65 years old or older, or if you are at risk for osteoporosis and fractures, ask your health care provider if you should: Be screened for bone loss. Take a calcium or vitamin D supplement to lower your risk of fractures. Be given hormone replacement therapy (HRT) to treat symptoms of menopause. Follow these instructions at home: Lifestyle Do not use any products that contain nicotine or tobacco, such as cigarettes, e-cigarettes, and chewing tobacco. If you need help quitting, ask your health care provider. Do not use street drugs. Do not share needles. Ask your health care provider for help if you need support or information about quitting drugs. Alcohol use Do not drink alcohol if: Your health care provider tells you not to drink. You are pregnant, may be pregnant, or are planning to become pregnant. If you drink alcohol: Limit how much you use to 0-1 drink a day. Limit intake if you are breastfeeding. Be aware of how much alcohol is in your drink. In the [place] one drink equals one 12 oz bottle of beer (355 mL), one 5 oz glass of wine (148 mL), or one 1 oz glass of hard liquor (44 mL). General instructions Schedule regular health, dental, and eye exams. Stay current with your vaccines. Tell your health care provider if: You often feel depressed. You have ever been abused or do not feel safe at home. Summary Adopting a healthy lifestyle and getting preventive care are important in promoting health and wellness. Follow your health care provider's instructions about healthy diet, exercising, and getting tested or screened for diseases. Follow your health care provider's  instructions on monitoring your cholesterol and blood pressure. This information is not intended to replace advice given to you by your health care provider. Make sure you discuss any questions you have with your healthcare provider. Document Revised: 11/04/2018 Document Reviewed: 11/04/2018 Elsevier Patient Education  2022 [employer]vier Inc.  

## 2021-06-22 NOTE — Progress Notes (Signed)
[facility] [address] [place], [place]  [address] Phone:  [phone]   Fax:  [phone]   New Patient Office Visit  Subjective:  Patient ID: [name], female    DOB: 15-Mar-1976  Age: 45 y.o. MRN: [id number]  CC:  Chief Complaint  Patient presents with   Establish Care    history of hidradenitis, asthma, MRSA, thyroid disease    HPI [name] presents to establish care. She  has a past medical history of Asthma, Hydradenitis, MRSA (methicillin resistant staph aureus) culture positive, and Obesity.   Rash She is on medications as noted and overall her rash has worsened slightly. Her rash affects  ankles and feet  small amount to hand. She has been experiencing itching. Overall, symptoms have been moderate. She was seen by dermatology  and placed on several treatments. She reports prednisone offered no improvement in her ankles and feet. She reports discontinuing all medications currently on Augmentin . The right ear and face race went away. She reports that she has a job at [employer] and this was when this started. She starched her foot on the refrigerator at work.  She has been on Augmentin since December. She is been on doxycycline for the hidradenitis which is on her breast, underarms and torso. She reports that the hidradenitis is ongoing. She reports that her father had hidradenitis. She has been treated in [place] in the past. She is needs to follow up with dermatology. She is in need of a biopsy. However this has not been completed.  She does use prednisone with her asthma flare. She feels like her asthma is controlled for now but the heat does cause a problems  Her PCP completed additional labs on February however she was not provided with the results . She reports her daughter made this apt.   She denies a history of diabetes. This diagnoses was due to a mix up of her blood with her mother.    Past Medical History:  Diagnosis  Date   Asthma    Hydradenitis    MRSA (methicillin resistant staph aureus) culture positive    Obesity     Past Surgical History:  Procedure Laterality Date   CESAREAN SECTION     x2   TUBAL LIGATION     WISDOM TOOTH EXTRACTION  2011    Family History  Problem Relation Age of Onset   Diabetes Mother    Hypertension Mother    Diabetes Father    Asthma Other    Hyperlipidemia Other    Hypertension Other     Social History   Socioeconomic History   Marital status: Single    Spouse name: Not on file   Number of children: Not on file   Years of education: Not on file   Highest education level: Not on file  Occupational History   Not on file  Tobacco Use   Smoking status: Some Days    Packs/day: 0.33    Types: Cigarettes   Smokeless tobacco: Never  Vaping Use   Vaping Use: Never used  Substance and Sexual Activity   Alcohol use: Yes    Comment: once a month at a party beer wine or mixed drinks   Drug use: No   Sexual activity: Yes    Birth control/protection: None  Other Topics Concern   Not on file  Social History Narrative   Not on file   Social Determinants of Health  Financial Resource Strain: Not on file  Food Insecurity: Not on file  Transportation Needs: Not on file  Physical Activity: Not on file  Stress: Not on file  Social Connections: Not on file  Intimate Partner Violence: Not on file    ROS Review of Systems  Objective:   Today's Vitals: BP 119/63 (BP Location: Right Arm, Patient Position: Sitting)   Pulse 74   Temp (!) 97.1 F (36.2 C)   Ht 5\' 9"  (1.753 m)   Wt (!) 309 lb 0.6 oz (140.2 kg)   LMP 06/06/2021   SpO2 90%   BMI 45.64 kg/m   Physical Exam Constitutional:      Appearance: She is obese.  HENT:     Head: Normocephalic.  Cardiovascular:     Rate and Rhythm: Normal rate and regular rhythm.     Pulses: Normal pulses.     Heart sounds: Normal heart sounds.  Pulmonary:     Effort: Pulmonary effort is normal.      Breath sounds: Decreased air movement present. Decreased breath sounds present.  Chest:     Comments: Right breast  Skin:    General: Skin is warm and dry.     Capillary Refill: Capillary refill takes less than 2 seconds.     Findings: Lesion and rash present. Rash is crusting and scaling.     Comments: Scaly rash to ankles and feet  Neurological:     General: No focal deficit present.    Assessment & Plan:   Problem List Items Addressed This Visit       Endocrine   Graves disease Needs new referral Labs on hold today   Relevant Orders   Ambulatory referral to Endocrinology   Hyperthyroidism Needs new referral  Labs on hold today   Relevant Orders   Ambulatory referral to Endocrinology   Other Visit Diagnoses     Encounter to establish care    -  Primary Discussed female health maintenance; SBE, annual CBE, PAP test Discussed general safety in vehicle and COVID Discussed regular hydration with water Discussed healthy diet and exercise and weight management Discussed sexual health  Discussed mental health Encouraged to call our office for an appointment with in ongoing concerns for questions.     Relevant Orders   Urinalysis Dipstick   Hidradenitis     Persistent  Continue to follow up with dermatology If new referral desired will refer   Class 3 severe obesity due to excess calories with body mass index (BMI) of 45.0 to 49.9 in adult, unspecified whether serious comorbidity present (HCC)       Scaly patch rash     Nonhealing maybe related to autoimmune disorder due to asthma and non healing rashes; referral to rheumatology for full work up. Labs on hold for this reason   Relevant Orders   Ambulatory referral to Rheumatology   Autoimmune disease [facility])     Evaluation pending   Relevant Orders   Ambulatory referral to Rheumatology       Outpatient Encounter Medications as of 06/22/2021  Medication Sig   albuterol (PROAIR HFA) 108 (90 Base) MCG/ACT inhaler  Inhale 4 puffs every 4-6 hours as needed   albuterol (PROVENTIL) (2.5 MG/3ML) 0.083% nebulizer solution Take 3 mLs (2.5 mg total) by nebulization every 4 (four) hours as needed for wheezing or shortness of breath.   albuterol (VENTOLIN HFA) 108 (90 Base) MCG/ACT inhaler Inhale 2 puffs by mouth every 4 hours as needed for wheezing or shortness  of breath   azelastine (ASTELIN) 0.1 % nasal spray Use 2 sprays into each nostril twice daily   budesonide-formoterol (SYMBICORT) 160-4.5 MCG/ACT inhaler TAKE 2 PUFFS BY MOUTH TWICE A DAY   doxycycline (MONODOX) 100 MG capsule Take 100 mg by mouth 2 (two) times daily.   EPINEPHRINE 0.3 mg/0.3 mL IJ SOAJ injection Inject 0.463ml subcutaneously as needed for anaphylaxis   esomeprazole (NEXIUM) 40 MG capsule Take 1 capsule by mouth twice daily   fluticasone (FLONASE) 50 MCG/ACT nasal spray Use 1 spray into each nostril every day   levocetirizine (XYZAL) 5 MG tablet Take 1 tablet by mouth every day as needed for allergies   methimazole (TAPAZOLE) 10 MG tablet TAKE 1 AND 1/2 TABLETS DAILY BY MOUTH   montelukast (SINGULAIR) 10 MG tablet Take 1 tablet (10 mg total) by mouth at bedtime.   Spacer/Aero-Holding Chambers (AEROCHAMBER PLUS) inhaler Use as instructed   [DISCONTINUED] benzonatate (TESSALON) 100 MG capsule Take 1-2 capsules (100-200 mg total) by mouth 3 (three) times daily as needed.   [DISCONTINUED] EPINEPHrine 0.3 mg/0.3 mL IJ SOAJ injection Inject 0.3 mg into the muscle as needed for anaphylaxis.   ampicillin (PRINCIPEN) 500 MG capsule Take 500 mg by mouth 2 (two) times daily.   augmented betamethasone dipropionate (DIPROLENE-AF) 0.05 % ointment Apply topically 2 (two) times daily as needed.   guaiFENesin-codeine 100-10 MG/5ML syrup Take 10 mLs by mouth 2 (two) times daily as needed.   mupirocin ointment (BACTROBAN) 2 % Apply 1 application topically 2 (two) times daily.   predniSONE (DELTASONE) 10 MG tablet Take by mouth.   [DISCONTINUED] albuterol  (PROVENTIL) (2.5 MG/3ML) 0.083% nebulizer solution *Inhale three ml every 4 hours as needed for shortness of breath   [DISCONTINUED] albuterol (PROVENTIL) (2.5 MG/3ML) 0.083% nebulizer solution Inhale 3 mls via nebulizer every 4 hours as needed for shortness of breath   [DISCONTINUED] budesonide-formoterol (SYMBICORT) 160-4.5 MCG/ACT inhaler Inhale 2 puffs into the lungs 2 (two) times daily.   [DISCONTINUED] cephALEXin (KEFLEX) 500 MG capsule Take 500 mg by mouth 4 (four) times daily.   [DISCONTINUED] clotrimazole-betamethasone (LOTRISONE) cream Apply topically.   [DISCONTINUED] cyclobenzaprine (FLEXERIL) 10 MG tablet    [DISCONTINUED] diclofenac (VOLTAREN) 75 MG EC tablet Take 75 mg by mouth 2 (two) times daily.   [DISCONTINUED] EPINEPHrine 0.3 mg/0.3 mL IJ SOAJ injection Inject 0.3 mg into the muscle as needed for anaphylaxis.   [DISCONTINUED] esomeprazole (NEXIUM) 40 MG capsule *Take one capsule twice a day   [DISCONTINUED] esomeprazole (NEXIUM) 40 MG capsule Take 1 capsule (40 mg total) by mouth daily.   [DISCONTINUED] esomeprazole (NEXIUM) 40 MG capsule Take 1 capsule by mouth twice daily   [DISCONTINUED] fluconazole (DIFLUCAN) 150 MG tablet Take 150 mg by mouth every 3 (three) days.   [DISCONTINUED] gabapentin (NEURONTIN) 300 MG capsule Take 1 capsule (300 mg total) by mouth 3 (three) times daily as needed.   [DISCONTINUED] ipratropium (ATROVENT) 0.06 % nasal spray *Spray two puffs in each nostril three to four times daily   [DISCONTINUED] ipratropium (ATROVENT) 0.06 % nasal spray PLACE 2 SPRAYS INTO BOTH NOSTRILS 4 TIMES DAILY. 3-4 TIMES/ DAY   [DISCONTINUED] ipratropium (ATROVENT) 0.06 % nasal spray Use 2 sprays into each nostril  3 to 4 times a day   [DISCONTINUED] ipratropium (ATROVENT) 0.06 % nasal spray Use 2 sprays into each nostril  3 to 4 times a day   [DISCONTINUED] ketoconazole (NIZORAL) 2 % cream Apply 1 application topically 2 (two) times daily as needed for irritation.  No  facility-administered encounter medications on file as of 06/22/2021.    Follow-up: Return in about 3 months (around 09/22/2021).   Barbette Merino, NP

## 2021-06-24 ENCOUNTER — Encounter: Payer: Self-pay | Admitting: Nurse Practitioner

## 2021-07-06 ENCOUNTER — Other Ambulatory Visit: Payer: Self-pay | Admitting: Allergy & Immunology

## 2021-08-05 ENCOUNTER — Other Ambulatory Visit: Payer: Self-pay | Admitting: Allergy & Immunology

## 2021-08-24 ENCOUNTER — Other Ambulatory Visit: Payer: Self-pay | Admitting: Internal Medicine

## 2021-08-24 ENCOUNTER — Other Ambulatory Visit: Payer: Self-pay | Admitting: Family Medicine

## 2021-09-02 ENCOUNTER — Other Ambulatory Visit: Payer: Self-pay | Admitting: Allergy & Immunology

## 2021-09-05 ENCOUNTER — Other Ambulatory Visit: Payer: Self-pay

## 2021-09-19 ENCOUNTER — Other Ambulatory Visit: Payer: Self-pay | Admitting: *Deleted

## 2021-09-24 ENCOUNTER — Ambulatory Visit: Payer: Medicare Other | Admitting: Nurse Practitioner

## 2021-09-26 ENCOUNTER — Ambulatory Visit: Payer: Medicare Other | Admitting: Nurse Practitioner

## 2021-10-02 ENCOUNTER — Other Ambulatory Visit: Payer: Self-pay | Admitting: Allergy & Immunology

## 2021-10-11 ENCOUNTER — Telehealth: Payer: Self-pay

## 2021-10-11 MED ORDER — EPINEPHRINE 0.3 MG/0.3ML IJ SOAJ: 0.3000 mg | INTRAMUSCULAR | 0 refills | Status: DC | PRN

## 2021-10-11 MED ORDER — ALBUTEROL SULFATE (2.5 MG/3ML) 0.083% IN NEBU: 2.5000 mg | INHALATION_SOLUTION | RESPIRATORY_TRACT | 0 refills | Status: DC | PRN

## 2021-10-11 NOTE — Telephone Encounter (Signed)
Zach from [facility] pharmacy called and asked for a refill on the albuterol nebulizer solution and the epi pen. I have sent in the refills and gave a verbal over the phone. Called and left a message for patient to inform her that the refills have been sen tin and she also needs an appointment for a follow up with Dr. Dellis Anes.

## 2021-10-15 ENCOUNTER — Other Ambulatory Visit: Payer: Self-pay | Admitting: *Deleted

## 2021-11-12 ENCOUNTER — Other Ambulatory Visit: Payer: Self-pay | Admitting: *Deleted

## 2021-11-12 MED ORDER — AZELASTINE HCL 137 MCG/SPRAY NA SOLN: NASAL | 5 refills | Status: DC

## 2021-11-12 MED ORDER — LEVOCETIRIZINE DIHYDROCHLORIDE 5 MG PO TABS: ORAL_TABLET | ORAL | 5 refills | Status: DC

## 2021-11-18 ENCOUNTER — Other Ambulatory Visit: Payer: Self-pay | Admitting: Allergy & Immunology

## 2021-12-16 ENCOUNTER — Other Ambulatory Visit: Payer: Self-pay | Admitting: Allergy & Immunology

## 2021-12-23 ENCOUNTER — Encounter (HOSPITAL_COMMUNITY): Payer: Self-pay | Admitting: *Deleted

## 2021-12-23 ENCOUNTER — Other Ambulatory Visit: Payer: Self-pay

## 2021-12-23 ENCOUNTER — Ambulatory Visit (HOSPITAL_COMMUNITY)
Admission: EM | Admit: 2021-12-23 | Discharge: 2021-12-23 | Disposition: A | Discharge status: Home / Self Care | Payer: Medicare Other | Attending: Family Medicine | Admitting: Family Medicine

## 2021-12-23 DIAGNOSIS — M542 Cervicalgia: Secondary | ICD-10-CM

## 2021-12-23 DIAGNOSIS — M545 Low back pain, unspecified: Secondary | ICD-10-CM

## 2021-12-23 MED ORDER — KETOROLAC TROMETHAMINE 30 MG/ML IJ SOLN
INTRAMUSCULAR | Status: AC
  Filled 2021-12-23: qty 1

## 2021-12-23 MED ORDER — TIZANIDINE HCL 4 MG PO TABS: 4.0000 mg | ORAL_TABLET | Freq: Three times a day (TID) | ORAL | 0 refills | Status: DC | PRN

## 2021-12-23 MED ORDER — IBUPROFEN 800 MG PO TABS: 800.0000 mg | ORAL_TABLET | Freq: Three times a day (TID) | ORAL | 0 refills | Status: DC | PRN

## 2021-12-23 MED ORDER — KETOROLAC TROMETHAMINE 30 MG/ML IJ SOLN
30.0000 mg | Freq: Once | INTRAMUSCULAR | Status: AC
  Administered 2021-12-23: 30 mg via INTRAMUSCULAR
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
## 2021-12-23 NOTE — Discharge Instructions (Addendum)
Ibuprofen 800 mg 1 every 8 hours as needed for pain.  Take tizanidine 4 mg 1 every 8 hours as needed for muscle pain.  This medication can make you sleepy.  You have been given an injection of Toradol 30 mg for pain today  You can use warm compress or heating pad on the sore areas

## 2022-01-13 ENCOUNTER — Other Ambulatory Visit: Payer: Self-pay | Admitting: Allergy & Immunology

## 2022-03-04 ENCOUNTER — Other Ambulatory Visit: Payer: Self-pay | Admitting: *Deleted

## 2022-03-04 MED ORDER — FLUTICASONE PROPIONATE 50 MCG/ACT NA SUSP: NASAL | 5 refills | Status: DC

## 2022-03-04 MED ORDER — ESOMEPRAZOLE MAGNESIUM 40 MG PO CPDR: 40.0000 mg | DELAYED_RELEASE_CAPSULE | Freq: Two times a day (BID) | ORAL | 5 refills | Status: DC

## 2022-03-29 ENCOUNTER — Other Ambulatory Visit: Payer: Self-pay | Admitting: Internal Medicine

## 2022-03-29 ENCOUNTER — Other Ambulatory Visit: Payer: Self-pay | Admitting: Allergy & Immunology

## 2022-04-15 ENCOUNTER — Other Ambulatory Visit: Payer: Self-pay | Admitting: Internal Medicine

## 2022-04-16 LAB — CBC
HCT: 29 % — ABNORMAL LOW (ref 35.0–45.0)
Hemoglobin: 7.5 g/dL — ABNORMAL LOW (ref 11.7–15.5)
MCH: 17 pg — ABNORMAL LOW (ref 27.0–33.0)
MCHC: 25.9 g/dL — ABNORMAL LOW (ref 32.0–36.0)
MCV: 65.6 fL — ABNORMAL LOW (ref 80.0–100.0)
Platelets: 282 10*3/uL (ref 140–400)
RBC: 4.42 10*6/uL (ref 3.80–5.10)
RDW: 18.9 % — ABNORMAL HIGH (ref 11.0–15.0)
WBC: 10.3 10*3/uL (ref 3.8–10.8)

## 2022-04-16 LAB — COMPLETE METABOLIC PANEL WITH GFR
AG Ratio: 0.7 (calc) — ABNORMAL LOW (ref 1.0–2.5)
ALT: 3 U/L — ABNORMAL LOW (ref 6–29)
AST: 7 U/L — ABNORMAL LOW (ref 10–35)
Albumin: 3.5 g/dL — ABNORMAL LOW (ref 3.6–5.1)
Alkaline phosphatase (APISO): 65 U/L (ref 31–125)
BUN: 8 mg/dL (ref 7–25)
CO2: 21 mmol/L (ref 20–32)
Calcium: 8.9 mg/dL (ref 8.6–10.2)
Chloride: 105 mmol/L (ref 98–110)
Creat: 0.5 mg/dL (ref 0.50–0.99)
Globulin: 4.7 g/dL (calc) — ABNORMAL HIGH (ref 1.9–3.7)
Glucose, Bld: 84 mg/dL (ref 65–99)
Potassium: 4 mmol/L (ref 3.5–5.3)
Sodium: 138 mmol/L (ref 135–146)
Total Bilirubin: 0.4 mg/dL (ref 0.2–1.2)
Total Protein: 8.2 g/dL — ABNORMAL HIGH (ref 6.1–8.1)
eGFR: 118 mL/min/{1.73_m2} (ref >=60)

## 2022-04-16 LAB — TSH: TSH: 0.96 mIU/L

## 2022-04-16 LAB — VITAMIN D 25 HYDROXY (VIT D DEFICIENCY, FRACTURES): Vit D, 25-Hydroxy: 15 ng/mL — ABNORMAL LOW (ref 30–100)

## 2022-04-16 LAB — LIPID PANEL
Cholesterol: 152 mg/dL (ref <200)
HDL: 40 mg/dL — ABNORMAL LOW (ref >=50)
LDL Cholesterol (Calc): 97 mg/dL (calc)
Non-HDL Cholesterol (Calc): 112 mg/dL (calc) (ref <130)
Total CHOL/HDL Ratio: 3.8 (calc) (ref <5.0)
Triglycerides: 63 mg/dL (ref <150)

## 2022-04-16 LAB — T4, FREE: Free T4: 1 ng/dL (ref 0.8–1.8)

## 2022-04-16 LAB — T3: T3, Total: 97 ng/dL (ref 76–181)

## 2022-04-16 LAB — T3 UPTAKE: T3 Uptake: 31 % (ref 22–35)

## 2022-05-24 ENCOUNTER — Other Ambulatory Visit: Payer: Self-pay | Admitting: Allergy & Immunology

## 2022-06-19 ENCOUNTER — Other Ambulatory Visit: Payer: Self-pay | Admitting: Internal Medicine

## 2022-06-20 LAB — IRON, TOTAL/TOTAL IRON BINDING CAP
%SAT: 3 % (calc) — ABNORMAL LOW (ref 16–45)
Iron: 12 ug/dL — ABNORMAL LOW (ref 40–190)
TIBC: 383 mcg/dL (calc) (ref 250–450)

## 2022-06-20 LAB — T4
Free Thyroxine Index: 1.7 (ref 1.4–3.8)
T4, Total: 5.8 ug/dL (ref 5.1–11.9)

## 2022-06-20 LAB — CBC
HCT: 30.7 % — ABNORMAL LOW (ref 35.0–45.0)
Hemoglobin: 8.1 g/dL — ABNORMAL LOW (ref 11.7–15.5)
MCH: 17.6 pg — ABNORMAL LOW (ref 27.0–33.0)
MCHC: 26.4 g/dL — ABNORMAL LOW (ref 32.0–36.0)
MCV: 66.7 fL — ABNORMAL LOW (ref 80.0–100.0)
Platelets: 329 10*3/uL (ref 140–400)
RBC: 4.6 10*6/uL (ref 3.80–5.10)
RDW: 19.9 % — ABNORMAL HIGH (ref 11.0–15.0)
WBC: 9.5 10*3/uL (ref 3.8–10.8)

## 2022-06-20 LAB — FERRITIN: Ferritin: 3 ng/mL — ABNORMAL LOW (ref 16–232)

## 2022-06-20 LAB — VITAMIN B12: Vitamin B-12: 320 pg/mL (ref 200–1100)

## 2022-06-20 LAB — T3: T3, Total: 105 ng/dL (ref 76–181)

## 2022-06-20 LAB — SICKLE CELL SCREEN: Sickle Solubility Test - HGBRFX: NEGATIVE

## 2022-06-20 LAB — T3 UPTAKE: T3 Uptake: 30 % (ref 22–35)

## 2022-06-20 LAB — TSH: TSH: 0.63 mIU/L

## 2022-06-20 LAB — FOLATE: Folate: 9.7 ng/mL

## 2022-06-23 ENCOUNTER — Other Ambulatory Visit: Payer: Self-pay | Admitting: Allergy & Immunology

## 2022-08-16 ENCOUNTER — Other Ambulatory Visit: Payer: Self-pay | Admitting: Allergy & Immunology

## 2022-09-15 ENCOUNTER — Other Ambulatory Visit: Payer: Self-pay | Admitting: Allergy & Immunology

## 2022-09-27 ENCOUNTER — Ambulatory Visit: Payer: Medicare Other | Admitting: Obstetrics and Gynecology

## 2022-11-05 ENCOUNTER — Other Ambulatory Visit: Payer: Self-pay | Admitting: Allergy & Immunology

## 2022-11-08 ENCOUNTER — Telehealth: Payer: Self-pay | Admitting: Allergy & Immunology

## 2022-11-08 ENCOUNTER — Other Ambulatory Visit: Payer: Self-pay | Admitting: *Deleted

## 2022-11-08 MED ORDER — ESOMEPRAZOLE MAGNESIUM 40 MG PO CPDR: 40.0000 mg | DELAYED_RELEASE_CAPSULE | Freq: Two times a day (BID) | ORAL | 0 refills | Status: DC

## 2022-11-08 MED ORDER — LEVOCETIRIZINE DIHYDROCHLORIDE 5 MG PO TABS: ORAL_TABLET | ORAL | 0 refills | Status: DC

## 2022-11-08 NOTE — Telephone Encounter (Signed)
Pharmacy called to see if our office could authorize courtesy refills for levocetirizine and omeprazole.   Patient informed pharmacy that she had spoken to someone in our office about getting the refills and was told to schedule an appointment. Patient scheduled the appointment and was told that the courtesy refills would be sent in.   Pharmacy is requesting for courtesy refills to be sent in so they may ship them out to patient, as patient is scheduled for 11/14/2022.   Confirmed phone number for patient: [phone]

## 2022-11-08 NOTE — Telephone Encounter (Signed)
Courtesy refills have been sent in. Called and informed the patient, patient verbalized understanding.

## 2022-11-14 ENCOUNTER — Other Ambulatory Visit: Payer: Self-pay

## 2022-11-14 ENCOUNTER — Encounter: Payer: Self-pay | Admitting: Allergy & Immunology

## 2022-11-14 ENCOUNTER — Ambulatory Visit (INDEPENDENT_AMBULATORY_CARE_PROVIDER_SITE_OTHER): Payer: Medicare Other | Admitting: Allergy & Immunology

## 2022-11-14 VITALS — BP 120/80 | HR 86 | Temp 98.0°F | Resp 20 | Ht 68.5 in | Wt 286.9 lb

## 2022-11-14 DIAGNOSIS — J3089 Other allergic rhinitis: Secondary | ICD-10-CM

## 2022-11-14 DIAGNOSIS — L732 Hidradenitis suppurativa: Secondary | ICD-10-CM

## 2022-11-14 DIAGNOSIS — J4540 Moderate persistent asthma, uncomplicated: Secondary | ICD-10-CM | POA: Diagnosis not present

## 2022-11-14 DIAGNOSIS — T7800XD Anaphylactic reaction due to unspecified food, subsequent encounter: Secondary | ICD-10-CM

## 2022-11-14 MED ORDER — TRELEGY ELLIPTA 200-62.5-25 MCG/ACT IN AEPB: 1.0000 | INHALATION_SPRAY | Freq: Every day | RESPIRATORY_TRACT | 5 refills | Status: DC

## 2022-11-14 MED ORDER — FLUCONAZOLE 150 MG PO TABS: 150.0000 mg | ORAL_TABLET | Freq: Once | ORAL | 0 refills | Status: AC

## 2022-11-14 MED ORDER — FLUTICASONE PROPIONATE 50 MCG/ACT NA SUSP: NASAL | 5 refills | Status: DC

## 2022-11-14 MED ORDER — LEVOCETIRIZINE DIHYDROCHLORIDE 5 MG PO TABS: ORAL_TABLET | ORAL | 0 refills | Status: DC

## 2022-11-14 MED ORDER — CLINDAMYCIN HCL 150 MG PO CAPS: 150.0000 mg | ORAL_CAPSULE | Freq: Three times a day (TID) | ORAL | 0 refills | Status: AC

## 2022-11-14 MED ORDER — MONTELUKAST SODIUM 10 MG PO TABS: 10.0000 mg | ORAL_TABLET | Freq: Every day | ORAL | 5 refills | Status: DC

## 2022-11-14 NOTE — Progress Notes (Signed)
FOLLOW UP  Date of Service/Encounter:  11/14/22   Assessment:   Seasonal and perennial allergic rhinitis (grasses, weeds, ragweed, trees, molds, cat, dog, cockroach, and dust mite)   Moderate persistent asthma, uncomplicated   Anaphylactic shock due to food (shellfish)   Hidradenitis suppurativa    Mastitis with possible abscess - starting clindamycin TID x 1 week to clear this up (strong suspicion for MRSA given the purulence)   Plan/Recommendations:   1. Moderate persistent asthma, uncomplicated - Lung testing not done today.  - We are going to change you to Trelegy (contains three medications to help you breathe better).  - This is easier to administer since this is it once daily. - Daily controller medication(s): Trelegy 200/62.5/25 one puff once daily - Prior to physical activity: albuterol 2 puffs 10-15 minutes before physical activity. - Rescue medications: albuterol 4 puffs every 4-6 hours as needed - Asthma control goals:  * Full participation in all desired activities (may need albuterol before activity) * Albuterol use two time or less a week on average (not counting use with activity) * Cough interfering with sleep two time or less a month * Oral steroids no more than once a year * No hospitalizations  2. Anaphylactic shock due to food (shellfish) - EpiPen refilled.    3. Perennial and seasonal allergic rhinitis (grasses, weeds, ragweed, trees, molds, cat, dog, cockroach, and dust mite) - Continue with fluticasone nasal spray two sprays per nostril daily EVERY DAY (brown bottle).  - Continue with levocetirizine 5mg  tablet once daily.  - Continue montelukast (Singulair) 10mg  daily.    4. Hidradenitis suppurativa  - We are going to refer you to see Dr. , who follows another patient of mine with it.  - She is at [facility].  5. Return in about 6 months (around 10/31/2021).   Subjective:   [name] is a 46 y.o. female  presenting today for follow up of  Chief Complaint  Patient presents with   Medication Refill   Follow-up   Asthma   Rash   Allergic Reaction    Pt c/o of rash on both feet on both breast and on both hands x 1 year sites itch and are painful.   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  [name] is a 46 y.o. female presenting for a follow up visit.  She was last seen in June 2022.  At that time, we continued with Symbicort 160 mcg 2 puffs twice daily as well as albuterol as needed.  She continue to avoid shellfish.  For her environmental allergies, we continued Flonase as well as Astelin and levocetirizine as well as Singulair.  Since last visit, she has done well from an atopic perspective.   Asthma/Respiratory Symptom History: Breathing is under fair control. She  is having a problem with SOB with exercise. She has been out of her Symbicort for a long time and has tried to use her daughter's medication, but her daughter has told her that she needs to get in to  see [place] instead. She has not needed prednisone in quite some time.    Allergic Rhinitis Symptom History: She has Flonase but does not use this consistently. She has levocetirizine that she does use. She has not been on antibiotics at all for her symptoms.   Food Allergy Symptom History: She has been avoiding shellfish. She has not had any accidental infections at all for her symptoms. She does need a new EpiPen.   Skin Symptom History: She is currently having issues with what  looks like mastitis, but there is a lot of purulence there as well. She has had MRSA in the past which only responded to clindamycin.  She has a Armed forces operational officer, although she is looking for a second opinion. She has a history of hidradenitis suppurativa. She reports that her current dermatologist is not really doing much to help her. She is open to a new referral.   Otherwise, there have been no changes to her past medical history, surgical history, family history, or social history.    Review of Systems  Constitutional: Negative.  Negative for chills, fever, malaise/fatigue and weight loss.  HENT: Negative.  Negative for congestion, ear discharge and ear pain.   Eyes:  Negative for pain, discharge and redness.  Respiratory:  Positive for cough and shortness of breath. Negative for sputum production and wheezing.   Cardiovascular: Negative.  Negative for chest pain and palpitations.  Gastrointestinal:  Negative for abdominal pain, heartburn, nausea and vomiting.  Skin:  Positive for rash. Negative for itching.  Neurological:  Negative for dizziness and headaches.  Endo/Heme/Allergies:  Negative for environmental allergies. Does not bruise/bleed easily.       Objective:   Blood pressure 120/80, pulse 86, temperature 98 F (36.7 C), temperature source Temporal, resp. rate 20, height 5' 8.5" (1.74 m), weight 286 lb 14.4 oz (130.1 kg), SpO2 97 %. Body mass index is 42.98 kg/m.    Physical Exam Vitals reviewed.  Constitutional:      Appearance: She is well-developed. She is obese.     Comments: However, it does look like she has lost a lot of weight since last time I saw her.  HENT:     Head: Normocephalic and atraumatic.     Right Ear: Tympanic membrane, ear canal and external ear normal.     Left Ear: Tympanic membrane, ear canal and external ear normal.     Nose: No nasal deformity, septal deviation, mucosal edema or rhinorrhea.     Right Turbinates: Enlarged, swollen and pale.      Left Turbinates: Enlarged, swollen and pale.     Right Sinus: No maxillary sinus tenderness or frontal sinus tenderness.     Left Sinus: No maxillary sinus tenderness or frontal sinus tenderness.     Mouth/Throat:     Lips: Pink.     Mouth: Mucous membranes are moist. Mucous membranes are not pale and not dry.     Pharynx: Uvula midline.     Comments: Cobblestoning present in the posterior oropharynx. Eyes:     General:        Right eye: No discharge.        Left eye: No discharge.     Conjunctiva/sclera: Conjunctivae normal.     Right eye: Right conjunctiva is not injected. No chemosis.    Left eye: Left conjunctiva is not injected. No chemosis.    Pupils: Pupils are equal, round, and reactive to light.  Cardiovascular:     Rate and Rhythm: Normal rate  and regular rhythm.     Heart sounds: Normal heart sounds.  Pulmonary:     Effort: Pulmonary effort is normal. No tachypnea, accessory muscle usage or respiratory distress.     Breath sounds: Normal breath sounds. No wheezing, rhonchi or rales.  Chest:     Chest wall: No tenderness.  Lymphadenopathy:     Cervical: No cervical adenopathy.  Skin:    General: Skin is warm.     Capillary Refill: Capillary refill takes less than 2 seconds.     Coloration: Skin is not pale.     Findings: Rash present. No abrasion, erythema or petechiae. Rash is not papular, urticarial or vesicular.     Comments: She has some purulence and the erythema under her right breast. The breast is painful to palpation.   Neurological:     Mental Status: She is alert.  Psychiatric:        Behavior: Behavior is cooperative.      Diagnostic studies: none      Malachi Bonds, MD  [facility]

## 2022-11-14 NOTE — Patient Instructions (Addendum)
1. Moderate persistent asthma, uncomplicated - Lung testing not done today.  - We are going to change you to Trelegy (contains three medications to help you breathe better).  - This is easier to administer since this is it once daily. - Daily controller medication(s): Trelegy 200/62.5/25 one puff once daily - Prior to physical activity: albuterol 2 puffs 10-15 minutes before physical activity. - Rescue medications: albuterol 4 puffs every 4-6 hours as needed - Asthma control goals:  * Full participation in all desired activities (may need albuterol before activity) * Albuterol use two time or less a week on average (not counting use with activity) * Cough interfering with sleep two time or less a month * Oral steroids no more than once a year * No hospitalizations  2. Anaphylactic shock due to food (shellfish) - EpiPen refilled.    3. Perennial and seasonal allergic rhinitis (grasses, weeds, ragweed, trees, molds, cat, dog, cockroach, and dust mite) - Continue with fluticasone nasal spray two sprays per nostril daily EVERY DAY (brown bottle).  - Continue with levocetirizine 5mg  tablet once daily.  - Continue montelukast (Singulair) 10mg  daily.    4. Suppurativa hiadrenitis - We are going to refer you to see Dr. , who follows another patient of mine with it.  - She is at [facility].  5. Return in about 6 months (around 10/31/2021).   Please inform [facility] of any Emergency Department visits, hospitalizations, or changes in symptoms. Call 14/05/2021 before going to the ED for breathing or allergy symptoms since we might be able to fit you in for a sick visit. Feel free to contact us anytime with any questions, problems, or concerns.  It was a pleasure to see you and your family again today!  Websites that have reliable patient information: 1. American Academy of Asthma, Allergy, and Immunology: www.aaaai.org 2. Food Allergy Research and Education (FARE): foodallergy.org 3.  Mothers of Asthmatics: http://www.asthmacommunitynetwork.org 4. American College of Allergy, Asthma, and Immunology: www.acaai.org   COVID-19 Vaccine Information can be found at: [place] For questions related to vaccine distribution or appointments, please email [email or web address] or call [phone].   We realize that you might be concerned about having an allergic reaction to the COVID19 vaccines. To help with that concern, WE ARE OFFERING THE COVID19 VACCINES IN OUR OFFICE! Ask the front desk for dates!     "Like" [email or web address] on Facebook and Instagram for our latest updates!      A healthy democracy works best when [phone] participate! Make sure you are registered to vote! If you have moved or changed any of your contact information, you will need to get this updated before voting!  In some cases, you MAY be able to register to vote online: [place]

## 2022-11-17 ENCOUNTER — Other Ambulatory Visit: Payer: Self-pay | Admitting: Allergy & Immunology

## 2022-11-19 ENCOUNTER — Encounter: Payer: Self-pay | Admitting: Allergy & Immunology

## 2022-11-20 ENCOUNTER — Telehealth: Payer: Self-pay

## 2022-11-20 NOTE — Telephone Encounter (Signed)
-----   Message from Willeen Niece, MD sent at 11/20/2022  9:27 AM EST ----- Marchelle Folks, could you look into scheduling this patient with me?  She already has an appt with Dr. Gwen Pounds scheduled, but not until 10/30/23.  Thanks. ----- Message ----- From: Alfonse Spruce, MD Sent: 11/19/2022   7:55 AM EST To: Willeen Niece, MD; Dyasia A Neal-Mims, NT  Hey there - Can we we refer Idell to Dr. Roseanne Reno for management of her hidradenitis suppurativa? Dr. Roseanne Reno does an excellent job of managing another one of my patients with this condition. Thanks!

## 2022-11-20 NOTE — Telephone Encounter (Signed)
Left message for patient to return my call. aw 

## 2022-11-22 NOTE — Progress Notes (Signed)
Hey there,  I wanted to double check and see if there was anything I needed to do on my end to help? There is a referral on file currently, it just doesn't have a providers name in it.   Thanks

## 2022-11-28 ENCOUNTER — Other Ambulatory Visit: Payer: Self-pay | Admitting: Internal Medicine

## 2022-11-28 NOTE — Progress Notes (Signed)
Thank you for your help with this.   Take Care

## 2022-11-28 NOTE — Telephone Encounter (Signed)
Called and left msg again today. aw

## 2022-12-02 LAB — FOLATE: Folate: 12.1 ng/mL

## 2022-12-02 LAB — IRON, TOTAL/TOTAL IRON BINDING CAP
%SAT: 3 % (calc) — ABNORMAL LOW (ref 16–45)
Iron: 13 ug/dL — ABNORMAL LOW (ref 40–190)
TIBC: 437 mcg/dL (calc) (ref 250–450)

## 2022-12-02 LAB — CBC
HCT: 31.1 % — ABNORMAL LOW (ref 35.0–45.0)
Hemoglobin: 8.9 g/dL — ABNORMAL LOW (ref 11.7–15.5)
MCH: 19.2 pg — ABNORMAL LOW (ref 27.0–33.0)
MCHC: 28.6 g/dL — ABNORMAL LOW (ref 32.0–36.0)
MCV: 67.2 fL — ABNORMAL LOW (ref 80.0–100.0)
Platelets: 379 10*3/uL (ref 140–400)
RBC: 4.63 10*6/uL (ref 3.80–5.10)
RDW: 20.1 % — ABNORMAL HIGH (ref 11.0–15.0)
WBC: 11.9 10*3/uL — ABNORMAL HIGH (ref 3.8–10.8)

## 2022-12-02 LAB — VITAMIN B12: Vitamin B-12: 301 pg/mL (ref 200–1100)

## 2022-12-02 LAB — SICKLE CELL SCREEN: Sickle Solubility Test - HGBRFX: POSITIVE — AB

## 2022-12-02 LAB — VITAMIN D 25 HYDROXY (VIT D DEFICIENCY, FRACTURES): Vit D, 25-Hydroxy: 17 ng/mL — ABNORMAL LOW (ref 30–100)

## 2022-12-02 LAB — FERRITIN: Ferritin: 3 ng/mL — ABNORMAL LOW (ref 16–232)

## 2022-12-22 IMAGING — CR DG FOOT COMPLETE 3+V*L*
3 series · 3 of 3 positions shown · non-contrast
Comparison: None.

CLINICAL DATA: Patient fell about a month ago with persistent toe
pain.

EXAM:
LEFT FOOT - COMPLETE 3+ VIEW

[x foot ap left]
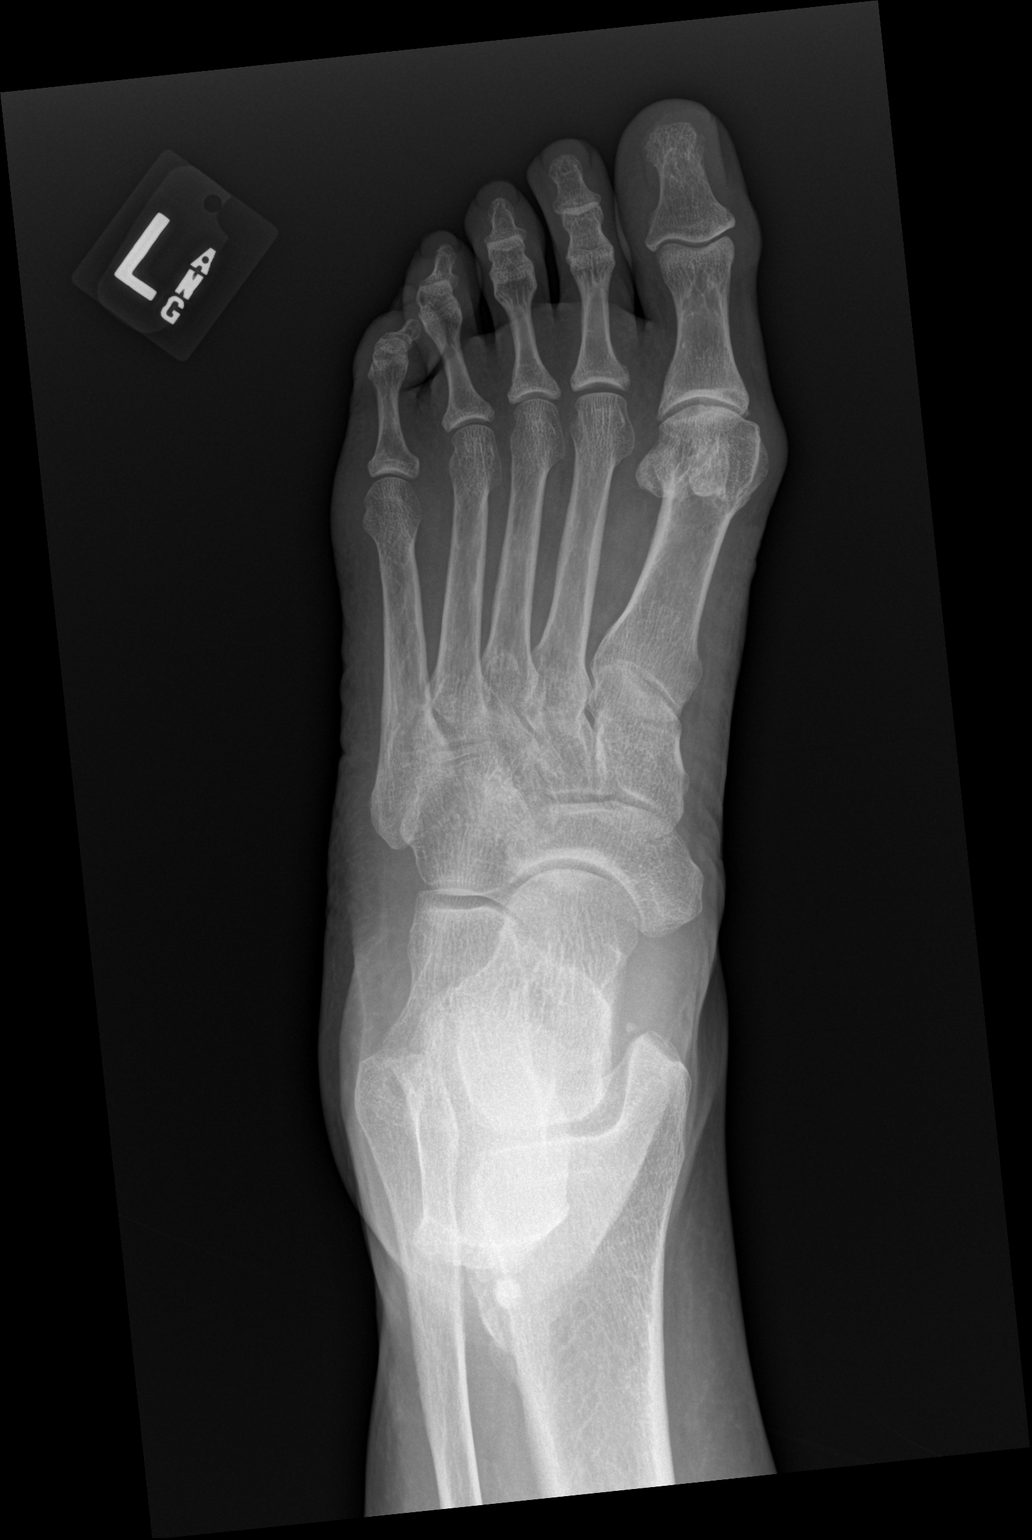

[x foot obl left]
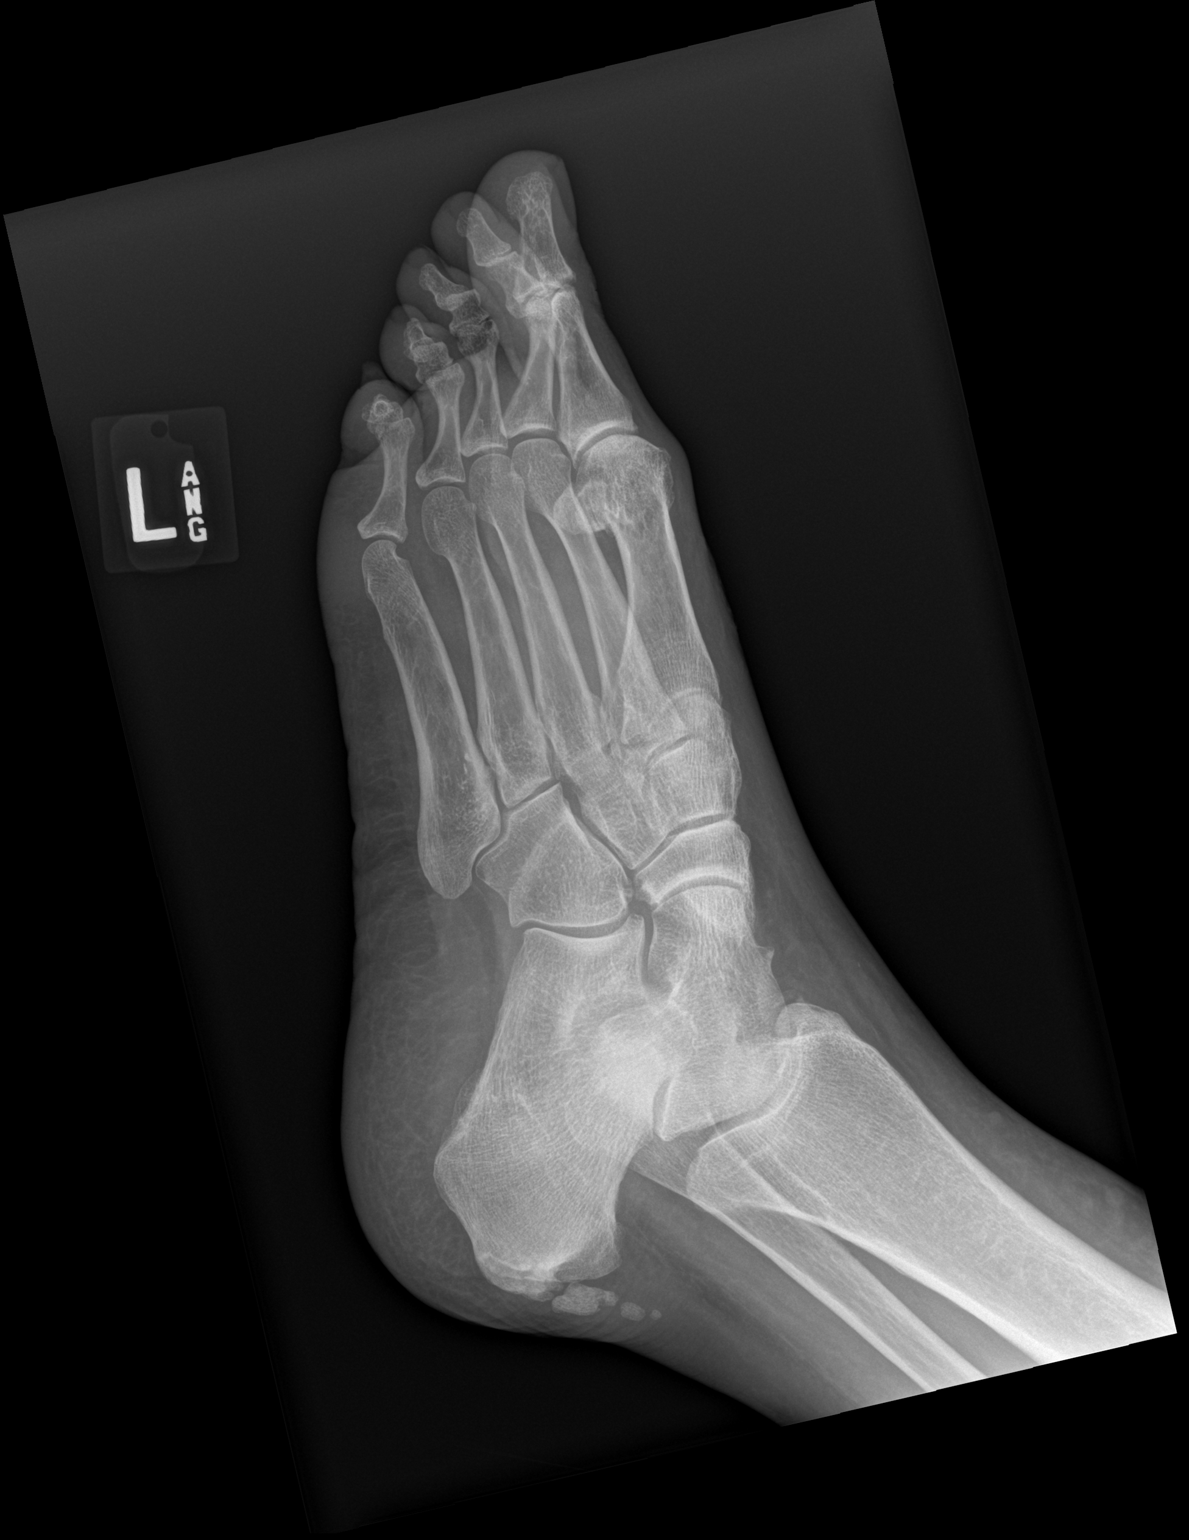

[x foot lat left]
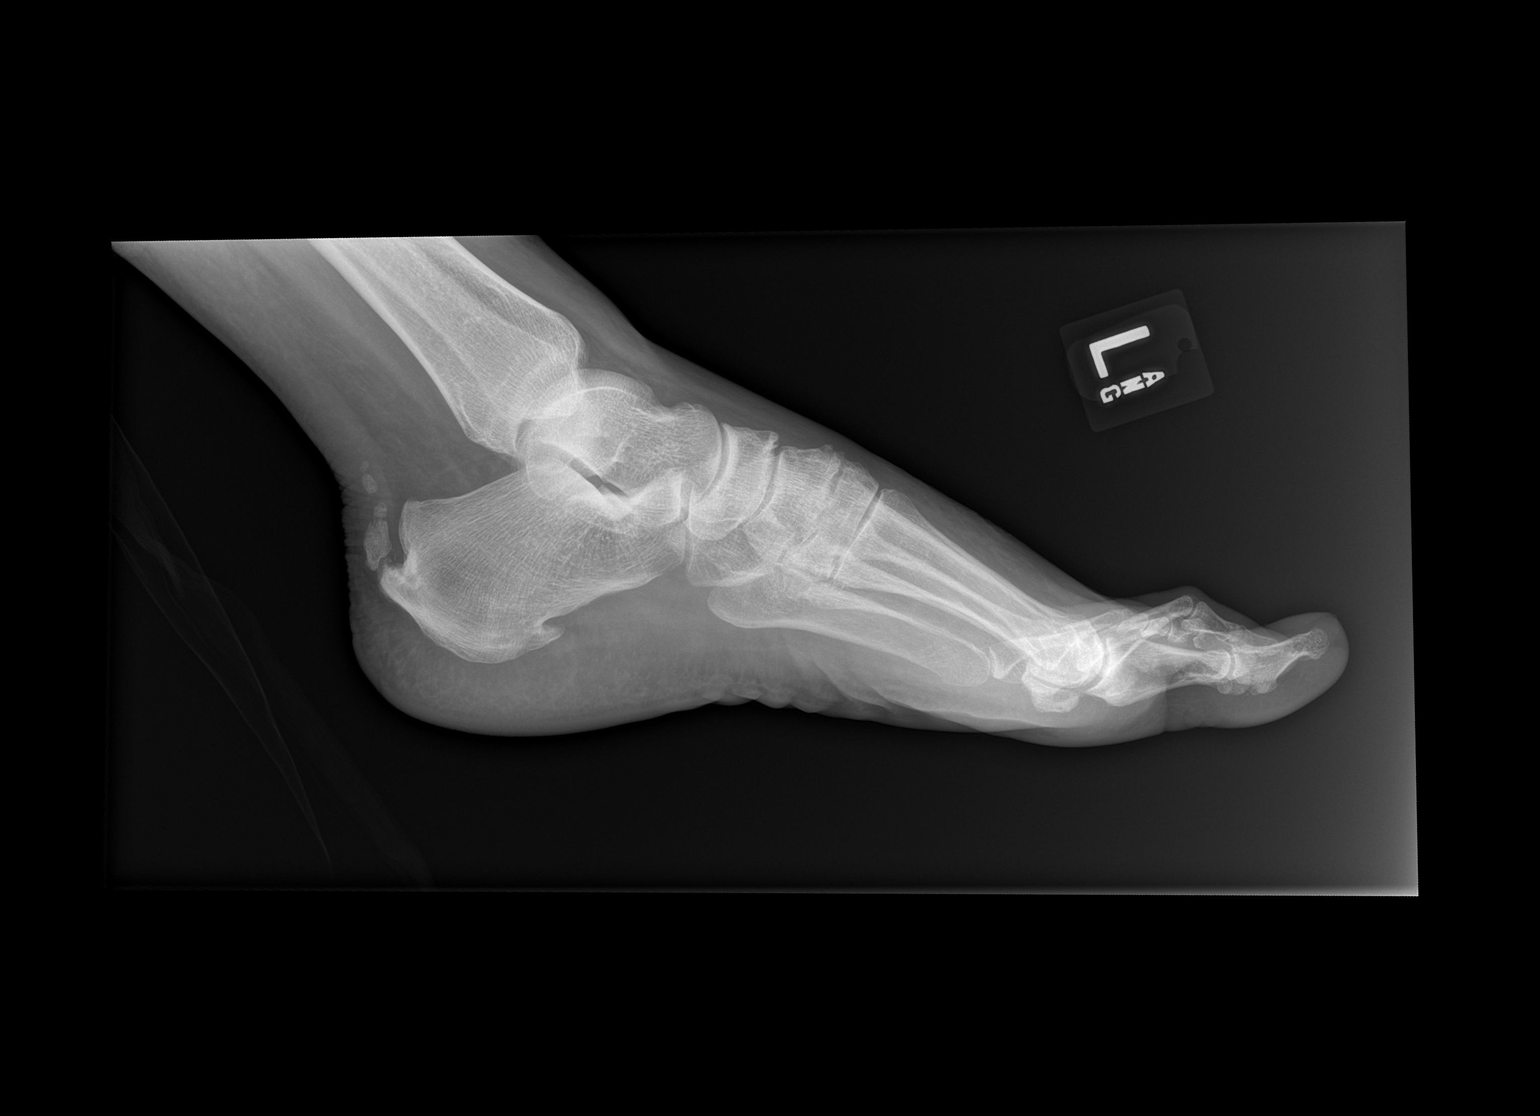

[3 of 3 positions shown; findings below may reference images not displayed]

FINDINGS: No evidence for an acute fracture. No subluxation or dislocation. No
worrisome lytic or sclerotic osseous abnormality. Ossification in
the distal Achilles tendon suggest repetitive or chronic trauma.
IMPRESSION: No acute bony abnormality.

## 2022-12-31 NOTE — Telephone Encounter (Signed)
Left 3rd and final message for patient today. aw

## 2023-01-02 ENCOUNTER — Telehealth: Payer: Self-pay | Admitting: Allergy & Immunology

## 2023-01-02 NOTE — Telephone Encounter (Signed)
Patient returned call to our office and scheduled sooner with Dr. Nicole Kindred.  Thank you Dr. Ernst Bowler!

## 2023-01-02 NOTE — Telephone Encounter (Signed)
I called the patient and left a voicemail that was rather stern telling her that she needed to answer her phone call when dermatology calls her.  The dermatology practice has been going out of their way to try to get her in sooner and the patient cannot even answer her phone.  Understandably, the dermatology practice has given up trying to contact the patient to get her in sooner.  Salvatore Marvel, MD [facility] [place]

## 2023-01-02 NOTE — Telephone Encounter (Signed)
Good Lord, it should not take 10 phone calls for the patient to actually return one! Thanks for all of your help and patience!   Salvatore Marvel, MD [facility] [place] [facility]

## 2023-01-03 ENCOUNTER — Other Ambulatory Visit (HOSPITAL_COMMUNITY)
Admission: RE | Admit: 2023-01-03 | Discharge: 2023-01-03 | Disposition: A | Discharge status: Home / Self Care | Payer: 59 | Source: Ambulatory Visit | Attending: Obstetrics | Admitting: Obstetrics

## 2023-01-03 ENCOUNTER — Ambulatory Visit (INDEPENDENT_AMBULATORY_CARE_PROVIDER_SITE_OTHER): Payer: 59 | Admitting: Obstetrics

## 2023-01-03 ENCOUNTER — Encounter: Payer: Self-pay | Admitting: Obstetrics

## 2023-01-03 VITALS — BP 146/91 | HR 94 | Ht 69.0 in | Wt 302.0 lb

## 2023-01-03 DIAGNOSIS — Z01419 Encounter for gynecological examination (general) (routine) without abnormal findings: Secondary | ICD-10-CM

## 2023-01-03 DIAGNOSIS — J4520 Mild intermittent asthma, uncomplicated: Secondary | ICD-10-CM

## 2023-01-03 DIAGNOSIS — R52 Pain, unspecified: Secondary | ICD-10-CM

## 2023-01-03 DIAGNOSIS — N926 Irregular menstruation, unspecified: Secondary | ICD-10-CM | POA: Diagnosis not present

## 2023-01-03 DIAGNOSIS — Z1239 Encounter for other screening for malignant neoplasm of breast: Secondary | ICD-10-CM

## 2023-01-03 DIAGNOSIS — E0500 Thyrotoxicosis with diffuse goiter without thyrotoxic crisis or storm: Secondary | ICD-10-CM

## 2023-01-03 DIAGNOSIS — L732 Hidradenitis suppurativa: Secondary | ICD-10-CM

## 2023-01-03 DIAGNOSIS — Z6841 Body Mass Index (BMI) 40.0 and over, adult: Secondary | ICD-10-CM

## 2023-01-03 DIAGNOSIS — F17200 Nicotine dependence, unspecified, uncomplicated: Secondary | ICD-10-CM

## 2023-01-03 MED ORDER — IBUPROFEN 800 MG PO TABS: 800.0000 mg | ORAL_TABLET | Freq: Three times a day (TID) | ORAL | 5 refills | Status: DC | PRN

## 2023-01-03 NOTE — Progress Notes (Unsigned)
Subjective:        [name] is a 47 y.o. female here for a routine exam.  Current complaints: Irregular periods.    Personal health questionnaire:  Is patient Ashkenazi Jewish, have a family history of breast and/or ovarian cancer: no Is there a family history of uterine cancer diagnosed at age < 72, gastrointestinal cancer, urinary tract cancer, family member who is a Field seismologist syndrome-associated carrier: no Is the patient overweight and hypertensive, family history of diabetes, personal history of gestational diabetes, preeclampsia or PCOS: yes Is patient over 51, have PCOS,  family history of premature CHD under age 33, diabetes, smoke, have hypertension or peripheral artery disease:  no At any time, has a partner hit, kicked or otherwise hurt or frightened you?: no Over the past 2 weeks, have you felt down, depressed or hopeless?: no Over the past 2 weeks, have you felt little interest or pleasure in doing things?:no   Gynecologic History Patient's last menstrual period was 11/29/2022. Contraception: tubal ligation Last Pap: 20 yrs ago. Results were: normal Last mammogram: 2016. Results were: normal  Obstetric History OB History  Gravida Para Term Preterm AB Living  3 2 2   1    $ SAB IAB Ectopic Multiple Live Births  1            # Outcome Date GA Lbr Len/2nd Weight Sex Delivery Anes PTL Lv  3 Term      CS-Unspec     2 SAB           1 Term      CS-Unspec       Past Medical History:  Diagnosis Date   Asthma    Hydradenitis    MRSA (methicillin resistant staph aureus) culture positive    Obesity     Past Surgical History:  Procedure Laterality Date   CESAREAN SECTION     x2   TUBAL LIGATION     WISDOM TOOTH EXTRACTION  2011     Current Outpatient Medications:    albuterol (PROAIR HFA) 108 (90 Base) MCG/ACT inhaler, Inhale 4 puffs every 4-6 hours as needed, Disp: 18 g, Rfl: 1   albuterol (PROVENTIL) (2.5 MG/3ML) 0.083% nebulizer solution, Take 3 mLs (2.5  mg total) by nebulization every 4 (four) hours as needed for wheezing or shortness of breath., Disp: 75 mL, Rfl: 0   albuterol (VENTOLIN HFA) 108 (90 Base) MCG/ACT inhaler, Inhale 2 puffs by mouth every 4 hours as needed for wheezing or shortness of breath, Disp: 8 each, Rfl: 0   amitriptyline (ELAVIL) 25 MG tablet, Take 25 mg by mouth at bedtime., Disp: , Rfl:    EPINEPHrine 0.3 mg/0.3 mL IJ SOAJ injection, Inject 0.3 mg into the muscle as needed for anaphylaxis., Disp: 1 each, Rfl: 0   esomeprazole (NEXIUM) 40 MG capsule, Take 1 capsule by mouth twice daily, Disp: 60 capsule, Rfl: 11   fluticasone (FLONASE) 50 MCG/ACT nasal spray, Use 1 spray into each nostril every day, Disp: 16 g, Rfl: 5   Fluticasone-Umeclidin-Vilant (TRELEGY ELLIPTA) 200-62.5-25 MCG/ACT AEPB, Inhale 1 puff into the lungs daily., Disp: 28 each, Rfl: 5   ibuprofen (ADVIL) 800 MG tablet, Take 1 tablet (800 mg total) by mouth every 8 (eight) hours as needed (pain)., Disp: 21 tablet, Rfl: 0   levocetirizine (XYZAL) 5 MG tablet, Take 1 tablet by mouth every day as needed for allergies, Disp: 30 tablet, Rfl: 11   methimazole (TAPAZOLE) 10 MG tablet, TAKE 1 AND  1/2 TABLETS DAILY BY MOUTH, Disp: 45 tablet, Rfl: 0   montelukast (SINGULAIR) 10 MG tablet, Take 1 tablet (10 mg total) by mouth at bedtime., Disp: 30 tablet, Rfl: 5   Spacer/Aero-Holding Chambers (AEROCHAMBER PLUS) inhaler, Use as instructed, Disp: 1 each, Rfl: 2   traMADol (ULTRAM) 50 MG tablet, Take 50 mg by mouth 2 (two) times daily as needed., Disp: , Rfl:    doxycycline (VIBRA-TABS) 100 MG tablet, Take 100 mg by mouth 2 (two) times daily. (Patient not taking: Reported on 01/03/2023), Disp: , Rfl:  Allergies  Allergen Reactions   Shellfish Allergy     Social History   Tobacco Use   Smoking status: Some Days    Packs/day: 0.33    Types: Cigarettes   Smokeless tobacco: Never  Substance Use Topics   Alcohol use: Yes    Comment: once a month at a party beer wine or  mixed drinks    Family History  Problem Relation Age of Onset   Diabetes Mother    Hypertension Mother    Diabetes Father    Asthma Other    Hyperlipidemia Other    Hypertension Other       Review of Systems  Constitutional: negative for fatigue and weight loss Respiratory: negative for cough and wheezing Cardiovascular: negative for chest pain, fatigue and palpitations Gastrointestinal: negative for abdominal pain and change in bowel habits Musculoskeletal:negative for myalgias Neurological: negative for gait problems and tremors Behavioral/Psych: negative for abusive relationship, depression Endocrine: negative for temperature intolerance    Genitourinary:negative for abnormal menstrual periods, genital lesions, hot flashes, sexual problems and vaginal discharge Integument/breast: negative for breast lump, breast tenderness, nipple discharge and skin lesion(s)    Objective:       BP (!) 146/91   Pulse 94   Ht 5' 9"$  (1.753 m)   Wt (!) 302 lb (137 kg)   LMP 11/29/2022   BMI 44.60 kg/m  General:   alert  Skin:   no rash or abnormalities  Lungs:   clear to auscultation bilaterally  Heart:   regular rate and rhythm, S1, S2 normal, no murmur, click, rub or gallop  Breasts:   normal without suspicious masses, skin or nipple changes or axillary nodes  Abdomen:  normal findings: no organomegaly, soft, non-tender and no hernia  Pelvis:  External genitalia: normal general appearance Urinary system: urethral meatus normal and bladder without fullness, nontender Vaginal: normal without tenderness, induration or masses Cervix: normal appearance Adnexa: normal bimanual exam Uterus: anteverted and non-tender, normal size   Lab Review Urine pregnancy test Labs reviewed yes Radiologic studies reviewed yes  I have spent a total of 20 minutes of face-to-face time, excluding clinical staff time, reviewing notes and preparing to see patient, ordering tests and/or medications, and  counseling the patient.   Assessment:    Healthy female exam.    Plan:    {plan:19193}   No orders of the defined types were placed in this encounter.  No orders of the defined types were placed in this encounter.  Need to obtain previous records Possible management options include:*** Follow up as needed. Shelly Bombard, MD 01/03/2023 9:36 AM

## 2023-01-03 NOTE — Progress Notes (Unsigned)
Pt presents for AEX. Pt reports last PAP was 20 years ago. Last mammogram 2019. Declines STD testing.

## 2023-01-08 ENCOUNTER — Other Ambulatory Visit: Payer: Self-pay | Admitting: Dermatology

## 2023-01-08 ENCOUNTER — Ambulatory Visit (INDEPENDENT_AMBULATORY_CARE_PROVIDER_SITE_OTHER): Payer: 59 | Admitting: Dermatology

## 2023-01-08 VITALS — BP 130/80 | HR 90

## 2023-01-08 DIAGNOSIS — L732 Hidradenitis suppurativa: Secondary | ICD-10-CM

## 2023-01-08 LAB — CYTOLOGY - PAP
Comment: NEGATIVE
Diagnosis: NEGATIVE
High risk HPV: NEGATIVE

## 2023-01-08 MED ORDER — CLINDAMYCIN PHOSPHATE 1 % EX SOLN: CUTANEOUS | 5 refills | Status: DC

## 2023-01-08 MED ORDER — COSENTYX UNOREADY 300 MG/2ML ~~LOC~~ SOAJ: 1.0000 | SUBCUTANEOUS | 4 refills | Status: DC

## 2023-01-08 MED ORDER — MINOCYCLINE HCL ER 135 MG PO TB24: ORAL_TABLET | ORAL | 0 refills | Status: DC

## 2023-01-08 MED ORDER — COSENTYX UNOREADY 300 MG/2ML ~~LOC~~ SOAJ: 1.0000 | SUBCUTANEOUS | 5 refills | Status: DC

## 2023-01-08 NOTE — Progress Notes (Signed)
New Patient Visit  Subjective  [name] is a 47 y.o. female who presents for the following: Hidradenitis (Inframammary, axillary - has had issues all her life, but officially diagnosed about 7 years ago, fhx as well. Currently she is using Doxycycline 100 mg po QD can only tolerate QD due to yeast infection).  She has been taking that for years without improvement.  She has actively draining cysts on her breasts. She has h/o MRSA in past   The following portions of the chart were reviewed this encounter and updated as appropriate:       Review of Systems:  No other skin or systemic complaints except as noted in HPI or Assessment and Plan.  Objective  Well appearing patient in no apparent distress; mood and affect are within normal limits.  A focused examination was performed including the face, breast, and axilla. Relevant physical exam findings are noted in the Assessment and Plan.  B/L breast, axillary Numerous firm hyperpigmented ropey nodules consistent with scarring and cysts of the axillary  area. Breasts and areola with hyperpigmented plaques and open oozing sores; inframammary, L flank, gluteal cleft with hyperpigmented scars and cysts.    Assessment & Plan  Hidradenitis suppurativa B/L breast, axillary  Severe.  Chronic and persistent condition with duration or expected duration over one year. Condition is bothersome/symptomatic for patient. Currently flared.   Hidradenitis Suppurativa is a chronic; persistent; non-curable, but treatable condition due to abnormal inflamed sweat glands in the body folds (axilla, inframammary, groin, medial thighs), causing recurrent painful draining cysts and scarring. It can be associated with severe scarring acne and cysts; also abscesses and scarring of scalp. The goal is control and prevention of flares, as it is not curable. Scars are permanent and can be thickened. Treatment may include daily use of topical medication and oral  antibiotics.  Oral isotretinoin may also be helpful.  For some cases, Humira or Cosentyx (biologic injections) may be prescribed to decrease the inflammatory process and prevent flares.  When indicated, inflamed cysts may also be treated surgically.   Pending labs start Cosentyx injections. Information pamphlet given to patient. If not covered by insurance will switch to Humira.   D/C Doxycycline. Start Minocycline 100 mg po QD.   Start Clindamycin lotion apply to aa's QD after showering.   Recommend antibacterial soap while showering. Ok to continue Dial. Patient has tried OTC BP washes for a year, but didn't find it helpful.  Reviewed risks of biologics including immunosuppression, infections, injection site reaction, and failure to improve condition. Goal is control of skin condition, not cure.  Some older biologics such as Humira and Enbrel may slightly increase risk of malignancy and may worsen congestive heart failure.  Talz and Cosentyx may cause inflammatory bowel disease to flare. The use of biologics requires long term medication management, including periodic office visits and monitoring of blood work.   QuantiFERON-TB Gold Plus - B/L breast, axillary  Hep C Antibody - B/L breast, axillary  Hep B Surface Antigen - B/L breast, axillary  Hep B Surface Antibody - B/L breast, axillary  HIV antibody (with reflex) - B/L breast, axillary  CMP - B/L breast, axillary  clindamycin (CLEOCIN T) 1 % external solution - B/L breast, axillary Apply to open sores QD after shower.  Minocycline HCl 135 MG TB24 - B/L breast, axillary Take one cap po QD.  CBC with Differential/Platelets - B/L breast, axillary  Secukinumab (COSENTYX UNOREADY) 300 MG/2ML SOAJ - B/L breast, axillary Inject  1 Pen into the skin every 28 (twenty-eight) days.  Secukinumab (COSENTYX UNOREADY) 300 MG/2ML SOAJ - B/L breast, axillary Inject 1 Pen into the skin every 7 (seven) days. Inject 1 pen SQ QW x 5  weeks.   Return in about 1 month (around 02/06/2023) for HS follow up .  Luther Redo, CMA, am acting as scribe for Brendolyn Patty, MD .  Documentation: I have reviewed the above documentation for accuracy and completeness, and I agree with the above.  Brendolyn Patty MD

## 2023-01-08 NOTE — Patient Instructions (Signed)
Due to recent changes in healthcare laws, you may see results of your pathology and/or laboratory studies on MyChart before the doctors have had a chance to review them. We understand that in some cases there may be results that are confusing or concerning to you. Please understand that not all results are received at the same time and often the doctors may need to interpret multiple results in order to provide you with the best plan of care or course of treatment. Therefore, we ask that you please give us 2 business days to thoroughly review all your results before contacting the office for clarification. Should we see a critical lab result, you will be contacted sooner.   If You Need Anything After Your Visit  If you have any questions or concerns for your doctor, please call our main line at [phone] and press option 4 to reach your doctor's medical assistant. If no one answers, please leave a voicemail as directed and we will return your call as soon as possible. Messages left after 4 pm will be answered the following business day.   You may also send us a message via MyChart. We typically respond to MyChart messages within 1-2 business days.  For prescription refills, please ask your pharmacy to contact our office. Our fax number is [phone].  If you have an urgent issue when the clinic is closed that cannot wait until the next business day, you can page your doctor at the number below.    Please note that while we do our best to be available for urgent issues outside of office hours, we are not available 24/7.   If you have an urgent issue and are unable to reach us, you may choose to seek medical care at your doctor's office, retail clinic, urgent care center, or emergency room.  If you have a medical emergency, please immediately call 911 or go to the emergency department.  Pager Numbers  - Dr. Kowalski: [phone]  - Dr. Moye: [phone]  - Dr. Stewart:  [phone]  In the event of inclement weather, please call our main line at [phone] for an update on the status of any delays or closures.  Dermatology Medication Tips: Please keep the boxes that topical medications come in in order to help keep track of the instructions about where and how to use these. Pharmacies typically print the medication instructions only on the boxes and not directly on the medication tubes.   If your medication is too expensive, please contact our office at [phone] option 4 or send us a message through MyChart.   We are unable to tell what your co-pay for medications will be in advance as this is different depending on your insurance coverage. However, we may be able to find a substitute medication at lower cost or fill out paperwork to get insurance to cover a needed medication.   If a prior authorization is required to get your medication covered by your insurance company, please allow us 1-2 business days to complete this process.  Drug prices often vary depending on where the prescription is filled and some pharmacies may offer cheaper prices.  The website www.goodrx.com contains coupons for medications through different pharmacies. The prices here do not account for what the cost may be with help from insurance (it may be cheaper with your insurance), but the website can give you the price if you did not use any insurance.  - You can print the associated coupon and take it with   your prescription to the pharmacy.  - You may also stop by our office during regular business hours and pick up a GoodRx coupon card.  - If you need your prescription sent electronically to a different pharmacy, notify our office through [facility] MyChart or by phone at [phone] option 4.     Si Usted Necesita Algo Despus de Su Visita  Tambin puede enviarnos un mensaje a travs de MyChart. Por lo general respondemos a los mensajes de MyChart en el transcurso de 1 a 2  das hbiles.  Para renovar recetas, por favor pida a su farmacia que se ponga en contacto con nuestra oficina. Nuestro nmero de fax es el [phone].  Si tiene un asunto urgente cuando la clnica est cerrada y que no puede esperar hasta el siguiente da hbil, puede llamar/localizar a su doctor(a) al nmero que aparece a continuacin.   Por favor, tenga en cuenta que aunque hacemos todo lo posible para estar disponibles para asuntos urgentes fuera del horario de oficina, no estamos disponibles las 24 horas del da, los 7 das de la semana.   Si tiene un problema urgente y no puede comunicarse con nosotros, puede optar por buscar atencin mdica  en el consultorio de su doctor(a), en una clnica privada, en un centro de atencin urgente o en una sala de emergencias.  Si tiene una emergencia mdica, por favor llame inmediatamente al 911 o vaya a la sala de emergencias.  Nmeros de bper  - Dr. Kowalski: [phone]  - Dra. Moye: [phone]  - Dra. Stewart: [phone]  En caso de inclemencias del tiempo, por favor llame a nuestra lnea principal al [phone] para una actualizacin sobre el estado de cualquier retraso o cierre.  Consejos para la medicacin en dermatologa: Por favor, guarde las cajas en las que vienen los medicamentos de uso tpico para ayudarle a seguir las instrucciones sobre dnde y cmo usarlos. Las farmacias generalmente imprimen las instrucciones del medicamento slo en las cajas y no directamente en los tubos del medicamento.   Si su medicamento es muy caro, por favor, pngase en contacto con nuestra oficina llamando al [phone] y presione la opcin 4 o envenos un mensaje a travs de MyChart.   No podemos decirle cul ser su copago por los medicamentos por adelantado ya que esto es diferente dependiendo de la cobertura de su seguro. Sin embargo, es posible que podamos encontrar un medicamento sustituto a menor costo o llenar un formulario para que el  seguro cubra el medicamento que se considera necesario.   Si se requiere una autorizacin previa para que su compaa de seguros cubra su medicamento, por favor permtanos de 1 a 2 das hbiles para completar este proceso.  Los precios de los medicamentos varan con frecuencia dependiendo del lugar de dnde se surte la receta y alguna farmacias pueden ofrecer precios ms baratos.  El sitio web www.goodrx.com tiene cupones para medicamentos de diferentes farmacias. Los precios aqu no tienen en cuenta lo que podra costar con la ayuda del seguro (puede ser ms barato con su seguro), pero el sitio web puede darle el precio si no utiliz ningn seguro.  - Puede imprimir el cupn correspondiente y llevarlo con su receta a la farmacia.  - Tambin puede pasar por nuestra oficina durante el horario de atencin regular y recoger una tarjeta de cupones de GoodRx.  - Si necesita que su receta se enve electrnicamente a una farmacia diferente, informe a nuestra oficina a travs de MyChart de [place]   o por telfono llamando al [phone] y presione la opcin 4.  

## 2023-01-09 ENCOUNTER — Ambulatory Visit
Admission: RE | Admit: 2023-01-09 | Discharge: 2023-01-09 | Disposition: A | Discharge status: Home / Self Care | Payer: 59 | Source: Ambulatory Visit | Attending: Obstetrics | Admitting: Obstetrics

## 2023-01-09 DIAGNOSIS — Z1239 Encounter for other screening for malignant neoplasm of breast: Secondary | ICD-10-CM

## 2023-01-09 LAB — CBC WITH DIFFERENTIAL/PLATELET
Basophils Absolute: 0.1 10*3/uL (ref 0.0–0.2)
Basos: 1 %
EOS (ABSOLUTE): 0.3 10*3/uL (ref 0.0–0.4)
Eos: 3 %
Hematocrit: 36.2 % (ref 34.0–46.6)
Hemoglobin: 10.9 g/dL — ABNORMAL LOW (ref 11.1–15.9)
Immature Grans (Abs): 0 10*3/uL (ref 0.0–0.1)
Immature Granulocytes: 0 %
Lymphocytes Absolute: 2.1 10*3/uL (ref 0.7–3.1)
Lymphs: 19 %
MCH: 22.4 pg — ABNORMAL LOW (ref 26.6–33.0)
MCHC: 30.1 g/dL — ABNORMAL LOW (ref 31.5–35.7)
MCV: 74 fL — ABNORMAL LOW (ref 79–97)
Monocytes Absolute: 0.6 10*3/uL (ref 0.1–0.9)
Monocytes: 6 %
Neutrophils Absolute: 7.7 10*3/uL — ABNORMAL HIGH (ref 1.4–7.0)
Neutrophils: 71 %
Platelets: 216 10*3/uL (ref 150–450)
RBC: 4.87 x10E6/uL (ref 3.77–5.28)
WBC: 10.7 10*3/uL (ref 3.4–10.8)

## 2023-01-13 ENCOUNTER — Telehealth: Payer: Self-pay

## 2023-01-13 LAB — COMPREHENSIVE METABOLIC PANEL
ALT: 8 IU/L (ref 0–32)
AST: 13 IU/L (ref 0–40)
Albumin/Globulin Ratio: 1 — ABNORMAL LOW (ref 1.2–2.2)
Albumin: 3.8 g/dL — ABNORMAL LOW (ref 3.9–4.9)
Alkaline Phosphatase: 81 IU/L (ref 44–121)
BUN/Creatinine Ratio: 26 — ABNORMAL HIGH (ref 9–23)
BUN: 15 mg/dL (ref 6–24)
Bilirubin Total: 0.2 mg/dL (ref 0.0–1.2)
CO2: 19 mmol/L — ABNORMAL LOW (ref 20–29)
Calcium: 8.9 mg/dL (ref 8.7–10.2)
Chloride: 104 mmol/L (ref 96–106)
Creatinine, Ser: 0.58 mg/dL (ref 0.57–1.00)
Globulin, Total: 3.9 g/dL (ref 1.5–4.5)
Glucose: 97 mg/dL (ref 70–99)
Potassium: 4 mmol/L (ref 3.5–5.2)
Sodium: 139 mmol/L (ref 134–144)
Total Protein: 7.7 g/dL (ref 6.0–8.5)
eGFR: 113 mL/min/{1.73_m2} (ref >59)

## 2023-01-13 LAB — QUANTIFERON-TB GOLD PLUS
QuantiFERON Mitogen Value: >10 IU/mL
QuantiFERON Nil Value: 0.11 IU/mL
QuantiFERON TB1 Ag Value: 0.12 IU/mL
QuantiFERON TB2 Ag Value: 0.11 IU/mL
QuantiFERON-TB Gold Plus: NEGATIVE

## 2023-01-13 LAB — HEPATITIS B SURFACE ANTIGEN: Hepatitis B Surface Ag: NEGATIVE

## 2023-01-13 LAB — HIV ANTIBODY (ROUTINE TESTING W REFLEX): HIV Screen 4th Generation wRfx: NONREACTIVE

## 2023-01-13 LAB — HEPATITIS C ANTIBODY: Hep C Virus Ab: NONREACTIVE

## 2023-01-13 LAB — HEPATITIS B SURFACE ANTIBODY,QUALITATIVE: Hep B Surface Ab, Qual: NONREACTIVE

## 2023-01-13 NOTE — Telephone Encounter (Signed)
Advised pt of labs.  Advised Cosentyx has been sent to [facility].  Pt wanted Dr. Nicole Kindred to know the Minocycline is working well./sh

## 2023-01-13 NOTE — Telephone Encounter (Signed)
-----   Message from Brendolyn Patty, MD sent at 01/13/2023 12:42 PM EST ----- Labs okay, including neg Hep panel, TB, HIV Can send in Rx for Cosentyx as per note  - please call patient

## 2023-01-16 ENCOUNTER — Other Ambulatory Visit: Payer: Self-pay | Admitting: Allergy & Immunology

## 2023-01-20 ENCOUNTER — Other Ambulatory Visit: Payer: Self-pay

## 2023-01-20 ENCOUNTER — Ambulatory Visit (HOSPITAL_COMMUNITY)
Admission: RE | Admit: 2023-01-20 | Discharge: 2023-01-20 | Disposition: A | Discharge status: Home / Self Care | Payer: 59 | Source: Ambulatory Visit | Attending: Family | Admitting: Family

## 2023-01-20 ENCOUNTER — Encounter: Payer: Self-pay | Admitting: Family

## 2023-01-20 ENCOUNTER — Encounter (HOSPITAL_COMMUNITY): Payer: Self-pay | Admitting: Emergency Medicine

## 2023-01-20 ENCOUNTER — Ambulatory Visit (INDEPENDENT_AMBULATORY_CARE_PROVIDER_SITE_OTHER): Payer: 59 | Admitting: Family

## 2023-01-20 ENCOUNTER — Ambulatory Visit (HOSPITAL_COMMUNITY)
Admission: EM | Admit: 2023-01-20 | Discharge: 2023-01-20 | Disposition: A | Discharge status: Home / Self Care | Payer: 59 | Attending: Emergency Medicine | Admitting: Emergency Medicine

## 2023-01-20 ENCOUNTER — Other Ambulatory Visit: Payer: Self-pay | Admitting: Dermatology

## 2023-01-20 VITALS — BP 148/82 | HR 77 | Temp 97.9°F | Resp 24 | Wt 317.1 lb

## 2023-01-20 DIAGNOSIS — M5441 Lumbago with sciatica, right side: Secondary | ICD-10-CM | POA: Diagnosis not present

## 2023-01-20 DIAGNOSIS — L732 Hidradenitis suppurativa: Secondary | ICD-10-CM

## 2023-01-20 DIAGNOSIS — G8929 Other chronic pain: Secondary | ICD-10-CM

## 2023-01-20 DIAGNOSIS — J3089 Other allergic rhinitis: Secondary | ICD-10-CM

## 2023-01-20 DIAGNOSIS — R0602 Shortness of breath: Secondary | ICD-10-CM

## 2023-01-20 DIAGNOSIS — T7800XD Anaphylactic reaction due to unspecified food, subsequent encounter: Secondary | ICD-10-CM | POA: Diagnosis not present

## 2023-01-20 DIAGNOSIS — F17200 Nicotine dependence, unspecified, uncomplicated: Secondary | ICD-10-CM

## 2023-01-20 DIAGNOSIS — J4541 Moderate persistent asthma with (acute) exacerbation: Secondary | ICD-10-CM | POA: Diagnosis not present

## 2023-01-20 MED ORDER — KETOROLAC TROMETHAMINE 30 MG/ML IJ SOLN
30.0000 mg | Freq: Once | INTRAMUSCULAR | Status: AC
  Administered 2023-01-20: 30 mg via INTRAMUSCULAR

## 2023-01-20 MED ORDER — ALBUTEROL SULFATE HFA 108 (90 BASE) MCG/ACT IN AERS: INHALATION_SPRAY | RESPIRATORY_TRACT | 1 refills | Status: DC

## 2023-01-20 MED ORDER — TRELEGY ELLIPTA 200-62.5-25 MCG/ACT IN AEPB: 1.0000 | INHALATION_SPRAY | Freq: Every day | RESPIRATORY_TRACT | 5 refills | Status: DC

## 2023-01-20 MED ORDER — KETOROLAC TROMETHAMINE 30 MG/ML IJ SOLN
INTRAMUSCULAR | Status: AC
  Filled 2023-01-20: qty 1

## 2023-01-20 MED ORDER — OXYCODONE-ACETAMINOPHEN 5-325 MG PO TABS: 1.0000 | ORAL_TABLET | Freq: Four times a day (QID) | ORAL | 0 refills | Status: DC | PRN

## 2023-01-20 MED ORDER — EPINEPHRINE 0.3 MG/0.3ML IJ SOAJ: 0.3000 mg | INTRAMUSCULAR | 1 refills | Status: DC | PRN

## 2023-01-20 MED ORDER — PREDNISONE 10 MG PO TABS: ORAL_TABLET | ORAL | 0 refills | Status: DC

## 2023-01-20 MED ORDER — ALBUTEROL SULFATE (2.5 MG/3ML) 0.083% IN NEBU: 2.5000 mg | INHALATION_SOLUTION | RESPIRATORY_TRACT | 1 refills | Status: DC | PRN

## 2023-01-20 NOTE — Patient Instructions (Addendum)
1. Moderate persistent asthma with acute exacerbation - Start prednisone 10 mg taking 2 tablets twice a day for 3 days, then on the 4th day take 2 tablets in the morning and on the 5th day take one tablet and stop - Stop Symbicort 160/4.5 mcg  - Stop using albuterol via nebulizer and inhaler every 30 minutes - Continue to cut down on your smoking - if your symptoms do not get better or worsen please go to the Emergency Room - We will order a STAT chest x-ray due to your shortness of breath. We will call you with results once they are back - Daily controller medication(s): Trelegy 200/62.5/25 one puff once daily - Prior to physical activity: albuterol 2 puffs 10-15 minutes before physical activity. - Rescue medications: albuterol 4 puffs every 4-6 hours as needed for cough, wheeze, tightness in chest, or shortness of breath - Asthma control goals:  * Full participation in all desired activities (may need albuterol before activity) * Albuterol use two time or less a week on average (not counting use with activity) * Cough interfering with sleep two time or less a month * Oral steroids no more than once a year * No hospitalizations  2. Anaphylactic shock due to food (shellfish) - Avoid shellfish. In case of an allergic reaction, give Benadryl 4 teaspoonfuls every 4 hours, and if life-threatening symptoms occur, inject with EpiPen 0.3 mg. - Please pick up the EpiPen when it is ready    3. Perennial and seasonal allergic rhinitis (grasses, weeds, ragweed, trees, molds, cat, dog, cockroach, and dust mite) - Continue with fluticasone nasal spray two sprays per nostril daily EVERY DAY (brown bottle).  - Continue with levocetirizine '5mg'$  tablet once daily.  - Continue montelukast (Singulair) '10mg'$  daily.    4. Suppurativa hiadrenitis -Continue to follow up with Dr. Frazier Butt - She is at [facility].  Your blood pressure is elevated today in the office. Please schedule an appointment  with your primary care physician to discuss  5. Schedule an appointment in 6 weeks or sooner

## 2023-01-20 NOTE — Discharge Instructions (Addendum)
Follow up with sports medicine. Try one of the clinics before - see which one can see you soonest. Continue taking ibuprofen and muscle relaxer as prescribed.   Don't take tramadol and oxycodone/acetaminophen together. Stop the tramadol for now.

## 2023-01-20 NOTE — Progress Notes (Signed)
[place] [address] Dept: [phone]  FOLLOW UP NOTE  Patient ID: [name], female    DOB: [birth year]  Age: 47 y.o. MRN: [id number] Date of Office Visit: 01/20/2023  Assessment  Chief Complaint: Asthma (Taking all the medications plus took a 7 days of prednisone and it still is not getting better. Nighttime wheezing, SOB, Coughing (dry). )  HPI [name] is a 47 year old female who presents today for an acute visit.  She was last seen November 14, 2022 by Dr. Ernst Bowler for moderate persistent asthma, anaphylactic shock due to food, seasonal and perennial allergic rhinitis, and hydradenitis suppurativa.  She denies any new diagnosis or surgery since her last office visit.  Moderate persistent asthma: She reports for the past 2 weeks she has had a dry cough that is worse at night, wheezing, shortness of breath, tightness in her chest, and nocturnal awakenings due to breathing problems.  She has been using her albuterol 3 puffs every 30 minutes without any relief.  She has also been taking Symbicort 160/4.5 mcg 2 puffs twice a day, Trelegy 1 puff in the morning, and albuterol via her nebulizer and handheld inhaler.  She has been doing this current regimen for 1-1/2 weeks.  She was previously on prednisone 10 mg 2 tablets once a day for 3 days, then 1 tablet once a day for 4 days from her primary care physician.  She finished the prednisone 3 days ago and does not feel like it helped any.  She denies any fever or chills.  Does continue to smoke, but is down to 4 cigarettes a day.  She reports that she used to smoke a pack a day.  Anaphylactic shock due to food: She continues to avoid shellfish without any accidental ingestion.  She reports that the pharmacy did not ever get her EpiPen, but they are supposed to send it next week.  She has not been eating out since she does not have an EpiPen.  Perennial and seasonal allergic rhinitis: She reports a dry nose and  throat.  She does feel like something is in her throat.  She denies sore throat, rhinorrhea, nasal congestion, postnasal drip.  She is currently using fluticasone nasal spray daily and continues to take Xyzal 5 mg daily and montelukast 10 mg daily.  She has not had any sinus infections since we last saw her.  Reflux: She denies heartburn or reflux symptoms, but does mention that she takes a pill that she does not know the name of.  She thinks it may be Nexium. After reviewing Epic she is supposed to be on Nexium 40 mg twice a day  Hidradenitis suppurativa: She reports that she did see Dr. Nicole Kindred as recommended by Dr. Ernst Bowler.  They are trying to get an injectable medication approved.  She reports things are doing good.   Drug Allergies:  Allergies  Allergen Reactions   Shellfish Allergy     Review of Systems: Review of Systems  Constitutional:  Negative for chills and fever.  HENT:         Denies rhinorrhea, nasal congestion, and postnasal drip.  Eyes:        Denies itchy watery eyes  Respiratory:  Positive for cough, shortness of breath and wheezing.        Reports dry cough that is worse at night, wheezing, shortness of breath, tightness in her chest, and nocturnal awakenings due to breathing problems  Cardiovascular:  Negative for chest pain  and palpitations.  Gastrointestinal:        Denies heartburn or reflux symptoms since taking a pill for reflux that she does not know the name of.  She thinks it may be Nexium.  Genitourinary:  Negative for frequency.  Skin:  Negative for itching and rash.  Neurological:  Negative for headaches.  Endo/Heme/Allergies:  Positive for environmental allergies.     Physical Exam: BP (!) 148/82   Pulse 77   Temp 97.9 F (36.6 C) (Temporal)   Resp (!) 24   Wt (!) 317 lb 1.6 oz (143.8 kg)   LMP 01/13/2023   SpO2 98%   BMI 46.83 kg/m    Physical Exam Constitutional:      Appearance: Normal appearance.  HENT:     Head: Normocephalic and  atraumatic.     Comments: Pharynx normal, eyes normal, ears normal, nose: Bilateral lower turbinates moderately edematous and slightly erythematous with no drainage noted    Right Ear: Tympanic membrane, ear canal and external ear normal.     Left Ear: Tympanic membrane, ear canal and external ear normal.     Mouth/Throat:     Mouth: Mucous membranes are moist.     Pharynx: Oropharynx is clear.  Eyes:     Conjunctiva/sclera: Conjunctivae normal.  Cardiovascular:     Rate and Rhythm: Normal rate and regular rhythm.     Heart sounds: Normal heart sounds.  Pulmonary:     Effort: Pulmonary effort is normal.     Breath sounds: Normal breath sounds.     Comments: Rhonchi noted in left lower lobe that clears with coughing.  Otherwise lungs clear to auscultation Musculoskeletal:     Cervical back: Neck supple.  Skin:    General: Skin is warm.  Neurological:     Mental Status: She is alert and oriented to person, place, and time.  Psychiatric:        Mood and Affect: Mood normal.        Behavior: Behavior normal.        Thought Content: Thought content normal.        Judgment: Judgment normal.     Diagnostics: FVC 2.62 L (74%), FEV1 1.84 L (64%) spirometry indicates possible restrictive defect.  Assessment and Plan: 1. Moderate persistent asthma with (acute) exacerbation   2. Anaphylactic shock due to food, subsequent encounter   3. Other allergic rhinitis   4. Hidradenitis suppurativa   5. Current every day smoker   6. Shortness of breath     Meds ordered this encounter  Medications   albuterol (VENTOLIN HFA) 108 (90 Base) MCG/ACT inhaler    Sig: Inhale 2 puffs by mouth every 4 hours as needed for wheezing or shortness of breath    Dispense:  18 g    Refill:  1   albuterol (PROVENTIL) (2.5 MG/3ML) 0.083% nebulizer solution    Sig: Take 3 mLs (2.5 mg total) by nebulization every 4 (four) hours as needed for wheezing or shortness of breath.    Dispense:  75 mL    Refill:  1    EPINEPHrine 0.3 mg/0.3 mL IJ SOAJ injection    Sig: Inject 0.3 mg into the muscle as needed for anaphylaxis.    Dispense:  1 each    Refill:  1    Please dispense generic brand teva or mylan. This is a courtesy refill.Patient needs an appointment for further refills.   Fluticasone-Umeclidin-Vilant (TRELEGY ELLIPTA) 200-62.5-25 MCG/ACT AEPB    Sig: Inhale 1  puff into the lungs daily.    Dispense:  28 each    Refill:  5   predniSONE (DELTASONE) 10 MG tablet    Sig: Take 2 tablets twice a day for 3 days, then on the 4th day take 2 tablets in the morning, and on the 5th day take one tablet and stop    Dispense:  15 tablet    Refill:  0    Patient Instructions  1. Moderate persistent asthma with acute exacerbation - Start prednisone 10 mg taking 2 tablets twice a day for 3 days, then on the 4th day take 2 tablets in the morning and on the 5th day take one tablet and stop - Stop Symbicort 160/4.5 mcg  - Stop using albuterol via nebulizer and inhaler every 30 minutes - Continue to cut down on your smoking - if your symptoms do not get better or worsen please go to the Emergency Room - We will order a STAT chest x-ray due to your shortness of breath. We will call you with results once they are back - Daily controller medication(s): Trelegy 200/62.5/25 one puff once daily - Prior to physical activity: albuterol 2 puffs 10-15 minutes before physical activity. - Rescue medications: albuterol 4 puffs every 4-6 hours as needed for cough, wheeze, tightness in chest, or shortness of breath - Asthma control goals:  * Full participation in all desired activities (may need albuterol before activity) * Albuterol use two time or less a week on average (not counting use with activity) * Cough interfering with sleep two time or less a month * Oral steroids no more than once a year * No hospitalizations  2. Anaphylactic shock due to food (shellfish) - Avoid shellfish. In case of an allergic reaction,  give Benadryl 4 teaspoonfuls every 4 hours, and if life-threatening symptoms occur, inject with EpiPen 0.3 mg. - Please pick up the EpiPen when it is ready    3. Perennial and seasonal allergic rhinitis (grasses, weeds, ragweed, trees, molds, cat, dog, cockroach, and dust mite) - Continue with fluticasone nasal spray two sprays per nostril daily EVERY DAY (brown bottle).  - Continue with levocetirizine '5mg'$  tablet once daily.  - Continue montelukast (Singulair) '10mg'$  daily.    4. Suppurativa hiadrenitis -Continue to follow up with Dr. Frazier Butt - She is at [facility].  Your blood pressure is elevated today in the office. Please schedule an appointment with your primary care physician to discuss  5. Schedule an appointment in 6 weeks or sooner     Return in about 6 weeks (around 03/03/2023), or if symptoms worsen or fail to improve.    Thank you for the opportunity to care for this patient.  Please do not hesitate to contact me with questions.  Althea Charon, FNP [facility]

## 2023-01-20 NOTE — ED Provider Notes (Signed)
[name]    CSN: [id number] Arrival date & time: 01/20/23  0807      History   Chief Complaint Chief Complaint  Patient presents with   Back Pain   Leg Pain    HPI [name] is a 47 y.o. female.   She reports r sided low back pain that radiates to her r buttock and down her rle for the last four months. Has been rx ibuprofen and muscle relaxer for this pain but it's never gone away. This morning at 4am pain woke her up. Ibuprofen at 4am and muscle relaxer at 5:30am did not relieve pain. Denies bowel and bladder changes, Denies LE weakness. All positions hurt but she is bent over exam table and she says this position is the least painful. Today pain is also radiating around to her groin and down to her anterior knee. It feels numb and tingly as well as painful.    Back Pain Associated symptoms: leg pain   Leg Pain Associated symptoms: back pain     Past Medical History:  Diagnosis Date   Asthma    Hydradenitis    MRSA (methicillin resistant staph aureus) culture positive    Obesity     Patient Active Problem List   Diagnosis Date Noted   Graves disease 07/06/2019   Hyperthyroidism 07/06/2019   Current smoker 05/26/2019   Seasonal and perennial allergic rhinitis 03/25/2018   Anaphylactic shock due to adverse food reaction 08/19/2017   Perennial and seasonal allergic rhinitis 08/19/2017   Moderate persistent asthma without complication [id number]    Past Surgical History:  Procedure Laterality Date   CESAREAN SECTION     x2   TUBAL LIGATION     WISDOM TOOTH EXTRACTION  2011    OB History     Gravida  3   Para  2   Term  2   Preterm      AB  1   Living         SAB  1   IAB      Ectopic      Multiple      Live Births               Home Medications    Prior to Admission medications   Medication Sig Start Date End Date Taking? Authorizing Provider  oxyCODONE-acetaminophen (PERCOCET/ROXICET) 5-325 MG tablet Take  1 tablet by mouth every 6 (six) hours as needed for severe pain. 01/20/23  Yes Carvel Getting, NP  albuterol [facility] HFA) [phone] Base) MCG/ACT inhaler Inhale 4 puffs every 4-6 hours as needed 05/01/21   Valentina Shaggy, MD  albuterol (PROVENTIL) (2.5 MG/3ML) 0.083% nebulizer solution Take 3 mLs (2.5 mg total) by nebulization every 4 (four) hours as needed for wheezing or shortness of breath. 01/16/23   Valentina Shaggy, MD  albuterol (VENTOLIN HFA) 108 (90 Base) MCG/ACT inhaler Inhale 2 puffs by mouth every 4 hours as needed for wheezing or shortness of breath 10/30/20   Valentina Shaggy, MD  amitriptyline (ELAVIL) 25 MG tablet Take 25 mg by mouth at bedtime. Patient not taking: Reported on 01/08/2023 05/22/22   [provider]  azelastine (ASTELIN) 0.1 % nasal spray Place 1 spray into both nostrils 2 (two) times daily. Use in each nostril as directed    [provider]  budesonide-formoterol (SYMBICORT) 160-4.5 MCG/ACT inhaler Inhale 2 puffs into the lungs 2 (two) times daily.    [provider]  clindamycin (CLEOCIN T) 1 % external solution Apply to open sores QD after shower. 01/08/23   Brendolyn Patty, MD  cyclobenzaprine (FLEXERIL) 10 MG tablet Take 10 mg by mouth at bedtime.    [provider]  EPINEPHrine 0.3 mg/0.3 mL IJ SOAJ injection Inject 0.3 mg into the muscle as needed for anaphylaxis. Patient not taking: Reported on 01/08/2023 10/11/21   Valentina Shaggy, MD  ergocalciferol (VITAMIN D2) 1.25 MG (50000 UT) capsule Take 50,000 Units by mouth once a week.    [provider]  esomeprazole (NEXIUM) 40 MG capsule Take 1 capsule by mouth twice daily 11/20/22   Valentina Shaggy, MD  ferrous sulfate 325 (65 FE) MG EC tablet Take 325 mg by mouth 3 (three) times daily with meals.    [provider]  fluticasone Asencion Islam) 50 MCG/ACT nasal spray Use 1 spray into each nostril every day Patient not taking: Reported on 01/08/2023  11/14/22   Valentina Shaggy, MD  Fluticasone-Umeclidin-Vilant (TRELEGY ELLIPTA) 200-62.5-25 MCG/ACT AEPB Inhale 1 puff into the lungs daily. 11/14/22   Valentina Shaggy, MD  ibuprofen (ADVIL) 800 MG tablet Take 1 tablet (800 mg total) by mouth every 8 (eight) hours as needed. 01/03/23   Shelly Bombard, MD  levocetirizine Harlow Ohms) 5 MG tablet Take 1 tablet by mouth every day as needed for allergies 11/20/22   Valentina Shaggy, MD  methimazole (TAPAZOLE) 10 MG tablet TAKE 1 AND 1/2 TABLETS DAILY BY MOUTH Patient not taking: Reported on 01/08/2023 02/04/20   Shamleffer, Melanie Crazier, MD  minocycline (MINOCIN) 100 MG capsule Take one cap po QD 01/08/23   Brendolyn Patty, MD  montelukast (SINGULAIR) 10 MG tablet Take 1 tablet (10 mg total) by mouth at bedtime. 11/14/22   Valentina Shaggy, MD  Secukinumab (COSENTYX UNOREADY) 300 MG/2ML SOAJ Inject 1 Pen into the skin every 28 (twenty-eight) days. 01/08/23   Brendolyn Patty, MD  Secukinumab (COSENTYX UNOREADY) 300 MG/2ML SOAJ Inject 1 Pen into the skin every 7 (seven) days. Inject 1 pen SQ QW x 5 weeks. 01/08/23   Brendolyn Patty, MD  Spacer/Aero-Holding Chambers (AEROCHAMBER PLUS) inhaler Use as instructed Patient not taking: Reported on 01/08/2023 08/05/17   Melynda Ripple, MD  traMADol (ULTRAM) 50 MG tablet Take 50 mg by mouth 2 (two) times daily as needed. 07/26/22   [provider]    Family History Family History  Problem Relation Age of Onset   Diabetes Mother    Hypertension Mother    Diabetes Father    Asthma Other    Hyperlipidemia Other    Hypertension Other     Social History Social History   Tobacco Use   Smoking status: Some Days    Packs/day: 0.33    Types: Cigarettes   Smokeless tobacco: Never  Vaping Use   Vaping Use: Never used  Substance Use Topics   Alcohol use: Yes    Comment: once a month at a party beer wine or mixed drinks   Drug use: No     Allergies   Shellfish allergy   Review of  Systems Review of Systems  Musculoskeletal:  Positive for back pain.     Physical Exam Triage Vital Signs ED Triage Vitals  Enc Vitals Group     BP 01/20/23 0825 (!) 159/93     Pulse Rate 01/20/23 0825 70     Resp 01/20/23 0825 18     Temp 01/20/23 0825 98.3 F (36.8 C)  Temp Source 01/20/23 0825 Oral     SpO2 01/20/23 0825 99 %     Weight --      Height --      Head Circumference --      Peak Flow --      Pain Score 01/20/23 0824 10     Pain Loc --      Pain Edu? --      Excl. in [place]? --    No data found.  Updated Vital Signs BP (!) 159/93 (BP Location: Right Arm)   Pulse 70   Temp 98.3 F (36.8 C) (Oral)   Resp 18   LMP 01/13/2023   SpO2 99%   Visual Acuity Right Eye Distance:   Left Eye Distance:   Bilateral Distance:    Right Eye Near:   Left Eye Near:    Bilateral Near:     Physical Exam Constitutional:      Appearance: She is obese.     Comments: Appears in pain  Pulmonary:     Effort: Pulmonary effort is normal.  Musculoskeletal:     Lumbar back: Tenderness present. No edema, deformity or bony tenderness.     Comments: Pain with light touch of R lower back   Neurological:     Mental Status: She is alert and oriented to person, place, and time.     Comments: Gait favors R side      UC Treatments / Results  Labs (all labs ordered are listed, but only abnormal results are displayed) Labs Reviewed - No data to display  EKG   Radiology No results found.  Procedures Procedures (including critical care time)  Medications Ordered in UC Medications  ketorolac (TORADOL) 30 MG/ML injection 30 mg (has no administration in time range)    Initial Impression / Assessment and Plan / UC Course  I have reviewed the triage vital signs and the nursing notes.  Pertinent labs & imaging results that were available during my care of the patient were reviewed by me and considered in my medical decision making (see chart for details).    Discussed  red flags with pt/reasons for seeking ED care. Because she drove today, I cannot give any medication that might be sedating here in UC. Will give ketorolac '30mg'$  IM. Rx percocet. Pt has scheduled appt with [facility] tomorrow at 9:30am.   Final Clinical Impressions(s) / UC Diagnoses   Final diagnoses:  Chronic right-sided low back pain with right-sided sciatica     Discharge Instructions      Follow up with sports medicine. Try one of the clinics before - see which one can see you soonest. Continue taking ibuprofen and muscle relaxer as prescribed.   Don't take tramadol and oxycodone/acetaminophen together. Stop the tramadol for now.     ED Prescriptions     Medication Sig Dispense Auth. Provider   oxyCODONE-acetaminophen (PERCOCET/ROXICET) 5-325 MG tablet Take 1 tablet by mouth every 6 (six) hours as needed for severe pain. 6 tablet Carvel Getting, NP      I have reviewed the PDMP during this encounter.   Carvel Getting, NP 01/20/23 [phone]

## 2023-01-20 NOTE — Progress Notes (Unsigned)
   I, Peterson Lombard, LAT, ATC acting as a scribe for Lynne Leader, MD.  Subjective:    CC: Low back pain  HPI: Pt is a 47 y/o female c/o low back pain ongoing for the past 4 months. She notes pain woke her up from sleep yesterday and she proceed to the [facility] and was given a Toradol IM injection and prescribed oxycodone. Pt locates pain to ***  Radiating pain: LE numbness/tingling: LE weakness: Aggravates: Treatments tried: IBU, muscle relaxer, Toradol IM injection, oxycodone  Pertinent review of Systems: ***  Relevant historical information: ***   Objective:   There were no vitals filed for this visit. General: Well Developed, well nourished, and in no acute distress.   MSK: ***  Lab and Radiology Results No results found for this or any previous visit (from the past 72 hour(s)). DG Chest 2 View  Result Date: 01/20/2023 CLINICAL DATA:  Shortness of breath. EXAM: CHEST - 2 VIEW COMPARISON:  August 05, 2017. FINDINGS: The heart size and mediastinal contours are within normal limits. Left lung is clear. Possible nodular opacity seen in right lower lobe. The visualized skeletal structures are unremarkable. IMPRESSION: Possible nodular opacity seen in right lower lobe. CT scan of the chest is recommended to rule out pulmonary nodule or neoplasm. Electronically Signed   By: Marijo Conception M.D.   On: 01/20/2023 10:56      Impression and Recommendations:    Assessment and Plan: 47 y.o. female with ***.  PDMP not reviewed this encounter. No orders of the defined types were placed in this encounter.  No orders of the defined types were placed in this encounter.   Discussed warning signs or symptoms. Please see discharge instructions. Patient expresses understanding.   ***

## 2023-01-20 NOTE — Progress Notes (Signed)
Attempted to contact patient to discuss chest x-ray results. Left message to return call. Phone number given.  Please let [name] know that her chest x-ray does show a nodular opacity in her right lower lobe. The radiologist recommends scheduling a ct chest to rule out pulmonary nodule or neoplasm.   Please schedule her for a CT chest without contrast due to abnormal chest x-ray and current smoker. You will also need to check for pre authorization.  Also, please refer to Pulmonology due to abnormal chest xray and current smoker.  Thank you, Althea Charon, FNP

## 2023-01-20 NOTE — ED Triage Notes (Signed)
Pt reports that she is having right lower back pain that radiates down right hip to right knee for 4 months. Reports PCP gave her muscle relaxer and GYN gave Ibuprofen 800 mg but both not helping.

## 2023-01-21 ENCOUNTER — Telehealth: Payer: Self-pay

## 2023-01-21 ENCOUNTER — Other Ambulatory Visit: Payer: Self-pay | Admitting: Allergy & Immunology

## 2023-01-21 ENCOUNTER — Ambulatory Visit (INDEPENDENT_AMBULATORY_CARE_PROVIDER_SITE_OTHER): Payer: 59 | Admitting: Family Medicine

## 2023-01-21 ENCOUNTER — Ambulatory Visit (INDEPENDENT_AMBULATORY_CARE_PROVIDER_SITE_OTHER): Payer: 59

## 2023-01-21 ENCOUNTER — Ambulatory Visit: Payer: Self-pay

## 2023-01-21 VITALS — BP 118/82 | HR 76 | Ht 69.0 in | Wt 314.0 lb

## 2023-01-21 DIAGNOSIS — M25551 Pain in right hip: Secondary | ICD-10-CM

## 2023-01-21 DIAGNOSIS — R739 Hyperglycemia, unspecified: Secondary | ICD-10-CM

## 2023-01-21 DIAGNOSIS — G8929 Other chronic pain: Secondary | ICD-10-CM | POA: Diagnosis not present

## 2023-01-21 DIAGNOSIS — M1611 Unilateral primary osteoarthritis, right hip: Secondary | ICD-10-CM | POA: Diagnosis not present

## 2023-01-21 DIAGNOSIS — M5441 Lumbago with sciatica, right side: Secondary | ICD-10-CM

## 2023-01-21 DIAGNOSIS — E0500 Thyrotoxicosis with diffuse goiter without thyrotoxic crisis or storm: Secondary | ICD-10-CM | POA: Diagnosis not present

## 2023-01-21 LAB — URIC ACID: Uric Acid, Serum: 3.5 mg/dL (ref 2.4–7.0)

## 2023-01-21 LAB — TSH: TSH: 1.07 u[IU]/mL (ref 0.35–5.50)

## 2023-01-21 LAB — HEMOGLOBIN A1C: Hgb A1c MFr Bld: 4.9 % (ref 4.6–6.5)

## 2023-01-21 MED ORDER — TIZANIDINE HCL 4 MG PO TABS: 4.0000 mg | ORAL_TABLET | Freq: Every day | ORAL | 1 refills | Status: DC

## 2023-01-21 MED ORDER — COSENTYX UNOREADY 300 MG/2ML ~~LOC~~ SOAJ: 1.0000 | SUBCUTANEOUS | 0 refills | Status: DC

## 2023-01-21 NOTE — Telephone Encounter (Signed)
Updated loading dose for pharmacy. aw

## 2023-01-21 NOTE — Patient Instructions (Addendum)
Thank you for coming in today.   Please get labs today before you leave   You received an injection today. Seek immediate medical attention if the joint becomes red, extremely painful, or is oozing fluid.   I've referred you to Physical Therapy.  Let us know if you don't hear from them in one week.  Check back in 1 month

## 2023-01-22 NOTE — Progress Notes (Signed)
Labs look great.  Thyroid labs look normal.  No evidence of diabetes and uric acid test for gout is normal.

## 2023-01-22 NOTE — Progress Notes (Signed)
Lumbar spine x-ray does show some arthritis changes in the back.

## 2023-01-22 NOTE — Progress Notes (Signed)
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  You do have some arthritis on the left but it is less bad.

## 2023-01-23 ENCOUNTER — Ambulatory Visit: Payer: 59 | Admitting: Obstetrics & Gynecology

## 2023-01-23 ENCOUNTER — Telehealth: Payer: Self-pay

## 2023-01-23 ENCOUNTER — Ambulatory Visit
Admission: RE | Admit: 2023-01-23 | Discharge: 2023-01-23 | Disposition: A | Discharge status: Home / Self Care | Payer: 59 | Source: Ambulatory Visit | Attending: Family | Admitting: Family

## 2023-01-23 DIAGNOSIS — R918 Other nonspecific abnormal finding of lung field: Secondary | ICD-10-CM

## 2023-01-23 NOTE — Telephone Encounter (Signed)
Per Althea Charon, FNP:  Please refer to Pulmonology due to abnormal chest xray and current smoker.

## 2023-01-24 ENCOUNTER — Other Ambulatory Visit: Payer: Self-pay | Admitting: *Deleted

## 2023-01-24 ENCOUNTER — Other Ambulatory Visit: Payer: Self-pay

## 2023-01-24 ENCOUNTER — Telehealth: Payer: Self-pay | Admitting: Family Medicine

## 2023-01-24 MED ORDER — ALBUTEROL SULFATE HFA 108 (90 BASE) MCG/ACT IN AERS: INHALATION_SPRAY | RESPIRATORY_TRACT | 1 refills | Status: DC

## 2023-01-24 MED ORDER — EPINEPHRINE 0.3 MG/0.3ML IJ SOAJ: 0.3000 mg | INTRAMUSCULAR | 1 refills | Status: DC | PRN

## 2023-01-24 NOTE — Telephone Encounter (Signed)
Patient stopped by the office. She was here on 01/21/23 and had an injection. She said that the injection has already worn off. She was previously seen in the ED and was given Percocet which she has been taking and is the only thing that seems to be helping her pain.  Please advise.

## 2023-01-27 NOTE — Telephone Encounter (Signed)
Forwarding to Dr. Georgina Snell at [facility].

## 2023-01-27 NOTE — Progress Notes (Signed)
Please let [name] know that the nodular density seen on the chest x-ray corresponds to the right lung base scarring. This is good news! It did not show any acute intrathoracic pathology. She can cancel her appointment with pulmonology if she would like.(If the referral has already been made)  It did show mild coronary vascular calcification of the LAD and aortic atherosclerosis. Please send a copy of this CT to her primary care physician.

## 2023-01-28 ENCOUNTER — Other Ambulatory Visit: Payer: Self-pay

## 2023-01-28 DIAGNOSIS — G8929 Other chronic pain: Secondary | ICD-10-CM

## 2023-01-28 NOTE — Telephone Encounter (Signed)
Spoke to pt and relayed this message. Pt requested a referral to pain management for possible rx. Pt noted that she is working hard on weight loss and is set up to see Jeralyn Ruths to establish PCP next month. I advised that she speak to her about weight loss assistance and/or possible rx.

## 2023-01-28 NOTE — Telephone Encounter (Signed)
The only thing that will work for her hip pain is a hip replacement.  Pain medicines like Percocet are good for short-term pain but not good for long-term pain.  I cannot prescribe that medicine on an ongoing basis.  I can refer her to a pain management doctor who could prescribe Percocet.  Weight loss is essential to get BMI under 40 so that she can have a hip replacement.

## 2023-01-29 ENCOUNTER — Other Ambulatory Visit: Payer: Self-pay | Admitting: Internal Medicine

## 2023-01-29 ENCOUNTER — Ambulatory Visit (INDEPENDENT_AMBULATORY_CARE_PROVIDER_SITE_OTHER): Payer: 59 | Admitting: Family Medicine

## 2023-01-29 ENCOUNTER — Encounter: Payer: Self-pay | Admitting: Family Medicine

## 2023-01-29 VITALS — BP 138/80 | HR 82 | Ht 67.91 in | Wt 327.4 lb

## 2023-01-29 DIAGNOSIS — Z3189 Encounter for other procreative management: Secondary | ICD-10-CM | POA: Diagnosis not present

## 2023-01-29 DIAGNOSIS — Z79891 Long term (current) use of opiate analgesic: Secondary | ICD-10-CM | POA: Insufficient documentation

## 2023-01-29 DIAGNOSIS — N926 Irregular menstruation, unspecified: Secondary | ICD-10-CM | POA: Diagnosis not present

## 2023-01-29 NOTE — Progress Notes (Signed)
Pt reports having heavier bleeding with her recent cycle. She states she had bigger clots than normal, and the bleeding was "watery". Pt also reports having more cramping with this cycle than normal and it lasted longer. No other concerns at this time.

## 2023-01-29 NOTE — Assessment & Plan Note (Signed)
Patient reports that she has had regular periods lasting similar time bleeding approximately the same out for years but her last period was a little bit heavier than they normally are and lasted a little bit longer.  She reports that her bleeding is now stopped and she has no concerns regarding the bleeding at this time.  Denies any signs or symptoms of anemia.  Discussed continuing monitoring and supportive care and if need for further evaluation she can return.  Patient is agreeable to this.

## 2023-01-29 NOTE — Progress Notes (Signed)
    SUBJECTIVE:   CHIEF COMPLAINT / HPI:   Abnormal menses Patient is being evaluated for abnormal uterine bleeding because her last period was longer and heavier than they usually are.  She reports passing a few clots but the bleeding stopped and has been fine since her last period.  She and her partner are considering having children and she wanted to make sure it was okay if she had an abnormal period previously.  See below regarding fertility.  Denies any dizziness, weakness, lightheadedness.  Desire for fertility Patient reports she and her partner are wanting to have another child.  She has already had a tubal ligation.  Discussed that she may be having menopausal symptoms but she would still like information on who to talk to regarding assistance with  OBJECTIVE:   BP 138/80   Pulse 82   Ht 5' 7.91" (1.725 m)   Wt (!) 327 lb 6.4 oz (148.5 kg)   LMP 01/13/2023   BMI 49.91 kg/m   General: Well-appearing 47 year old female in no acute distress Cardiac: Regular rate and rhythm Respiratory: Normal breathing, speaking full sentences Abdomen: Soft, nontender, no organomegaly, no tenderness MSK: No gross abnormalities, ambulates without difficulty   ASSESSMENT/PLAN:   Irregular periods Patient reports that she has had regular periods lasting similar time bleeding approximately the same out for years but her last period was a little bit heavier than they normally are and lasted a little bit longer.  She reports that her bleeding is now stopped and she has no concerns regarding the bleeding at this time.  Denies any signs or symptoms of anemia.  Discussed continuing monitoring and supportive care and if need for further evaluation she can return.  Patient is agreeable to this.  Encounter for fertility planning Patient has history of tubal ligation.  She is interested in pregnancy with her partner at this time.  Concerned that she may be showing some perimenopausal symptoms.  Discussed  with her but patient would like to speak with infertility specialist.  Provided contact information for [place] [facility].  No further questions or concerns.Concepcion Living, MD [place]

## 2023-01-29 NOTE — Assessment & Plan Note (Signed)
Patient has history of tubal ligation.  She is interested in pregnancy with her partner at this time.  Concerned that she may be showing some perimenopausal symptoms.  Discussed with her but patient would like to speak with infertility specialist.  Provided contact information for [place] [facility].  No further questions or concerns.Marland Kitchen

## 2023-01-31 ENCOUNTER — Other Ambulatory Visit: Payer: Self-pay | Admitting: Dermatology

## 2023-01-31 ENCOUNTER — Other Ambulatory Visit: Payer: Self-pay | Admitting: Allergy & Immunology

## 2023-01-31 DIAGNOSIS — L732 Hidradenitis suppurativa: Secondary | ICD-10-CM

## 2023-02-05 ENCOUNTER — Ambulatory Visit (INDEPENDENT_AMBULATORY_CARE_PROVIDER_SITE_OTHER): Payer: 59 | Admitting: Dermatology

## 2023-02-05 DIAGNOSIS — L732 Hidradenitis suppurativa: Secondary | ICD-10-CM

## 2023-02-05 MED ORDER — MINOCYCLINE HCL 100 MG PO CAPS: ORAL_CAPSULE | ORAL | 3 refills | Status: DC

## 2023-02-05 MED ORDER — FLUCONAZOLE 150 MG PO TABS: 150.0000 mg | ORAL_TABLET | Freq: Every day | ORAL | 4 refills | Status: DC

## 2023-02-05 NOTE — Therapy (Signed)
OUTPATIENT PHYSICAL THERAPY THORACOLUMBAR EVALUATION   Patient Name: [name] MRN: [id number] DOB:1975-12-24, 47 y.o., female Today's Date: 02/06/2023  END OF SESSION:  PT End of Session - 02/06/23 1027     Visit Number 1    Number of Visits 16    Date for PT Re-Evaluation 04/03/23    Authorization Type [employer] MCR FOTO 6th and 10th visit    Progress Note Due on Visit 10    PT Start Time [id number]    PT Stop Time 1100    PT Time Calculation (min) 45 min    Activity Tolerance Patient tolerated treatment well;Patient limited by pain    Behavior During Therapy [facility] for tasks assessed/performed             Past Medical History:  Diagnosis Date   Asthma    Hydradenitis    MRSA (methicillin resistant staph aureus) culture positive    Obesity    Past Surgical History:  Procedure Laterality Date   CESAREAN SECTION     x2   TUBAL LIGATION     WISDOM TOOTH EXTRACTION  2011   Patient Active Problem List   Diagnosis Date Noted   Irregular periods 01/29/2023   Encounter for fertility planning 01/29/2023   Primary osteoarthritis of right hip 01/21/2023   Graves disease 07/06/2019   Hyperthyroidism 07/06/2019   Current smoker 05/26/2019   Seasonal and perennial allergic rhinitis 03/25/2018   Anaphylactic shock due to adverse food reaction 08/19/2017   Perennial and seasonal allergic rhinitis 08/19/2017   Moderate persistent asthma without complication [id number]    PCP: System, Provider Not In   REFERRING PROVIDER: Gregor Hams, MD   REFERRING DIAG: [phone] (ICD-10-CM) - Chronic right-sided low back pain with right-sided sciatica   Rationale for Evaluation and Treatment: Rehabilitation  THERAPY DIAG:  Chronic right-sided low back pain, unspecified whether sciatica present - Plan: PT plan of care cert/re-cert  Muscle weakness (generalized) - Plan: PT plan of care cert/re-cert  Pain in right hip - Plan: PT plan of care cert/re-cert  Difficulty in walking,  not elsewhere classified - Plan: PT plan of care cert/re-cert  Abnormal posture - Plan: PT plan of care cert/re-cert  ONSET DATE: Low back and hip pain ongoing last 4-5 months  SUBJECTIVE:                                                                                                                                                                                           SUBJECTIVE STATEMENT: My doctor sent me here for my back, I have problems with my RT hip but I can't have surgery  until I lose 40 pounds.  I have been having back problems for a long time 3 years but has worsened in the last 3 months.  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 I used to work [employer] in [employer].  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 I cannot leave [name] alone, she must be with me at all times.  PERTINENT HISTORY:  Asthma, MRSA +, obesity, Grave's dz, hydrdenitis  PAIN:  Are you having pain? Yes: NPRS scale: 7/10 at rest and 11/ 10 at worst/10 Pain location: lower back into RT  buttock and stays Pain description: sharp pain in lower middle in the center and it radiating into my RT hip Aggravating factors: If I stand more than 10 minutes, going up steps my back is burning. Relieving factors: nothing Holding babies hard, no more than 5 minutes standing, I cannot sleep on back at night PRECAUTIONS: None shellfish allergy  WEIGHT BEARING RESTRICTIONS: No  FALLS:  Has patient fallen in last 6 months? No  LIVING ENVIRONMENT: Lives with: lives with their family Lives in: House/apartment Stairs: Yes: External: 3 +15 steps; on left going up Has following equipment at home: None  OCCUPATION: Retired  PLOF: Independent  PATIENT GOALS: I have 3 natural grandsons and a grand daughter  NEXT MD VISIT: April 2nd  OBJECTIVE:   DIAGNOSTIC FINDINGS:  FINDINGS: Please note there are 4 non-rib-bearing lumbar vertebra. The lower most lumbar  vertebra will be labeled L5. Insert straightening of normal lordosis. There is trace anterolisthesis of L4 on L5 that is likely facet mediated. Moderate disc space narrowing and spurring at L5-S1, mild disc space narrowing and spurring at L4-L5. Moderate L4-L5 and L5-S1 facet hypertrophy. Normal vertebral body heights. No evidence of fracture or focal bone abnormalities. Degenerative change of both sacroiliac joints. Soft tissue attenuation from habitus limits detailed assessment.   IMPRESSION: 1. 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 2. Trace anterolisthesis of L4 on L5, likely facet mediated. 3. Please note there are 4 non-rib-bearing lumbar vertebra, the lower most lumbar vertebra was labeled L5.     Electronically Signed   By: Keith Rake M.D.   On: 01/22/2023 12:45  FINDINGS: There is acetabular over coverage. Osseous bump at the femoral head neck junction. Moderate right hip joint space narrowing and spurring. Acetabular sclerosis may be degenerative. No evidence of fracture, erosion or avascular necrosis. Left hip joint space narrowing and spurring to a lesser extent. Mild degenerative change of the sacroiliac joints and pubic symphysis.   IMPRESSION: 1. 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 2. Right hip acetabular over coverage may contribute to impingement.     Electronically Signed   By: Keith Rake M.D.   On: 01/22/2023 12:43  PATIENT SURVEYS:  FOTO 34% predicted  56%    SCREENING FOR RED FLAGS: Bowel or bladder incontinence: No Cauda equina syndrome: No   COGNITION: Overall cognitive status: Within functional limits for tasks assessed     SENSATION: WFL  MUSCLE LENGTH: Hamstrings: Right 60 deg; Left 60 deg Thomas test: Right -30 deg; Left 22 deg  POSTURE: rounded shoulders, forward head, and anterior pelvic tilt  pelvic level higher my 2  finger widths on RT Obesity, and excess adipose tissue /pannus PALPATION: TTP over RT QL   LUMBAR ROM:   AROM eval  Flexion Fingertips to midshin no  pain  Extension 30 with pinching pain on the RT  Right lateral flexion Pain with concordant RT back pain  Left lateral flexion Finger to knee jt line  Right rotation 50% pain  Left rotation 50%   (Blank rows = not tested)  LOWER EXTREMITY ROM:     Active  Right eval Left eval  Hip flexion 60 60  Hip extension    Hip abduction    Hip adduction    Hip internal rotation 18 p! 21  Hip external rotation 19p! 25  Knee flexion    Knee extension    Ankle dorsiflexion    Ankle plantarflexion    Ankle inversion    Ankle eversion     (Blank rows = not tested)  LOWER EXTREMITY MMT:   Pain with Rt hip motion  MMT Right eval Left eval  Hip flexion 3- 3-  Hip extension 3- 4-  Hip abduction 3- 3-  Hip adduction    Hip internal rotation    Hip external rotation    Knee flexion 4 4  Knee extension 4 4  Ankle dorsiflexion    Ankle plantarflexion    Ankle inversion    Ankle eversion     (Blank rows = not tested)  LUMBAR SPECIAL TESTS:  Straight leg raise test: Negative and Slump test: Negative  FUNCTIONAL TESTS:  5 times sit to stand: 30.4 sec 6 minute walk test: TBD  GAIT: Distance walked: 150 ft Assistive device utilized: None Level of assistance: Complete Independence Comments: antalgic gait on RT  TODAY'S TREATMENT: 02-06-23  eval, issue HEP and TPDN                                                                                                                             DATE: Trigger Point Dry Needling Treatment: Pre-treatment instruction: Patient instructed on dry needling rationale, procedures, and possible side effects including pain during treatment (achy,cramping feeling), bruising, drop of blood, lightheadedness, nausea, sweating. Patient Consent Given: Yes Education handout provided: Yes Muscles treated: RT  QL  Needle size and number:  105 x .38m Electrical stimulation performed: No Parameters: N/A Treatment response/outcome: Twitch response elicited and Palpable decrease in muscle tension Post-treatment instructions: Patient instructed to expect possible mild to moderate muscle soreness later today and/or tomorrow. Patient instructed in methods to reduce muscle soreness and to continue prescribed HEP. If patient was dry needled over the lung field, patient was instructed on signs and symptoms of pneumothorax and, however unlikely, to see immediate medical attention should they occur. Patient was also educated on signs and symptoms of infection and to seek medical attention should they occur. Patient verbalized understanding of these instructions and education.    Asterisk Signs Eval 02-06-23          5 x  STS 30.4 sec            Max pain 10/10 verbalized 11/10  6MWT                                           PATIENT EDUCATION:  Education details: POC, Informed consent,TPDN, FOTO review and issue HEP Person educated: Patient and pt is gaurdian of mother with two children present she must oversee legally Education method: Explanation, Demonstration, Tactile cues, Verbal cues, and Handouts Education comprehension: verbalized understanding, returned demonstration, verbal cues required, tactile cues required, and needs further education  HOME EXERCISE PROGRAM: Access Code: [email or web address] URL: [email or web address] Date: 02/06/2023 Prepared by: Voncille Lo  Exercises - Supine Single Knee to Chest Stretch  - 2 x daily - 7 x weekly - 1 sets - 5 reps - 10 hold - Supine Lower Trunk Rotation  - 2 x daily - 7 x weekly - 1 sets - 5 reps - 20 hold  ASSESSMENT:  CLINICAL IMPRESSION: Patient is a 47 y.o. female who was seen today for physical therapy evaluation and treatment for chronic RT side back pain with radiculopathy into buttocks. Pt also has RT hip pain that radiates into  groin and is hoping for eventual THA once she loses 40 pounds. Pt would benefit from aquatics and skilled PT to address impairments that are affecting daily life, ability to negotiate step and care for young children in her home.   OBJECTIVE IMPAIRMENTS: difficulty walking, decreased ROM, decreased strength, hypomobility, impaired flexibility, improper body mechanics, postural dysfunction, obesity, and pain.   ACTIVITY LIMITATIONS: carrying, lifting, bending, standing, squatting, sleeping, stairs, transfers, locomotion level, and caring for others  PARTICIPATION LIMITATIONS: meal prep, cleaning, laundry, shopping, community activity, and caring for others  PERSONAL FACTORS: Asthma, MRSA +, obesity, Grave's dz, hydrdenitis are also affecting patient's functional outcome.   REHAB POTENTIAL: Good  CLINICAL DECISION MAKING: Evolving/moderate complexity  EVALUATION COMPLEXITY: Moderate   GOALS: Goals reviewed with patient? Yes  SHORT TERM GOALS: Target date: 03-06-23  Pt will be independent with initial HEP Baseline:no knowledge Goal status: INITIAL  2.  Pt with 7/10 pain at rest. Baseline: 7/10  Goal status: INITIAL  3.  Demonstrate understanding of neutral posture and be more conscious of position and posture throughout the day.  Baseline: no knowledge Goal status: INITIAL    LONG TERM GOALS: Target date: 04-03-23  Pt will be able to stand for 30 minutes to care for infant grandchildren  Baseline: unable to stand for 5 min Goal status: INITIAL  2.  Pt will be able to perform household chores with pain level 3/10 or less Baseline: unable to stand for 5 min, unable to do chores Goal status: INITIAL  3.  Pt will be able to sleep for 5 or more hours consecutive sleep Baseline: waking every 2 hours Goal status: INITIAL  4.  Pt will be able to get on ground to play and care for grandchildren Baseline: unable to transfer and rise from ground Goal status: INITIAL  5.  FOTO  will improve from  34%  to   56%  indicating improved functional mobility.  Baseline: eval 34 % Goal status: INITIAL  6.  Demonstrate 4/5 RT LE strength to improve stability, safety and endurance of community level function Baseline: Pt with demonstrated weakness with 30 sec 5 x STS Goal status: INITIAL  7. Pt will be able to negotiate steps with pain 3/10 or less  Baseline: Pt unable to negotiate steps without 10/10 pain  Goal Status, INITIAL  PLAN:  PT FREQUENCY: 1-2x/week  PT DURATION: 8 weeks  PLANNED INTERVENTIONS: Therapeutic exercises, Therapeutic activity, Neuromuscular re-education, Balance training, Gait training, Patient/Family education, Self Care, Joint mobilization, Stair training, Aquatic Therapy, Dry Needling, Electrical stimulation, Spinal mobilization, Cryotherapy, Moist heat, Taping, Ionotophoresis '4mg'$ /ml Dexamethasone, Manual therapy, and Re-evaluation.  PLAN FOR NEXT SESSION: Assess TPDN and review and progress exercise   Voncille Lo, PT, [facility] Certified Exercise Expert for the Aging Adult  02/06/23 1:28 PM Phone: [phone] Fax: [phone]

## 2023-02-05 NOTE — Patient Instructions (Signed)
Due to recent changes in healthcare laws, you may see results of your pathology and/or laboratory studies on MyChart before the doctors have had a chance to review them. We understand that in some cases there may be results that are confusing or concerning to you. Please understand that not all results are received at the same time and often the doctors may need to interpret multiple results in order to provide you with the best plan of care or course of treatment. Therefore, we ask that you please give us 2 business days to thoroughly review all your results before contacting the office for clarification. Should we see a critical lab result, you will be contacted sooner.   If You Need Anything After Your Visit  If you have any questions or concerns for your doctor, please call our main line at [phone] and press option 4 to reach your doctor's medical assistant. If no one answers, please leave a voicemail as directed and we will return your call as soon as possible. Messages left after 4 pm will be answered the following business day.   You may also send us a message via MyChart. We typically respond to MyChart messages within 1-2 business days.  For prescription refills, please ask your pharmacy to contact our office. Our fax number is [phone].  If you have an urgent issue when the clinic is closed that cannot wait until the next business day, you can page your doctor at the number below.    Please note that while we do our best to be available for urgent issues outside of office hours, we are not available 24/7.   If you have an urgent issue and are unable to reach us, you may choose to seek medical care at your doctor's office, retail clinic, urgent care center, or emergency room.  If you have a medical emergency, please immediately call 911 or go to the emergency department.  Pager Numbers  - Dr. Kowalski: [phone]  - Dr. Moye: [phone]  - Dr. Stewart:  [phone]  In the event of inclement weather, please call our main line at [phone] for an update on the status of any delays or closures.  Dermatology Medication Tips: Please keep the boxes that topical medications come in in order to help keep track of the instructions about where and how to use these. Pharmacies typically print the medication instructions only on the boxes and not directly on the medication tubes.   If your medication is too expensive, please contact our office at [phone] option 4 or send us a message through MyChart.   We are unable to tell what your co-pay for medications will be in advance as this is different depending on your insurance coverage. However, we may be able to find a substitute medication at lower cost or fill out paperwork to get insurance to cover a needed medication.   If a prior authorization is required to get your medication covered by your insurance company, please allow us 1-2 business days to complete this process.  Drug prices often vary depending on where the prescription is filled and some pharmacies may offer cheaper prices.  The website www.goodrx.com contains coupons for medications through different pharmacies. The prices here do not account for what the cost may be with help from insurance (it may be cheaper with your insurance), but the website can give you the price if you did not use any insurance.  - You can print the associated coupon and take it with   your prescription to the pharmacy.  - You may also stop by our office during regular business hours and pick up a GoodRx coupon card.  - If you need your prescription sent electronically to a different pharmacy, notify our office through [place] MyChart or by phone at [phone] option 4.     Si Usted Necesita Algo Despus de Su Visita  Tambin puede enviarnos un mensaje a travs de MyChart. Por lo general respondemos a los mensajes de MyChart en el transcurso de 1 a 2  das hbiles.  Para renovar recetas, por favor pida a su farmacia que se ponga en contacto con nuestra oficina. Nuestro nmero de fax es el [phone].  Si tiene un asunto urgente cuando la clnica est cerrada y que no puede esperar hasta el siguiente da hbil, puede llamar/localizar a su doctor(a) al nmero que aparece a continuacin.   Por favor, tenga en cuenta que aunque hacemos todo lo posible para estar disponibles para asuntos urgentes fuera del horario de oficina, no estamos disponibles las 24 horas del da, los 7 das de la semana.   Si tiene un problema urgente y no puede comunicarse con nosotros, puede optar por buscar atencin mdica  en el consultorio de su doctor(a), en una clnica privada, en un centro de atencin urgente o en una sala de emergencias.  Si tiene una emergencia mdica, por favor llame inmediatamente al 911 o vaya a la sala de emergencias.  Nmeros de bper  - Dr. Kowalski: [phone]  - Dra. Moye: [phone]  - Dra. Stewart: [phone]  En caso de inclemencias del tiempo, por favor llame a nuestra lnea principal al [phone] para una actualizacin sobre el estado de cualquier retraso o cierre.  Consejos para la medicacin en dermatologa: Por favor, guarde las cajas en las que vienen los medicamentos de uso tpico para ayudarle a seguir las instrucciones sobre dnde y cmo usarlos. Las farmacias generalmente imprimen las instrucciones del medicamento slo en las cajas y no directamente en los tubos del medicamento.   Si su medicamento es muy caro, por favor, pngase en contacto con nuestra oficina llamando al [phone] y presione la opcin 4 o envenos un mensaje a travs de MyChart.   No podemos decirle cul ser su copago por los medicamentos por adelantado ya que esto es diferente dependiendo de la cobertura de su seguro. Sin embargo, es posible que podamos encontrar un medicamento sustituto a menor costo o llenar un formulario para que el  seguro cubra el medicamento que se considera necesario.   Si se requiere una autorizacin previa para que su compaa de seguros cubra su medicamento, por favor permtanos de 1 a 2 das hbiles para completar este proceso.  Los precios de los medicamentos varan con frecuencia dependiendo del lugar de dnde se surte la receta y alguna farmacias pueden ofrecer precios ms baratos.  El sitio web www.goodrx.com tiene cupones para medicamentos de diferentes farmacias. Los precios aqu no tienen en cuenta lo que podra costar con la ayuda del seguro (puede ser ms barato con su seguro), pero el sitio web puede darle el precio si no utiliz ningn seguro.  - Puede imprimir el cupn correspondiente y llevarlo con su receta a la farmacia.  - Tambin puede pasar por nuestra oficina durante el horario de atencin regular y recoger una tarjeta de cupones de GoodRx.  - Si necesita que su receta se enve electrnicamente a una farmacia diferente, informe a nuestra oficina a travs de MyChart de [place]   o por telfono llamando al [phone] y presione la opcin 4.  

## 2023-02-05 NOTE — Progress Notes (Signed)
   Follow-Up Visit   Subjective  SMT. LODER is a 47 y.o. female who presents for the following: Follow-up.  Patient presents for 1 month follow-up HS. She is improving on minocycline daily and using clindamycin lotion daily after shower. No side effects from oral med. She is still waiting on approval from [facility] for Cosentyx injections.   The following portions of the chart were reviewed this encounter and updated as appropriate:       Review of Systems:  No other skin or systemic complaints except as noted in HPI or Assessment and Plan.  Objective  Well appearing patient in no apparent distress; mood and affect are within normal limits.  A focused examination was performed including breasts, abdomen. Relevant physical exam findings are noted in the Assessment and Plan.  breasts, inframammary Ulcerated papule on the right inf breast with drainage; numerous hyperpigmented scarred macules and nodules and plaques of the upper abdomen and breasts.    Assessment & Plan  Hidradenitis suppurativa breasts, inframammary  Severe with scarring. Chronic and persistent condition with duration or expected duration over one year. Condition is symptomatic / bothersome to patient. Not to goal.  Some improvement with PO minocycline/topical clindamycin.  Awaiting insurance approval for Cosentyx.  Hidradenitis Suppurativa is a chronic; persistent; non-curable, but treatable condition due to abnormal inflamed sweat glands in the body folds (axilla, inframammary, groin, medial thighs), causing recurrent painful draining cysts and scarring. It can be associated with severe scarring acne and cysts; also abscesses and scarring of scalp. The goal is control and prevention of flares, as it is not curable. Scars are permanent and can be thickened. Treatment may include daily use of topical medication and oral antibiotics.  Oral isotretinoin may also be helpful.  For some cases, Humira or Cosentyx  (biologic injections) may be prescribed to decrease the inflammatory process and prevent flares.  When indicated, inflamed cysts may also be treated surgically.  Increase minocycline 100 MG take 1-2 po BID dsp #60 3Rf.  Continue clindamycin lotion QD after shower.   Will start Cosentyx injections pending insurance approval. Rx has already been sent to [facility].   Patient has a history of yeast infections with oral antibiotic use. Start Fluconazole 150 take 1 po as directed dsp #1 4Rf  fluconazole (DIFLUCAN) 150 MG tablet - breasts, inframammary Take 1 tablet (150 mg total) by mouth daily.  Related Medications clindamycin (CLEOCIN T) 1 % external solution Apply to open sores QD after shower.  Secukinumab (COSENTYX UNOREADY) 300 MG/2ML SOAJ Inject 1 Pen into the skin every 28 (twenty-eight) days.  Secukinumab (COSENTYX UNOREADY) 300 MG/2ML SOAJ Inject 1 Pen into the skin every 7 (seven) days. Inject 1 pen SQ QW x 5 weeks.  minocycline (MINOCIN) 100 MG capsule Take 1-2 capsules by mouth daily.   Return in about 1 month (around 03/08/2023) for HS, Cosentyx.  Documentation: I have reviewed the above documentation for accuracy and completeness, and I agree with the above.  Brendolyn Patty MD

## 2023-02-06 ENCOUNTER — Ambulatory Visit: Payer: 59 | Attending: Family Medicine | Admitting: Physical Therapy

## 2023-02-06 ENCOUNTER — Other Ambulatory Visit: Payer: Self-pay

## 2023-02-06 DIAGNOSIS — M6281 Muscle weakness (generalized): Secondary | ICD-10-CM

## 2023-02-06 DIAGNOSIS — R293 Abnormal posture: Secondary | ICD-10-CM | POA: Diagnosis present

## 2023-02-06 DIAGNOSIS — M5441 Lumbago with sciatica, right side: Secondary | ICD-10-CM | POA: Diagnosis not present

## 2023-02-06 DIAGNOSIS — R262 Difficulty in walking, not elsewhere classified: Secondary | ICD-10-CM | POA: Diagnosis present

## 2023-02-06 DIAGNOSIS — M25551 Pain in right hip: Secondary | ICD-10-CM | POA: Diagnosis present

## 2023-02-06 DIAGNOSIS — G8929 Other chronic pain: Secondary | ICD-10-CM | POA: Diagnosis present

## 2023-02-06 DIAGNOSIS — M545 Low back pain, unspecified: Secondary | ICD-10-CM | POA: Insufficient documentation

## 2023-02-06 NOTE — Patient Instructions (Addendum)
Trigger Point Dry Needling  What is Trigger Point Dry Needling (DN)? DN is a physical therapy technique used to treat muscle pain and dysfunction. Specifically, DN helps deactivate muscle trigger points (muscle knots).  A thin filiform needle is used to penetrate the skin and stimulate the underlying trigger point. The goal is for a local twitch response (LTR) to occur and for the trigger point to relax. No medication of any kind is injected during the procedure.   What Does Trigger Point Dry Needling Feel Like?  The procedure feels different for each individual patient. Some patients report that they do not actually feel the needle enter the skin and overall the process is not painful. Very mild bleeding may occur. However, many patients feel a deep cramping in the muscle in which the needle was inserted. This is the local twitch response.   How Will I feel after the treatment? Soreness is normal, and the onset of soreness may not occur for a few hours. Typically this soreness does not last longer than two days.  Bruising is uncommon, however; ice can be used to decrease any possible bruising.  In rare cases feeling tired or nauseous after the treatment is normal. In addition, your symptoms may get worse before they get better, this period will typically not last longer than 24 hours.   What Can I do After My Treatment? Increase your hydration by drinking more water for the next 24 hours. You may place ice or heat on the areas treated that have become sore, however, do not use heat on inflamed or bruised areas. Heat often brings more relief post needling. You can continue your regular activities, but vigorous activity is not recommended initially after the treatment for 24 hours. DN is best combined with other physical therapy such as strengthening, stretching, and other therapies.    Access Code: [email or web address] URL: [email or web address] Date: 02/06/2023 Prepared by: Voncille Lo  Exercises - Supine Single Knee to Chest Stretch  - 2 x daily - 7 x weekly - 1 sets - 5 reps - 10 hold - Supine Lower Trunk Rotation  - 2 x daily - 7 x weekly - 1 sets - 5 reps - 20 hold Aquatic Therapy at [facility]-  What to Expect!  Where:   [facility] @ [address], [place] [address] Rehab phone [phone]  NOTE:  You will receive an automated phone message reminding you of your appt and it will say the appointment is at the [place] clinic.          How to Prepare: Please make sure you drink 8 ounces of water about one hour prior to your pool session A caregiver may attend if needed with the patient to help assist as needed. A caregiver can sit in the pool room on chair. Please arrive IN YOUR SUIT and 15 minutes prior to your appointment - this helps to avoid delays in starting your session. Please make sure to attend to any toileting needs prior to entering the pool [place] rooms for changing are provided.   There is direct access to the pool deck form the locker room.  You can lock your belongings in a locker with lock provided. Once on the pool deck your therapist will ask if you have signed the Patient  Consent and Assignment of Benefits form before beginning treatment Your therapist may take your blood pressure prior to, during and after your session if indicated We usually  try and create a home exercise program based on activities we do in the pool.  Please be thinking about who might be able to assist you in the pool should you need to participate in an aquatic home exercise program at the time of discharge if you need assistance.  Some patients do not want to or do not have the ability to participate in an aquatic home program - this is not a barrier in any way to you participating in aquatic therapy as part of your current therapy plan! After Discharge from PT, you can continue using home  program at  the [facility], there is a drop-in fee for $5 ($45 a month)or for 60 years  or older $4.00 ($40 a month for seniors ) or any local [employer] pool.  Memberships for purchase are available for gym/pool at [facility] LAST AQUATIC VISIT.  PLEASE MAKE SURE THAT YOU HAVE A [address]  APPOINTMENT SCHEDULED.   About the pool: Pool is located approximately 500 FT from the entrance of the building.  Please bring a support person if you need assistance traveling this      distance.   Your therapist will assist you in entering the water; there are two ways to           enter: stairs with railings, and a mechanical lift. Your therapist will determine the most appropriate way for you.  Water temperature is usually between 88-90 degrees  There may be up to 2 other swimmers in the pool at the same time  The pool deck is tile, please wear shoes with good traction if you prefer not to be barefoot.    Contact Info:  For appointment scheduling and cancellations:         Please call the [facility]       All sessions are 45 minutes                                                    Voncille Lo, PT, [facility] Certified Exercise Expert for the Aging Adult  02/06/23 10:35 AM Phone: [phone] Fax: [phone]

## 2023-02-09 ENCOUNTER — Other Ambulatory Visit: Payer: Self-pay | Admitting: Family

## 2023-02-10 ENCOUNTER — Telehealth: Payer: Self-pay | Admitting: Allergy & Immunology

## 2023-02-10 NOTE — Telephone Encounter (Signed)
Patient called and stated that she is having an allergy flare. Patient states that her eyes are swollen and under and over her eyes are hurting. Patient is requesting a prescription for allergy eye drops. [place] [facility] [address]. Patients call back number is [phone].

## 2023-02-10 NOTE — Telephone Encounter (Signed)
Are her eyes itchy? Any changes in vision or drainage ? I would prefer that she come in to be seen

## 2023-02-11 ENCOUNTER — Encounter: Payer: Self-pay | Admitting: Physical Medicine and Rehabilitation

## 2023-02-11 ENCOUNTER — Encounter: Payer: Self-pay | Admitting: Family

## 2023-02-11 ENCOUNTER — Ambulatory Visit (INDEPENDENT_AMBULATORY_CARE_PROVIDER_SITE_OTHER): Payer: 59 | Admitting: Family

## 2023-02-11 ENCOUNTER — Telehealth: Payer: Self-pay | Admitting: Family

## 2023-02-11 ENCOUNTER — Other Ambulatory Visit: Payer: Self-pay

## 2023-02-11 VITALS — BP 128/86 | HR 77 | Temp 98.7°F | Resp 18

## 2023-02-11 DIAGNOSIS — H02841 Edema of right upper eyelid: Secondary | ICD-10-CM

## 2023-02-11 DIAGNOSIS — H02842 Edema of right lower eyelid: Secondary | ICD-10-CM | POA: Diagnosis not present

## 2023-02-11 NOTE — Telephone Encounter (Signed)
I have openings today if she would like to be seen

## 2023-02-11 NOTE — Telephone Encounter (Signed)
Thank you :)

## 2023-02-11 NOTE — Progress Notes (Signed)
[place] [address] Dept: [phone]  FOLLOW UP NOTE  Patient ID: [name], female    DOB: 1976/03/28  Age: 47 y.o. MRN: [id number] Date of Office Visit: 02/11/2023  Assessment  Chief Complaint: Angioedema  HPI [name] is a 47 year old female who presents today for an acute visit of right eye upper and lower eyelid swelling.  She was last seen on January 20, 2023 by myself for follow-up of moderate persistent asthma with acute exacerbation, anaphylactic shock due to food, allergic rhinitis, hidradenitis suppurativa, current everyday smoker, and shortness of breath.  She denies any new diagnosis or surgery since her last office visit.  She reports that yesterday afternoon she was sitting on the back porch and she noticed that her right eye was hurting.  This was around noon.  Around 1 PM she reports that her right eye was half the size of what it is now.  She took Benadryl around 7:40 PM to 8 PM and then took 1 pill this morning at 9 AM.  She reports that the white part of her eye turned red and she can feel a little pressure where the red is located.  She also was having  pressure on the right side of her nose, but Benadryl has helped with this.  She reports a little bit of itching now, burning, and drainage that is white/clear in color.  She denies pain with eye movement, blurred or double vision, weakness of eye muscles, changes in vision, and fever.  She reports that this happens every year during the springtime and summer.  She then mentions the last time it occurred was approximately a year and a half ago.  She does have a photo on her phone, but is not as swollen as it is today in the office.  She reports that she does have an eye doctor, Dr. Katy Fitch, but has not been seen in approximately 2 or 3 years.  She reports that this has been ongoing thing in the spring/summer since she was frying chicken and got hot oil in both of her eyes.  She mentions that she  has scar tissue in her eyes from oil. She denies any history of autoimmune disease.   Drug Allergies:  Allergies  Allergen Reactions   Shellfish Allergy     Review of Systems: Review of Systems  Constitutional:  Negative for chills and fever.  HENT:         Reports clear rhinorrhea and denies nasal congestion and post nasal drip. She did report pressure on the right side of nose, but this got better with Benadryl.  Eyes:  Positive for pain, discharge and redness. Negative for blurred vision and double vision.       Reports a little pressure where red is in eye, pain above and below eye, burning, little itching, and white/clear drainage. Denies pain with movement of eye. Also denies blurred or double vision  Respiratory: Negative.    Cardiovascular:  Negative for chest pain and palpitations.  Gastrointestinal:        Denies heartburn or reflux  Skin:  Negative for itching and rash.  Neurological:  Negative for headaches.  Endo/Heme/Allergies:  Positive for environmental allergies.     Physical Exam: BP 128/86 (BP Location: Right Arm, Patient Position: Sitting, Cuff Size: Large)   Pulse 77   Temp 98.7 F (37.1 C) (Temporal)   Resp 18   LMP 01/13/2023   SpO2 95%    Physical  Exam HENT:     Head: Normocephalic and atraumatic.     Comments: Pharynx normal, ears normal, nose: Bilateral lower turbinates moderately edematous and slightly erythematous with no drainage noted.  Left eye normal, right eye with swelling in upper and lower eyelid.  No discharge noted.  No conjunctival injection noted noted on visible portion of eye    Right Ear: Tympanic membrane, ear canal and external ear normal.     Left Ear: Tympanic membrane, ear canal and external ear normal.     Mouth/Throat:     Mouth: Mucous membranes are moist.     Pharynx: Oropharynx is clear.  Cardiovascular:     Rate and Rhythm: Regular rhythm.     Heart sounds: Normal heart sounds.  Pulmonary:     Effort: Pulmonary  effort is normal.     Breath sounds: Normal breath sounds.     Comments: Lungs clear to auscultation Skin:    General: Skin is warm.  Neurological:     Mental Status: She is alert and oriented to person, place, and time.  Psychiatric:        Mood and Affect: Mood normal.        Behavior: Behavior normal.        Thought Content: Thought content normal.        Judgment: Judgment normal.     Diagnostics:    Assessment and Plan: 1. Swelling of right upper eyelid   2. Swelling of right lower eyelid     No orders of the defined types were placed in this encounter.   Patient Instructions  Swelling of right upper and lower eyelid/ pressure/pain Appointment scheduled with Dr. Zenia Resides office [facility]) today at 2:45 PM. Please make sure to go to this appointment.  Call us after the appointment and let us know how you are doing Discussed if changes in vision occur, pain with eye movement, or she develops a fever to go to the ER.  Keep your already scheduled follow-up appointment with Dr. Ernst Bowler on March 20, 2023 at 5:15 PM or sooner if needed  Return in about 5 weeks (around 03/20/2023), or if symptoms worsen or fail to improve.    Thank you for the opportunity to care for this patient.  Please do not hesitate to contact me with questions.  Althea Charon, FNP [facility] and [place] [facility] [place]

## 2023-02-11 NOTE — Telephone Encounter (Signed)
[name] called to give [place] an update after seeing her eye doctor this afternoon. She reports that her right eye is swollen due to her being allergic to something. It was recommended that she see her allergist. She was given Remus Loffler  eye drops and a prescription eye drop that she does not know the name of. She reports that the eye doctor is going to send their note to our office.  Althea Charon, FNP

## 2023-02-11 NOTE — Telephone Encounter (Signed)
Patient made an appointment for 02/11/23 at 11:00am with Quita Skye.

## 2023-02-11 NOTE — Patient Instructions (Addendum)
Swelling of right upper and lower eyelid/ pressure/pain Appointment scheduled with Dr. Zenia Resides office [facility]) today at 2:45 PM. Please make sure to go to this appointment.  Call us after the appointment and let us know how you are doing Discussed if changes in vision occur, pain with eye movement, or she develops a fever to go to the ER.  Keep your already scheduled follow-up appointment with Dr. Ernst Bowler on March 20, 2023 at 5:15 PM or sooner if needed

## 2023-02-11 NOTE — Telephone Encounter (Signed)
Sounds good! Do you have spots today, Chrissie?   Salvatore Marvel, MD [facility] [place] [facility] [place]

## 2023-02-12 ENCOUNTER — Other Ambulatory Visit: Payer: Self-pay | Admitting: Family Medicine

## 2023-02-12 DIAGNOSIS — G8929 Other chronic pain: Secondary | ICD-10-CM

## 2023-02-12 NOTE — Telephone Encounter (Signed)
Cromolyn 4% eye drops - They help and she is doing much better swelling has gone down. She uses the eye drops up to 4 times a day.

## 2023-02-12 NOTE — Telephone Encounter (Signed)
See other message

## 2023-02-12 NOTE — Telephone Encounter (Signed)
What happened with her eye doctor?   Salvatore Marvel, MD [facility] [place] [facility] [place]

## 2023-02-12 NOTE — Telephone Encounter (Signed)
Great - I would be ok with a prednisone burst if she is interested.

## 2023-02-12 NOTE — Telephone Encounter (Signed)
I have not seen the eye doctors note yet, so I am not sure what the other prescription eye drop is that they put her on along with the [place]. Can you please call and see how [name] is doing and find out what the name of the other eye drop is that the eye doctor gave her

## 2023-02-12 NOTE — Telephone Encounter (Signed)
Awesome sauce. Thanks, Team!

## 2023-02-12 NOTE — Telephone Encounter (Signed)
Rx refill request approved per Dr. Corey's orders. 

## 2023-02-13 ENCOUNTER — Other Ambulatory Visit: Payer: Self-pay | Admitting: *Deleted

## 2023-02-13 ENCOUNTER — Telehealth: Payer: Self-pay | Admitting: Family

## 2023-02-13 MED ORDER — ALBUTEROL SULFATE (2.5 MG/3ML) 0.083% IN NEBU: 2.5000 mg | INHALATION_SOLUTION | RESPIRATORY_TRACT | 1 refills | Status: DC | PRN

## 2023-02-13 NOTE — Telephone Encounter (Signed)
Pharmacy is requesting a refill for albuterol (PROVENTIL) (2.5 MG/3ML) 0.083% nebulizer solution

## 2023-02-13 NOTE — Telephone Encounter (Signed)
Refills have been sent in to requesting pharmacy.

## 2023-02-13 NOTE — Telephone Encounter (Signed)
Great!

## 2023-02-17 ENCOUNTER — Encounter: Payer: Self-pay | Admitting: Dermatology

## 2023-02-17 ENCOUNTER — Ambulatory Visit (INDEPENDENT_AMBULATORY_CARE_PROVIDER_SITE_OTHER): Payer: 59 | Admitting: Dermatology

## 2023-02-17 VITALS — BP 124/80 | HR 77

## 2023-02-17 DIAGNOSIS — L732 Hidradenitis suppurativa: Secondary | ICD-10-CM | POA: Diagnosis not present

## 2023-02-17 DIAGNOSIS — Z79899 Other long term (current) drug therapy: Secondary | ICD-10-CM | POA: Diagnosis not present

## 2023-02-17 MED ORDER — SECUKINUMAB (300 MG DOSE) 150 MG/ML ~~LOC~~ SOAJ
300.0000 mg | Freq: Once | SUBCUTANEOUS | Status: AC
  Administered 2023-02-17: 300 mg via SUBCUTANEOUS

## 2023-02-17 NOTE — Progress Notes (Signed)
Follow-Up Visit   Subjective  [name] is a 47 y.o. female who presents for the following: hidradenitis, breast, inframammary, Minocycycline 100mg  1 po bid, Clindamycin lotion qd, here to start Cosentyx injections.  Few areas still draining.   The following portions of the chart were reviewed this encounter and updated as appropriate: medications, allergies, medical history  Review of Systems:  No other skin or systemic complaints except as noted in HPI or Assessment and Plan.  Objective  Well appearing patient in no apparent distress; mood and affect are within normal limits.   A focused examination was performed of the following areas: Breast, inframammary    Assessment & Plan   Hidradenitis Suppurativa Breast, inframammary Exam: ulcerated pap on the right inf breast with drainage, numerous hyperpigmented scarred macules and nodules and plaques of the upper abdomen and breast.  Severe with scarring. Chronic and persistent condition with duration or expected duration over one year. Condition is symptomatic / bothersome to patient. Not to goal.  Some improvement with PO minocycline/topical clindamycin, but still has active lesions. Here to start Cosentyx injections today.   Hidradenitis Suppurativa is a chronic; persistent; non-curable, but treatable condition due to abnormal inflamed sweat glands in the body folds (axilla, inframammary, groin, medial thighs), causing recurrent painful draining cysts and scarring. It can be associated with severe scarring acne and cysts; also abscesses and scarring of scalp. The goal is control and prevention of flares, as it is not curable. Scars are permanent and can be thickened. Treatment may include daily use of topical medication and oral antibiotics.  Oral isotretinoin may also be helpful.  For some cases, Humira or Cosentyx (biologic injections) may be prescribed to decrease the inflammatory process and prevent flares.  When indicated,  inflamed cysts may also be treated surgically.   Treatment Plan: Start Cosentyx 300mg /40ml sq injections today, then q wk x 4wks, then q month Cosentyx 300mg /57ml sq injection today to  R upper arm, Lot [id number], exp 06/2024 Cont Minocycyline 100mg  1 po bid, if areas stop draining decrease to 1 po qd Cont Clindamycin lotion qd aa  After three months on Cosentyx, may increase to q2wks if needed  Reviewed risks of biologics including immunosuppression, infections, injection site reaction, and failure to improve condition. Goal is control of skin condition, not cure.  Some older biologics such as Humira and Enbrel may slightly increase risk of malignancy and may worsen congestive heart failure.  Taltz and Cosentyx may cause inflammatory bowel disease to flare. The use of biologics requires long term medication management, including periodic office visits and monitoring of blood work.    Hidradenitis suppurativa  Related Medications clindamycin (CLEOCIN T) 1 % external solution Apply to open sores QD after shower.  Secukinumab (COSENTYX UNOREADY) 300 MG/2ML SOAJ Inject 1 Pen into the skin every 28 (twenty-eight) days.  Secukinumab (COSENTYX UNOREADY) 300 MG/2ML SOAJ Inject 1 Pen into the skin every 7 (seven) days. Inject 1 pen SQ QW x 5 weeks.  minocycline (MINOCIN) 100 MG capsule Take 1-2 capsules by mouth daily.  fluconazole (DIFLUCAN) 150 MG tablet Take 1 tablet (150 mg total) by mouth daily.  Secukinumab (300 MG Dose) SOAJ 300 mg     Return in about 3 months (around 05/20/2023) for HS.  I, Othelia Pulling, RMA, am acting as scribe for Brendolyn Patty, MD .   Documentation: I have reviewed the above documentation for accuracy and completeness, and I agree with the above.  Brendolyn Patty, MD

## 2023-02-17 NOTE — Patient Instructions (Signed)
Due to recent changes in healthcare laws, you may see results of your pathology and/or laboratory studies on MyChart before the doctors have had a chance to review them. We understand that in some cases there may be results that are confusing or concerning to you. Please understand that not all results are received at the same time and often the doctors may need to interpret multiple results in order to provide you with the best plan of care or course of treatment. Therefore, we ask that you please give us 2 business days to thoroughly review all your results before contacting the office for clarification. Should we see a critical lab result, you will be contacted sooner.   If You Need Anything After Your Visit  If you have any questions or concerns for your doctor, please call our main line at [phone] and press option 4 to reach your doctor's medical assistant. If no one answers, please leave a voicemail as directed and we will return your call as soon as possible. Messages left after 4 pm will be answered the following business day.   You may also send us a message via MyChart. We typically respond to MyChart messages within 1-2 business days.  For prescription refills, please ask your pharmacy to contact our office. Our fax number is [phone].  If you have an urgent issue when the clinic is closed that cannot wait until the next business day, you can page your doctor at the number below.    Please note that while we do our best to be available for urgent issues outside of office hours, we are not available 24/7.   If you have an urgent issue and are unable to reach us, you may choose to seek medical care at your doctor's office, retail clinic, urgent care center, or emergency room.  If you have a medical emergency, please immediately call 911 or go to the emergency department.  Pager Numbers  - Dr. Kowalski: [phone]  - Dr. Moye: [phone]  - Dr. Stewart:  [phone]  In the event of inclement weather, please call our main line at [phone] for an update on the status of any delays or closures.  Dermatology Medication Tips: Please keep the boxes that topical medications come in in order to help keep track of the instructions about where and how to use these. Pharmacies typically print the medication instructions only on the boxes and not directly on the medication tubes.   If your medication is too expensive, please contact our office at [phone] option 4 or send us a message through MyChart.   We are unable to tell what your co-pay for medications will be in advance as this is different depending on your insurance coverage. However, we may be able to find a substitute medication at lower cost or fill out paperwork to get insurance to cover a needed medication.   If a prior authorization is required to get your medication covered by your insurance company, please allow us 1-2 business days to complete this process.  Drug prices often vary depending on where the prescription is filled and some pharmacies may offer cheaper prices.  The website www.goodrx.com contains coupons for medications through different pharmacies. The prices here do not account for what the cost may be with help from insurance (it may be cheaper with your insurance), but the website can give you the price if you did not use any insurance.  - You can print the associated coupon and take it with   your prescription to the pharmacy.  - You may also stop by our office during regular business hours and pick up a GoodRx coupon card.  - If you need your prescription sent electronically to a different pharmacy, notify our office through [facility] MyChart or by phone at [phone] option 4.     Si Usted Necesita Algo Despus de Su Visita  Tambin puede enviarnos un mensaje a travs de MyChart. Por lo general respondemos a los mensajes de MyChart en el transcurso de 1 a 2  das hbiles.  Para renovar recetas, por favor pida a su farmacia que se ponga en contacto con nuestra oficina. Nuestro nmero de fax es el [phone].  Si tiene un asunto urgente cuando la clnica est cerrada y que no puede esperar hasta el siguiente da hbil, puede llamar/localizar a su doctor(a) al nmero que aparece a continuacin.   Por favor, tenga en cuenta que aunque hacemos todo lo posible para estar disponibles para asuntos urgentes fuera del horario de oficina, no estamos disponibles las 24 horas del da, los 7 das de la semana.   Si tiene un problema urgente y no puede comunicarse con nosotros, puede optar por buscar atencin mdica  en el consultorio de su doctor(a), en una clnica privada, en un centro de atencin urgente o en una sala de emergencias.  Si tiene una emergencia mdica, por favor llame inmediatamente al 911 o vaya a la sala de emergencias.  Nmeros de bper  - Dr. Kowalski: [phone]  - Dra. Moye: [phone]  - Dra. Stewart: [phone]  En caso de inclemencias del tiempo, por favor llame a nuestra lnea principal al [phone] para una actualizacin sobre el estado de cualquier retraso o cierre.  Consejos para la medicacin en dermatologa: Por favor, guarde las cajas en las que vienen los medicamentos de uso tpico para ayudarle a seguir las instrucciones sobre dnde y cmo usarlos. Las farmacias generalmente imprimen las instrucciones del medicamento slo en las cajas y no directamente en los tubos del medicamento.   Si su medicamento es muy caro, por favor, pngase en contacto con nuestra oficina llamando al [phone] y presione la opcin 4 o envenos un mensaje a travs de MyChart.   No podemos decirle cul ser su copago por los medicamentos por adelantado ya que esto es diferente dependiendo de la cobertura de su seguro. Sin embargo, es posible que podamos encontrar un medicamento sustituto a menor costo o llenar un formulario para que el  seguro cubra el medicamento que se considera necesario.   Si se requiere una autorizacin previa para que su compaa de seguros cubra su medicamento, por favor permtanos de 1 a 2 das hbiles para completar este proceso.  Los precios de los medicamentos varan con frecuencia dependiendo del lugar de dnde se surte la receta y alguna farmacias pueden ofrecer precios ms baratos.  El sitio web www.goodrx.com tiene cupones para medicamentos de diferentes farmacias. Los precios aqu no tienen en cuenta lo que podra costar con la ayuda del seguro (puede ser ms barato con su seguro), pero el sitio web puede darle el precio si no utiliz ningn seguro.  - Puede imprimir el cupn correspondiente y llevarlo con su receta a la farmacia.  - Tambin puede pasar por nuestra oficina durante el horario de atencin regular y recoger una tarjeta de cupones de GoodRx.  - Si necesita que su receta se enve electrnicamente a una farmacia diferente, informe a nuestra oficina a travs de MyChart de [facility]   o por telfono llamando al [phone] y presione la opcin 4.  

## 2023-02-25 ENCOUNTER — Encounter: Payer: Self-pay | Admitting: Family Medicine

## 2023-02-25 ENCOUNTER — Ambulatory Visit: Payer: 59 | Attending: Family Medicine | Admitting: Physical Therapy

## 2023-02-25 ENCOUNTER — Encounter: Payer: Self-pay | Admitting: Physical Therapy

## 2023-02-25 ENCOUNTER — Ambulatory Visit (INDEPENDENT_AMBULATORY_CARE_PROVIDER_SITE_OTHER): Payer: 59 | Admitting: Family Medicine

## 2023-02-25 VITALS — BP 132/86 | HR 76 | Ht 68.0 in | Wt 314.0 lb

## 2023-02-25 DIAGNOSIS — M25551 Pain in right hip: Secondary | ICD-10-CM | POA: Diagnosis present

## 2023-02-25 DIAGNOSIS — M545 Low back pain, unspecified: Secondary | ICD-10-CM | POA: Insufficient documentation

## 2023-02-25 DIAGNOSIS — G8929 Other chronic pain: Secondary | ICD-10-CM | POA: Diagnosis present

## 2023-02-25 DIAGNOSIS — R262 Difficulty in walking, not elsewhere classified: Secondary | ICD-10-CM

## 2023-02-25 DIAGNOSIS — M6281 Muscle weakness (generalized): Secondary | ICD-10-CM

## 2023-02-25 DIAGNOSIS — R293 Abnormal posture: Secondary | ICD-10-CM | POA: Diagnosis present

## 2023-02-25 DIAGNOSIS — M1610 Unilateral primary osteoarthritis, unspecified hip: Secondary | ICD-10-CM | POA: Diagnosis not present

## 2023-02-25 NOTE — Patient Instructions (Addendum)
WALKING  Walking is a great form of exercise to increase your strength, endurance and overall fitness.  A walking program can help you start slowly and gradually build endurance as you go.  Everyone's ability is different, so each person's starting point will be different.  You do not have to follow them exactly.  The are just samples. You should simply find out what's right for you and stick to that program.   In the beginning, you'll start off walking 2-3 times a day for short distances.  As you get stronger, you'll be walking further at just 1-2 times per day. American Medical Association  suggests 150 to 300 min of moderate exercise per week.  Like 30-45 min of walking 5 days a week DOMS  Delayed onset muscle soreness will occur when you have not exercised in a long time.  It will condition and not hurt if you are consistent about doing exercise.  You will get stronger.  A. You Can Walk For A Certain Length Of Time Each Day    Walk 5 minutes 3 times per day.  Increase 2 minutes every 2 days (3 times per day).  Work up to 25-30 minutes (1-2 times per day).   Example:   Day 1-2 5 minutes 3 times per day   Day 7-8 12 minutes 2-3 times per day   Day 13-14 25 minutes 1-2 times per day  B. You Can Walk For a Certain Distance Each Day     Distance can be substituted for time.    Example:   3 trips to mailbox (at road)   3 trips to corner of block   3 trips around the block  C. Go to local high school and use the track.    Walk for distance ____ around track  Or time __x__ minutes  D. Walk ___x_ Jog ____ Run ___  Please only do the exercises that your therapist has initialed and dated  Access Code: [email or web address] URL: [email or web address] Date: 02/25/2023 Prepared by: Voncille Lo  Program Notes Toe yoga  Exercises - Supine Posterior Pelvic Tilt  - 1 x daily - 7 x weekly - 3 sets - 10 reps - Supine Single Knee to Chest Stretch  - 2 x daily - 7 x weekly - 1 sets -  5 reps - 10 hold - Supine Lower Trunk Rotation  - 2 x daily - 7 x weekly - 1 sets - 5 reps - 20 hold - Supine Bridge  - 1 x daily - 7 x weekly - 3 sets - 10 reps - Sit to stand with sink support Movement snack  - 1 x daily - 7 x weekly - 3 sets - 10 reps - Seated Hamstring Stretch  - 1 x daily - 7 x weekly - 1 sets - 2 reps - 30 sec hold - Standing Heel Raise with Support  - 1 x daily - 7 x weekly - 31 sets - 25 reps - Standing Gastroc Stretch  - 1 x daily - 7 x weekly - 1 sets - 4 reps - 30 sec hold Voncille Lo, PT, [place] Certified Exercise Expert for the Aging Adult  02/25/23 11:21 AM Phone: [phone] Fax: [phone]

## 2023-02-25 NOTE — Therapy (Signed)
OUTPATIENT PHYSICAL THERAPY TREATMENT NOTE   Patient Name: RAMONIA ZULEGER MRN: [id number] DOB:08/31/1976, 47 y.o., female Today's Date: 02/25/2023  PCP: System, Provider Not In  REFERRING PROVIDER: Gregor Hams, MD   END OF SESSION:   PT End of Session - 02/25/23 1100     Visit Number 2    Number of Visits 16    Date for PT Re-Evaluation 04/03/23    Authorization Type [employer] MCR FOTO 6th and 10th visit    Progress Note Due on Visit 10    PT Start Time 1100    PT Stop Time 1145    PT Time Calculation (min) 45 min    Activity Tolerance Patient tolerated treatment well;Patient limited by pain    Behavior During Therapy WFL for tasks assessed/performed             Past Medical History:  Diagnosis Date   Asthma    Hydradenitis    MRSA (methicillin resistant staph aureus) culture positive    Obesity    Past Surgical History:  Procedure Laterality Date   CESAREAN SECTION     x2   TUBAL LIGATION     WISDOM TOOTH EXTRACTION  2011   Patient Active Problem List   Diagnosis Date Noted   Irregular periods 01/29/2023   Encounter for fertility planning 01/29/2023   Primary osteoarthritis of right hip 01/21/2023   Graves disease 07/06/2019   Hyperthyroidism 07/06/2019   Current smoker 05/26/2019   Seasonal and perennial allergic rhinitis 03/25/2018   Anaphylactic shock due to adverse food reaction 08/19/2017   Perennial and seasonal allergic rhinitis 08/19/2017   Moderate persistent asthma without complication [id number]    REFERRING DIAG: M54.41,G89.29 (ICD-10-CM) - Chronic right-sided low back pain with right-sided sciatica   THERAPY DIAG:  Chronic right-sided low back pain, unspecified whether sciatica present  Muscle weakness (generalized)  Pain in right hip  Difficulty in walking, not elsewhere classified  Abnormal posture  Rationale for Evaluation and Treatment Rehabilitation  PERTINENT HISTORY: Asthma, MRSA +, obesity, Grave's dz, hydrdenitis    PRECAUTIONS: shellfish allergy  SUBJECTIVE:                                                                                                                                                                                      SUBJECTIVE STATEMENT:  After my TPDN, I noticed hands  and feet and back of legs cramping.    But I am moving more now and the back of the legs now.  I was able to walk up my steps now without leaning on the rail after the last visit. Initial pain 9/10 and reduced after 6  MWT 8/10.  Pt at end of RX  6/10    EVAL -My doctor sent me here for my back, I have problems with my RT hip but I can't have surgery until I lose 40 pounds.  I have been having back problems for a long time 3 years but has worsened in the last 3 months.  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 I used to work [employer] in [employer].  I am here today with [name] whom pt is gaurdian over with 2 small children. I cannot leave [name] alone, she must be with me at all times.   PAIN:  Are you having pain? Yes: NPRS scale: 7/10 at rest and 11/ 10 at worst/10 Pain location: lower back into RT  buttock and stays Pain description: sharp pain in lower middle in the center and it radiating into my RT hip Aggravating factors: If I stand more than 10 minutes, going up steps my back is burning. Relieving factors: nothing Holding babies hard, no more than 5 minutes standing, I cannot sleep on back at night   OBJECTIVE: (objective measures completed at initial evaluation unless otherwise dated)   DIAGNOSTIC FINDINGS:  FINDINGS: Please note there are 4 non-rib-bearing lumbar vertebra. The lower most lumbar vertebra will be labeled L5. Insert straightening of normal lordosis. There is trace anterolisthesis of L4 on L5 that is likely facet mediated. Moderate disc space narrowing and spurring at L5-S1, mild disc space narrowing and spurring at L4-L5. Moderate L4-L5 and L5-S1 facet hypertrophy.  Normal vertebral body heights. No evidence of fracture or focal bone abnormalities. Degenerative change of both sacroiliac joints. Soft tissue attenuation from habitus limits detailed assessment.   IMPRESSION: 1. 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 2. Trace anterolisthesis of L4 on L5, likely facet mediated. 3. Please note there are 4 non-rib-bearing lumbar vertebra, the lower most lumbar vertebra was labeled L5.     Electronically Signed   By: Keith Rake M.D.   On: 01/22/2023 12:45   FINDINGS: There is acetabular over coverage. Osseous bump at the femoral head neck junction. Moderate right hip joint space narrowing and spurring. Acetabular sclerosis may be degenerative. No evidence of fracture, erosion or avascular necrosis. Left hip joint space narrowing and spurring to a lesser extent. Mild degenerative change of the sacroiliac joints and pubic symphysis.   IMPRESSION: 1. 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 2. Right hip acetabular over coverage may contribute to impingement.     Electronically Signed   By: Keith Rake M.D.   On: 01/22/2023 12:43   PATIENT SURVEYS:  FOTO 34% predicted  56%     SCREENING FOR RED FLAGS: Bowel or bladder incontinence: No Cauda equina syndrome: No     COGNITION: Overall cognitive status: Within functional limits for tasks assessed                          SENSATION: WFL   MUSCLE LENGTH: Hamstrings: Right 60 deg; Left 60 deg Thomas test: Right -30 deg; Left 22 deg   POSTURE: rounded shoulders, forward head, and anterior pelvic tilt  pelvic level higher my 2 finger widths on RT Obesity, and excess adipose tissue /pannus PALPATION: TTP over RT QL    LUMBAR ROM:    AROM eval  Flexion Fingertips to midshin no pain  Extension 30 with pinching pain on the  RT  Right lateral flexion Pain with concordant RT back pain   Left lateral flexion Finger to knee jt line  Right rotation 50% pain  Left rotation 50%   (Blank rows = not tested)   LOWER EXTREMITY ROM:      Active  Right eval Left eval  Hip flexion 60 60  Hip extension      Hip abduction      Hip adduction      Hip internal rotation 18 p! 21  Hip external rotation 19p! 25  Knee flexion      Knee extension      Ankle dorsiflexion      Ankle plantarflexion      Ankle inversion      Ankle eversion       (Blank rows = not tested)   LOWER EXTREMITY MMT:   Pain with Rt hip motion   MMT Right eval Left eval  Hip flexion 3- 3-  Hip extension 3- 4-  Hip abduction 3- 3-  Hip adduction      Hip internal rotation      Hip external rotation      Knee flexion 4 4  Knee extension 4 4  Ankle dorsiflexion      Ankle plantarflexion      Ankle inversion      Ankle eversion       (Blank rows = not tested)   LUMBAR SPECIAL TESTS:  Straight leg raise test: Negative and Slump test: Negative   FUNCTIONAL TESTS:  5 times sit to stand: 30.4 sec 6 minute walk test: TBD   GAIT: Distance walked: 150 ft Assistive device utilized: None Level of assistance: Complete Independence Comments: antalgic gait on RT   TODAY'S TREATMENT:  [facility] Adult PT Treatment:                                                DATE: 02-25-23 6MWT  1015 ft (1635-1980 ft) Therapeutic Exercise: STS 2 x 10  then last set AMRAP then Hamstring stretch bil 30 sec between sets with VC and TC STS AMRAP  23 Hamstring stretch 30 sec RT and LT Heel raise  Bil 25 Gastroc stretch standing with hands on wall 30 sec on RT and LT PPT x 15 Supine SKTC  5 reps - 10 hold Supine Lower Trunk Rotation 5 reps 20 sec hold Bridge 2 x 10 Supine hip abduction 2 x 10 with RTB  Self Care: Walking program explanation and explanation of DOMS and aftercare    02-06-23  eval, issue HEP and TPDN                                                                                                                              DATE: Trigger Point Dry Needling Treatment: Pre-treatment instruction: Patient  instructed on dry needling rationale, procedures, and possible side effects including pain during treatment (achy,cramping feeling), bruising, drop of blood, lightheadedness, nausea, sweating. Patient Consent Given: Yes Education handout provided: Yes Muscles treated: RT QL  Needle size and number:  105 x .62mm Electrical stimulation performed: No Parameters: N/A Treatment response/outcome: Twitch response elicited and Palpable decrease in muscle tension Post-treatment instructions: Patient instructed to expect possible mild to moderate muscle soreness later today and/or tomorrow. Patient instructed in methods to reduce muscle soreness and to continue prescribed HEP. If patient was dry needled over the lung field, patient was instructed on signs and symptoms of pneumothorax and, however unlikely, to see immediate medical attention should they occur. Patient was also educated on signs and symptoms of infection and to seek medical attention should they occur. Patient verbalized understanding of these instructions and education.     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 with two children present she must oversee legally Education method: Explanation, Demonstration, Tactile cues, Verbal cues, and Handouts Education comprehension: verbalized understanding, returned demonstration, verbal cues required, tactile cues required, and needs further education   HOME EXERCISE PROGRAM:   Access Code: [email or web address] URL: [email or web address] Date: 02/25/2023 Prepared by: Voncille Lo  Program Notes Toe  yoga  Exercises - Supine Posterior Pelvic Tilt  - 1 x daily - 7 x weekly - 3 sets - 10 reps - Supine Single Knee to Chest Stretch  - 2 x daily - 7 x weekly - 1 sets - 5 reps - 10 hold - Supine Lower Trunk Rotation  - 2 x daily - 7 x weekly - 1 sets - 5 reps - 20 hold - Supine Bridge  - 1 x daily - 7 x weekly - 3 sets - 10 reps - Sit to stand with sink support Movement snack  - 1 x daily - 7 x weekly - 3 sets - 10 reps - Seated Hamstring Stretch  - 1 x daily - 7 x weekly - 1 sets - 2 reps - 30 sec hold - Standing Heel Raise with Support  - 1 x daily - 7 x weekly - 31 sets - 25 reps - Standing Gastroc Stretch  - 1 x daily - 7 x weekly - 1 sets - 4 reps - 30 sec hold ASSESSMENT:   CLINICAL IMPRESSION: Pt returns to clinic with 9/10 pain and reduced to 6/10 at end of session.  Pt complains about cramping in hands and feet and gastrocs.  Pt was able to perform 6MWT  1015 ft (1635-1980 ft) but needed to rest and do PPT for decrease in pain.  Pt is improved with exercise for pain.  Pt HEP updated today. Education on walking program and DOMS.  EVAL-Patient is a 47 y.o. female who was seen today for physical therapy evaluation and treatment for chronic RT side back pain  with radiculopathy into buttocks. Pt also has RT hip pain that radiates into groin and is hoping for eventual THA once she loses 40 pounds. Pt would benefit from aquatics and skilled PT to address impairments that are affecting daily life, ability to negotiate step and care for young children in her home.    OBJECTIVE IMPAIRMENTS: difficulty walking, decreased ROM, decreased strength, hypomobility, impaired flexibility, improper body mechanics, postural dysfunction, obesity, and pain.    ACTIVITY LIMITATIONS: carrying, lifting, bending, standing, squatting, sleeping, stairs, transfers, locomotion level, and caring for others   PARTICIPATION LIMITATIONS: meal prep, cleaning, laundry, shopping, community activity, and caring for others    PERSONAL FACTORS: Asthma, MRSA +, obesity, Grave's dz, hydrdenitis are also affecting patient's functional outcome.    REHAB POTENTIAL: Good   CLINICAL DECISION MAKING: Evolving/moderate complexity   EVALUATION COMPLEXITY: Moderate     GOALS: Goals reviewed with patient? Yes   SHORT TERM GOALS: Target date: 03-06-23   Pt will be independent with initial HEP Baseline:no knowledge Goal status: ONGOING   2.  Pt with 7/10 pain at rest. Baseline: 7/10  Goal status:ONGOING   3.  Demonstrate understanding of neutral posture and be more conscious of position and posture throughout the day.  Baseline: no knowledge Goal status:ONGOING       LONG TERM GOALS: Target date: 04-03-23   Pt will be able to stand for 30 minutes to care for infant grandchildren  Baseline: unable to stand for 5 min Goal status: INITIAL   2.  Pt will be able to perform household chores with pain level 3/10 or less Baseline: unable to stand for 5 min, unable to do chores Goal status: INITIAL   3.  Pt will be able to sleep for 5 or more hours consecutive sleep Baseline: waking every 2 hours Goal status: INITIAL   4.  Pt will be able to get on ground to play and care for grandchildren Baseline: unable to transfer and rise from ground Goal status: INITIAL   5.  FOTO will improve from  34%  to   56%  indicating improved functional mobility.  Baseline: eval 34 % Goal status: INITIAL   6.  Demonstrate 4/5 RT LE strength to improve stability, safety and endurance of community level function Baseline: Pt with demonstrated weakness with 30 sec 5 x STS Goal status: INITIAL   7. Pt will be able to negotiate steps with pain 3/10 or less            Baseline: Pt unable to negotiate steps without 10/10 pain            Goal Status, INITIAL   PLAN:   PT FREQUENCY: 1-2x/week   PT DURATION: 8 weeks   PLANNED INTERVENTIONS: Therapeutic exercises, Therapeutic activity, Neuromuscular re-education, Balance  training, Gait training, Patient/Family education, Self Care, Joint mobilization, Stair training, Aquatic Therapy, Dry Needling, Electrical stimulation, Spinal mobilization, Cryotherapy, Moist heat, Taping, Ionotophoresis 4mg /ml Dexamethasone, Manual therapy, and Re-evaluation.   PLAN FOR NEXT SESSION: Assess TPDN and review and progress exercise   Voncille Lo, PT, [facility] Certified Exercise Expert for the Aging Adult  02/25/23 11:46 AM Phone: [phone] Fax: [phone]

## 2023-02-25 NOTE — Patient Instructions (Addendum)
Thank you for coming in today.   Follow up with pain management as scheduled.   You should hear from [facility] about Bariatric Surgery. I think Gastric Sleeve could help.   Work on quitting smoking.  Gum could help.   I can redo the hip injection early June.   Endoscopic Sleeve Gastroplasty  Endoscopic sleeve gastroplasty is a weight loss procedure. It is also called a bariatric procedure. It is done by inserting a long, flexible tube (endoscope) through the mouth and into the stomach. Through the endoscope, the surgeon applies surgical staples or stitches (sutures) to make the stomach smaller. This procedure does not require any incisions. The reduced size of the stomach limits the amount of food that you can eat, which helps you lose weight. You may need this procedure if you are very overweight and have not been able to lose enough weight with diet and exercise. Your health care provider may recommend this procedure if other types of bariatric surgery are not safe for you. Tell a health care provider about: Any allergies you have. All medicines you are taking, including vitamins, herbs, eye drops, creams, and over-the-counter medicines. Any problems you or family members have had with anesthetic medicines. Any medical conditions or bleeding problems you may have. Any surgeries you have had. Any family history of stomach cancer, hiatal hernia, or other stomach problems you have. Any mental health problems you have. Whether you are pregnant or may be pregnant. What are the risks? Generally, this is a safe procedure. However, problems may occur, including: Abdomen pain and nausea. Infection. Bleeding. Allergic reactions to medicines. Damage to the stomach or the esophagus. The esophagus is the part of the body that moves food from the mouth to the stomach. Stomach juices leaking outside of the stomach. Blood clots. What happens before the procedure? Staying  hydrated Follow instructions from your health care provider about hydration, which may include: Up to 2 hours before the procedure - you may continue to drink clear liquids, such as water, clear fruit juice, black coffee, and plain tea. Eating and drinking restrictions Follow instructions from your health care provider about eating and drinking, which may include: 8 hours before the procedure - stop eating heavy meals or foods such as meat, fried foods, or fatty foods. 6 hours before the procedure - stop eating light meals or foods, such as toast or cereal. 6 hours before the procedure - stop drinking milk or drinks that contain milk. 2 hours before the procedure - stop drinking clear liquids. Medicines Ask your health care provider about: Changing or stopping your regular medicines. This is especially important if you are taking diabetes medicines or blood thinners. Taking medicines such as aspirin and ibuprofen. These medicines can thin your blood. Do not take these medicines unless your health care provider tells you to take them. Taking over-the-counter medicines, vitamins, herbs, and supplements. Tests You may have an exam or testing. This may include: Blood tests. Urine or stool tests. X-rays. Ultrasound. Esophageal manometry. This test checks the tube that carries food from your mouth to your stomach (esophagus). General instructions You may need to meet with a diet and nutrition specialist (dietitian) and a mental health professional to make sure you can commit to maintaining a healthy diet and a healthy weight for years after this procedure. Plan to have a responsible adult take you home from the hospital or clinic. Plan to have a responsible adult care for you for the time you are  told after you leave the hospital or clinic. This is important. Ask your health care provider what steps will be taken to help prevent infection. These may include: Removing hair at the surgery  site. Washing skin with a germ-killing soap. Taking antibiotic medicine. What happens during the procedure? An IV will be inserted into one of your veins. You will be given one or both of the following: A medicine to help you relax (sedative). A medicine to make you fall asleep (general anesthetic). You will be positioned so that you are lying on your left side. A long, flexible tube (endoscope) will be passed through your mouth, down your esophagus, and into your stomach after you are asleep. A type of gas will be sent through the endoscope to expand your stomach. This will give your surgeon a clearer view of your stomach and will make more room to do the procedure. The surgeon will use a camera to see inside your stomach. The surgeon will use a tool on the endoscope to staple or stitch your stomach to make it narrower and shorter. No incisions will be made. The narrower, shorter stomach restricts the amount of food you can eat, causing you to feel full sooner. Your stomach will be washed out with an antibiotic solution. The endoscope will be removed. The procedure may vary among health care providers and hospitals. What happens after the procedure? Your blood pressure, heart rate, breathing rate, and blood oxygen level will be monitored until you leave the hospital or clinic. You may continue to receive fluids and medicines through an IV. When you are able to drink clear fluids, your IV will be removed. If you were given a sedative during the procedure, it can affect you for several hours. Do not drive or operate machinery until your health care provider says that it is safe. You may have to wear compression stockings. These stockings help to prevent blood clots and reduce swelling in your legs. Summary Endoscopic sleeve gastroplasty is a procedure to help you lose weight (bariatric procedure). This procedure is done through a long, flexible tube (endoscope). No incisions are  necessary. During this procedure, the surgeon will use staples or stitches (sutures) to make your stomach shorter and narrower. This information is not intended to replace advice given to you by your health care provider. Make sure you discuss any questions you have with your health care provider. Document Revised: 03/22/2021 Document Reviewed: 03/22/2021 Elsevier Patient Education  [place].

## 2023-02-25 NOTE — Progress Notes (Signed)
Shirlyn Goltz, PhD, LAT, ATC acting as a scribe for Lynne Leader, MD.  [name] is a 47 y.o. female who presents to [place] at [facility] today for cont'd chronic R hip pain. Pt was last seen by Dr. Georgina Snell on 01/21/23 and was given a R femoral acetabular steroid injection and they discussed weight management, and was referred to PT and has her 2nd visit scheduled for later today. Pt called the office on 01/24/23 reporting the steroid injection had worn off and requested additional rx and was referred to pain management. Today, pt reports continued right hip pain and locking. Scheduled with pain management on 03/03/23. Denies new injury to the area. Does note a hot sensation in the right thigh over the past week, this is a new sx.   She is trying to lose weight but having difficulty.  She is always been heavy and had difficulty losing weight.  She currently smokes about 6 light cigarettes per day.  She is in the process of quitting.   Pertinent review of systems: No fevers or chills  Relevant historical information: Asthma   Exam:  BP 132/86   Pulse 76   Ht 5\' 8"  (1.727 m)   Wt (!) 314 lb (142.4 kg)   SpO2 96%   BMI 47.74 kg/m  General: Well Developed, well nourished, and in no acute distress.   MSK: Right hip decreased range of motion.    Lab and Radiology Results  EXAM: DG HIP (WITH OR WITHOUT PELVIS) 2-3V RIGHT   COMPARISON:  None Available.   FINDINGS: There is acetabular over coverage. Osseous bump at the femoral head neck junction. Moderate right hip joint space narrowing and spurring. Acetabular sclerosis may be degenerative. No evidence of fracture, erosion or avascular necrosis. Left hip joint space narrowing and spurring to a lesser extent. Mild degenerative change of the sacroiliac joints and pubic symphysis.   IMPRESSION: 1. 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 2. Right hip  acetabular over coverage may contribute to impingement.     Electronically Signed   By: Keith Rake M.D.   On: 01/22/2023 12:43   I, Lynne Leader, personally (independently) visualized and performed the interpretation of the images attached in this note.     Assessment and Plan: 47 y.o. female with right hip pain due to osteoarthritis.  Fundamentally she needs a hip replacement.  Steroid injection performed late February only lasted a few days.  She has an appointment scheduled with pain management next week.  Perhaps they can perform nerve ablations to help with chronic pain in her hip but ultimately she is going to need a hip replacement.  I have also referred her to physical therapy which starts tomorrow which could be helpful some with her hip but there is only so much better she is getting get until she has a hip replacement.  Her hip replacement is her current BMI which is 47.7.  She struggled with obesity her entire adult life.  I am not optimistic in her ability to lose weight rapidly enough to get BMI under 40 to have a hip replacement in the near future. We talked about options.  Plan to refer her to bariatric surgery to discuss the possibility of a gastric sleeve.  She is willing to consider it.  She does currently smoke about 6 cigarettes/day and is in the process of quitting.  I did some tobacco cessation counseling and recommended using  nicotine gum.  Recheck with me early June.  I could proceed with another injection then if needed in her hip.  I am not optimistic it will help much.  Total encounter time 30 minutes including face-to-face time with the patient and, reviewing past medical record, and charting on the date of service.     PDMP not reviewed this encounter. Orders Placed This Encounter  Procedures   Amb Referral to Bariatric Surgery    Referral Priority:   Routine    Referral Type:   Consultation    Number of Visits Requested:   1   No orders of the defined  types were placed in this encounter.    Discussed warning signs or symptoms. Please see discharge instructions. Patient expresses understanding.   The above documentation has been reviewed and is accurate and complete Lynne Leader, M.D.

## 2023-02-27 ENCOUNTER — Ambulatory Visit: Payer: 59 | Admitting: Physical Therapy

## 2023-02-27 ENCOUNTER — Encounter: Payer: Self-pay | Admitting: Physical Therapy

## 2023-02-27 DIAGNOSIS — G8929 Other chronic pain: Secondary | ICD-10-CM

## 2023-02-27 DIAGNOSIS — M25551 Pain in right hip: Secondary | ICD-10-CM

## 2023-02-27 DIAGNOSIS — M545 Low back pain, unspecified: Secondary | ICD-10-CM | POA: Diagnosis not present

## 2023-02-27 DIAGNOSIS — R262 Difficulty in walking, not elsewhere classified: Secondary | ICD-10-CM

## 2023-02-27 DIAGNOSIS — M6281 Muscle weakness (generalized): Secondary | ICD-10-CM

## 2023-02-27 DIAGNOSIS — R293 Abnormal posture: Secondary | ICD-10-CM

## 2023-02-27 NOTE — Therapy (Signed)
OUTPATIENT PHYSICAL THERAPY TREATMENT NOTE   Patient Name: [name] MRN: [id number] DOB:02-09-1976, 47 y.o., female Today's Date: 02/27/2023  PCP: System, Provider Not In  REFERRING PROVIDER: Gregor Hams, MD   END OF SESSION:   PT End of Session - 02/27/23 1359     Visit Number 3    Number of Visits 16    Date for PT Re-Evaluation 04/03/23    Authorization Type [employer] MCR FOTO 6th and 10th visit    Progress Note Due on Visit 10    PT Start Time 1400    PT Stop Time 1442    PT Time Calculation (min) 42 min    Activity Tolerance Patient tolerated treatment well;Patient limited by pain    Behavior During Therapy WFL for tasks assessed/performed              Past Medical History:  Diagnosis Date   Asthma    Hydradenitis    MRSA (methicillin resistant staph aureus) culture positive    Obesity    Past Surgical History:  Procedure Laterality Date   CESAREAN SECTION     x2   TUBAL LIGATION     WISDOM TOOTH EXTRACTION  2011   Patient Active Problem List   Diagnosis Date Noted   Hip arthritis 02/25/2023   Irregular periods 01/29/2023   Encounter for fertility planning 01/29/2023   Primary osteoarthritis of right hip 01/21/2023   Graves disease 07/06/2019   Hyperthyroidism 07/06/2019   Current smoker 05/26/2019   Seasonal and perennial allergic rhinitis 03/25/2018   Anaphylactic shock due to adverse food reaction 08/19/2017   Perennial and seasonal allergic rhinitis 08/19/2017   Moderate persistent asthma without complication [id number]    REFERRING DIAG: M54.41,G89.29 (ICD-10-CM) - Chronic right-sided low back pain with right-sided sciatica   THERAPY DIAG:  Chronic right-sided low back pain, unspecified whether sciatica present  Muscle weakness (generalized)  Pain in right hip  Difficulty in walking, not elsewhere classified  Abnormal posture  Rationale for Evaluation and Treatment Rehabilitation  PERTINENT HISTORY: Asthma, MRSA +, obesity,  Grave's dz, hydrdenitis   PRECAUTIONS: shellfish allergy  SUBJECTIVE:                                                                                                                                                                                      SUBJECTIVE STATEMENT:  Pt states that she is sore from the 6 MWT last visit, but knows this is not a bad thing.  She rates her pain currently at 6-7/10    EVAL -My doctor sent me here for my back, I have problems with my RT hip but I can't  have surgery until I lose 40 pounds.  I have been having back problems for a long time 3 years but has worsened in the last 3 months.  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 I used to work [employer] in [employer].  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 I cannot leave [name] alone, she must be with me at all times.   PAIN:  Are you having pain? Yes: NPRS scale: 7/10 at rest and 11/ 10 at worst/10 Pain location: lower back into RT  buttock and stays Pain description: sharp pain in lower middle in the center and it radiating into my RT hip Aggravating factors: If I stand more than 10 minutes, going up steps my back is burning. Relieving factors: nothing Holding babies hard, no more than 5 minutes standing, I cannot sleep on back at night   OBJECTIVE: (objective measures completed at initial evaluation unless otherwise dated)   DIAGNOSTIC FINDINGS:  FINDINGS: Please note there are 4 non-rib-bearing lumbar vertebra. The lower most lumbar vertebra will be labeled L5. Insert straightening of normal lordosis. There is trace anterolisthesis of L4 on L5 that is likely facet mediated. Moderate disc space narrowing and spurring at L5-S1, mild disc space narrowing and spurring at L4-L5. Moderate L4-L5 and L5-S1 facet hypertrophy. Normal vertebral body heights. No evidence of fracture or focal bone abnormalities. Degenerative change of both sacroiliac  joints. Soft tissue attenuation from habitus limits detailed assessment.   IMPRESSION: 1. 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 2. Trace anterolisthesis of L4 on L5, likely facet mediated. 3. Please note there are 4 non-rib-bearing lumbar vertebra, the lower most lumbar vertebra was labeled L5.     Electronically Signed   By: Keith Rake M.D.   On: 01/22/2023 12:45   FINDINGS: There is acetabular over coverage. Osseous bump at the femoral head neck junction. Moderate right hip joint space narrowing and spurring. Acetabular sclerosis may be degenerative. No evidence of fracture, erosion or avascular necrosis. Left hip joint space narrowing and spurring to a lesser extent. Mild degenerative change of the sacroiliac joints and pubic symphysis.   IMPRESSION: 1. 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 2. Right hip acetabular over coverage may contribute to impingement.     Electronically Signed   By: Keith Rake M.D.   On: 01/22/2023 12:43   PATIENT SURVEYS:  FOTO 34% predicted  56%     SCREENING FOR RED FLAGS: Bowel or bladder incontinence: No Cauda equina syndrome: No     COGNITION: Overall cognitive status: Within functional limits for tasks assessed                          SENSATION: WFL   MUSCLE LENGTH: Hamstrings: Right 60 deg; Left 60 deg Thomas test: Right -30 deg; Left 22 deg   POSTURE: rounded shoulders, forward head, and anterior pelvic tilt  pelvic level higher my 2 finger widths on RT Obesity, and excess adipose tissue /pannus PALPATION: TTP over RT QL    LUMBAR ROM:    AROM eval  Flexion Fingertips to midshin no pain  Extension 30 with pinching pain on the RT  Right lateral flexion Pain with concordant RT back pain  Left lateral flexion Finger to knee jt line  Right rotation 50% pain  Left rotation 50%   (Blank  rows = not tested)    LOWER EXTREMITY ROM:      Active  Right eval Left eval  Hip flexion 60 60  Hip extension      Hip abduction      Hip adduction      Hip internal rotation 18 p! 21  Hip external rotation 19p! 25  Knee flexion      Knee extension      Ankle dorsiflexion      Ankle plantarflexion      Ankle inversion      Ankle eversion       (Blank rows = not tested)   LOWER EXTREMITY MMT:   Pain with Rt hip motion   MMT Right eval Left eval  Hip flexion 3- 3-  Hip extension 3- 4-  Hip abduction 3- 3-  Hip adduction      Hip internal rotation      Hip external rotation      Knee flexion 4 4  Knee extension 4 4  Ankle dorsiflexion      Ankle plantarflexion      Ankle inversion      Ankle eversion       (Blank rows = not tested)   LUMBAR SPECIAL TESTS:  Straight leg raise test: Negative and Slump test: Negative   FUNCTIONAL TESTS:  5 times sit to stand: 30.4 sec 6 minute walk test: TBD   GAIT: Distance walked: 150 ft Assistive device utilized: None Level of assistance: Complete Independence Comments: antalgic gait on RT   TODAY'S TREATMENT:  TREATMENT 02/27/23:  Aquatic therapy at [address] - therapeutic pool temp 92 degrees Pt enters building independently.  Treatment took place in water 3.8 to  4 ft 8 in.feet deep depending upon activity.  Pt entered and exited the pool via stair and handrails    Aquatic Therapy:  Water walking for warm up fwd/lat/bkwds  Standing: Standing march Hamstring curl x20 BIL Hip abd/add x20 BIL Hip Circles CC/CCW 2x10 each BIL Heel raises - x20 Hip ext/flex with knee straight x 20 BIL Cross over walking Bil shoulder ext with rainbow dumbells with core contraction alternating Yellow noodle stomp x20 ea Squats x20 Lunge fwd x20 Lunge lateral x20 Step ups on submerged step 2x10 BIL Step up and overs on submerged step 2x10 BIL  Sitting on bench in water: Bicycle kicks x1' Reverse bicycle kicks x1' Flutter kicks  x1' Scissor kicks x1'   Pt requires the buoyancy of water for active assisted exercises with buoyancy supported for strengthening and AROM exercises. Hydrostatic pressure also supports joints by unweighting joint load by at least 50 % in 3-4 feet depth water. 80% in chest to neck deep water. Water will provide assistance with movement using the current and laminar flow while the buoyancy reduces weight bearing. Pt requires the viscosity of the water for resistance with strengthening exercises.   [facility] Adult PT Treatment:                                                DATE: 02-25-23 6MWT  1015 ft (1635-1980 ft) Therapeutic Exercise: STS 2 x 10  then last set AMRAP then Hamstring stretch bil 30 sec between sets with VC and TC STS AMRAP  23 Hamstring stretch 30 sec RT and LT Heel raise  Bil 25 Gastroc stretch standing  with hands on wall 30 sec on RT and LT PPT x 15 Supine SKTC  5 reps - 10 hold Supine Lower Trunk Rotation 5 reps 20 sec hold Bridge 2 x 10 Supine hip abduction 2 x 10 with RTB  Self Care: Walking program explanation and explanation of DOMS and aftercare    02-06-23  eval, issue HEP and TPDN                                                                                                                             DATE: Trigger Point Dry Needling Treatment: Pre-treatment instruction: Patient instructed on dry needling rationale, procedures, and possible side effects including pain during treatment (achy,cramping feeling), bruising, drop of blood, lightheadedness, nausea, sweating. Patient Consent Given: Yes Education handout provided: Yes Muscles treated: RT QL  Needle size and number:  105 x .93mm Electrical stimulation performed: No Parameters: N/A Treatment response/outcome: Twitch response elicited and Palpable decrease in muscle tension Post-treatment instructions: Patient instructed to expect possible mild to moderate muscle soreness later today and/or tomorrow. Patient  instructed in methods to reduce muscle soreness and to continue prescribed HEP. If patient was dry needled over the lung field, patient was instructed on signs and symptoms of pneumothorax and, however unlikely, to see immediate medical attention should they occur. Patient was also educated on signs and symptoms of infection and to seek medical attention should they occur. Patient verbalized understanding of these instructions and education.     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 with two children present she must oversee legally Education method: Explanation, Demonstration, Tactile cues, Verbal cues, and Handouts Education comprehension: verbalized understanding, returned demonstration, verbal cues required, tactile cues required, and needs further education   HOME EXERCISE PROGRAM:   Access Code: [email or web address] URL: [email or web address] Date: 02/25/2023 Prepared by: Voncille Lo  Program Notes Toe yoga  Exercises - Supine Posterior Pelvic Tilt  - 1 x daily - 7 x weekly - 3 sets - 10 reps - Supine Single Knee to Chest Stretch  - 2 x daily - 7 x weekly - 1 sets - 5 reps - 10 hold - Supine Lower Trunk Rotation  - 2 x daily - 7 x weekly - 1 sets - 5 reps - 20 hold - Supine Bridge  - 1 x daily -  7 x weekly - 3 sets - 10 reps - Sit to stand with sink support Movement snack  - 1 x daily - 7 x weekly - 3 sets - 10 reps - Seated Hamstring Stretch  - 1 x daily - 7 x weekly - 1 sets - 2 reps - 30 sec hold - Standing Heel Raise with Support  - 1 x daily - 7 x weekly - 31 sets - 25 reps - Standing Gastroc Stretch  - 1 x daily - 7 x weekly - 1 sets - 4 reps - 30 sec hold ASSESSMENT:   CLINICAL  IMPRESSION: Session today focused on global activity, hip strength, and core strength in the aquatic environment for use of buoyancy to offload joints and the viscosity of water as resistance during therapeutic exercise.  Exercises selected and modified to reduce R hip pain intensity.  Pt requires min verbal cuing for form.  Patient was able to tolerate all prescribed exercises in the aquatic environment with no adverse effects and reports 5/10 pain at the end of the session. Patient continues to benefit from skilled PT services on land and aquatic based and should be progressed as able to improve functional independence.   EVAL-Patient is a 47 y.o. female who was seen today for physical therapy evaluation and treatment for chronic RT side back pain with radiculopathy into buttocks. Pt also has RT hip pain that radiates into groin and is hoping for eventual THA once she loses 40 pounds. Pt would benefit from aquatics and skilled PT to address impairments that are affecting daily life, ability to negotiate step and care for young children in her home.    OBJECTIVE IMPAIRMENTS: difficulty walking, decreased ROM, decreased strength, hypomobility, impaired flexibility, improper body mechanics, postural dysfunction, obesity, and pain.    ACTIVITY LIMITATIONS: carrying, lifting, bending, standing, squatting, sleeping, stairs, transfers, locomotion level, and caring for others   PARTICIPATION LIMITATIONS: meal prep, cleaning, laundry, shopping, community activity, and caring for others   PERSONAL FACTORS: Asthma, MRSA +, obesity, Grave's dz, hydrdenitis are also affecting patient's functional outcome.    REHAB POTENTIAL: Good   CLINICAL DECISION MAKING: Evolving/moderate complexity   EVALUATION COMPLEXITY: Moderate     GOALS: Goals reviewed with patient? Yes   SHORT TERM GOALS: Target date: 03-06-23   Pt will be independent with initial HEP Baseline:no knowledge Goal status: ONGOING   2.  Pt  with 7/10 pain at rest. Baseline: 7/10  Goal status:ONGOING   3.  Demonstrate understanding of neutral posture and be more conscious of position and posture throughout the day.  Baseline: no knowledge Goal status:ONGOING       LONG TERM GOALS: Target date: 04-03-23   Pt will be able to stand for 30 minutes to care for infant grandchildren  Baseline: unable to stand for 5 min Goal status: INITIAL   2.  Pt will be able to perform household chores with pain level 3/10 or less Baseline: unable to stand for 5 min, unable to do chores Goal status: INITIAL   3.  Pt will be able to sleep for 5 or more hours consecutive sleep Baseline: waking every 2 hours Goal status: INITIAL   4.  Pt will be able to get on ground to play and care for grandchildren Baseline: unable to transfer and rise from ground Goal status: INITIAL   5.  FOTO will improve from  34%  to   56%  indicating improved functional mobility.  Baseline: eval 34 % Goal  status: INITIAL   6.  Demonstrate 4/5 RT LE strength to improve stability, safety and endurance of community level function Baseline: Pt with demonstrated weakness with 30 sec 5 x STS Goal status: INITIAL   7. Pt will be able to negotiate steps with pain 3/10 or less            Baseline: Pt unable to negotiate steps without 10/10 pain            Goal Status, INITIAL   PLAN:   PT FREQUENCY: 1-2x/week   PT DURATION: 8 weeks   PLANNED INTERVENTIONS: Therapeutic exercises, Therapeutic activity, Neuromuscular re-education, Balance training, Gait training, Patient/Family education, Self Care, Joint mobilization, Stair training, Aquatic Therapy, Dry Needling, Electrical stimulation, Spinal mobilization, Cryotherapy, Moist heat, Taping, Ionotophoresis 4mg /ml Dexamethasone, Manual therapy, and Re-evaluation.   PLAN FOR NEXT SESSION: Assess TPDN and review and progress exercise   Mathis Dad PT 02/27/23 2:44 PM Phone: [phone] Fax: [phone]

## 2023-03-03 ENCOUNTER — Encounter: Payer: 59 | Attending: Physical Medicine and Rehabilitation | Admitting: Physical Medicine and Rehabilitation

## 2023-03-03 VITALS — BP 141/87 | HR 71 | Ht 68.0 in | Wt 321.0 lb

## 2023-03-03 DIAGNOSIS — Z5181 Encounter for therapeutic drug level monitoring: Secondary | ICD-10-CM | POA: Diagnosis present

## 2023-03-03 DIAGNOSIS — G894 Chronic pain syndrome: Secondary | ICD-10-CM | POA: Insufficient documentation

## 2023-03-03 DIAGNOSIS — Z79891 Long term (current) use of opiate analgesic: Secondary | ICD-10-CM | POA: Insufficient documentation

## 2023-03-03 DIAGNOSIS — M5416 Radiculopathy, lumbar region: Secondary | ICD-10-CM | POA: Diagnosis not present

## 2023-03-03 DIAGNOSIS — M1611 Unilateral primary osteoarthritis, right hip: Secondary | ICD-10-CM | POA: Diagnosis not present

## 2023-03-03 NOTE — Patient Instructions (Signed)
Today, you had a urine drug screen and signed a controlled substance contract with our clinic.  Please call our office 1 week before running out of medication and let us know if your pain becomes intolerable or if you have emergency evaluation for pain.  You will need to bring all prescriptions to your next appointment with [place].  Results of urine drug screen typically take 5 days to result.  If results are consistent with your current medications, we will start a low-dose oxycodone to prevent Percocet 5 mg twice a day.  I will then follow-up with you in 1 month to see how things are going.  If no dose adjustments are needed, then we can extend visits out to every 3 to 6 months.  I will touch base with Dr. Earma Reading regarding any plans for epidural steroid injections to address the numbness and tingling in your right lower extremity, given that he is already sent you to PT for your back.  If no plans to address, will order a MRI of your low back through our office and plan for referral to Dr. Wynn Banker for injections.  Continue to work on weight loss, and let us know if you get bariatric surgery scheduled.  Postop pain control is the responsibility of your surgeon, but we will keep this in mind for picking up any needs after that and for drug screens during the postop period.

## 2023-03-03 NOTE — Progress Notes (Unsigned)
Subjective:    Patient ID: [name], female    DOB: 06-03-1976, 47 y.o.   MRN: [id number]  HPI  HPI  [name] is a 47 y.o. year old femaleyear old female  who  has a past medical history of Asthma, Hydradenitis, MRSA (methicillin resistant staph aureus) culture positive, and Obesity.   They are presenting to PM&R clinic as a new patient for pain management evaluation. They were referred by Dr. Earma Reading for treatment of right hip pain. She has severe OA pain and she needs to lose more weight; has gone from 500 lbs to about 310-320 lbs.   She states she has tried diet pills and had side effects, doing well with exercise and was recently referred for gastric sleeve.   Per their last note:  "... She will need a hip replacement before too long.  Unfortunately her BMI today is 46.  BMI needs to be under 40 for hip replacement. ... She does have some back pain and pain radiating to the anterior thigh that could be lumbar radiculopathy at L2 or L3.  She has less degenerative changes at these levels.  Physical therapy may be helpful for this.  Plan to refer to PT.   As for weight management it is essential for her to lose at least 40 pounds for hip replacement.  She is trying very hard and has lost weight successfully in the past.  She has a history of Graves' disease.  Will go ahead and check TSH and A1c.  She does have diabetes PCP could start GLP-1's which would help with weight loss and if her TSH is out of control that could explain some of her difficulty losing weight.  Will go ahead and check uric acid level as well for potential gout causing some of her pain.   Recheck in 1 month.  Tizanidine prescribed as well from some pain control at bedtime."  Source: R hip Inciting incident: none *** Red flag symptoms: {Blank multiple:[id number]::"***","No red flags for back pain endorsed in Hx or ROS","saddle anesthesia","loss of bowel or bladder continence","new weakness","new numbness/tingling","pain  waking up at nighttime.","Hx cancer ***","Hx chronic steroid use ***","Hx trauma ***","Hx Osteoporosis"}  Medications tried: Topical medications (no effect) : Used a cream several years ago, voltaren, without benefit Nsaids (no effect) : Ibu[profen 800- mg PRN for menstriuation; does not effect hip. Toradol IV also didn't help.  Tylenol  (no effect) : Was on TID for tooth pain; did not help Opiates  (mild effect) :  - Tramadol 50 mg PRN - took for menstrual cramps; not helpful for back or hip - Got percocet in the hospital 01/20/33 - took pain away for a few hours; took every 2.5 hours, helped with sleep  Gabapentin / Lyrica  (never tried) :  TCAs  (no effect) : Tried 04/2022, witout benefit SNRIs  (never tried) :  Other  (no effect) : Muscle relaxer without benefit; tizanidine and flexeril tried - Unable tpo get GLP-1 because she was not diabetic on recent labs.  Other treatments: PT/OT  (mild effect) : Currently in PT for her low back; helping with the back but it hurts her hip Accupuncture/chiropractor/massage  (no effect) : get massage from a friend TENs unit (never tried) :  Injections (no effect) : Got hip injection at [place], 2/27, utlrasound guided - lasted 3 days. Cannot get again until June f/u.  Surgery (never tried) :  Other  (mild effect) : Heat in the shower helps a little  bit.   Goals for pain control: To get her through to hip surgery Is disabled due to HS Watches her daughters' infants and young children so they can work  Prior UDS results: No results found for: "LABOPIA", "COCAINSCRNUR", "LABBENZ", "AMPHETMU", "THCU", "LABBARB"    Pain Inventory Average Pain 10 Pain Right Now 10 My pain is constant, sharp, dull, stabbing, tingling, and aching  In the last 24 hours, has pain interfered with the following? General activity 10 Relation with others 10 Enjoyment of life 10 What TIME of day is your pain at its worst? varies Sleep (in general) Fair  Pain is worse  with: walking, bending, sitting, inactivity, standing, and some activites Pain improves with:  nothing Relief from Meds: 10  walk without assistance how many minutes can you walk? 0 ability to climb steps?  yes do you drive?  yes Do you have any goals in this area?  yes  disabled: date disabled 2002 Do you have any goals in this area?  yes  weakness trouble walking  x-rays  New patient    Family History  Problem Relation Age of Onset   Diabetes Mother    Hypertension Mother    Diabetes Father    Asthma Other    Hyperlipidemia Other    Hypertension Other    Social History   Socioeconomic History   Marital status: Single    Spouse name: Not on file   Number of children: Not on file   Years of education: Not on file   Highest education level: Not on file  Occupational History   Not on file  Tobacco Use   Smoking status: Some Days    Packs/day: .33    Types: Cigarettes   Smokeless tobacco: Never  Vaping Use   Vaping Use: Never used  Substance and Sexual Activity   Alcohol use: Yes    Comment: once a month at a party beer wine or mixed drinks   Drug use: No   Sexual activity: Yes    Birth control/protection: None  Other Topics Concern   Not on file  Social History Narrative   Not on file   Social Determinants of Health   Financial Resource Strain: Not on file  Food Insecurity: Not on file  Transportation Needs: Not on file  Physical Activity: Not on file  Stress: Not on file  Social Connections: Not on file   Past Surgical History:  Procedure Laterality Date   CESAREAN SECTION     x2   TUBAL LIGATION     WISDOM TOOTH EXTRACTION  2011   Past Medical History:  Diagnosis Date   Asthma    Hydradenitis    MRSA (methicillin resistant staph aureus) culture positive    Obesity    There were no vitals taken for this visit.  Opioid Risk Score:   Fall Risk Score:  `1  [facility]     01/03/2023    9:15 AM 06/22/2021    2:12 PM   Depression screen PHQ 2/9  Decreased Interest 0 3  Down, Depressed, Hopeless 0 3  PHQ - 2 Score 0 6  Altered sleeping 0 2  Tired, decreased energy 0 3  Change in appetite 0 0  Feeling bad or failure about yourself  0 1  Trouble concentrating 0 0  Moving slowly or fidgety/restless 0 0  Suicidal thoughts 0 1  PHQ-9 Score 0 13     Review of Systems  Musculoskeletal:  Positive for  back pain.       RT hip and leg pain  All other systems reviewed and are negative.      Objective:   Physical Exam   PE: Constitution: Appropriate appearance for age. No apparent distress  +Obese Resp: No respiratory distress. No accessory muscle usage. on RA Cardio: Well perfused appearance. No peripheral edema. Abdomen: Nondistended. Nontender.   Psych: Appropriate mood and affect. Neuro: AAOx4. No apparent cognitive deficits   Neurologic Exam:   DTRs: Reflexes were 2+ in bilateral achilles, patella, biceps, BR and triceps. Babinsky: flexor responses b/l.   Hoffmans: negative b/l Sensory exam: revealed normal sensation in all dermatomal regions in left lower extremity and with reduced sensation to light touch in right gastoc, medial malleolus, medial and lateral knee, popliteal fossa, and distal anterior thigh Motor exam: strength 5/5 throughout left lower extremity and with exception of RLE hip flexion 4/5, abduction and adduction 4/5; 5/5 KE, PF, DF Coordination: Fine motor coordination was normal.   Gait: +antalgic gait         Assessment & Plan:

## 2023-03-04 ENCOUNTER — Ambulatory Visit: Payer: 59 | Admitting: Physical Therapy

## 2023-03-04 ENCOUNTER — Encounter: Payer: Self-pay | Admitting: Physical Therapy

## 2023-03-04 DIAGNOSIS — G8929 Other chronic pain: Secondary | ICD-10-CM

## 2023-03-04 DIAGNOSIS — M545 Low back pain, unspecified: Secondary | ICD-10-CM | POA: Diagnosis not present

## 2023-03-04 DIAGNOSIS — M6281 Muscle weakness (generalized): Secondary | ICD-10-CM

## 2023-03-04 NOTE — Therapy (Signed)
OUTPATIENT PHYSICAL THERAPY TREATMENT NOTE   Patient Name: [name] MRN: [id number] DOB:05-07-1976, 47 y.o., female Today's Date: 03/04/2023  PCP: System, Provider Not In  REFERRING PROVIDER: Rodolph Bong, MD   END OF SESSION:   PT End of Session - 03/04/23 1017     Visit Number 4    Number of Visits 16    Date for PT Re-Evaluation 04/03/23    Authorization Type U[employer] MCR FOTO 6th and 10th visit    PT Start Time 1017    PT Stop Time 1055    PT Time Calculation (min) 38 min              Past Medical History:  Diagnosis Date   Asthma    Hydradenitis    MRSA (methicillin resistant staph aureus) culture positive    Obesity    Past Surgical History:  Procedure Laterality Date   CESAREAN SECTION     x2   TUBAL LIGATION     WISDOM TOOTH EXTRACTION  2011   Patient Active Problem List   Diagnosis Date Noted   Left lumbar radiculopathy 03/03/2023   Morbid obesity 03/03/2023   Chronic pain syndrome 03/03/2023   Hip arthritis 02/25/2023   Irregular periods 01/29/2023   Encounter for fertility planning 01/29/2023   Primary osteoarthritis of right hip 01/21/2023   Graves disease 07/06/2019   Hyperthyroidism 07/06/2019   Current smoker 05/26/2019   Seasonal and perennial allergic rhinitis 03/25/2018   Anaphylactic shock due to adverse food reaction 08/19/2017   Perennial and seasonal allergic rhinitis 08/19/2017   Moderate persistent asthma without complication 08/19/2017    REFERRING DIAG: M54.41,G89.29 (ICD-10-CM) - Chronic right-sided low back pain with right-sided sciatica   THERAPY DIAG:  Chronic right-sided low back pain, unspecified whether sciatica present  Muscle weakness (generalized)  Rationale for Evaluation and Treatment Rehabilitation  PERTINENT HISTORY: Asthma, MRSA +, obesity, Grave's dz, hydrdenitis   PRECAUTIONS: shellfish allergy  SUBJECTIVE:                                                                                                                                                                                       SUBJECTIVE STATEMENT: I've been doing the exercises. I notice I can stand a little more without my back hurting. I saw pain mgmt doctor who recommended injections in my back.   I feel good right now.     EVAL -My doctor sent me here for my back, I have problems with my RT hip but I can't have surgery until I lose 40 pounds.  I have been having back problems for a long time 3 years but has  worsened in the last 3 months.  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 I cannot leave [name] alone, she must be with me at all times.   PAIN:  Are you having pain? Yes: NPRS scale: 5/10  Pain location: lower back into RT  buttock and stays Pain description: sharp pain in lower middle in the center and it radiating into my RT hip Aggravating factors: If I stand more than 10 minutes, going up steps my back is burning. Relieving factors: nothing Holding babies hard, no more than 5 minutes standing, I cannot sleep on back at night   OBJECTIVE: (objective measures completed at initial evaluation unless otherwise dated)   DIAGNOSTIC FINDINGS:  FINDINGS: Please note there are 4 non-rib-bearing lumbar vertebra. The lower most lumbar vertebra will be labeled L5. Insert straightening of normal lordosis. There is trace anterolisthesis of L4 on L5 that is likely facet mediated. Moderate disc space narrowing and spurring at L5-S1, mild disc space narrowing and spurring at L4-L5. Moderate L4-L5 and L5-S1 facet hypertrophy. Normal vertebral body heights. No evidence of fracture or focal bone abnormalities. Degenerative change of both sacroiliac joints. Soft tissue attenuation from habitus limits detailed assessment.   IMPRESSION: 1. 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 2. Trace anterolisthesis of L4 on L5, likely facet mediated. 3. Please note there are 4 non-rib-bearing lumbar vertebra, the lower most lumbar vertebra was labeled L5.     Electronically Signed   By: Narda RutherfordMelanie  Sanford M.D.   On: 01/22/2023 12:45   FINDINGS: There is acetabular over coverage. Osseous bump at the femoral head neck junction. Moderate right hip joint space narrowing and spurring. Acetabular sclerosis may be degenerative. No evidence of fracture, erosion or avascular necrosis. Left hip joint space narrowing and spurring to a lesser extent. Mild degenerative change of the sacroiliac joints and pubic symphysis.   IMPRESSION: 1. 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 2. Right hip acetabular over coverage may contribute to impingement.     Electronically Signed   By: Narda RutherfordMelanie  Sanford M.D.   On: 01/22/2023 12:43   PATIENT SURVEYS:  FOTO 34% predicted  56%     SCREENING FOR RED FLAGS: Bowel or bladder incontinence: No Cauda equina syndrome: No     COGNITION: Overall cognitive status: Within functional limits for tasks assessed                          SENSATION: WFL   MUSCLE LENGTH: Hamstrings: Right 60 deg; Left 60 deg Thomas test: Right -30 deg; Left 22 deg   POSTURE: rounded shoulders, forward head, and anterior pelvic tilt  pelvic level higher my 2 finger widths on RT Obesity, and excess adipose tissue /pannus PALPATION: TTP over RT QL    LUMBAR ROM:    AROM eval  Flexion Fingertips to midshin no pain  Extension 30 with pinching pain on the RT  Right lateral flexion Pain with concordant RT back pain  Left lateral flexion Finger to knee jt line  Right rotation 50% pain  Left rotation 50%   (Blank rows = not tested)   LOWER EXTREMITY ROM:      Active  Right eval Left eval  Hip flexion 60 60  Hip extension  Hip abduction      Hip adduction      Hip  internal rotation 18 p! 21  Hip external rotation 19p! 25  Knee flexion      Knee extension      Ankle dorsiflexion      Ankle plantarflexion      Ankle inversion      Ankle eversion       (Blank rows = not tested)   LOWER EXTREMITY MMT:   Pain with Rt hip motion   MMT Right eval Left eval  Hip flexion 3- 3-  Hip extension 3- 4-  Hip abduction 3- 3-  Hip adduction      Hip internal rotation      Hip external rotation      Knee flexion 4 4  Knee extension 4 4  Ankle dorsiflexion      Ankle plantarflexion      Ankle inversion      Ankle eversion       (Blank rows = not tested)   LUMBAR SPECIAL TESTS:  Straight leg raise test: Negative and Slump test: Negative   FUNCTIONAL TESTS:  5 times sit to stand: 30.4 sec 6 minute walk test: 02/25/23: 1015 feet   GAIT: Distance walked: 150 ft Assistive device utilized: None Level of assistance: Complete Independence Comments: antalgic gait on RT   TODAY'S TREATMENT: [facility] Adult PT Treatment:                                                DATE: 03/04/23 Therapeutic Exercise: Nustep L5 UE/LE x 5 minutes Heel raises x15 Gastroc stretch  2 x 30 sec  each Hip abduction 10 x 1 each Hip ext 10 x 1 each Alt high march x 10 each  SKTC 10 sec x 3  LTR 20 sec x 5  PPT 10 reps x 2  Bridge  x 10 STS AMRAP  x 30 Seated scap squeeze Seated pelvic tilits Hamstring stretches seated     TREATMENT 02/27/23: See note for aquatics treatment   Aquatic therapy at [facility]- [address] - therapeutic pool temp 92 degrees Pt enters building independently.  Treatment took place in water 3.8 to  4 ft 8 in.feet deep depending upon activity.  Pt entered and exited the pool via stair and handrails       [facility] Adult PT Treatment:                                                DATE: 02-25-23  1015 ft (1635-1980 ft) Therapeutic Exercise: STS 2 x 10  then last set AMRAP then Hamstring stretch bil 30 sec between sets with VC and TC STS  AMRAP  23 Hamstring stretch 30 sec RT and LT Heel raise  Bil 25 Gastroc stretch standing with hands on wall 30 sec on RT and LT PPT x 15 Supine SKTC  5 reps - 10 hold Supine Lower Trunk Rotation 5 reps 20 sec hold Bridge 2 x 10 Supine hip abduction 2 x 10 with RTB  Self Care: Walking program explanation and explanation of DOMS and aftercare    02-06-23  eval, issue HEP and TPDN  DATE: Trigger Point Dry Needling Treatment: Pre-treatment instruction: Patient instructed on dry needling rationale, procedures, and possible side effects including pain during treatment (achy,cramping feeling), bruising, drop of blood, lightheadedness, nausea, sweating. Patient Consent Given: Yes Education handout provided: Yes Muscles treated: RT QL  Needle size and number:  105 x .4mm Electrical stimulation performed: No Parameters: N/A Treatment response/outcome: Twitch response elicited and Palpable decrease in muscle tension Post-treatment instructions: Patient instructed to expect possible mild to moderate muscle soreness later today and/or tomorrow. Patient instructed in methods to reduce muscle soreness and to continue prescribed HEP. If patient was dry needled over the lung field, patient was instructed on signs and symptoms of pneumothorax and, however unlikely, to see immediate medical attention should they occur. Patient was also educated on signs and symptoms of infection and to seek medical attention should they occur. Patient verbalized understanding of these instructions and education.     Asterisk Signs Eval 02-06-23 02-25-23           5 x  STS 30.4 sec            Max pain 10/10 verbalized 11/10               1015 ft                                           PATIENT EDUCATION:  Education details: POC, Informed consent,TPDN, FOTO review and issue HEP Person educated:  Patient and pt is gaurdian of mother with two children present she must oversee legally Education method: Explanation, Demonstration, Tactile cues, Verbal cues, and Handouts Education comprehension: verbalized understanding, returned demonstration, verbal cues required, tactile cues required, and needs further education   HOME EXERCISE PROGRAM:   Access Code: [email or web address] URL: [email or web address] Date: 02/25/2023 Prepared by: Garen Lah  Program Notes Toe yoga  Exercises - Supine Posterior Pelvic Tilt  - 1 x daily - 7 x weekly - 3 sets - 10 reps - Supine Single Knee to Chest Stretch  - 2 x daily - 7 x weekly - 1 sets - 5 reps - 10 hold - Supine Lower Trunk Rotation  - 2 x daily - 7 x weekly - 1 sets - 5 reps - 20 hold - Supine Bridge  - 1 x daily - 7 x weekly - 3 sets - 10 reps - Sit to stand with sink support Movement snack  - 1 x daily - 7 x weekly - 3 sets - 10 reps - Seated Hamstring Stretch  - 1 x daily - 7 x weekly - 1 sets - 2 reps - 30 sec hold - Standing Heel Raise with Support  - 1 x daily - 7 x weekly - 31 sets - 25 reps - Standing Gastroc Stretch  - 1 x daily - 7 x weekly - 1 sets - 4 reps - 30 sec hold ASSESSMENT:   CLINICAL IMPRESSION: Pt reports improvement in pain with standing. She is compliant with HEP and Independent. STG #1 met. Pain level reduced to 5/10, STG# 2 met. Began discussion of posture and body mechanics with lifting and ADLS. Progressed hip strengthening to standing 3 way hip with some increased pain reported in low back afterward. Reviewed HEP stretches which decreased her back pain. STS AMRAP reached 30 reps today. She is eager to improve. Will plan to update HEP next session with primary PT.  Pt would benefit from aquatics and skilled PT to address impairments that are affecting daily life, ability to negotiate step and care for young children in her home.   EVAL-Patient is a 47 y.o. female who was seen today for physical therapy  evaluation and treatment for chronic RT side back pain with radiculopathy into buttocks. Pt also has RT hip pain that radiates into groin and is hoping for eventual THA once she loses 40 pounds. Pt would benefit from aquatics and skilled PT to address impairments that are affecting daily life, ability to negotiate step and care for young children in her home.    OBJECTIVE IMPAIRMENTS: difficulty walking, decreased ROM, decreased strength, hypomobility, impaired flexibility, improper body mechanics, postural dysfunction, obesity, and pain.    ACTIVITY LIMITATIONS: carrying, lifting, bending, standing, squatting, sleeping, stairs, transfers, locomotion level, and caring for others   PARTICIPATION LIMITATIONS: meal prep, cleaning, laundry, shopping, community activity, and caring for others   PERSONAL FACTORS: Asthma, MRSA +, obesity, Grave's dz, hydrdenitis are also affecting patient's functional outcome.    REHAB POTENTIAL: Good   CLINICAL DECISION MAKING: Evolving/moderate complexity   EVALUATION COMPLEXITY: Moderate     GOALS: Goals reviewed with patient? Yes   SHORT TERM GOALS: Target date: 03-06-23   Pt will be independent with initial HEP Baseline:no knowledge 03/04/23: I and compliant  Goal status: MET    2.  Pt with 7/10 pain at rest. Baseline: 7/10  03/04/23: 5/10 Goal status: MET    3.  Demonstrate understanding of neutral posture and be more conscious of position and posture throughout the day.  Baseline: no knowledge Goal status:ONGOING       LONG TERM GOALS: Target date: 04-03-23   Pt will be able to stand for 30 minutes to care for infant grandchildren  Baseline: unable to stand for 5 min Goal status: INITIAL   2.  Pt will be able to perform household chores with pain level 3/10 or less Baseline: unable to stand for 5 min, unable to do chores Goal status: INITIAL   3.  Pt will be able to sleep for 5 or more hours consecutive sleep Baseline: waking every 2  hours Goal status: INITIAL   4.  Pt will be able to get on ground to play and care for grandchildren Baseline: unable to transfer and rise from ground Goal status: INITIAL   5.  FOTO will improve from  34%  to   56%  indicating improved functional mobility.  Baseline: eval 34 % Goal status: INITIAL   6.  Demonstrate 4/5 RT LE strength to improve stability, safety and endurance of community level function Baseline: Pt with demonstrated weakness with 30 sec 5 x STS Goal status: INITIAL   7. Pt will be able to negotiate steps with pain 3/10 or less            Baseline: Pt unable to negotiate steps without 10/10 pain            Goal Status, INITIAL   PLAN:   PT FREQUENCY: 1-2x/week   PT DURATION: 8 weeks   PLANNED INTERVENTIONS: Therapeutic exercises, Therapeutic activity, Neuromuscular re-education, Balance training, Gait training, Patient/Family education, Self Care, Joint mobilization, Stair training, Aquatic Therapy, Dry Needling, Electrical stimulation, Spinal mobilization, Cryotherapy, Moist heat, Taping, Ionotophoresis 4mg /ml Dexamethasone, Manual therapy, and Re-evaluation.   PLAN FOR NEXT SESSION: Assess TPDN and review and progress exercise   Royden Purl PTA 03/04/23 10:55 AM Phone: [phone] Fax: [phone]

## 2023-03-05 ENCOUNTER — Ambulatory Visit (INDEPENDENT_AMBULATORY_CARE_PROVIDER_SITE_OTHER): Payer: 59 | Admitting: Dermatology

## 2023-03-05 VITALS — BP 140/79 | HR 65

## 2023-03-05 DIAGNOSIS — Z79899 Other long term (current) drug therapy: Secondary | ICD-10-CM | POA: Diagnosis not present

## 2023-03-05 DIAGNOSIS — L732 Hidradenitis suppurativa: Secondary | ICD-10-CM

## 2023-03-05 LAB — TOXASSURE SELECT,+ANTIDEPR,UR

## 2023-03-05 NOTE — Patient Instructions (Addendum)
Continue loading dose of Cosentyx as directed then go to monthly dosing of 300 mg. Continue Clindamycin lotion as directed.    Reviewed risks of biologics including immunosuppression, infections, injection site reaction, and failure to improve condition. Goal is control of skin condition, not cure.  Some older biologics such as Humira and Enbrel may slightly increase risk of malignancy and may worsen congestive heart failure.  Taltz and Cosentyx may cause inflammatory bowel disease to flare. The use of biologics requires long term medication management, including periodic office visits and monitoring of blood work.    Due to recent changes in healthcare laws, you may see results of your pathology and/or laboratory studies on MyChart before the doctors have had a chance to review them. We understand that in some cases there may be results that are confusing or concerning to you. Please understand that not all results are received at the same time and often the doctors may need to interpret multiple results in order to provide you with the best plan of care or course of treatment. Therefore, we ask that you please give [place] 2 business days to thoroughly review all your results before contacting the office for clarification. Should we see a critical lab result, you will be contacted sooner.   If You Need Anything After Your Visit  If you have any questions or concerns for your doctor, please call our main line at [phone] and press option 4 to reach your doctor's medical assistant. If no one answers, please leave a voicemail as directed and we will return your call as soon as possible. Messages left after 4 pm will be answered the following business day.   You may also send [place] a message via MyChart. We typically respond to MyChart messages within 1-2 business days.  For prescription refills, please ask your pharmacy to contact our office. Our fax number is [phone].  If you have an urgent issue  when the clinic is closed that cannot wait until the next business day, you can page your doctor at the number below.    Please note that while we do our best to be available for urgent issues outside of office hours, we are not available 24/7.   If you have an urgent issue and are unable to reach [place], you may choose to seek medical care at your doctor's office, retail clinic, urgent care center, or emergency room.  If you have a medical emergency, please immediately call 911 or go to the emergency department.  Pager Numbers  - Dr. Gwen Pounds: [phone]  - Dr. Neale Burly: [phone]  - Dr. Roseanne Reno: [phone]  In the event of inclement weather, please call our main line at [phone] for an update on the status of any delays or closures.  Dermatology Medication Tips: Please keep the boxes that topical medications come in in order to help keep track of the instructions about where and how to use these. Pharmacies typically print the medication instructions only on the boxes and not directly on the medication tubes.   If your medication is too expensive, please contact our office at [phone] option 4 or send [place] a message through MyChart.   We are unable to tell what your co-pay for medications will be in advance as this is different depending on your insurance coverage. However, we may be able to find a substitute medication at lower cost or fill out paperwork to get insurance to cover a needed medication.   If a prior authorization is required  to get your medication covered by your insurance company, please allow [place] 1-2 business days to complete this process.  Drug prices often vary depending on where the prescription is filled and some pharmacies may offer cheaper prices.  The website www.goodrx.com contains coupons for medications through different pharmacies. The prices here do not account for what the cost may be with help from insurance (it may be cheaper with your insurance),  but the website can give you the price if you did not use any insurance.  - You can print the associated coupon and take it with your prescription to the pharmacy.  - You may also stop by our office during regular business hours and pick up a GoodRx coupon card.  - If you need your prescription sent electronically to a different pharmacy, notify our office through [facility] or by phone at [phone] option 4.     Si Usted Necesita Algo Despus de Su Visita  Tambin puede enviarnos un mensaje a travs de Clinical cytogeneticist. Por lo general respondemos a los mensajes de MyChart en el transcurso de 1 a 2 das hbiles.  Para renovar recetas, por favor pida a su farmacia que se ponga en contacto con nuestra oficina. Annie Sable de fax es [place] [phone].  Si tiene un asunto urgente cuando la clnica est cerrada y que no puede esperar hasta el siguiente da hbil, puede llamar/localizar a su doctor(a) al nmero que aparece a continuacin.   Por favor, tenga en cuenta que aunque hacemos todo lo posible para estar disponibles para asuntos urgentes fuera del horario de [place], no estamos disponibles las 24 horas del da, los 7 [address]  [address] de la [place].   Si tiene un problema urgente y no puede comunicarse con nosotros, puede optar por buscar atencin mdica  en el consultorio de su doctor(a), en una clnica privada, en un centro de atencin urgente o en una sala de emergencias.  Si tiene Engineer, drilling, por favor llame inmediatamente al 911 o vaya a la sala de emergencias.  Nmeros de bper  - Dr. Gwen Pounds: [phone]  - Dra. Moye: [phone]  - Dra. Roseanne Reno: [phone]  En caso de inclemencias del [place], por favor llame a Lacy Duverney principal al [phone] para una actualizacin sobre el [place] de cualquier retraso o cierre.  Consejos para la medicacin en dermatologa: Por favor, guarde las cajas en las que vienen los medicamentos de uso tpico para ayudarle a seguir las  instrucciones sobre dnde y cmo usarlos. Las farmacias generalmente imprimen las instrucciones del medicamento slo en las cajas y no directamente en los tubos del [place].   Si su medicamento es muy caro, por favor, pngase en contacto con Rolm Gala llamando al [phone] y presione la opcin 4 o envenos un mensaje a travs de Clinical cytogeneticist.   No podemos decirle cul ser su copago por los medicamentos por adelantado ya que esto es diferente dependiendo de la cobertura de su seguro. Sin embargo, es posible que podamos encontrar un medicamento sustituto a Audiological scientist un formulario para que el seguro cubra el medicamento que se considera necesario.   Si se requiere una autorizacin previa para que su compaa de seguros [place] su medicamento, por favor permtanos de 1 a 2 das hbiles para completar [address]  Los precios de los medicamentos varan con frecuencia dependiendo del Environmental consultant de dnde se surte la receta y alguna farmacias pueden ofrecer precios ms baratos.  El sitio web www.goodrx.com tiene cupones para medicamentos de [facility]  farmacias. Los precios aqu no tienen en cuenta lo que podra costar con la ayuda del seguro (puede ser ms barato con su seguro), pero el sitio web puede darle el precio si no utiliz Research scientist (physical sciences).  - Puede imprimir el cupn correspondiente y llevarlo con su receta a la farmacia.  - Tambin puede pasar por nuestra oficina durante el horario de atencin regular y Charity fundraiser una tarjeta de cupones de GoodRx.  - Si necesita que su receta se enve electrnicamente a una farmacia diferente, informe a nuestra oficina a travs de MyChart de [place] o por telfono llamando al [phone] y presione la opcin 4.

## 2023-03-05 NOTE — Progress Notes (Signed)
   Follow Up Visit   Subjective  [name] is a 47 y.o. female who presents for the following: HS. Started Cosentyx 02/17/2023. Has been injecting herself at home. Only 4 syringes were sent, she does not have enough to finish out the 5 week loading dose. Has already noticed some improvement. States lesions are not rupturing and draining as badly. No new lesions. She did get a yeast infection from the minocycline and was treated with fluconazole.   The following portions of the chart were reviewed this encounter and updated as appropriate: medications, allergies, medical history  Review of Systems:  No other skin or systemic complaints except as noted in HPI or Assessment and Plan.  Objective  Well appearing patient in no apparent distress; mood and affect are within normal limits.  A focused examination was performed of the following areas:  Breast folds, axillae  Relevant exam findings are noted in the Assessment and Plan.    Assessment & Plan     HIDRADENITIS SUPPURATIVA Exam: ulcerated pap on the right and left inf breast with drainage, numerous hyperpigmented scarred macules and nodules and plaques of the upper abdomen and breast.   Chronic and persistent condition with duration or expected duration over one year. Condition is symptomatic/ bothersome to patient. Not currently at goal.   Hidradenitis Suppurativa is a chronic; persistent; non-curable, but treatable condition due to abnormal inflamed sweat glands in the body folds (axilla, inframammary, groin, medial thighs), causing recurrent painful draining cysts and scarring. It can be associated with severe scarring acne and cysts; also abscesses and scarring of scalp. The goal is control and prevention of flares, as it is not curable. Scars are permanent and can be thickened. Treatment may include daily use of topical medication and oral antibiotics.  Oral isotretinoin may also be helpful.  For some cases, Humira or  Cosentyx (biologic injections) may be prescribed to decrease the inflammatory process and prevent flares.  When indicated, inflamed cysts may also be treated surgically.  Treatment Plan: Continue loading dose of Cosentyx as directed then go to monthly dosing of 300 mg.  May increase to q2wks if necessary. Pt will contact [facility] to arrange for next shipment.  Continue Clindamycin lotion qd as directed.  Continue minocycline 100 mg qd, and bid prn flares Fluconazole 150 mg PO x 1 prn yeast infection  Cosentyx 300 mg/74ml pen sample given today to take home for next injection.  NDC 8177-1165-79 Lot: [id number] Exp: 06/2024  Reviewed risks of biologics including immunosuppression, infections, injection site reaction, and failure to improve condition. Goal is control of skin condition, not cure.  Some older biologics such as Humira and Enbrel may slightly increase risk of malignancy and may worsen congestive heart failure.  Taltz and Cosentyx may cause inflammatory bowel disease to flare. The use of biologics requires long term medication management, including periodic office visits and monitoring of blood work.    Return for Follow Up As Scheduled for HS/Cosentyx.  I, Lawson Radar, CMA, am acting as scribe for Willeen Niece, MD.   Documentation: I have reviewed the above documentation for accuracy and completeness, and I agree with the above.  Willeen Niece, MD

## 2023-03-05 NOTE — Therapy (Signed)
OUTPATIENT PHYSICAL THERAPY TREATMENT NOTE   Patient Name: [name] MRN: [id number] DOB:[birth year], 47 y.o., female Today's Date: 03/06/2023  PCP: System, Provider Not In  REFERRING PROVIDER: Rodolph Bong, MD   END OF SESSION:   PT End of Session - 03/06/23 1016     Visit Number 5    Number of Visits 16    Date for PT Re-Evaluation 04/03/23    Authorization Type [employer] MCR FOTO 6th and 10th visit    PT Start Time 1017    PT Stop Time 1100    PT Time Calculation (min) 43 min    Activity Tolerance Patient tolerated treatment well    Behavior During Therapy WFL for tasks assessed/performed               Past Medical History:  Diagnosis Date   Asthma    Hydradenitis    MRSA (methicillin resistant staph aureus) culture positive    Obesity    Past Surgical History:  Procedure Laterality Date   CESAREAN SECTION     x2   TUBAL LIGATION     WISDOM TOOTH EXTRACTION  2011   Patient Active Problem List   Diagnosis Date Noted   Left lumbar radiculopathy 03/03/2023   Morbid obesity 03/03/2023   Chronic pain syndrome 03/03/2023   Hip arthritis 02/25/2023   Irregular periods 01/29/2023   Encounter for fertility planning 01/29/2023   Primary osteoarthritis of right hip 01/21/2023   Graves disease 07/06/2019   Hyperthyroidism 07/06/2019   Current smoker 05/26/2019   Seasonal and perennial allergic rhinitis 03/25/2018   Anaphylactic shock due to adverse food reaction 08/19/2017   Perennial and seasonal allergic rhinitis 08/19/2017   Moderate persistent asthma without complication 08/19/2017    REFERRING DIAG: M54.41,G89.29 (ICD-10-CM) - Chronic right-sided low back pain with right-sided sciatica   THERAPY DIAG:  Chronic right-sided low back pain, unspecified whether sciatica present  Pain in right hip  Muscle weakness (generalized)  Abnormal posture  Difficulty in walking, not elsewhere classified  Rationale for Evaluation and Treatment  Rehabilitation  PERTINENT HISTORY: Asthma, MRSA +, obesity, Grave's dz, hydrdenitis   PRECAUTIONS: shellfish allergy  SUBJECTIVE:                                                                                                                                                                                      SUBJECTIVE STATEMENT:  I was a 3-4/10  and then I did the 6 MWT  pain was 8/10 in LT low back. My RT is doing better    EVAL -My doctor sent me here for my back, I have problems with my RT hip  but I can't have surgery until I lose 40 pounds.  I have been having back problems for a long time 3 years but has worsened in the last 3 months.  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 I cannot leave [name] alone, she must be with me at all times.   PAIN:  Are you having pain? Yes: NPRS scale: 5/10  Pain location: lower back into RT  buttock and stays Pain description: sharp pain in lower middle in the center and it radiating into my RT hip Aggravating factors: If I stand more than 10 minutes, going up steps my back is burning. Relieving factors: nothing Holding babies hard, no more than 5 minutes standing, I cannot sleep on back at night   OBJECTIVE: (objective measures completed at initial evaluation unless otherwise dated)   DIAGNOSTIC FINDINGS:  FINDINGS: Please note there are 4 non-rib-bearing lumbar vertebra. The lower most lumbar vertebra will be labeled L5. Insert straightening of normal lordosis. There is trace anterolisthesis of L4 on L5 that is likely facet mediated. Moderate disc space narrowing and spurring at L5-S1, mild disc space narrowing and spurring at L4-L5. Moderate L4-L5 and L5-S1 facet hypertrophy. Normal vertebral body heights. No evidence of fracture or focal bone abnormalities. Degenerative change of both sacroiliac joints.  Soft tissue attenuation from habitus limits detailed assessment.   IMPRESSION: 1. 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 2. Trace anterolisthesis of L4 on L5, likely facet mediated. 3. Please note there are 4 non-rib-bearing lumbar vertebra, the lower most lumbar vertebra was labeled L5.     Electronically Signed   By: Destiny RutherfordMelanie  Combs M.D.   On: 01/22/2023 12:45   FINDINGS: There is acetabular over coverage. Osseous bump at the femoral head neck junction. Moderate right hip joint space narrowing and spurring. Acetabular sclerosis may be degenerative. No evidence of fracture, erosion or avascular necrosis. Left hip joint space narrowing and spurring to a lesser extent. Mild degenerative change of the sacroiliac joints and pubic symphysis.   IMPRESSION: 1. 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 2. Right hip acetabular over coverage may contribute to impingement.     Electronically Signed   By: Destiny RutherfordMelanie  Combs M.D.   On: 01/22/2023 12:43   PATIENT SURVEYS:  FOTO 34% predicted  56%     SCREENING FOR RED FLAGS: Bowel or bladder incontinence: No Cauda equina syndrome: No     COGNITION: Overall cognitive status: Within functional limits for tasks assessed                          SENSATION: WFL   MUSCLE LENGTH: Hamstrings: Right 60 deg; Left 60 deg Thomas test: Right -30 deg; Left 22 deg   POSTURE: rounded shoulders, forward head, and anterior pelvic tilt  pelvic level higher my 2 finger widths on RT Obesity, and excess adipose tissue /pannus PALPATION: TTP over RT QL    LUMBAR ROM:    AROM eval  Flexion Fingertips to midshin no pain  Extension 30 with pinching pain on the RT  Right lateral flexion Pain with concordant RT back pain  Left lateral flexion Finger to knee jt line  Right rotation 50% pain  Left rotation 50%   (Blank rows = not  tested)   LOWER  EXTREMITY ROM:      Active  Right eval Left eval  Hip flexion 60 60  Hip extension      Hip abduction      Hip adduction      Hip internal rotation 18 p! 21  Hip external rotation 19p! 25  Knee flexion      Knee extension      Ankle dorsiflexion      Ankle plantarflexion      Ankle inversion      Ankle eversion       (Blank rows = not tested)   LOWER EXTREMITY MMT:   Pain with Rt hip motion   MMT Right eval Left eval  Hip flexion 3- 3-  Hip extension 3- 4-  Hip abduction 3- 3-  Hip adduction      Hip internal rotation      Hip external rotation      Knee flexion 4 4  Knee extension 4 4  Ankle dorsiflexion      Ankle plantarflexion      Ankle inversion      Ankle eversion       (Blank rows = not tested)   LUMBAR SPECIAL TESTS:  Straight leg raise test: Negative and Slump test: Negative   FUNCTIONAL TESTS:  5 times sit to stand: 30.4 sec 6 minute walk test: 02/25/23: 1015 feet  6 MW T  03-06-23  1202 ft 5 x STS 13.0 sec    GAIT: Distance walked: 150 ft Assistive device utilized: None Level of assistance: Complete Independence Comments: antalgic gait on RT   TODAY'S TREATMENT: [facility] Adult PT Treatment:                                                DATE: 03-06-23   ft (1635-1980 ft) Therapeutic Exercise: Therapeutic Exercise: SKTC 10 sec x 3  LTR 20 sec x 5  PPT 10 reps x 2  Bridge  x 10 Hip abduction 10 x 1 each Hip ext 10 x 1 each Heel raises x15 STS with 25 # 2 x 10 SELF CARE   posture and body mechanics with handout   Demo proper lift and deadlift of 25 #   ADL's Manual Therapy:  STW-  LT QL and gluteal soft tissue  Myofascial release of LT QL Trigger Point Dry Needling Treatment: Pre-treatment instruction: Patient instructed on dry needling rationale, procedures, and possible side effects including pain during treatment (achy,cramping feeling), bruising, drop of blood, lightheadedness, nausea, sweating. Patient Consent Given: Yes Education  handout provided: Yes Muscles treated: LT  QL  and  gluteal Needle size and number:  105 x .40mm Electrical stimulation performed: No Parameters: N/A Treatment response/outcome: Twitch response elicited and Palpable decrease in muscle tension Post-treatment instructions: Patient instructed to expect possible mild to moderate muscle soreness later today and/or tomorrow. Patient instructed in methods to reduce muscle soreness and to continue prescribed HEP. Patient was also educated on signs and symptoms of infection and to seek medical attention should they occur. Patient verbalized understanding of these instructions and education.    [facility] Adult PT Treatment:  DATE: 03/04/23 Therapeutic Exercise: Nustep L5 UE/LE x 5 minutes Heel raises x15 Gastroc stretch  2 x 30 sec  each Hip abduction 10 x 1 each Hip ext 10 x 1 each Alt high march x 10 each  SKTC 10 sec x 3  LTR 20 sec x 5  PPT 10 reps x 2  Bridge  x 10 STS AMRAP  x 30 Seated scap squeeze Seated pelvic tilits Hamstring stretches seated     TREATMENT 02/27/23: See note for aquatics treatment   Pt cancelled 02/27/23   Asterisk Signs Eval 02-06-23 02-25-23  03-06-23         5 x  STS 30.4 sec   13.0         Max pain 10/10 verbalized 11/10   8/10            1015 ft  1202 ft                                         PATIENT EDUCATION:  Education details: POC, Informed consent,TPDN, FOTO review and issue HEP Person educated: Patient and pt is gaurdian of mother with two children present she must oversee legally Education method: Explanation, Demonstration, Tactile cues, Verbal cues, and Handouts Education comprehension: verbalized understanding, returned demonstration, verbal cues required, tactile cues required, and needs further education   HOME EXERCISE PROGRAM: Access Code: [email or web address] URL: [email or web address] Date: 03/06/2023 Prepared by: Garen Lah  Program  Notes Toe yoga  Exercises - Supine Posterior Pelvic Tilt  - 1 x daily - 7 x weekly - 3 sets - 10 reps - Supine Single Knee to Chest Stretch  - 2 x daily - 7 x weekly - 1 sets - 5 reps - 10 hold - Supine Lower Trunk Rotation  - 2 x daily - 7 x weekly - 1 sets - 5 reps - 20 hold - Supine Bridge  - 1 x daily - 7 x weekly - 3 sets - 10 reps - Sit to stand with sink support Movement snack  - 1 x daily - 7 x weekly - 3 sets - 10 reps - Seated Hamstring Stretch  - 1 x daily - 7 x weekly - 1 sets - 2 reps - 30 sec hold - Standing Heel Raise with Support  - 1 x daily - 7 x weekly - 31 sets - 25 reps - Standing Gastroc Stretch  - 1 x daily - 7 x weekly - 1 sets - 4 reps - 30 sec hold - Sidelying Hip Abduction  - 1 x daily - 7 x weekly - 3 sets - 10 reps  ASSESSMENT:   CLINICAL IMPRESSION: Pt reports improvement in pain with standing and able to stand 30-45 min and wash dishes.  She also has improved 6 MW T  03-06-23  1202 ft and 5 x STS 13.0 sec  She is compliant with HEP and Independent and met all STG and LTG# 1 today.. Pain level reduced to 3-4/10, before 6 MWT and increased to 8/10   Pt consented to TPDN and was closely monitored throughout session with no adverse effects.   Added hip abduction to HEP.  Will continue to progrees strengtheniing.  Session also concentrated on proper lifting and body mechanics. Pt would benefit from aquatics and skilled PT to address impairments that are affecting daily life, ability to negotiate step and care for young  children in her home.   EVAL-Patient is a 47 y.o. female who was seen today for physical therapy evaluation and treatment for chronic RT side back pain with radiculopathy into buttocks. Pt also has RT hip pain that radiates into groin and is hoping for eventual THA once she loses 40 pounds. Pt would benefit from aquatics and skilled PT to address impairments that are affecting daily life, ability to negotiate step and care for young children in her home.     OBJECTIVE IMPAIRMENTS: difficulty walking, decreased ROM, decreased strength, hypomobility, impaired flexibility, improper body mechanics, postural dysfunction, obesity, and pain.    ACTIVITY LIMITATIONS: carrying, lifting, bending, standing, squatting, sleeping, stairs, transfers, locomotion level, and caring for others   PARTICIPATION LIMITATIONS: meal prep, cleaning, laundry, shopping, community activity, and caring for others   PERSONAL FACTORS: Asthma, MRSA +, obesity, Grave's dz, hydrdenitis are also affecting patient's functional outcome.    REHAB POTENTIAL: Good   CLINICAL DECISION MAKING: Evolving/moderate complexity   EVALUATION COMPLEXITY: Moderate     GOALS: Goals reviewed with patient? Yes   SHORT TERM GOALS: Target date: 03-06-23   Pt will be independent with initial HEP Baseline:no knowledge 03/04/23: I and compliant  Goal status: MET    2.  Pt with 7/10 pain at rest. Baseline: 7/10  03/04/23: 5/10 Goal status: MET    3.  Demonstrate understanding of neutral posture and be more conscious of position and posture throughout the day.  Baseline: no knowledge Goal status:MET       LONG TERM GOALS: Target date: 04-03-23   Pt will be able to stand for 30 minutes to care for infant grandchildren  Baseline: unable to stand for 5 min 03-06-23 30 to 45 min now able to wash dishes now Goal status:MET   2.  Pt will be able to perform household chores with pain level 3/10 or less Baseline: unable to stand for 5 min, unable to do chores 03-06-23  Pt able to do dishes now and stand for 30 min  3-4/10 Goal status: ONGOING   3.  Pt will be able to sleep for 5 or more hours consecutive sleep Baseline: waking every 2 hours 03-06-23 3 to 3 and 1/2 now hours Goal status: ONGOING   4.  Pt will be able to get on ground to play and care for grandchildren Baseline: unable to transfer and rise from ground Goal status: ONGOING   5.  FOTO will improve from  34%  to   56%   indicating improved functional mobility.  Baseline: eval 34 % Goal status: INITIAL   6.  Demonstrate 4/5 RT LE strength to improve stability, safety and endurance of community level function Baseline: Pt with demonstrated weakness with 30 sec 5 x STS Goal status: ONGOING   7. Pt will be able to negotiate steps with pain 3/10 or less            Baseline: Pt unable to negotiate steps without 10/10 pain            Goal Status,ONGOING   PLAN:   PT FREQUENCY: 1-2x/week   PT DURATION: 8 weeks   PLANNED INTERVENTIONS: Therapeutic exercises, Therapeutic activity, Neuromuscular re-education, Balance training, Gait training, Patient/Family education, Self Care, Joint mobilization, Stair training, Aquatic Therapy, Dry Needling, Electrical stimulation, Spinal mobilization, Cryotherapy, Moist heat, Taping, Ionotophoresis 4mg /ml Dexamethasone, Manual therapy, and Re-evaluation.   PLAN FOR NEXT SESSION: Assess TPDN and review and progress exercise  Get up from the ground and standing  exercises Check foto score    Garen Lah, PT, [facility] Certified Exercise Expert for the Aging Adult  03/06/23 11:06 AM Phone: [phone] Fax: [phone]

## 2023-03-06 ENCOUNTER — Encounter: Payer: Self-pay | Admitting: Physical Medicine and Rehabilitation

## 2023-03-06 ENCOUNTER — Telehealth: Payer: Self-pay | Admitting: *Deleted

## 2023-03-06 ENCOUNTER — Encounter: Payer: Self-pay | Admitting: Physical Therapy

## 2023-03-06 ENCOUNTER — Ambulatory Visit: Payer: 59 | Admitting: Physical Therapy

## 2023-03-06 ENCOUNTER — Other Ambulatory Visit: Payer: Self-pay

## 2023-03-06 DIAGNOSIS — M6281 Muscle weakness (generalized): Secondary | ICD-10-CM

## 2023-03-06 DIAGNOSIS — R262 Difficulty in walking, not elsewhere classified: Secondary | ICD-10-CM

## 2023-03-06 DIAGNOSIS — G8929 Other chronic pain: Secondary | ICD-10-CM

## 2023-03-06 DIAGNOSIS — M25551 Pain in right hip: Secondary | ICD-10-CM

## 2023-03-06 DIAGNOSIS — M545 Low back pain, unspecified: Secondary | ICD-10-CM | POA: Diagnosis not present

## 2023-03-06 DIAGNOSIS — R293 Abnormal posture: Secondary | ICD-10-CM

## 2023-03-06 MED ORDER — OXYCODONE-ACETAMINOPHEN 5-325 MG PO TABS: 0.5000 | ORAL_TABLET | Freq: Two times a day (BID) | ORAL | 0 refills | Status: DC | PRN

## 2023-03-06 NOTE — Telephone Encounter (Signed)
Urine drug screen for this encounter is consistent for prescribed medication and no controlled medication present.

## 2023-03-06 NOTE — Therapy (Signed)
OUTPATIENT PHYSICAL THERAPY TREATMENT NOTE PHYSICAL THERAPY DISCHARGE SUMMARY  Visits from Start of Care: 6  Current functional level related to goals / functional outcomes: As indicated below   Remaining deficits: 0/10 pain in back  10/10 R hip pain /going to MD for further evaluation for possible THA   Education / Equipment: HEP    Patient agrees to discharge. Patient goals were met. Patient is being discharged due to meeting the stated rehab goals.    Patient Name: [name] MRN: [id number] DOB:1976/06/13, 47 y.o., female Today's Date: 03/11/2023  PCP: System, Provider Not In  REFERRING PROVIDER: Rodolph Bong, MD   END OF SESSION:   PT End of Session - 03/11/23 1023     Visit Number 6    Number of Visits 16    Date for PT Re-Evaluation 04/03/23    Authorization Type [employer] MCR FOTO 6th and 10th visit    PT Start Time 1021    PT Stop Time 1100    PT Time Calculation (min) 39 min                Past Medical History:  Diagnosis Date   Asthma    Hydradenitis    MRSA (methicillin resistant staph aureus) culture positive    Obesity    Past Surgical History:  Procedure Laterality Date   CESAREAN SECTION     x2   TUBAL LIGATION     WISDOM TOOTH EXTRACTION  2011   Patient Active Problem List   Diagnosis Date Noted   Left lumbar radiculopathy 03/03/2023   Morbid obesity 03/03/2023   Chronic pain syndrome 03/03/2023   Hip arthritis 02/25/2023   Irregular periods 01/29/2023   Encounter for fertility planning 01/29/2023   Primary osteoarthritis of right hip 01/21/2023   Graves disease 07/06/2019   Hyperthyroidism 07/06/2019   Current smoker 05/26/2019   Seasonal and perennial allergic rhinitis 03/25/2018   Anaphylactic shock due to adverse food reaction 08/19/2017   Perennial and seasonal allergic rhinitis 08/19/2017   Moderate persistent asthma without complication 08/19/2017    REFERRING DIAG: M54.41,G89.29 (ICD-10-CM) - Chronic right-sided  low back pain with right-sided sciatica   THERAPY DIAG:  Chronic right-sided low back pain, unspecified whether sciatica present  Pain in right hip  Muscle weakness (generalized)  Abnormal posture  Difficulty in walking, not elsewhere classified  Rationale for Evaluation and Treatment Rehabilitation  PERTINENT HISTORY: Asthma, MRSA +, obesity, Grave's dz, hydrdenitis   PRECAUTIONS: shellfish allergy  SUBJECTIVE:  SUBJECTIVE STATEMENT:  My back is always better after exercise and I am sleeping so much better but and my hip is a 10/10. I can't wait to have surgery    EVAL -My doctor sent me here for my back, I have problems with my RT hip but I can't have surgery until I lose 40 pounds.  I have been having back problems for a long time 3 years but has worsened in the last 3 months.  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 I cannot leave [name] alone, she must be with me at all times.   PAIN:  Are you having pain? Yes: NPRS scale: 5/10  Pain location: lower back into RT  buttock and stays Pain description: sharp pain in lower middle in the center and it radiating into my RT hip Aggravating factors: If I stand more than 10 minutes, going up steps my back is burning. Relieving factors: nothing Holding babies hard, no more than 5 minutes standing, I cannot sleep on back at night   OBJECTIVE: (objective measures completed at initial evaluation unless otherwise dated)   DIAGNOSTIC FINDINGS:  FINDINGS: Please note there are 4 non-rib-bearing lumbar vertebra. The lower most lumbar vertebra will be labeled L5. Insert straightening of normal lordosis. There is trace  anterolisthesis of L4 on L5 that is likely facet mediated. Moderate disc space narrowing and spurring at L5-S1, mild disc space narrowing and spurring at L4-L5. Moderate L4-L5 and L5-S1 facet hypertrophy. Normal vertebral body heights. No evidence of fracture or focal bone abnormalities. Degenerative change of both sacroiliac joints. Soft tissue attenuation from habitus limits detailed assessment.   IMPRESSION: 1. 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 2. Trace anterolisthesis of L4 on L5, likely facet mediated. 3. Please note there are 4 non-rib-bearing lumbar vertebra, the lower most lumbar vertebra was labeled L5.     Electronically Signed   By: Narda Rutherford M.D.   On: 01/22/2023 12:45   FINDINGS: There is acetabular over coverage. Osseous bump at the femoral head neck junction. Moderate right hip joint space narrowing and spurring. Acetabular sclerosis may be degenerative. No evidence of fracture, erosion or avascular necrosis. Left hip joint space narrowing and spurring to a lesser extent. Mild degenerative change of the sacroiliac joints and pubic symphysis.   IMPRESSION: 1. 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 2. Right hip acetabular over coverage may contribute to impingement.     Electronically Signed   By: Narda Rutherford M.D.   On: 01/22/2023 12:43   PATIENT SURVEYS:  FOTO 34% predicted  56%     SCREENING FOR RED FLAGS: Bowel or bladder incontinence: No Cauda equina syndrome: No     COGNITION: Overall cognitive status: Within functional limits for tasks assessed                          SENSATION: WFL   MUSCLE LENGTH: Hamstrings: Right 60 deg; Left 60 deg Thomas test: Right -30 deg; Left 22 deg   POSTURE: rounded shoulders, forward head, and anterior pelvic tilt  pelvic level higher my 2 finger widths on RT Obesity, and excess adipose tissue  /pannus PALPATION: TTP over RT QL    LUMBAR ROM:    AROM eval  03-11-23  Flexion Fingertips to midshin no pain Fingertips to toes no pain  Extension 30 with pinching pain on the RT Finger tip to below jt line no pain  Right lateral flexion Pain with concordant RT back pain Finger to knee jt line no pain  Left lateral flexion Finger to knee jt line Finger to knee jt line not pain  Right rotation 50% pain 80%  Left rotation 50% 80%   (Blank rows = not tested)   LOWER EXTREMITY ROM:      Active  Right eval Left eval RT  Hip flexion 60 60   Hip extension       Hip abduction       Hip adduction       Hip internal rotation 18 p! 21 20p!20p!  Hip external rotation 19p! 25   Knee flexion       Knee extension       Ankle dorsiflexion       Ankle plantarflexion       Ankle inversion       Ankle eversion        (Blank rows = not tested)   LOWER EXTREMITY MMT:   Pain with Rt hip motion   MMT Right eval Left eval RT/LT 03-11-23  Hip flexion 3- 3- 4/4+  Hip extension 3- 4- 4-/4+  Hip abduction 3- 3- 4-4+  Hip adduction       Hip internal rotation       Hip external rotation       Knee flexion 4 4 5/5  Knee extension 4 4 5/5  Ankle dorsiflexion       Ankle plantarflexion       Ankle inversion       Ankle eversion        (Blank rows = not tested)   LUMBAR SPECIAL TESTS:  Straight leg raise test: Negative and Slump test: Negative   FUNCTIONAL TESTS:  5 times sit to stand: 30.4 sec 6 minute walk test: 02/25/23: 1015 feet  6 MW T  03-06-23  1202 ft 5 x STS 13.0 sec  03-11-23  9.08 sec    GAIT: Distance walked: 150 ft Assistive device utilized: None Level of assistance: Complete Independence Comments: antalgic gait on RT   TODAY'S TREATMENT: [facility] Adult PT Treatment:                                                DATE: 03-11-23 Therapeutic Exercise: SKTC 10 sec x 3  LTR 20 sec x 5  PPT 10 reps x 2  Bridge  x 10 Hip abduction 10 x 1 each Hip ext 10 x 1 each STS with  25 # 2 x 10 Lunge revers Bird dog Dead bug Review HEP  Therapeutic Activity: Pt educated and demo floor to stand transfers x 3 with VC and TC for cues.  Pt able to do final transfer independently with safe technique  [facility] Adult PT Treatment:                                                DATE: 03-06-23   ft (1635-1980 ft) Therapeutic Exercise: Therapeutic Exercise: SKTC 10 sec x 3  LTR 20  sec x 5  PPT 10 reps x 2  Bridge  x 10 Hip abduction 10 x 1 each Hip ext 10 x 1 each Heel raises x15 STS with 25 # 2 x 10 SELF CARE   posture and body mechanics with handout   Demo proper lift and deadlift of 25 #   ADL's Manual Therapy:  STW-  LT QL and gluteal soft tissue  Myofascial release of LT QL Trigger Point Dry Needling Treatment: Pre-treatment instruction: Patient instructed on dry needling rationale, procedures, and possible side effects including pain during treatment (achy,cramping feeling), bruising, drop of blood, lightheadedness, nausea, sweating. Patient Consent Given: Yes Education handout provided: Yes Muscles treated: LT  QL  and  gluteal Needle size and number:  105 x .40mm Electrical stimulation performed: No Parameters: N/A Treatment response/outcome: Twitch response elicited and Palpable decrease in muscle tension Post-treatment instructions: Patient instructed to expect possible mild to moderate muscle soreness later today and/or tomorrow. Patient instructed in methods to reduce muscle soreness and to continue prescribed HEP. Patient was also educated on signs and symptoms of infection and to seek medical attention should they occur. Patient verbalized understanding of these instructions and education.    [facility] Adult PT Treatment:                                                DATE: 03/04/23 Therapeutic Exercise: Nustep L5 UE/LE x 5 minutes Heel raises x15 Gastroc stretch  2 x 30 sec  each Hip abduction 10 x 1 each Hip ext 10 x 1 each Alt high march x 10 each   SKTC 10 sec x 3  LTR 20 sec x 5  PPT 10 reps x 2  Bridge  x 10 STS AMRAP  x 30 Seated scap squeeze Seated pelvic tilits Hamstring stretches seated     TREATMENT 02/27/23: See note for aquatics treatment   Pt cancelled 02/27/23   Asterisk Signs Eval 02-06-23 02-25-23  03-06-23         5 x  STS 30.4 sec   13.0         Max pain 10/10 verbalized 11/10   8/10            1015 ft  1202 ft                                         PATIENT EDUCATION:  Education details: POC, Informed consent,TPDN, FOTO review and issue HEP Person educated: Patient and pt is gaurdian of mother with two children present she must oversee legally Education method: Explanation, Demonstration, Tactile cues, Verbal cues, and Handouts Education comprehension: verbalized understanding, returned demonstration, verbal cues required, tactile cues required, and needs further education   HOME EXERCISE PROGRAM: Access Code: [email or web address] URL: [email or web address] Date: 03/11/2023 Prepared by: Garen Lah  Program Notes Toe yoga  Exercises - Supine Posterior Pelvic Tilt  - 1 x daily - 7 x weekly - 3 sets - 10 reps - Supine Single Knee to Chest Stretch  - 2 x daily - 7 x weekly - 1 sets - 5 reps - 10 hold - Supine Lower Trunk Rotation  - 2 x daily - 7 x weekly - 1 sets - 5 reps - 20 hold - Supine  Bridge  - 1 x daily - 7 x weekly - 3 sets - 10 reps - Sit to stand with sink support Movement snack  - 1 x daily - 7 x weekly - 3 sets - 10 reps - Seated Hamstring Stretch  - 1 x daily - 7 x weekly - 1 sets - 2 reps - 30 sec hold - Standing Heel Raise with Support  - 1 x daily - 7 x weekly - 31 sets - 25 reps - Standing Gastroc Stretch  - 1 x daily - 7 x weekly - 1 sets - 4 reps - 30 sec hold - Sidelying Hip Abduction  - 1 x daily - 7 x weekly - 3 sets - 10 reps - Side Stepping with Resistance at Feet  - 1 x daily - 7 x weekly - 3 sets - 10 reps - Reverse lunge holding onto counter  - 1 x daily - 7 x  weekly - 3 sets - 10 reps - Supine Dead Bug with Leg Extension  - 1 x daily - 7 x weekly - 3 sets - 10 reps - Bird Dog  - 1 x daily - 7 x weekly - 3 sets - 10 reps  ASSESSMENT:   CLINICAL IMPRESSION: Pt reports improvement in  back pain 0/10  with standing and able to stand 30-45 min and wash dishes but pt is still plagued with R hip pain she reports up to 10/10.Marland Kitchen.Pt feels like she can do her exercise on her own and her major limitation in her R hip pain.  She is going to see MD to possibly schedule Total hip replacement  She also has improved 6 MW T  03-06-23  1202 ft and 5 x STS 9.08 sec on 03-11-23.0 sec  She is compliant with HEP and Independent and met all STG and LTG's today. Pt is independent with her HEP today and would like to DC and pursue a total hip replacement since her back pain is 0/10. FOTO improved to 46% and partially meeting goal.  Pt states she will continue her HEP and will also [facility] to meet her weight loss goals in preparation for THA surgery.  EVAL-Patient is a 47 y.o. female who was seen today for physical therapy evaluation and treatment for chronic RT side back pain with radiculopathy into buttocks. Pt also has RT hip pain that radiates into groin and is hoping for eventual THA once she loses 40 pounds. Pt would benefit from aquatics and skilled PT to address impairments that are affecting daily life, ability to negotiate step and care for young children in her home.    OBJECTIVE IMPAIRMENTS: difficulty walking, decreased ROM, decreased strength, hypomobility, impaired flexibility, improper body mechanics, postural dysfunction, obesity, and pain.    ACTIVITY LIMITATIONS: carrying, lifting, bending, standing, squatting, sleeping, stairs, transfers, locomotion level, and caring for others   PARTICIPATION LIMITATIONS: meal prep, cleaning, laundry, shopping, community activity, and caring for others   PERSONAL FACTORS:  Asthma, MRSA +, obesity, Grave's dz, hydrdenitis are also affecting patient's functional outcome.    REHAB POTENTIAL: Good   CLINICAL DECISION MAKING: Evolving/moderate complexity   EVALUATION COMPLEXITY: Moderate     GOALS: Goals reviewed with patient? Yes   SHORT TERM GOALS: Target date: 03-06-23   Pt will be independent with initial HEP Baseline:no knowledge 03/04/23: I and compliant  Goal status: MET    2.  Pt with 7/10 pain at  rest. Baseline: 7/10  03/04/23: 5/10 Goal status: MET    3.  Demonstrate understanding of neutral posture and be more conscious of position and posture throughout the day.  Baseline: no knowledge Goal status:MET       LONG TERM GOALS: Target date: 04-03-23   Pt will be able to stand for 30 minutes to care for infant grandchildren  Baseline: unable to stand for 5 min 03-06-23 30 to 45 min now able to wash dishes now Goal status:MET   2.  Pt will be able to perform household chores with pain level 3/10 or less Baseline: unable to stand for 5 min, unable to do chores 03-06-23  Pt able to do dishes now and stand for 30 min  3-4/10 03-11-23 for an hour with no pain Goal status: MET   3.  Pt will be able to sleep for 5 or more hours consecutive sleep Baseline: waking every 2 hours 03-06-23 3 to 3 and 1/2 now hours 03-11-23  able to sleep most of night 6 hours with no back pain. Goal status: MET   4.  Pt will be able to get on ground to play and care for grandchildren Baseline: unable to transfer and rise from ground Goal status: MET   5.  FOTO will improve from  34%  to   56%  indicating improved functional mobility.  Baseline: eval 34 % 03-11-23  46% Goal status: Partially met 6.  Demonstrate 4/5 RT LE strength to improve stability, safety and endurance of community level function Baseline: Pt with demonstrated weakness with 30 sec 5 x STS 03-11-23 Goal status: MET   7. Pt will be able to negotiate steps with pain 3/10 or less             Baseline: Pt unable to negotiate steps without 10/10 pain  03-11-23 pt goes up and down steps alternating with no pain            Goal Status,MET   PLAN:   PT FREQUENCY: 1-2x/week   PT DURATION: 8 weeks   PLANNED INTERVENTIONS: Therapeutic exercises, Therapeutic activity, Neuromuscular re-education, Balance training, Gait training, Patient/Family education, Self Care, Joint mobilization, Stair training, Aquatic Therapy, Dry Needling, Electrical stimulation, Spinal mobilization, Cryotherapy, Moist heat, Taping, Ionotophoresis 4mg /ml Dexamethasone, Manual therapy, and Re-evaluation.   PLAN FOR NEXT SESSION: Assess TPDN and review and progress exercise  Get up from the ground and standing exercises Check foto score   Garen Lah, PT, ATRIC Certified Exercise Expert for the Aging Adult  03/11/23 1:04 PM Phone: [phone] Fax: [phone]

## 2023-03-06 NOTE — Patient Instructions (Signed)
Sleeping on Back  Place pillow under knees. A pillow with cervical support and a roll around waist are also helpful. Copyright  VHI. All rights reserved.  Sleeping on Side Place pillow between knees. Use cervical support under neck and a roll around waist as needed. Copyright  VHI. All rights reserved.   Sleeping on Stomach   If this is the only desirable sleeping position, place pillow under lower legs, and under stomach or chest as needed.  Posture - Sitting   Sit upright, head facing forward. Try using a roll to support lower back. Keep shoulders relaxed, and avoid rounded back. Keep hips level with knees. Avoid crossing legs for long periods. Stand to Sit / Sit to Stand   To sit: Bend knees to lower self onto front edge of chair, then scoot back on seat. To stand: Reverse sequence by placing one foot forward, and scoot to front of seat. Use rocking motion to stand up.   Work Height and Reach  Ideal work height is no more than 2 to 4 inches below elbow level when standing, and at elbow level when sitting. Reaching should be limited to arm's length, with elbows slightly bent.  Bending  Bend at hips and knees, not back. Keep feet shoulder-width apart.    Posture - Standing   Good posture is important. Avoid slouching and forward head thrust. Maintain curve in low back and align ears over shoul- ders, hips over ankles.  Alternating Positions   Alternate tasks and change positions frequently to reduce fatigue and muscle tension. Take rest breaks. Computer Work   Position work to Programmer, multimedia. Use proper work and seat height. Keep shoulders back and down, wrists straight, and elbows at right angles. Use chair that provides full back support. Add footrest and lumbar roll as needed.  Getting Into / Out of Car  Lower self onto seat, scoot back, then bring in one leg at a time. Reverse sequence to get out.  Dressing  Lie on back to pull socks or slacks over feet, or sit  and bend leg while keeping back straight.    Housework - Sink  Place one foot on ledge of cabinet under sink when standing at sink for prolonged periods.   Pushing / Pulling  Pushing is preferable to pulling. Keep back in proper alignment, and use leg muscles to do the work.  Deep Squat   Squat and lift with both arms held against upper trunk. Tighten stomach muscles without holding breath. Use smooth movements to avoid jerking.  Avoid Twisting   Avoid twisting or bending back. Pivot around using foot movements, and bend at knees if needed when reaching for articles.  Carrying Luggage   Distribute weight evenly on both sides. Use a cart whenever possible. Do not twist trunk. Move body as a unit.   Lifting Principles Maintain proper posture and head alignment. Slide object as close as possible before lifting. Move obstacles out of the way. Test before lifting; ask for help if too heavy. Tighten stomach muscles without holding breath. Use smooth movements; do not jerk. Use legs to do the work, and pivot with feet. Distribute the work load symmetrically and close to the center of trunk. Push instead of pull whenever possible.   Ask For Help   Ask for help and delegate to others when possible. Coordinate your movements when lifting together, and maintain the low back curve.  Log Roll   Lying on back, bend left knee and place left  arm across chest. Roll all in one movement to the right. Reverse to roll to the left. Always move as one unit. Housework - Sweeping  Use long-handled equipment to avoid stooping.   Housework - Wiping  Position yourself as close as possible to reach work surface. Avoid straining your back.  Laundry - Unloading Wash   To unload small items at bottom of washer, lift leg opposite to arm being used to reach.  Gardening - Raking  Move close to area to be raked. Use arm movements to do the work. Keep back straight and avoid twisting.      Cart  When reaching into cart with one arm, lift opposite leg to keep back straight.   Getting Into / Out of Bed  Lower self to lie down on one side by raising legs and lowering head at the same time. Use arms to assist moving without twisting. Bend both knees to roll onto back if desired. To sit up, start from lying on side, and use same move-ments in reverse. Housework - Vacuuming  Hold the vacuum with arm held at side. Step back and forth to move it, keeping head up. Avoid twisting.   Laundry - Armed forces training and education officer so that bending and twisting can be avoided.   Laundry - Unloading Dryer  Squat down to reach into clothes dryer or use a reacher.  Gardening - Weeding / Psychiatric nurse or Kneel. Knee pads may be helpful.                    [name], PT, ATR[employer] Certified Exercise Expert for the Aging Adult  03/06/23 10:40 AM Phone: [phone] Fax: [phone]

## 2023-03-07 ENCOUNTER — Telehealth: Payer: Self-pay | Admitting: Physical Medicine and Rehabilitation

## 2023-03-07 NOTE — Telephone Encounter (Signed)
Patient calling to see if lab work is back. Please return call

## 2023-03-10 ENCOUNTER — Telehealth: Payer: Self-pay

## 2023-03-10 NOTE — Telephone Encounter (Signed)
Patient advised of information per Dr. Stewart. aw °

## 2023-03-10 NOTE — Telephone Encounter (Signed)
Patient called today complaining her menstrual cycle started on 02/28/2023 and she is still currently on it as of this morning. She states the only new medication she is on is the Cosentyx injections. Patient wants to know should she continue?

## 2023-03-11 ENCOUNTER — Ambulatory Visit: Payer: 59 | Admitting: Physical Therapy

## 2023-03-11 ENCOUNTER — Encounter: Payer: Self-pay | Admitting: Physical Therapy

## 2023-03-11 DIAGNOSIS — M545 Low back pain, unspecified: Secondary | ICD-10-CM

## 2023-03-11 DIAGNOSIS — R262 Difficulty in walking, not elsewhere classified: Secondary | ICD-10-CM

## 2023-03-11 DIAGNOSIS — M6281 Muscle weakness (generalized): Secondary | ICD-10-CM

## 2023-03-11 DIAGNOSIS — M25551 Pain in right hip: Secondary | ICD-10-CM

## 2023-03-11 DIAGNOSIS — R293 Abnormal posture: Secondary | ICD-10-CM

## 2023-03-12 ENCOUNTER — Other Ambulatory Visit: Payer: Self-pay

## 2023-03-12 ENCOUNTER — Emergency Department (HOSPITAL_COMMUNITY)
Admission: EM | Admit: 2023-03-12 | Discharge: 2023-03-12 | Disposition: A | Discharge status: Home / Self Care | Payer: 59 | Attending: Emergency Medicine | Admitting: Emergency Medicine

## 2023-03-12 ENCOUNTER — Encounter (HOSPITAL_COMMUNITY): Payer: Self-pay

## 2023-03-12 ENCOUNTER — Emergency Department (HOSPITAL_COMMUNITY): Payer: 59

## 2023-03-12 DIAGNOSIS — N939 Abnormal uterine and vaginal bleeding, unspecified: Secondary | ICD-10-CM | POA: Insufficient documentation

## 2023-03-12 DIAGNOSIS — J45909 Unspecified asthma, uncomplicated: Secondary | ICD-10-CM | POA: Diagnosis not present

## 2023-03-12 LAB — CBC WITH DIFFERENTIAL/PLATELET
Abs Immature Granulocytes: 0.02 10*3/uL (ref 0.00–0.07)
Basophils Absolute: 0 10*3/uL (ref 0.0–0.1)
Basophils Relative: 1 %
Eosinophils Absolute: 0.2 10*3/uL (ref 0.0–0.5)
Eosinophils Relative: 2 %
HCT: 40.1 % (ref 36.0–46.0)
Hemoglobin: 12.4 g/dL (ref 12.0–15.0)
Immature Granulocytes: 0 %
Lymphocytes Relative: 22 %
Lymphs Abs: 1.9 10*3/uL (ref 0.7–4.0)
MCH: 26.1 pg (ref 26.0–34.0)
MCHC: 30.9 g/dL (ref 30.0–36.0)
MCV: 84.4 fL (ref 80.0–100.0)
Monocytes Absolute: 0.6 10*3/uL (ref 0.1–1.0)
Monocytes Relative: 7 %
Neutro Abs: 5.8 10*3/uL (ref 1.7–7.7)
Neutrophils Relative %: 68 %
Platelets: 201 10*3/uL (ref 150–400)
RBC: 4.75 MIL/uL (ref 3.87–5.11)
RDW: 19.6 % — ABNORMAL HIGH (ref 11.5–15.5)
WBC: 8.5 10*3/uL (ref 4.0–10.5)
nRBC: 0 % (ref 0.0–0.2)

## 2023-03-12 LAB — COMPREHENSIVE METABOLIC PANEL
ALT: 17 U/L (ref 0–44)
AST: 21 U/L (ref 15–41)
Albumin: 3.2 g/dL — ABNORMAL LOW (ref 3.5–5.0)
Alkaline Phosphatase: 80 U/L (ref 38–126)
Anion gap: 7 (ref 5–15)
BUN: 12 mg/dL (ref 6–20)
CO2: 25 mmol/L (ref 22–32)
Calcium: 9 mg/dL (ref 8.9–10.3)
Chloride: 104 mmol/L (ref 98–111)
Creatinine, Ser: 0.69 mg/dL (ref 0.44–1.00)
GFR, Estimated: >60 mL/min (ref >60)
Glucose, Bld: 106 mg/dL — ABNORMAL HIGH (ref 70–99)
Potassium: 4.1 mmol/L (ref 3.5–5.1)
Sodium: 136 mmol/L (ref 135–145)
Total Bilirubin: <0.1 mg/dL — ABNORMAL LOW (ref 0.3–1.2)
Total Protein: 8.1 g/dL (ref 6.5–8.1)

## 2023-03-12 LAB — PREGNANCY, URINE: Preg Test, Ur: NEGATIVE

## 2023-03-12 NOTE — ED Provider Triage Note (Addendum)
Emergency Medicine Provider Triage Evaluation Note  [name] , a 47 y.o. female  was evaluated in triage.  Pt complains of vaginal bleeding.  Patient states that she has had persistent vaginal bleeding daily since the fourth of this month.  States she is typically regular heavy menstrual cycles monthly for a few days at a time.  Was seen by OB/GYN earlier in March for similar kind of symptoms.  Denies blood thinner use, no abdominal/pelvic/vaginal pain.  Denies fever, chills, urinary symptoms, vaginal discharge, change in bowel habits.  States that prior to this past March visit, has not seen OB/GYN for 20+ years.  Denies lightheadedness, dizziness, near syncope, shortness of breath.  Review of Systems  Positive: See above Negative:   Physical Exam  BP 133/71   Pulse 77   Temp 98.2 F (36.8 C)   Resp 18   SpO2 97%  Gen:   Awake, no distress   Resp:  Normal effort  MSK:   Moves extremities without difficulty  Other:    Medical Decision Making  Medically screening exam initiated at 10:04 AM.  Appropriate orders placed.  [name] was informed that the remainder of the evaluation will be completed by another provider, this initial triage assessment does not replace that evaluation, and the importance of remaining in the ED until their evaluation is complete.      Peter Garter, [place] 03/12/23 1006

## 2023-03-12 NOTE — ED Triage Notes (Signed)
Pt came in via POV d/t vaginal bleeding since the 5th of this month. States she does have periods & this did not start when her period was expected. States it has been dark & bright red blood. A/Ox4, denies pain or light headed/dizzy.

## 2023-03-12 NOTE — Discharge Instructions (Signed)
Note the workup today was overall reassuring.  No evidence of anemia from blood loss.  Ultrasound did show signs of fibroid which is most likely cause of persistent bleeding.  Recommend follow-up with OB/GYN for reassessment/possible intervention given heavy menstrual cycle.  Please do not hesitate to return to emergency department for worrisome signs and symptoms we discussed become apparent.

## 2023-03-12 NOTE — ED Provider Notes (Signed)
[place] [facility] AT [facility] Provider Note   CSN: [id number] Arrival date & time: 03/12/23  4098     History  Chief Complaint  Patient presents with   Vaginal Bleeding    [name] is a 47 y.o. female.   Vaginal Bleeding   47 year old female presents emergency department with complaints of vaginal bleeding.  Patient states that she has had persistent vaginal bleeding daily since the fourth of this month.  States she is typically regular heavy menstrual cycles monthly for a few days at a time.  Was seen by OB/GYN earlier in March for similar kind of symptoms.  Denies blood thinner use, no abdominal/pelvic/vaginal pain.  Denies fever, chills, urinary symptoms, vaginal discharge, change in bowel habits.  States that prior to this past March visit, has not seen OB/GYN for 20+ years.  Denies lightheadedness, dizziness, near syncope, shortness of breath.   Past medical history significant for hidradenitis, asthma, obesity, Graves' disease  Home Medications Prior to Admission medications   Medication Sig Start Date End Date Taking? Authorizing Provider  albuterol [facility] HFA) 108 (90 Base) MCG/ACT inhaler Inhale 4 puffs every 4-6 hours as needed 05/01/21   Alfonse Spruce, MD  albuterol (PROVENTIL) (2.5 MG/3ML) 0.083% nebulizer solution Take 3 mLs (2.5 mg total) by nebulization every 4 (four) hours as needed for wheezing or shortness of breath. 02/13/23   Alfonse Spruce, MD  albuterol (VENTOLIN HFA) 108 (90 Base) MCG/ACT inhaler Inhale 2 puffs by mouth every 4 hours as needed for wheezing or shortness of breath 01/24/23   Nehemiah Settle, FNP  amitriptyline (ELAVIL) 25 MG tablet Take 25 mg by mouth at bedtime. 05/22/22   [provider]  Azelastine HCl 137 MCG/SPRAY SOLN Use 2 sprays into each nostril twice daily 01/29/23   Ferol Luz, MD  clindamycin (CLEOCIN T) 1 % external solution Apply to open sores QD after shower. 01/08/23    Willeen Niece, MD  cyclobenzaprine (FLEXERIL) 10 MG tablet Take 10 mg by mouth at bedtime.    [provider]  dorzolamide-timolol (COSOPT) 2-0.5 % ophthalmic solution 1 drop 2 (two) times daily.    [provider]  EPINEPHRINE 0.3 mg/0.3 mL IJ SOAJ injection Inject 0.61ml subcutaneously as needed for anaphylaxis 02/10/23   Nehemiah Settle, FNP  ergocalciferol (VITAMIN D2) 1.25 MG (50000 UT) capsule Take 50,000 Units by mouth once a week.    [provider]  esomeprazole (NEXIUM) 40 MG capsule Take 1 capsule by mouth twice daily 11/20/22   Alfonse Spruce, MD  ferrous sulfate 325 (65 FE) MG EC tablet Take 325 mg by mouth 3 (three) times daily with meals.    [provider]  fluconazole (DIFLUCAN) 150 MG tablet Take 1 tablet (150 mg total) by mouth daily. 02/05/23   Willeen Niece, MD  ibuprofen (ADVIL) 800 MG tablet Take 1 tablet (800 mg total) by mouth every 8 (eight) hours as needed. 01/03/23   Brock Bad, MD  levocetirizine Elita Boone) 5 MG tablet Take 1 tablet by mouth every day as needed for allergies 11/20/22   Alfonse Spruce, MD  minocycline (MINOCIN) 100 MG capsule Take 1-2 capsules by mouth daily. 02/05/23   Willeen Niece, MD  montelukast (SINGULAIR) 10 MG tablet TAKE 1 TABLET BY MOUTH AT  BEDTIME 01/22/23   Nehemiah Settle, FNP  oxyCODONE-acetaminophen (PERCOCET/ROXICET) 5-325 MG tablet Take 0.5-1 tablets by mouth 2 (two) times daily as needed for severe pain. 03/06/23   Elijah Birk  C, DO  Secukinumab (COSENTYX UNOREADY) 300 MG/2ML SOAJ Inject 1 Pen into the skin every 28 (twenty-eight) days. 01/08/23   Willeen Niece, MD  Secukinumab (COSENTYX UNOREADY) 300 MG/2ML SOAJ Inject 1 Pen into the skin every 7 (seven) days. Inject 1 pen SQ QW x 5 weeks. 01/08/23   Willeen Niece, MD  Secukinumab (COSENTYX UNOREADY) 300 MG/2ML SOAJ Inject 1 Pen into the skin once a week. On week 0, 1, 2, 3 and 4 for loading dose 01/21/23   Willeen Niece, MD   Spacer/Aero-Holding Chambers (AEROCHAMBER PLUS) inhaler Use as instructed 08/05/17   Domenick Gong, MD  tiZANidine (ZANAFLEX) 4 MG tablet TAKE 1 TABLET BY MOUTH AT BEDTIME. 02/12/23   Rodolph Bong, MD  TRELEGY ELLIPTA 200-62.5-25 MCG/ACT AEPB Inhale 1 puff by mouth every day 02/03/23   Nehemiah Settle, FNP      Allergies    Shellfish allergy    Review of Systems   Review of Systems  Genitourinary:  Positive for vaginal bleeding.  All other systems reviewed and are negative.   Physical Exam Updated Vital Signs BP 133/71   Pulse 77   Temp 98.2 F (36.8 C)   Resp 18   SpO2 97%  Physical Exam Vitals and nursing note reviewed.  Constitutional:      General: She is not in acute distress.    Appearance: She is well-developed.  HENT:     Head: Normocephalic and atraumatic.  Eyes:     Conjunctiva/sclera: Conjunctivae normal.  Cardiovascular:     Rate and Rhythm: Normal rate and regular rhythm.     Heart sounds: No murmur heard. Pulmonary:     Effort: Pulmonary effort is normal. No respiratory distress.     Breath sounds: Normal breath sounds. No wheezing, rhonchi or rales.  Abdominal:     Palpations: Abdomen is soft.     Tenderness: There is no abdominal tenderness. There is no right CVA tenderness, left CVA tenderness or guarding.  Genitourinary:    Comments: Patient declined GU exam. Musculoskeletal:        General: No swelling.     Cervical back: Neck supple.     Right lower leg: No edema.     Left lower leg: No edema.  Skin:    General: Skin is warm and dry.     Capillary Refill: Capillary refill takes less than 2 seconds.  Neurological:     Mental Status: She is alert.  Psychiatric:        Mood and Affect: Mood normal.     ED Results / Procedures / Treatments   Labs (all labs ordered are listed, but only abnormal results are displayed) Labs Reviewed  COMPREHENSIVE METABOLIC PANEL - Abnormal; Notable for the following components:      Result Value    Glucose, Bld 106 (*)    Albumin 3.2 (*)    Total Bilirubin <0.1 (*)    All other components within normal limits  CBC WITH DIFFERENTIAL/PLATELET - Abnormal; Notable for the following components:   RDW 19.6 (*)    All other components within normal limits  PREGNANCY, URINE    EKG None  Radiology US Pelvis Complete  Result Date: 03/12/2023 CLINICAL DATA:  Bleeding. EXAM: TRANSABDOMINAL AND TRANSVAGINAL ULTRASOUND OF PELVIS DOPPLER ULTRASOUND OF OVARIES TECHNIQUE: Both transabdominal and transvaginal ultrasound examinations of the pelvis were performed. Transabdominal technique was performed for global imaging of the pelvis including uterus, ovaries, adnexal regions, and pelvic cul-de-sac. It was necessary to proceed with  endovaginal exam following the transabdominal exam to visualize the uterus, endometrium, and ovaries. Color and duplex Doppler ultrasound was utilized to evaluate blood flow to the ovaries. COMPARISON:  Pelvic ultrasound 02/02/2010 FINDINGS: Uterus Measurements: 9.7 cm x 5.9 cm x 5.2 cm = volume: 154 mL. There is solid hypoechoic lesion in the anterior uterine wall measuring 2.0 cm x 1.3 cm x 1.9 cm likely reflecting a fibroid. Endometrium Thickness: 6 mm.  No focal abnormality visualized. Right ovary Measurements: 4.5 cm x 3.1 cm x 3.1 cm = volume: 22 mL. There is 3.2 cm x 2.4 cm x 2.6 cm simple appearing hypoechoic structure in the ovary measuring up to 3.2 cm x 2.4 cm x 2.6 cm. Left ovary Measurements: 3.8 cm x 2.1 cm x 2.7 cm = volume: 11 mL. There is a simple appearing cyst in the left ovary measuring 2.9 cm x 1.8 cm which requires no specific imaging follow-up. Pulsed Doppler evaluation of both ovaries demonstrates normal low-resistance arterial and venous waveforms. Other findings No abnormal free fluid. IMPRESSION: 1. No acute finding in the pelvis or abnormal free fluid. Normal appearance of the endometrium. 2. 2.0 cm solid lesion in the uterine wall likely reflecting a  fibroid. 3. Simple appearing bilateral adnexal cysts measuring up to 3.2 cm on the right requiring no specific imaging follow-up. Electronically Signed   By: Lesia Hausen M.D.   On: 03/12/2023 11:29   US Transvaginal Non-OB  Result Date: 03/12/2023 CLINICAL DATA:  Bleeding. EXAM: TRANSABDOMINAL AND TRANSVAGINAL ULTRASOUND OF PELVIS DOPPLER ULTRASOUND OF OVARIES TECHNIQUE: Both transabdominal and transvaginal ultrasound examinations of the pelvis were performed. Transabdominal technique was performed for global imaging of the pelvis including uterus, ovaries, adnexal regions, and pelvic cul-de-sac. It was necessary to proceed with endovaginal exam following the transabdominal exam to visualize the uterus, endometrium, and ovaries. Color and duplex Doppler ultrasound was utilized to evaluate blood flow to the ovaries. COMPARISON:  Pelvic ultrasound 02/02/2010 FINDINGS: Uterus Measurements: 9.7 cm x 5.9 cm x 5.2 cm = volume: 154 mL. There is solid hypoechoic lesion in the anterior uterine wall measuring 2.0 cm x 1.3 cm x 1.9 cm likely reflecting a fibroid. Endometrium Thickness: 6 mm.  No focal abnormality visualized. Right ovary Measurements: 4.5 cm x 3.1 cm x 3.1 cm = volume: 22 mL. There is 3.2 cm x 2.4 cm x 2.6 cm simple appearing hypoechoic structure in the ovary measuring up to 3.2 cm x 2.4 cm x 2.6 cm. Left ovary Measurements: 3.8 cm x 2.1 cm x 2.7 cm = volume: 11 mL. There is a simple appearing cyst in the left ovary measuring 2.9 cm x 1.8 cm which requires no specific imaging follow-up. Pulsed Doppler evaluation of both ovaries demonstrates normal low-resistance arterial and venous waveforms. Other findings No abnormal free fluid. IMPRESSION: 1. No acute finding in the pelvis or abnormal free fluid. Normal appearance of the endometrium. 2. 2.0 cm solid lesion in the uterine wall likely reflecting a fibroid. 3. Simple appearing bilateral adnexal cysts measuring up to 3.2 cm on the right requiring no  specific imaging follow-up. Electronically Signed   By: Lesia Hausen M.D.   On: 03/12/2023 11:29   [place] Art/Ven Flow Abd Pelv Doppler  Result Date: 03/12/2023 CLINICAL DATA:  Bleeding. EXAM: TRANSABDOMINAL AND TRANSVAGINAL ULTRASOUND OF PELVIS DOPPLER ULTRASOUND OF OVARIES TECHNIQUE: Both transabdominal and transvaginal ultrasound examinations of the pelvis were performed. Transabdominal technique was performed for global imaging of the pelvis including uterus, ovaries, adnexal regions, and pelvic  cul-de-sac. It was necessary to proceed with endovaginal exam following the transabdominal exam to visualize the uterus, endometrium, and ovaries. Color and duplex Doppler ultrasound was utilized to evaluate blood flow to the ovaries. COMPARISON:  Pelvic ultrasound 02/02/2010 FINDINGS: Uterus Measurements: 9.7 cm x 5.9 cm x 5.2 cm = volume: 154 mL. There is solid hypoechoic lesion in the anterior uterine wall measuring 2.0 cm x 1.3 cm x 1.9 cm likely reflecting a fibroid. Endometrium Thickness: 6 mm.  No focal abnormality visualized. Right ovary Measurements: 4.5 cm x 3.1 cm x 3.1 cm = volume: 22 mL. There is 3.2 cm x 2.4 cm x 2.6 cm simple appearing hypoechoic structure in the ovary measuring up to 3.2 cm x 2.4 cm x 2.6 cm. Left ovary Measurements: 3.8 cm x 2.1 cm x 2.7 cm = volume: 11 mL. There is a simple appearing cyst in the left ovary measuring 2.9 cm x 1.8 cm which requires no specific imaging follow-up. Pulsed Doppler evaluation of both ovaries demonstrates normal low-resistance arterial and venous waveforms. Other findings No abnormal free fluid. IMPRESSION: 1. No acute finding in the pelvis or abnormal free fluid. Normal appearance of the endometrium. 2. 2.0 cm solid lesion in the uterine wall likely reflecting a fibroid. 3. Simple appearing bilateral adnexal cysts measuring up to 3.2 cm on the right requiring no specific imaging follow-up. Electronically Signed   By: Lesia Hausen M.D.   On: 03/12/2023 11:29     Procedures Procedures    Medications Ordered in ED Medications - No data to display  ED Course/ Medical Decision Making/ A&P                             Medical Decision Making Amount and/or Complexity of Data Reviewed Labs: ordered. Radiology: ordered.   This patient presents to the ED for concern of vaginal bleeding, this involves an extensive number of treatment options, and is a complaint that carries with it a high risk of complications and morbidity.  The differential diagnosis includes malignancy, ectopic pregnancy, intrauterine pregnancy, leiomyoma, myeloma, coagulopathy, ovarian dysfunction, polyp, malignancy/hyperplasia   Co morbidities that complicate the patient evaluation  See HPI   Additional history obtained:  Additional history obtained from EMR External records from outside source obtained and reviewed including hospital records   Lab Tests:  I Ordered, and personally interpreted labs.  The pertinent results include: No leukocytosis.  No evidence of anemia.  Platelets within normal range.  No electrolyte abnormalities.  No renal dysfunction.  No transaminitis.  Urine pregnancy negative.   Imaging Studies ordered:  I ordered imaging studies including pelvic ultrasound I independently visualized and interpreted imaging which showed no acute finding in pelvis or normal free fluid.  Normal appearance of endometrium.  2.0 cm solid lesion and uterine wall likely reflecting fibroid.  Simple appearing bilateral adnexal cysts measuring up to 3.2 cm. I agree with the radiologist interpretation  Cardiac Monitoring: / EKG:  The patient was maintained on a cardiac monitor.  I personally viewed and interpreted the cardiac monitored which showed an underlying rhythm of: Sinus rhythm   Consultations Obtained:  N/a   Problem List / ED Course / Critical interventions / Medication management  Vaginal bleeding Reevaluation of the patient showed that the patient  stayed the same I have reviewed the patients home medicines and have made adjustments as needed   Social Determinants of Health:  Chronic cigarette use.  Denies illicit drug  use   Test / Admission - Considered:  Vaginal bleeding Vitals signs within normal range and stable throughout visit. Laboratory/imaging studies significant for: See above 47 year old female presents emergency department with complaints of continued vaginal bleeding without pelvic/abdominal pain.  Workup today overall reassuring.  Patient without evidence of acute anemia.  Patient hemodynamically stable with vital signs within normal range throughout stay.  Patient with ultrasound findings indicative of fibroid as most likely cause of her persistent bleeding from her menstrual cycle.  Patient recommended follow-up with OB/GYN for reassessment/direct meant of treatment course going forward.  Further workup deemed unnecessary at this time given no evidence of exsanguination, hemodynamically instability, presence of anemia.  Treatment plan discussed at length with patient and she acknowledged understanding was agreeable to said plan. Worrisome signs and symptoms were discussed with the patient, and the patient acknowledged understanding to return to the ED if noticed. Patient was stable upon discharge.          Final Clinical Impression(s) / ED Diagnoses Final diagnoses:  Vaginal bleeding    Rx / DC Orders ED Discharge Orders     None         Peter Garter, [place] 03/12/23 1335    Loetta Rough, MD 03/12/23 [phone]

## 2023-03-13 ENCOUNTER — Ambulatory Visit: Payer: 59 | Admitting: Physical Therapy

## 2023-03-20 ENCOUNTER — Other Ambulatory Visit: Payer: Self-pay

## 2023-03-20 ENCOUNTER — Ambulatory Visit (INDEPENDENT_AMBULATORY_CARE_PROVIDER_SITE_OTHER): Payer: 59 | Admitting: Allergy & Immunology

## 2023-03-20 ENCOUNTER — Encounter: Payer: Self-pay | Admitting: Allergy & Immunology

## 2023-03-20 VITALS — BP 120/78 | HR 80 | Temp 98.5°F | Ht 68.0 in | Wt 322.2 lb

## 2023-03-20 DIAGNOSIS — J3089 Other allergic rhinitis: Secondary | ICD-10-CM | POA: Diagnosis not present

## 2023-03-20 DIAGNOSIS — J4540 Moderate persistent asthma, uncomplicated: Secondary | ICD-10-CM

## 2023-03-20 DIAGNOSIS — L732 Hidradenitis suppurativa: Secondary | ICD-10-CM

## 2023-03-20 DIAGNOSIS — T7800XD Anaphylactic reaction due to unspecified food, subsequent encounter: Secondary | ICD-10-CM | POA: Diagnosis not present

## 2023-03-20 MED ORDER — LEVOCETIRIZINE DIHYDROCHLORIDE 5 MG PO TABS: ORAL_TABLET | ORAL | 1 refills | Status: DC

## 2023-03-20 MED ORDER — CROMOLYN SODIUM 4 % OP SOLN: 1.0000 [drp] | Freq: Four times a day (QID) | OPHTHALMIC | 5 refills | Status: AC

## 2023-03-20 MED ORDER — MONTELUKAST SODIUM 10 MG PO TABS: 10.0000 mg | ORAL_TABLET | Freq: Every day | ORAL | 1 refills | Status: DC

## 2023-03-20 MED ORDER — AZELASTINE HCL 137 MCG/SPRAY NA SOLN: NASAL | 5 refills | Status: DC

## 2023-03-20 MED ORDER — TRELEGY ELLIPTA 200-62.5-25 MCG/ACT IN AEPB: 1.0000 | INHALATION_SPRAY | Freq: Every day | RESPIRATORY_TRACT | 5 refills | Status: DC

## 2023-03-20 NOTE — Patient Instructions (Addendum)
1. Moderate persistent asthma, uncomplicated - Lung testing looks stable today,  - We are not going to make any allergies.  - Daily controller medication(s): Trelegy 200/62.5/25 one puff once daily - Prior to physical activity: albuterol 2 puffs 10-15 minutes before physical activity. - Rescue medications: albuterol 4 puffs every 4-6 hours as needed - Asthma control goals:  * Full participation in all desired activities (may need albuterol before activity) * Albuterol use two time or less a week on average (not counting use with activity) * Cough interfering with sleep two time or less a month * Oral steroids no more than once a year * No hospitalizations  2. Anaphylactic shock due to food (shellfish) - EpiPen refilled.    3. Perennial and seasonal allergic rhinitis (grasses, weeds, ragweed, trees, molds, cat, dog, cockroach, and dust mite) - Continue with fluticasone nasal spray two sprays per nostril daily EVERY DAY (brown bottle).  - Continue with levocetirizine  tablet once daily.  - Continue montelukast (Singulair)  daily.    4. Suppurativa hiadrenitis - Continue following with Dr. Roseanne Reno. - I will send my note to her to keep her in the loop.   5. Return in about 6 months (around 10/31/2021).   Please inform us of any Emergency Department visits, hospitalizations, or changes in symptoms. Call us before going to the ED for breathing or allergy symptoms since we might be able to fit you in for a sick visit. Feel free to contact us anytime with any questions, problems, or concerns.  It was a pleasure to see you and your family again today!  Websites that have reliable patient information: 1. American Academy of Asthma, Allergy, and Immunology: www.aaaai.org 2. Food Allergy Research and Education (FARE): foodallergy.org 3. Mothers of Asthmatics: http://www.asthmacommunitynetwork.org 4. American College of Allergy, Asthma, and Immunology: www.acaai.org   COVID-19 Vaccine  Information can be found at: [email or web address].[email or web address] For questions related to vaccine distribution or appointments, please email vaccine@ [email or web address] or call [phone].   We realize that you might be concerned about having an allergic reaction to the COVID19 vaccines. To help with that concern, WE ARE OFFERING THE COVID19 VACCINES IN OUR OFFICE! Ask the front desk for dates!     "Like" [place] on Facebook and Instagram for our latest updates!      A healthy democracy works best when [employer] participate! Make sure you are registered to vote! If you have moved or changed any of your contact information, you will need to get this updated before voting!  In some cases, you MAY be able to register to vote online: [email or web address]

## 2023-03-20 NOTE — Progress Notes (Signed)
FOLLOW UP  Date of Service/Encounter:  03/20/23   Assessment:   Seasonal and perennial allergic rhinitis (grasses, weeds, ragweed, trees, molds, cat, dog, cockroach, and dust mite)   Moderate persistent asthma, uncomplicated   Anaphylactic shock due to food (shellfish)   Hidradenitis suppurativa    Mastitis with possible abscess - starting clindamycin TID x 1 week to clear this up (strong suspicion for MRSA given the purulence)   Plan/Recommendations:   1. Moderate persistent asthma, uncomplicated - Lung testing looks stable today,  - We are not going to make any allergies.  - Daily controller medication(s): Trelegy 200/62.5/25 one puff once daily - Prior to physical activity: albuterol 2 puffs 10-15 minutes before physical activity. - Rescue medications: albuterol 4 puffs every 4-6 hours as needed - Asthma control goals:  * Full participation in all desired activities (may need albuterol before activity) * Albuterol use two time or less a week on average (not counting use with activity) * Cough interfering with sleep two time or less a month * Oral steroids no more than once a year * No hospitalizations  2. Anaphylactic shock due to food (shellfish) - EpiPen refilled.    3. Perennial and seasonal allergic rhinitis (grasses, weeds, ragweed, trees, molds, cat, dog, cockroach, and dust mite) - Continue with fluticasone nasal spray two sprays per nostril daily EVERY DAY (brown bottle).  - Continue with levocetirizine 5mg  tablet once daily.  - Continue montelukast (Singulair) 10mg  daily.    4. Suppurativa hiadrenitis - Continue following with Dr. Roseanne Reno. - I will send my note to her to keep her in the loop.   5. Return in about 6 months (around 10/31/2021).    Subjective:   [name] is a 47 y.o. female presenting today for follow up of  Chief Complaint  Patient presents with   Asthma   Follow-up    [name] has a history of the  following: Patient Active Problem List   Diagnosis Date Noted   Left lumbar radiculopathy 03/03/2023   Morbid obesity (HCC) 03/03/2023   Chronic pain syndrome 03/03/2023   Hip arthritis 02/25/2023   Irregular periods 01/29/2023   Encounter for fertility planning 01/29/2023   Primary osteoarthritis of right hip 01/21/2023   Graves disease 07/06/2019   Hyperthyroidism 07/06/2019   Current smoker 05/26/2019   Seasonal and perennial allergic rhinitis 03/25/2018   Anaphylactic shock due to adverse food reaction 08/19/2017   Perennial and seasonal allergic rhinitis 08/19/2017   Moderate persistent asthma without complication 08/19/2017    History obtained from: chart review and patient.  [name] is a 47 y.o. female presenting for a follow up visit.  She was last seen by Nehemiah Settle in March 2024.  At that time, we got her appointment with her ophthalmologist for her marked periorbital swelling.  Prior to that, she was seen by Nehemiah Settle for management of an asthma exacerbation.  She was continued on Trelegy 200 mcg 1 puff once daily.  She was also started on a prednisone burst.  I last saw her for regular visit in December 2023.  At that time, we changed her to Trelegy 200 mcg 1 puff once daily as well as continuing with albuterol as needed.  For her allergic rhinitis, we continue with Flonase 2 sprays per nostril every day as well as levocetirizine and montelukast.  For her hidradenitis suppurativa, we referred her to Dr. Willeen Niece.  Since last visit, she has done very well.   Asthma/Respiratory  Symptom History: She remains on the Trelegy once puff once daily. She uses her rescue inhaler rarely. But she uses the nebulizer a bit more frequently. She last used it when she came in for her eye.  She likes the use of the Trelegy one puff once daily. She enjoys that this is a one time daily dose. She does not like the taste of it, but the one time dosing is enough to keep her on it. [name]'s  asthma has been well controlled. She has not required rescue medication, experienced nocturnal awakenings due to lower respiratory symptoms, nor have activities of daily living been limited. She has required no Emergency Department or Urgent Care visits for her asthma. She has required zero courses of systemic steroids for asthma exacerbations since the last visit. ACT score today is 25, indicating excellent asthma symptom control.   Allergic Rhinitis Symptom History: She has improved with her ocular symptoms since the last visit.  She remains on her Flonase as well as the montelukast and the levocetirizine. She has not been on antibiotics for sinus infections or ear infections since the last visit.   Food Allergy Symptom History: She originally broke up with exposure to crab legs. This was in 2018. She got Benadryl and an epinephrine autoinjector.  She really wants to eat shellfish again. She is hoping to eat some crabs when she is in [place] later this year for a reunion.   Skin Symptom History: She is on Cosentyx once monthly for her hiadrenitis. She thinks that it is working well and she has not had any new breakouts. She might have to have surgery if these do not clear. Her daughter [name] has it now as well. She has improvement in the areas on her breast as well.   She is going to go in for a gastric sleeve on May 2nd. The hip doctor wants to do surgery, but she is trying to lose weight for that. This is the reason that she needs to have the gastric sleeve.   Otherwise, there have been no changes to her past medical history, surgical history, family history, or social history.    Review of Systems  Constitutional:  Negative for chills, fever, malaise/fatigue and weight loss.  HENT:  Positive for congestion. Negative for ear discharge and nosebleeds.        Reports clear rhinorrhea and denies nasal congestion and post nasal drip. She did report pressure on the right side of nose, but this got  better with Benadryl.  Eyes:  Negative for blurred vision, double vision, pain, discharge and redness.       Reports a little pressure where red is in eye, pain above and below eye, burning, little itching, and white/clear drainage. Denies pain with movement of eye. Also denies blurred or double vision  Respiratory: Negative.  Negative for cough, shortness of breath and wheezing.   Cardiovascular:  Negative for chest pain and palpitations.  Gastrointestinal:  Negative for heartburn, nausea and vomiting.       Denies heartburn or reflux  Skin:  Negative for itching and rash.  Neurological:  Negative for headaches.  Endo/Heme/Allergies:  Positive for environmental allergies.       Objective:   Blood pressure 120/78, pulse 80, temperature 98.5 F (36.9 C), temperature source Temporal, height  (1.727 m), weight (!) 322 lb 3.2 oz (146.1 kg), SpO2 96 %. Body mass index is 48.99 kg/m.    Physical Exam Vitals reviewed.  Constitutional:  Appearance: She is well-developed.     Comments: Lost a lot of weight since I saw her last time.   HENT:     Head: Normocephalic and atraumatic.     Right Ear: Tympanic membrane, ear canal and external ear normal.     Left Ear: Tympanic membrane, ear canal and external ear normal.     Nose: No nasal deformity, septal deviation, mucosal edema or rhinorrhea.     Right Turbinates: Enlarged, swollen and pale.     Left Turbinates: Enlarged, swollen and pale.     Right Sinus: No maxillary sinus tenderness or frontal sinus tenderness.     Left Sinus: No maxillary sinus tenderness or frontal sinus tenderness.     Mouth/Throat:     Lips: Pink.     Mouth: Mucous membranes are moist. Mucous membranes are not pale and not dry.     Pharynx: Uvula midline.     Comments: Cobblestoning present in the posterior oropharynx. Eyes:     General: Allergic shiner present.        Right eye: No discharge.        Left eye: No discharge.     Conjunctiva/sclera:  Conjunctivae normal.     Right eye: Right conjunctiva is not injected. No chemosis.    Left eye: Left conjunctiva is not injected. No chemosis.    Pupils: Pupils are equal, round, and reactive to light.  Cardiovascular:     Rate and Rhythm: Normal rate and regular rhythm.     Heart sounds: Normal heart sounds.  Pulmonary:     Effort: Pulmonary effort is normal. No tachypnea, accessory muscle usage or respiratory distress.     Breath sounds: Normal breath sounds. No wheezing, rhonchi or rales.     Comments: Moving air well in all lung fields. No increased work of breathing noted.  Chest:     Chest wall: No tenderness.  Lymphadenopathy:     Cervical: No cervical adenopathy.  Skin:    General: Skin is warm.     Capillary Refill: Capillary refill takes less than 2 seconds.     Coloration: Skin is not pale.     Findings: Rash present. No abrasion, erythema or petechiae. Rash is not papular, urticarial or vesicular.     Comments: She has some purulence and the erythema under her right breast. The breast is painful to palpation.   Neurological:     Mental Status: She is alert.  Psychiatric:        Behavior: Behavior is cooperative.     Diagnostic studies:    Spirometry: results abnormal (FEV1: 2.09/75%, FVC: 3.10/89%, FEV1/FVC: 67%).    Spirometry consistent with moderate obstructive disease.   Allergy Studies: none      Malachi Bonds, MD  [facility]

## 2023-03-21 ENCOUNTER — Ambulatory Visit (INDEPENDENT_AMBULATORY_CARE_PROVIDER_SITE_OTHER): Payer: 59 | Admitting: Nurse Practitioner

## 2023-03-21 DIAGNOSIS — L732 Hidradenitis suppurativa: Secondary | ICD-10-CM

## 2023-03-21 DIAGNOSIS — Z6841 Body Mass Index (BMI) 40.0 and over, adult: Secondary | ICD-10-CM | POA: Diagnosis not present

## 2023-03-21 DIAGNOSIS — E0500 Thyrotoxicosis with diffuse goiter without thyrotoxic crisis or storm: Secondary | ICD-10-CM | POA: Diagnosis not present

## 2023-03-21 LAB — COMPREHENSIVE METABOLIC PANEL
ALT: 11 U/L (ref 0–35)
AST: 15 U/L (ref 0–37)
Albumin: 3.5 g/dL (ref 3.5–5.2)
Alkaline Phosphatase: 75 U/L (ref 39–117)
BUN: 12 mg/dL (ref 6–23)
CO2: 29 mEq/L (ref 19–32)
Calcium: 8.7 mg/dL (ref 8.4–10.5)
Chloride: 103 mEq/L (ref 96–112)
Creatinine, Ser: 0.63 mg/dL (ref 0.40–1.20)
GFR: 106.42 mL/min (ref >60.00)
Glucose, Bld: 89 mg/dL (ref 70–99)
Potassium: 4 mEq/L (ref 3.5–5.1)
Sodium: 138 mEq/L (ref 135–145)
Total Bilirubin: 0.2 mg/dL (ref 0.2–1.2)
Total Protein: 7.8 g/dL (ref 6.0–8.3)

## 2023-03-21 LAB — CBC
HCT: 37.5 % (ref 36.0–46.0)
Hemoglobin: 12 g/dL (ref 12.0–15.0)
MCHC: 31.9 g/dL (ref 30.0–36.0)
MCV: 83.4 fl (ref 78.0–100.0)
Platelets: 207 10*3/uL (ref 150.0–400.0)
RBC: 4.5 Mil/uL (ref 3.87–5.11)
RDW: 18.9 % — ABNORMAL HIGH (ref 11.5–15.5)
WBC: 9.4 10*3/uL (ref 4.0–10.5)

## 2023-03-21 LAB — LIPID PANEL
Cholesterol: 190 mg/dL (ref 0–200)
HDL: 55.4 mg/dL (ref >39.00)
LDL Cholesterol: 126 mg/dL — ABNORMAL HIGH (ref 0–99)
NonHDL: 134.83
Total CHOL/HDL Ratio: 3
Triglycerides: 43 mg/dL (ref 0.0–149.0)
VLDL: 8.6 mg/dL (ref 0.0–40.0)

## 2023-03-21 LAB — TSH: TSH: 1.45 u[IU]/mL (ref 0.35–5.50)

## 2023-03-21 LAB — T4, FREE: Free T4: 0.72 ng/dL (ref 0.60–1.60)

## 2023-03-21 NOTE — Assessment & Plan Note (Signed)
Chronic Has multiple draining lesions currently, reports pain is much improved.  Do not look acutely infected right now. Follow-up with dermatology as scheduled

## 2023-03-21 NOTE — Assessment & Plan Note (Addendum)
Chronic Patient has made changes to her lifestyle which is also helped her with weight loss recently Patient plans to follow-up with bariatric surgery in approximately 2 weeks Discussed possibility of other treatment options including GLP-1 agonist or GIP/GLP-1 agonist, patient would prefer surgery at this time Additionally, labs ordered further recommendations may be made based upon these results

## 2023-03-21 NOTE — Progress Notes (Signed)
New Patient Office Visit  Subjective    Patient ID: [name], female    DOB: June 10, 1976  Age: 47 y.o. MRN: [id number]  CC:  Chief Complaint  Patient presents with   New Patient (Initial Visit)    Establish care, talk about gastric sleeve surgery    HPI [name] presents to establish care She reports she is preparing to undergo bariatric surgery, with plans for gastric sleeve.  This is so that she can be a better surgical candidate for a hip replacement due to severe osteoarthritis. She follows with sports medicine currently for this Also has chronic hidradenitis suppurativa treated by dermatology.  Has multiple draining lesions currently, reports recently started Cosentyx injections in February with improvement in symptoms.  Denies any pain today. She also has moderate persistent asthma and is treated by asthma specialist.  No acute concerns today.  Outpatient Encounter Medications as of 03/21/2023  Medication Sig   albuterol (PROAIR HFA) 108 (90 Base) MCG/ACT inhaler Inhale 4 puffs every 4-6 hours as needed   albuterol (PROVENTIL) (2.5 MG/3ML) 0.083% nebulizer solution Take 3 mLs (2.5 mg total) by nebulization every 4 (four) hours as needed for wheezing or shortness of breath.   albuterol (VENTOLIN HFA) 108 (90 Base) MCG/ACT inhaler Inhale 2 puffs by mouth every 4 hours as needed for wheezing or shortness of breath   amitriptyline (ELAVIL) 25 MG tablet Take 25 mg by mouth at bedtime.   Azelastine HCl 137 MCG/SPRAY SOLN Use 2 sprays into each nostril twice daily   clindamycin (CLEOCIN T) 1 % external solution Apply to open sores QD after shower.   cromolyn (OPTICROM) 4 % ophthalmic solution Place 1 drop into both eyes 4 (four) times daily.   cyclobenzaprine (FLEXERIL) 10 MG tablet Take 10 mg by mouth at bedtime.   dorzolamide-timolol (COSOPT) 2-0.5 % ophthalmic solution 1 drop 2 (two) times daily.   EPINEPHRINE 0.3 mg/0.3 mL IJ SOAJ injection Inject 0.55ml  subcutaneously as needed for anaphylaxis   ergocalciferol (VITAMIN D2) 1.25 MG (50000 UT) capsule Take 50,000 Units by mouth once a week.   esomeprazole (NEXIUM) 40 MG capsule Take 1 capsule by mouth twice daily   ferrous sulfate 325 (65 FE) MG EC tablet Take 325 mg by mouth 3 (three) times daily with meals.   fluconazole (DIFLUCAN) 150 MG tablet Take 1 tablet (150 mg total) by mouth daily.   Fluticasone-Umeclidin-Vilant (TRELEGY ELLIPTA) 200-62.5-25 MCG/ACT AEPB Inhale 1 puff into the lungs daily.   ibuprofen (ADVIL) 800 MG tablet Take 1 tablet (800 mg total) by mouth every 8 (eight) hours as needed.   levocetirizine (XYZAL) 5 MG tablet Take 1 tablet by mouth every day as needed for allergies   minocycline (MINOCIN) 100 MG capsule Take 1-2 capsules by mouth daily.   montelukast (SINGULAIR) 10 MG tablet Take 1 tablet (10 mg total) by mouth at bedtime.   oxyCODONE-acetaminophen (PERCOCET/ROXICET) 5-325 MG tablet Take 0.5-1 tablets by mouth 2 (two) times daily as needed for severe pain.   Secukinumab (COSENTYX UNOREADY) 300 MG/2ML SOAJ Inject 1 Pen into the skin every 28 (twenty-eight) days.   Spacer/Aero-Holding Chambers (AEROCHAMBER PLUS) inhaler Use as instructed   tiZANidine (ZANAFLEX) 4 MG tablet TAKE 1 TABLET BY MOUTH AT BEDTIME.   No facility-administered encounter medications on file as of 03/21/2023.    Past Medical History:  Diagnosis Date   Asthma    Hydradenitis    MRSA (methicillin resistant staph aureus) culture positive    Obesity  Past Surgical History:  Procedure Laterality Date   CESAREAN SECTION     x2   TUBAL LIGATION     WISDOM TOOTH EXTRACTION  2011    Family History  Problem Relation Age of Onset   Diabetes Mother    Hypertension Mother    Diabetes Father    Asthma Other    Hyperlipidemia Other    Hypertension Other     Social History   Socioeconomic History   Marital status: Single    Spouse name: Not on file   Number of children: Not on file    Years of education: Not on file   Highest education level: Not on file  Occupational History   Not on file  Tobacco Use   Smoking status: Some Days    Packs/day: .33    Types: Cigarettes   Smokeless tobacco: Never  Vaping Use   Vaping Use: Never used  Substance and Sexual Activity   Alcohol use: Yes    Comment: once a month at a party beer wine or mixed drinks   Drug use: No   Sexual activity: Yes    Birth control/protection: None  Other Topics Concern   Not on file  Social History Narrative   Not on file   Social Determinants of Health   Financial Resource Strain: Not on file  Food Insecurity: Not on file  Transportation Needs: Not on file  Physical Activity: Not on file  Stress: Not on file  Social Connections: Not on file  Intimate Partner Violence: Not on file    Review of Systems  Respiratory:  Negative for cough and shortness of breath.   Cardiovascular:  Negative for chest pain.  Gastrointestinal:  Negative for blood in stool.        Objective    There were no vitals taken for this visit.  Physical Exam Vitals reviewed.  Constitutional:      General: She is not in acute distress.    Appearance: Normal appearance.  HENT:     Head: Normocephalic and atraumatic.  Neck:     Vascular: No carotid bruit.  Cardiovascular:     Rate and Rhythm: Normal rate and regular rhythm.     Pulses: Normal pulses.     Heart sounds: Normal heart sounds.  Pulmonary:     Effort: Pulmonary effort is normal.     Breath sounds: Normal breath sounds.  Skin:    General: Skin is warm and dry.     Comments: Has bilateral scarring of axilla. Has multiple draining lesions on each breast. No redness, swelling.   Neurological:     General: No focal deficit present.     Mental Status: She is alert and oriented to person, place, and time.  Psychiatric:        Mood and Affect: Mood normal.        Behavior: Behavior normal.        Judgment: Judgment normal.          Assessment & Plan:   Problem List Items Addressed This Visit       Endocrine   Graves disease - Primary    Due for thyroid panel, labs ordered further recommendations may be made based upon his results      Relevant Orders   CBC   Comprehensive metabolic panel   Lipid panel   TSH   T4, free     Musculoskeletal and Integument   Hidradenitis suppurativa    Chronic Has multiple  draining lesions currently, reports pain is much improved.  Do not look acutely infected right now. Follow-up with dermatology as scheduled        Other   Class 3 severe obesity due to excess calories with body mass index (BMI) of 45.0 to 49.9 in adult [facility])    Chronic Patient has made changes to her lifestyle which is also helped her with weight loss recently Patient plans to follow-up with bariatric surgery in approximately 2 weeks Discussed possibility of other treatment options including GLP-1 agonist or GIP/GLP-1 agonist, patient would prefer surgery at this time Additionally, labs ordered further recommendations may be made based upon these results      Relevant Orders   CBC   Comprehensive metabolic panel   Lipid panel   TSH   T4, free    Return in about 1 month (around 04/20/2023) for F/U with Maralyn Sago.   Elenore Paddy, NP

## 2023-03-21 NOTE — Assessment & Plan Note (Signed)
Due for thyroid panel, labs ordered further recommendations may be made based upon his results

## 2023-03-24 ENCOUNTER — Encounter: Payer: Self-pay | Admitting: Obstetrics and Gynecology

## 2023-03-24 ENCOUNTER — Ambulatory Visit (INDEPENDENT_AMBULATORY_CARE_PROVIDER_SITE_OTHER): Payer: 59 | Admitting: Obstetrics and Gynecology

## 2023-03-24 VITALS — BP 137/86 | HR 76 | Ht 68.0 in | Wt 326.0 lb

## 2023-03-24 DIAGNOSIS — N938 Other specified abnormal uterine and vaginal bleeding: Secondary | ICD-10-CM | POA: Diagnosis not present

## 2023-03-24 NOTE — Progress Notes (Signed)
47 y.o GYN presents for ED follow up of Fibroids/AUB.   Pt bled from 04/05-20/2024.   US shows Fibroids.  Last PAP 01/03/2023

## 2023-03-24 NOTE — Progress Notes (Signed)
[name] presents for ER follow up. Seen in ER for prolonged, heavy last cycle. Bleeding has stopped Cycles are monthly . This past month lasted several days more than usual. GYN U/S showed uterine fibroids  PE AF VSS Lungs clear Heart RRR Abd soft + BS   A/P  DUB x 1 episode  Reviewed with pt. Since first episode, we will follow for now. Pt instructed to keep a menstrual calendar and if has more episodes of DUB to call the office for an appt.

## 2023-03-25 ENCOUNTER — Other Ambulatory Visit: Payer: Self-pay

## 2023-03-25 ENCOUNTER — Ambulatory Visit (INDEPENDENT_AMBULATORY_CARE_PROVIDER_SITE_OTHER): Payer: 59

## 2023-03-25 ENCOUNTER — Ambulatory Visit (HOSPITAL_COMMUNITY)
Admission: EM | Admit: 2023-03-25 | Discharge: 2023-03-25 | Disposition: A | Discharge status: Home / Self Care | Payer: 59 | Attending: Emergency Medicine | Admitting: Emergency Medicine

## 2023-03-25 ENCOUNTER — Ambulatory Visit (INDEPENDENT_AMBULATORY_CARE_PROVIDER_SITE_OTHER): Payer: 59 | Admitting: Family Medicine

## 2023-03-25 VITALS — BP 146/94 | HR 68 | Ht 68.0 in

## 2023-03-25 DIAGNOSIS — M25562 Pain in left knee: Secondary | ICD-10-CM

## 2023-03-25 LAB — ALLERGEN PROFILE, SHELLFISH
Clam IgE: 1.59 kU/L — AB
F023-IgE Crab: 1.62 kU/L — AB
F080-IgE Lobster: 1.76 kU/L — AB
F290-IgE Oyster: 0.31 kU/L — AB
Scallop IgE: 4.42 kU/L — AB
Shrimp IgE: 8.48 kU/L — AB

## 2023-03-25 MED ORDER — IBUPROFEN 800 MG PO TABS: 800.0000 mg | ORAL_TABLET | Freq: Three times a day (TID) | ORAL | 0 refills | Status: DC

## 2023-03-25 NOTE — Discharge Instructions (Addendum)
You may have a soft tissue injury such as ligament, tendon, muscle. I recommend to try RICE therapy  Rest - try to avoid heavy lifting and high impact activity Ice - apply for 20 minutes a few times daily Compression - use ace wrap as needed when standing/walking Elevation - prop up on a pillow  Continue ibuprofen for pain - 800 mg every 6 hours as needed. Take with food!!  Please follow up with your orthopedic specialist.  You can also see [facility] - they have walk-in hours or you can call to make an appointment.   You can always return to urgent care if you change your mind about xray

## 2023-03-25 NOTE — ED Provider Notes (Signed)
[facility]    CSN: [id number] Arrival date & time: 03/25/23  0820      History   Chief Complaint Chief Complaint  Patient presents with   Knee Pain    HPI [name] is a 47 y.o. female.  Here with LEFT knee pain.  Reports while walking yesterday she heard a pop.  6/10 pain with weightbearing.  She does have good range of motion but feels like her "bones are tight" Denies any blunt injury or trauma Took ibuprofen and percocet  She denies prior injury to this knee. Does have history of right hip OA, chronic pain  She sees pain management  Past Medical History:  Diagnosis Date   Asthma    Hydradenitis    MRSA (methicillin resistant staph aureus) culture positive    Obesity     Patient Active Problem List   Diagnosis Date Noted   DUB (dysfunctional uterine bleeding) 03/24/2023   Class 3 severe obesity due to excess calories with body mass index (BMI) of 45.0 to 49.9 in adult [facility]) 03/21/2023   Hidradenitis suppurativa 03/21/2023   Left lumbar radiculopathy 03/03/2023   Morbid obesity (HCC) 03/03/2023   Chronic pain syndrome 03/03/2023   Hip arthritis 02/25/2023   Irregular periods 01/29/2023   Encounter for fertility planning 01/29/2023   Primary osteoarthritis of right hip 01/21/2023   Graves disease 07/06/2019   Hyperthyroidism 07/06/2019   Current smoker 05/26/2019   Seasonal and perennial allergic rhinitis 03/25/2018   Perennial and seasonal allergic rhinitis 08/19/2017   Moderate persistent asthma without complication 08/19/2017    Past Surgical History:  Procedure Laterality Date   CESAREAN SECTION     x2   TUBAL LIGATION     WISDOM TOOTH EXTRACTION  2011    OB History     Gravida  3   Para  2   Term  2   Preterm      AB  1   Living         SAB  1   IAB      Ectopic      Multiple      Live Births               Home Medications    Prior to Admission medications   Medication Sig Start Date End Date  Taking? Authorizing Provider  albuterol (VENTOLIN HFA) 108 (90 Base) MCG/ACT inhaler Inhale 2 puffs by mouth every 4 hours as needed for wheezing or shortness of breath 01/24/23  Yes Nehemiah Settle, FNP  amitriptyline (ELAVIL) 25 MG tablet Take 25 mg by mouth at bedtime. 05/22/22  Yes [provider]  ergocalciferol (VITAMIN D2) 1.25 MG (50000 UT) capsule Take 50,000 Units by mouth once a week.   Yes [provider]  esomeprazole (NEXIUM) 40 MG capsule Take 1 capsule by mouth twice daily 11/20/22  Yes Alfonse Spruce, MD  Fluticasone-Umeclidin-Vilant (TRELEGY ELLIPTA) 200-62.5-25 MCG/ACT AEPB Inhale 1 puff into the lungs daily. 03/20/23  Yes Alfonse Spruce, MD  ibuprofen (ADVIL) 800 MG tablet Take 1 tablet (800 mg total) by mouth 3 (three) times daily. 03/25/23  Yes Komal Stangelo, Lurena Joiner, PA-C  levocetirizine (XYZAL) 5 MG tablet Take 1 tablet by mouth every day as needed for allergies 03/20/23  Yes Alfonse Spruce, MD  montelukast (SINGULAIR) 10 MG tablet Take 1 tablet (10 mg total) by mouth at bedtime. 03/20/23  Yes Alfonse Spruce, MD  oxyCODONE-acetaminophen (PERCOCET/ROXICET) 5-325 MG tablet Take 0.5-1  tablets by mouth 2 (two) times daily as needed for severe pain. 03/06/23  Yes Engler, Morgan C, DO  Secukinumab (COSENTYX UNOREADY) 300 MG/2ML SOAJ Inject 1 Pen into the skin every 28 (twenty-eight) days. 01/08/23  Yes Willeen Niece, MD  Spacer/Aero-Holding Chambers (AEROCHAMBER PLUS) inhaler Use as instructed 08/05/17  Yes Domenick Gong, MD  tiZANidine (ZANAFLEX) 4 MG tablet TAKE 1 TABLET BY MOUTH AT BEDTIME. 02/12/23  Yes Rodolph Bong, MD  albuterol [facility] HFA) [phone] Base) MCG/ACT inhaler Inhale 4 puffs every 4-6 hours as needed 05/01/21   Alfonse Spruce, MD  albuterol (PROVENTIL) (2.5 MG/3ML) 0.083% nebulizer solution Take 3 mLs (2.5 mg total) by nebulization every 4 (four) hours as needed for wheezing or shortness of breath. 02/13/23   Alfonse Spruce,  MD  Azelastine HCl 137 MCG/SPRAY SOLN Use 2 sprays into each nostril twice daily 03/20/23   Alfonse Spruce, MD  clindamycin (CLEOCIN T) 1 % external solution Apply to open sores QD after shower. 01/08/23   Willeen Niece, MD  cromolyn (OPTICROM) 4 % ophthalmic solution Place 1 drop into both eyes 4 (four) times daily. 03/20/23   Alfonse Spruce, MD  cyclobenzaprine (FLEXERIL) 10 MG tablet Take 10 mg by mouth at bedtime.    [provider]  dorzolamide-timolol (COSOPT) 2-0.5 % ophthalmic solution 1 drop 2 (two) times daily.    [provider]  EPINEPHRINE 0.3 mg/0.3 mL IJ SOAJ injection Inject 0.45ml subcutaneously as needed for anaphylaxis 02/10/23   Nehemiah Settle, FNP  ferrous sulfate 325 (65 FE) MG EC tablet Take 325 mg by mouth 3 (three) times daily with meals.    [provider]  fluconazole (DIFLUCAN) 150 MG tablet Take 1 tablet (150 mg total) by mouth daily. 02/05/23   Willeen Niece, MD  minocycline (MINOCIN) 100 MG capsule Take 1-2 capsules by mouth daily. 02/05/23   Willeen Niece, MD    Family History Family History  Problem Relation Age of Onset   Diabetes Mother    Hypertension Mother    Diabetes Father    Asthma Other    Hyperlipidemia Other    Hypertension Other     Social History Social History   Tobacco Use   Smoking status: Some Days    Packs/day: .33    Types: Cigarettes   Smokeless tobacco: Never  Vaping Use   Vaping Use: Never used  Substance Use Topics   Alcohol use: Yes    Comment: once a month at a party beer wine or mixed drinks   Drug use: No     Allergies   Shellfish allergy   Review of Systems Review of Systems As per HPI  Physical Exam Triage Vital Signs ED Triage Vitals  Enc Vitals Group     BP 03/25/23 0857 134/83     Pulse Rate 03/25/23 0857 67     Resp 03/25/23 0857 18     Temp 03/25/23 0857 97.8 F (36.6 C)     Temp Source 03/25/23 0857 Oral     SpO2 03/25/23 0857 98 %     Weight --       Height --      Head Circumference --      Peak Flow --      Pain Score 03/25/23 0900 6     Pain Loc --      Pain Edu? --      Excl. in GC? --    No data found.  Updated Vital  Signs BP 134/83 (BP Location: Left Arm)   Pulse 67   Temp 97.8 F (36.6 C) (Oral)   Resp 18   LMP 02/28/2023 (Exact Date)   SpO2 98%     Physical Exam Vitals and nursing note reviewed.  Constitutional:      General: She is not in acute distress.    Appearance: She is not ill-appearing.  HENT:     Mouth/Throat:     Pharynx: Oropharynx is clear.  Cardiovascular:     Rate and Rhythm: Normal rate and regular rhythm.     Pulses: Normal pulses.  Pulmonary:     Effort: Pulmonary effort is normal.  Musculoskeletal:        General: No swelling, tenderness, deformity or signs of injury. Normal range of motion.     Cervical back: Normal range of motion.     Left lower leg: No edema.     Comments: Habitus limits exam. Full ROM of bilateral knees. No bony tenderness or obvious deformity. No laxity of ligaments. Strong DP pulse, sensation intact distally, no LE swelling. No skin changes noted.   Skin:    General: Skin is warm and dry.     Capillary Refill: Capillary refill takes less than 2 seconds.  Neurological:     Mental Status: She is alert and oriented to person, place, and time.     Gait: Gait normal.     Comments: Can bear weight on the left leg. Normal gait     UC Treatments / Results  Labs (all labs ordered are listed, but only abnormal results are displayed) Labs Reviewed - No data to display  EKG  Radiology No results found.  Procedures Procedures (including critical care time)  Medications Ordered in UC Medications - No data to display  Initial Impression / Assessment and Plan / UC Course  I have reviewed the triage vital signs and the nursing notes.  Pertinent labs & imaging results that were available during my care of the patient were reviewed by me and considered in my  medical decision making (see chart for details).  Discussion with patient about xray, based on her exam I have low concern for acute bony abnormality. She does have an orthopedic specialist she sees. Shared decision making will defer xray today. Discussed RICE therapy, provided with Ace wrap. She will continue ibuprofen and her prescribed percocet through pain clinic. Close follow with ortho, can always return here if needed. Patient agreeable to plan  Final Clinical Impressions(s) / UC Diagnoses   Final diagnoses:  Acute pain of left knee     Discharge Instructions      You may have a soft tissue injury such as ligament, tendon, muscle. I recommend to try RICE therapy  Rest - try to avoid heavy lifting and high impact activity Ice - apply for 20 minutes a few times daily Compression - use ace wrap as needed when standing/walking Elevation - prop up on a pillow  Continue ibuprofen for pain - 800 mg every 6 hours as needed. Take with food!!  Please follow up with your orthopedic specialist.  You can also see [facility] - they have walk-in hours or you can call to make an appointment.   You can always return to urgent care if you change your mind about xray    ED Prescriptions     Medication Sig Dispense Auth. Provider   ibuprofen (ADVIL) 800 MG tablet Take 1 tablet (800 mg total) by mouth 3 (three)  times daily. 21 tablet Itzia Cunliffe, Lurena Joiner, PA-C      PDMP not reviewed this encounter.   Kishaun Erekson, Lurena Joiner, [place] 03/25/23 1043

## 2023-03-25 NOTE — ED Triage Notes (Signed)
Pt c/o left knee pain after getting out her truck yesterday. Pt stated she heard a POP.

## 2023-03-25 NOTE — Addendum Note (Signed)
Addended by: Alfonse Spruce on: 03/25/2023 06:31 PM   Modules accepted: Orders

## 2023-03-25 NOTE — Progress Notes (Signed)
Rubin Payor, PhD, LAT, ATC acting as a scribe for Clementeen Graham, MD.  [name] is a 47 y.o. female who presents to [facility] today for L knee pain. Pt was previously seen by Dr. Denyse Amass on 02/25/23 for chronic R hip pain.   Today, pt c/o L knee pain ongoing since yesterday. MOI: Pt got out of her fiance's high truck, started walking, and then started experiencing L knee pain and mechanical symptoms. Pt was seen earlier this morning at [facility]. Pt locates pain to all over the L knee w/ instability noted.   L Knee swelling: unsure Mechanical symptoms: yes Aggravates: walking Treatments tried: ace wrap, already rx pain management meds  Pertinent review of systems: No fevers or chills  Relevant historical information: Asthma.  Recently completed physical therapy for lateral hip pain right successfully.   Exam:  BP (!) 146/94   Pulse 68   Ht 5\' 8"  (1.727 m)   LMP 02/28/2023 (Exact Date)   SpO2 98%   BMI 49.57 kg/m  General: Well Developed, well nourished, and in no acute distress.   MSK: Left knee moderate effusion. Tender palpation medial lateral joint line. Decreased range of motion.  Flexion is limited to 90 degrees. Significant guarding with ligament stability testing nondiagnostic. Significant guarding with McMurray's testing nondiagnostic. Significant antalgic gait.  Patient arrived in a wheelchair and was able to transition to an exam table but with difficulty.    Lab and Radiology Results  Procedure: Real-time Ultrasound Guided Injection of left knee joint superior lateral patellar space Device: Philips Affiniti 50G Images permanently stored and available for review in PACS Verbal informed consent obtained.  Discussed risks and benefits of procedure. Warned about infection, bleeding, hyperglycemia damage to structures among others. Patient expresses understanding and agreement Time-out conducted.   Noted no overlying erythema,  induration, or other signs of local infection.   Skin prepped in a sterile fashion.   Local anesthesia: Topical Ethyl chloride.   With sterile technique and under real time ultrasound guidance: 40 mg of Kenalog and 2 mL Marcaine injected into knee joint. Fluid seen entering the joint capsule.   Completed without difficulty   Pain moderately immediately resolved suggesting accurate placement of the medication.   Advised to call if fevers/chills, erythema, induration, drainage, or persistent bleeding.   Images permanently stored and available for review in the ultrasound unit.  Impression: Technically successful ultrasound guided injection.    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  Mild lateral and medial compartment DJD.  No acute fractures are visible. Await formal radiology review    Assessment and Plan: 47 y.o. female with left knee pain occurring without a traumatic event.  She experienced pain while stepping down from a very tall pickup truck.  Her description of events is consistent with a degenerative meniscus tear.  I am hopeful that the steroid injection will calm it down and allow her to function more normally.  Continue compression using Ace wrap.  Recommend using a cane or crutches. Recheck in 2 weeks.Marland Kitchen   PDMP not reviewed this encounter. Orders Placed This Encounter  Procedures   [place] LIMITED JOINT SPACE STRUCTURES LOW LEFT(NO LINKED CHARGES)    Order Specific Question:   Reason for Exam (SYMPTOM  OR DIAGNOSIS REQUIRED)    Answer:   left knee pain    Order Specific Question:   Preferred imaging location?    Answer:  [facility] Knee AP/LAT W/[place] Left    Standing Status:   Future    Number of Occurrences:   1    Standing Expiration Date:   04/24/2023    Order Specific Question:   Reason for Exam (SYMPTOM  OR DIAGNOSIS REQUIRED)    Answer:   left knee pain     Order Specific Question:   Preferred imaging location?    Answer:   [facility]    Order Specific Question:   Is patient pregnant?    Answer:   No   No orders of the defined types were placed in this encounter.    Discussed warning signs or symptoms. Please see discharge instructions. Patient expresses understanding.   The above documentation has been reviewed and is accurate and complete Clementeen Graham, M.D.

## 2023-03-25 NOTE — Patient Instructions (Signed)
Thank you for coming in today.   Please get an Xray today before you leave   You received an injection today. Seek immediate medical attention if the joint becomes red, extremely painful, or is oozing fluid.   Please go to [facility] supply to get the cane or crutch we talked about today. You may also be able to get it from [employer] [facility].    Check back in 2 weeks

## 2023-03-26 ENCOUNTER — Telehealth: Payer: Self-pay

## 2023-03-26 NOTE — Telephone Encounter (Signed)
Yes please have her come in for this.   Malachi Bonds, MD [facility]

## 2023-03-26 NOTE — Telephone Encounter (Signed)
Patient plans to come into the [place] office today 03/26/23 for blood work. [name] has been notified

## 2023-03-26 NOTE — Telephone Encounter (Signed)
Ige can not be added on to previous labwork. Labs can only be added 7 days after labs have been drawn.  Please advise if you would like patient to come back for bloodwork.

## 2023-03-28 ENCOUNTER — Other Ambulatory Visit: Payer: Self-pay | Admitting: Allergy & Immunology

## 2023-03-31 ENCOUNTER — Encounter: Payer: 59 | Attending: Physical Medicine and Rehabilitation | Admitting: Physical Medicine and Rehabilitation

## 2023-03-31 VITALS — BP 142/89 | HR 65 | Ht 68.0 in | Wt 328.0 lb

## 2023-03-31 DIAGNOSIS — G894 Chronic pain syndrome: Secondary | ICD-10-CM | POA: Diagnosis not present

## 2023-03-31 DIAGNOSIS — M1611 Unilateral primary osteoarthritis, right hip: Secondary | ICD-10-CM | POA: Insufficient documentation

## 2023-03-31 DIAGNOSIS — Z5181 Encounter for therapeutic drug level monitoring: Secondary | ICD-10-CM | POA: Insufficient documentation

## 2023-03-31 DIAGNOSIS — Z79891 Long term (current) use of opiate analgesic: Secondary | ICD-10-CM | POA: Diagnosis not present

## 2023-03-31 DIAGNOSIS — F17200 Nicotine dependence, unspecified, uncomplicated: Secondary | ICD-10-CM | POA: Insufficient documentation

## 2023-03-31 DIAGNOSIS — M5416 Radiculopathy, lumbar region: Secondary | ICD-10-CM | POA: Insufficient documentation

## 2023-03-31 DIAGNOSIS — M25562 Pain in left knee: Secondary | ICD-10-CM | POA: Diagnosis present

## 2023-03-31 DIAGNOSIS — G8929 Other chronic pain: Secondary | ICD-10-CM | POA: Insufficient documentation

## 2023-03-31 LAB — IGE: IgE (Immunoglobulin E), Serum: 404 IU/mL (ref 6–495)

## 2023-03-31 MED ORDER — OXYCODONE-ACETAMINOPHEN 5-325 MG PO TABS: 1.0000 | ORAL_TABLET | Freq: Two times a day (BID) | ORAL | 0 refills | Status: AC | PRN

## 2023-03-31 NOTE — Progress Notes (Signed)
Left knee x-ray shows some mild arthritis

## 2023-03-31 NOTE — Patient Instructions (Addendum)
  Chronic pain syndrome I am increasing your chronic pain regimen from Percocet 1 tablet up to twice daily to 1 to 2 tablets up to twice daily.  I have refilled your medication for the next 3 months.  You can also take tylenol up to 325 mg twice daily in addition to percocet.   I will have you follow up with me or Riley Lam in 3 months  Primary osteoarthritis of right hip Acute pain of left knee -     Cane adjustable narrow base quad  I have given you a script for a cane that can be obtained at any medical supply store. Adjust the cane so it is at hip level and carry it in your right hand for stability when walking with left knee pain. Advance the cane as you step with your left foot.   Use ice and voltaren gel on the left knee, and wrap with an ACE wrap. You can discuss bracing with Dr. Denyse Amass at follow up.   Right lumbar radiculopathy Back pain responded very well to PT; keep up the home exercises!  Morbid obesity (HCC) Tobacco use disorder Switch from candy to sugar free gum to help with oral fixations while trying to quit cigarettes. I'll also message your primary doctor to let her know about this.  w about this.

## 2023-03-31 NOTE — Progress Notes (Unsigned)
Subjective:    Patient ID: [name], female    DOB: 1976/01/04, 47 y.o.   MRN: [id number]  HPI Pain Inventory Average Pain 10 Pain Right Now 10 My pain is sharp and stabbing  In the last 24 hours, has pain interfered with the following? General activity 10 Relation with others 10 Enjoyment of life 10 What TIME of day is your pain at its worst? varies Sleep (in general) Fair  Pain is worse with: walking, bending, sitting, inactivity, and standing Pain improves with: heat/ice Relief from Meds: 7  Family History  Problem Relation Age of Onset  . Diabetes Mother   . Hypertension Mother   . Diabetes Father   . Asthma Other   . Hyperlipidemia Other   . Hypertension Other    Social History   Socioeconomic History  . Marital status: Single    Spouse name: Not on file  . Number of children: Not on file  . Years of education: Not on file  . Highest education level: Not on file  Occupational History  . Not on file  Tobacco Use  . Smoking status: Some Days    Packs/day: .33    Types: Cigarettes  . Smokeless tobacco: Never  Vaping Use  . Vaping Use: Never used  Substance and Sexual Activity  . Alcohol use: Yes    Comment: once a month at a party beer wine or mixed drinks  . Drug use: No  . Sexual activity: Yes    Birth control/protection: None  Other Topics Concern  . Not on file  Social History Narrative  . Not on file   Social Determinants of Health   Financial Resource Strain: Not on file  Food Insecurity: Not on file  Transportation Needs: Not on file  Physical Activity: Not on file  Stress: Not on file  Social Connections: Not on file   Past Surgical History:  Procedure Laterality Date  . CESAREAN SECTION     x2  . TUBAL LIGATION    . WISDOM TOOTH EXTRACTION  2011   Past Surgical History:  Procedure Laterality Date  . CESAREAN SECTION     x2  . TUBAL LIGATION    . WISDOM TOOTH EXTRACTION  2011   Past Medical History:  Diagnosis  Date  . Asthma   . Hydradenitis   . MRSA (methicillin resistant staph aureus) culture positive   . Obesity    BP (!) 142/89   Pulse 65   Ht 5\' 8"  (1.727 m)   Wt (!) 328 lb (148.8 kg)   LMP 02/28/2023 (Exact Date)   SpO2 96%   BMI 49.87 kg/m   Opioid Risk Score:   Fall Risk Score:  `1  Depression screen [facility] 2/9     03/31/2023   10:06 AM 03/21/2023   10:16 AM 03/03/2023    9:31 AM 01/03/2023    9:15 AM 06/22/2021    2:12 PM  Depression screen PHQ 2/9  Decreased Interest 0 0 3 0 3  Down, Depressed, Hopeless 0 0 0 0 3  PHQ - 2 Score 0 0 3 0 6  Altered sleeping  0 3 0 2  Tired, decreased energy  0 0 0 3  Change in appetite  0 3 0 0  Feeling bad or failure about yourself     0 1  Trouble concentrating  0 0 0 0  Moving slowly or fidgety/restless  0 0 0 0  Suicidal thoughts  0 0 0  1  PHQ-9 Score  0 9 0 13      Review of Systems  Musculoskeletal:        B/L knee hip pain  All other systems reviewed and are negative.     Objective:   Physical Exam        Assessment & Plan:

## 2023-04-07 ENCOUNTER — Other Ambulatory Visit: Payer: Self-pay

## 2023-04-07 ENCOUNTER — Telehealth: Payer: Self-pay

## 2023-04-07 MED ORDER — DOXYCYCLINE MONOHYDRATE 100 MG PO CAPS: ORAL_CAPSULE | ORAL | 3 refills | Status: DC

## 2023-04-07 NOTE — Telephone Encounter (Signed)
Doxycycline Monohydrate 100 mg take  PO qd with food and bid with flares.   Sent to [facility]

## 2023-04-07 NOTE — Telephone Encounter (Signed)
Patient called to let Dr Kathie Rhodes know she stopped Minocycline tablets a few days ago she was taking for HS because she noticed the white area in her eye was turning brown from Minocycline, she would like to know is there anything else you can prescribe

## 2023-04-08 ENCOUNTER — Encounter: Payer: Self-pay | Admitting: Family Medicine

## 2023-04-08 ENCOUNTER — Ambulatory Visit (INDEPENDENT_AMBULATORY_CARE_PROVIDER_SITE_OTHER): Payer: 59 | Admitting: Family Medicine

## 2023-04-08 VITALS — BP 136/84 | HR 69 | Ht 68.0 in | Wt 329.4 lb

## 2023-04-08 DIAGNOSIS — M25562 Pain in left knee: Secondary | ICD-10-CM | POA: Diagnosis not present

## 2023-04-08 NOTE — Progress Notes (Signed)
   I, Stevenson Clinch, CMA acting as a scribe for Clementeen Graham, MD.  [name] is a 47 y.o. female who presents to [facility] at [facility] today for 2-wk f/u L knee pain. Pt was last seen by Dr. Denyse Amass on 03/25/23 and was given a L knee steroid injection, advised to cont compression w/ ace wrap, and use cane or crutches. Today, pt reports no change in sx s/p knee injection. Ambulating with a cane today. ACE compression wrap on today. Continues to have popping and locking in the knee. Has concerns that MSK pain is related to antibiotic Minocycline, spoke with a nurse on Saturday, she has since discontinued the medication and has noticed some improvement of MSK sx.   Dx imaging: 03/25/23 L knee XR  Pertinent review of systems: No fevers or chills  Relevant historical information: Hidradenitis suppurativa intolerant to minocycline causing hyperpigmentation.   Exam:  BP 136/84   Pulse 69   Ht 5\' 8"  (1.727 m)   Wt (!) 329 lb 6.4 oz (149.4 kg)   LMP 02/28/2023 (Exact Date)   SpO2 98%   BMI 50.09 kg/m  General: Well Developed, well nourished, and in no acute distress.   MSK: Left knee: Wearing Ace wrap.  Decreased range of motion.  Clicking and popping palpated with extension and flexion. Stable ligamentous exam. Positive McMurray's test. Antalgic gait    Lab and Radiology Results  EXAM: LEFT KNEE 3 VIEWS   COMPARISON:  None Available.   FINDINGS: Mild tricompartmental osteoarthritic changes present. No acute fracture or dislocation. No evidence of joint effusion.   IMPRESSION: 1. No acute findings. 2. Mild osteoarthritis.     Electronically Signed   By: Elberta Fortis M.D.   On: 03/28/2023 16:15 I, Clementeen Graham, personally (independently) visualized and performed the interpretation of the images attached in this note.     Assessment and Plan: 47 y.o. female with left knee pain.  She experienced the onset of this pain when she stepped down from a  tall pickup truck and felt a pop.  She is having more pain and mechanical symptoms than I would expect based on her mild arthritis picture on her recent x-ray.  She had little to minimal benefit from a steroid injection.  At this point neck step should be MRI of the knee to evaluate source of pain more accurately and for potential surgical planning.  I am concerned about a meniscus tear or OCD lesion.   PDMP not reviewed this encounter. Orders Placed This Encounter  Procedures   MR Knee Left  Wo Contrast    Standing Status:   Future    Standing Expiration Date:   04/07/2024    Order Specific Question:   What is the patient's sedation requirement?    Answer:   No Sedation    Order Specific Question:   Does the patient have a pacemaker or implanted devices?    Answer:   No    Order Specific Question:   Preferred imaging location?    Answer:   [facility] (table limit-350lbs)   No orders of the defined types were placed in this encounter.    Discussed warning signs or symptoms. Please see discharge instructions. Patient expresses understanding.   The above documentation has been reviewed and is accurate and complete Clementeen Graham, M.D.

## 2023-04-08 NOTE — Patient Instructions (Signed)
Thank you for coming in today.   You should hear from MRI scheduling within 1 week. If you do not hear please let me know.    Let me know how this goes.   We can do more once we know what the problem is.

## 2023-04-14 ENCOUNTER — Other Ambulatory Visit: Payer: Self-pay | Admitting: Family

## 2023-04-20 ENCOUNTER — Ambulatory Visit (INDEPENDENT_AMBULATORY_CARE_PROVIDER_SITE_OTHER): Payer: 59

## 2023-04-20 ENCOUNTER — Other Ambulatory Visit: Payer: Self-pay | Admitting: Allergy & Immunology

## 2023-04-20 DIAGNOSIS — M25562 Pain in left knee: Secondary | ICD-10-CM | POA: Diagnosis not present

## 2023-04-25 ENCOUNTER — Other Ambulatory Visit (HOSPITAL_COMMUNITY): Payer: Self-pay

## 2023-04-25 ENCOUNTER — Other Ambulatory Visit: Payer: Self-pay | Admitting: Dermatology

## 2023-04-25 ENCOUNTER — Ambulatory Visit (INDEPENDENT_AMBULATORY_CARE_PROVIDER_SITE_OTHER): Payer: 59 | Admitting: Nurse Practitioner

## 2023-04-25 ENCOUNTER — Encounter: Payer: Self-pay | Admitting: Nurse Practitioner

## 2023-04-25 VITALS — BP 138/82 | HR 76 | Temp 97.7°F | Ht 68.0 in

## 2023-04-25 DIAGNOSIS — L732 Hidradenitis suppurativa: Secondary | ICD-10-CM | POA: Diagnosis not present

## 2023-04-25 DIAGNOSIS — Z6841 Body Mass Index (BMI) 40.0 and over, adult: Secondary | ICD-10-CM

## 2023-04-25 DIAGNOSIS — N939 Abnormal uterine and vaginal bleeding, unspecified: Secondary | ICD-10-CM | POA: Diagnosis not present

## 2023-04-25 LAB — CBC
HCT: 39.7 % (ref 36.0–46.0)
Hemoglobin: 12.4 g/dL (ref 12.0–15.0)
MCHC: 31.1 g/dL (ref 30.0–36.0)
MCV: 86 fl (ref 78.0–100.0)
Platelets: 203 10*3/uL (ref 150.0–400.0)
RBC: 4.61 Mil/uL (ref 3.87–5.11)
RDW: 17.4 % — ABNORMAL HIGH (ref 11.5–15.5)
WBC: 13 10*3/uL — ABNORMAL HIGH (ref 4.0–10.5)

## 2023-04-25 LAB — IRON: Iron: 32 ug/dL — ABNORMAL LOW (ref 42–145)

## 2023-04-25 LAB — FERRITIN: Ferritin: 24.2 ng/mL (ref 10.0–291.0)

## 2023-04-25 MED ORDER — WEGOVY 0.25 MG/0.5ML ~~LOC~~ SOAJ
0.2500 mg | SUBCUTANEOUS | 1 refills | Status: DC
Start: 2023-04-25 — End: 2023-05-09
  Filled 2023-04-25 – 2023-04-28 (×2): qty 2, 28d supply, fill #0

## 2023-04-25 NOTE — Progress Notes (Signed)
Established Patient Office Visit  Subjective   Patient ID: [name], female    DOB: Jun 08, 1976  Age: 47 y.o. MRN: [id number]  Chief Complaint  Patient presents with   Obesity    Obesity: Current BMI 50, current weight 329 pounds.  Patient tried to lose weight so she can be candidate for surgery regarding her osteoarthritis of knee.  Was planning on undergoing bariatric surgery but would now like to discuss pharmacological treatment options as well.  Denies any personal or family history of thyroid cancer.  Denies personal history of gallstones or pancreatitis.  Has had tubal ligation for contraception.  Abnormal uterine bleeding: Has been bleeding vaginally since 04/03/2023, reports known history of uterine fibroid.  Has appoint with OB/GYN coming up in approximately 2 weeks wants to see if she can be seen by another OB/GYN sooner if possible.  She has chronic iron deficiency anemia, currently on ferrous sulfate 325 mg by mouth twice a day.    ROS: see hpi    Objective:     BP 138/82   Pulse 76   Temp 97.7 F (36.5 C) (Temporal)   Ht 5\' 8"  (1.727 m)   SpO2 98%   BMI 50.09 kg/m  BP Readings from Last 3 Encounters:  04/25/23 138/82  04/08/23 136/84  03/31/23 (!) 142/89   Wt Readings from Last 3 Encounters:  04/08/23 (!) 329 lb 6.4 oz (149.4 kg)  03/31/23 (!) 328 lb (148.8 kg)  03/24/23 (!) 326 lb (147.9 kg)      Physical Exam Vitals reviewed.  Constitutional:      General: She is not in acute distress.    Appearance: Normal appearance.  HENT:     Head: Normocephalic and atraumatic.  Neck:     Vascular: No carotid bruit.  Cardiovascular:     Rate and Rhythm: Normal rate and regular rhythm.     Pulses: Normal pulses.     Heart sounds: Normal heart sounds.  Pulmonary:     Effort: Pulmonary effort is normal.     Breath sounds: Normal breath sounds.  Skin:    General: Skin is warm and dry.     Comments: Continues to have multiple lesions to each breast,  lesions do appear smaller compared to last office visit and are draining less.  Neurological:     General: No focal deficit present.     Mental Status: She is alert and oriented to person, place, and time.  Psychiatric:        Mood and Affect: Mood normal.        Behavior: Behavior normal.        Judgment: Judgment normal.      No results found for any visits on 04/25/23.    The 10-year ASCVD risk score (Arnett DK, et al., 2019) is: 3.2%    Assessment & Plan:   Problem List Items Addressed This Visit       Musculoskeletal and Integument   Hidradenitis suppurativa    Chronic Lesions on chest are still present, but seem to be improving and draining a bit less. She continues to plan on following up with dermatology as scheduled.        Genitourinary   Abnormal uterine bleeding - Primary    Will check CBC, iron, and ferritin level for further evaluation.  Patient encouraged to follow-up with OB/GYN as scheduled, will order referral to a different OB/GYN office to see if she get an sooner for second opinion.  Relevant Orders   Ambulatory referral to Obstetrics / Gynecology   CBC   Iron   Ferritin     Other   Class 3 severe obesity due to excess calories with body mass index (BMI) of 45.0 to 49.9 in adult [facility])    Chronic Pressure decision-making patient would like to try GLP-1 agonist. Patient educated on black box warning as well as potential for severe side effects, what to look out for, and what to do if the side effects occur. Start Wegovy 0.25 weekly injection, follow-up in 4 to 6 weeks      Relevant Medications   Semaglutide-Weight Management (WEGOVY) 0.25 MG/0.5ML SOAJ    Return in about 6 weeks (around 06/06/2023) for 4-6 weeks F/U with Maralyn Sago.    Elenore Paddy, NP

## 2023-04-25 NOTE — Assessment & Plan Note (Signed)
Chronic Lesions on chest are still present, but seem to be improving and draining a bit less. She continues to plan on following up with dermatology as scheduled.

## 2023-04-25 NOTE — Assessment & Plan Note (Signed)
Will check CBC, iron, and ferritin level for further evaluation.  Patient encouraged to follow-up with OB/GYN as scheduled, will order referral to a different OB/GYN office to see if she get an sooner for second opinion.

## 2023-04-25 NOTE — Patient Instructions (Signed)
Wegovy ZepBound Saxenda 

## 2023-04-25 NOTE — Assessment & Plan Note (Signed)
Chronic Pressure decision-making patient would like to try GLP-1 agonist. Patient educated on black box warning as well as potential for severe side effects, what to look out for, and what to do if the side effects occur. Start Wegovy 0.25 weekly injection, follow-up in 4 to 6 weeks

## 2023-04-26 ENCOUNTER — Other Ambulatory Visit (HOSPITAL_COMMUNITY): Payer: Self-pay

## 2023-04-28 ENCOUNTER — Other Ambulatory Visit (HOSPITAL_COMMUNITY): Payer: Self-pay

## 2023-04-29 ENCOUNTER — Ambulatory Visit (INDEPENDENT_AMBULATORY_CARE_PROVIDER_SITE_OTHER): Payer: 59 | Admitting: Family Medicine

## 2023-04-29 ENCOUNTER — Encounter: Payer: Self-pay | Admitting: Family Medicine

## 2023-04-29 ENCOUNTER — Telehealth: Payer: Self-pay

## 2023-04-29 VITALS — BP 124/80 | HR 78 | Ht 68.0 in | Wt 323.2 lb

## 2023-04-29 DIAGNOSIS — M25562 Pain in left knee: Secondary | ICD-10-CM | POA: Diagnosis not present

## 2023-04-29 DIAGNOSIS — G8929 Other chronic pain: Secondary | ICD-10-CM

## 2023-04-29 DIAGNOSIS — M1712 Unilateral primary osteoarthritis, left knee: Secondary | ICD-10-CM | POA: Diagnosis not present

## 2023-04-29 NOTE — Telephone Encounter (Signed)
VOB initiated for Zilretta for LEFT knee OA 

## 2023-04-29 NOTE — Telephone Encounter (Signed)
VOB initiated for GELSYN for LEFT knee OA  Also checking Zilretta

## 2023-04-29 NOTE — Telephone Encounter (Signed)
Rodolph Bong, MD  Dierdre Searles, CMA Please auth zilretta and gel shot left knee. Zilretta preferred.

## 2023-04-29 NOTE — Progress Notes (Signed)
I, Stevenson Clinch, CMA acting as a Neurosurgeon for Clementeen Graham, MD.  [name] is a 47 y.o. female who presents to [facility] at [facility] today for f/u R hip and L knee pain w/ knee MRI review. Pt was last seen by Dr. Denyse Amass for her R hip on 02/25/23 and she was referred to bariatric surgery and was counseled on tobacco cessation using nicotine gum. Her more recent visit w/ Dr. Denyse Amass on 04/08/23 for her L knee and a MRI was ordered.   Today, pt presents for MRI review of the left knee. Pt reports no change in knee sx; the knee has not improved, but also has not worsened. Ambulating with a cane today.   Dx imaging: 04/20/23 L knee MRI 03/25/23 L knee XR  01/21/23 R hip MRI  Pertinent review of systems: No fevers or chills  Relevant historical information: Advanced right hip arthritis   Exam:  BP 124/80   Pulse 78   Ht 5\' 8"  (1.727 m)   Wt (!) 323 lb 3.2 oz (146.6 kg)   SpO2 98%   BMI 49.14 kg/m  General: Well Developed, well nourished, and in no acute distress.   MSK: Left knee: Mild effusion otherwise normal.  Normal motion.    Lab and Radiology Results   EXAM: MRI OF THE LEFT KNEE WITHOUT CONTRAST   TECHNIQUE: Multiplanar, multisequence MR imaging of the knee was performed. No intravenous contrast was administered.   COMPARISON:  Plain films left knee 03/25/2023.   FINDINGS: MENISCI   Medial meniscus: Intact. There is some fraying along the free edge of the posterior horn and body   Lateral meniscus: The anterior horn is not visualized consistent with degenerative maceration. Horizontal tear in the body reaches the meniscal undersurface.   LIGAMENTS   Cruciates:  Intact.   Collaterals:  Intact.   CARTILAGE   Patellofemoral:  Markedly thinned throughout.   Medial:  Frayed and irregular without focal defect.   Lateral: Moderate to severe thinning is worst along the tibial plateau.   Joint:  Small joint effusion.   Popliteal  Fossa:  Small Baker's cyst.   Extensor Mechanism:  Intact.   Bones: No fracture, stress change or worrisome lesion. There is osteophytosis about the knee. Also seen is some subchondral edema about the lateral compartment.   Other: None.   IMPRESSION: 1. Degenerative maceration anterior horn lateral meniscus. A horizontal tear in the body of the lateral meniscus reaches the meniscal undersurface. 2. Moderate to severe osteoarthritis about the knee is worst in the lateral compartment. 3. Small joint effusion and Baker's cyst.     Electronically Signed   By: Drusilla Kanner M.D.   On: 04/29/2023 07:59   I, Clementeen Graham, personally (independently) visualized and performed the interpretation of the images attached in this note.    Assessment and Plan: 47 y.o. female with left knee pain.  Dominant source of pain thought to be lateral compartment DJD.  She does have a meniscus tear that could be contributory.  She does have some mechanical symptoms but her main problem is that her knee hurts.  She had a steroid injection which did not last very long.  Work on authorization now for [employer] and hyaluronic acid injection.  Hopefully this will help.  She may require knee replacement but her BMI is in excess of 40 so she will have to wait a little bit and keep working on weight loss.  Discussed warning signs or symptoms. Please see discharge instructions. Patient expresses understanding.   The above documentation has been reviewed and is accurate and complete Clementeen Graham, M.D.

## 2023-04-29 NOTE — Telephone Encounter (Signed)
  Rodolph Bong, MD  Dierdre Searles, CMA Please auth zilretta and gel shot left knee. Zilretta preferred.

## 2023-04-29 NOTE — Patient Instructions (Signed)
Thank you for coming in today.   We will work on authorization for gel shots and zilretta.   I expect will see each other soon for that.

## 2023-04-29 NOTE — Progress Notes (Signed)
As we discussed in clinic your major issue is knee arthritis.

## 2023-04-30 ENCOUNTER — Encounter (HOSPITAL_COMMUNITY): Payer: Self-pay

## 2023-04-30 ENCOUNTER — Other Ambulatory Visit: Payer: Self-pay

## 2023-04-30 ENCOUNTER — Emergency Department (HOSPITAL_COMMUNITY)
Admission: EM | Admit: 2023-04-30 | Discharge: 2023-04-30 | Disposition: A | Discharge status: Home / Self Care | Payer: 59 | Attending: Emergency Medicine | Admitting: Emergency Medicine

## 2023-04-30 DIAGNOSIS — D72829 Elevated white blood cell count, unspecified: Secondary | ICD-10-CM | POA: Insufficient documentation

## 2023-04-30 DIAGNOSIS — J45909 Unspecified asthma, uncomplicated: Secondary | ICD-10-CM | POA: Diagnosis not present

## 2023-04-30 DIAGNOSIS — N939 Abnormal uterine and vaginal bleeding, unspecified: Secondary | ICD-10-CM

## 2023-04-30 DIAGNOSIS — R531 Weakness: Secondary | ICD-10-CM | POA: Diagnosis not present

## 2023-04-30 DIAGNOSIS — R5383 Other fatigue: Secondary | ICD-10-CM | POA: Diagnosis not present

## 2023-04-30 DIAGNOSIS — Z7951 Long term (current) use of inhaled steroids: Secondary | ICD-10-CM | POA: Diagnosis not present

## 2023-04-30 LAB — CBC
HCT: 40.6 % (ref 36.0–46.0)
Hemoglobin: 12.2 g/dL (ref 12.0–15.0)
MCH: 27.4 pg (ref 26.0–34.0)
MCHC: 30 g/dL (ref 30.0–36.0)
MCV: 91 fL (ref 80.0–100.0)
Platelets: 222 10*3/uL (ref 150–400)
RBC: 4.46 MIL/uL (ref 3.87–5.11)
RDW: 17.4 % — ABNORMAL HIGH (ref 11.5–15.5)
WBC: 14.8 10*3/uL — ABNORMAL HIGH (ref 4.0–10.5)
nRBC: 0 % (ref 0.0–0.2)

## 2023-04-30 LAB — HCG, QUANTITATIVE, PREGNANCY: hCG, Beta Chain, Quant, S: 1 m[IU]/mL (ref <5)

## 2023-04-30 LAB — I-STAT BETA HCG BLOOD, ED (MC, WL, AP ONLY): I-stat hCG, quantitative: 6.6 m[IU]/mL — ABNORMAL HIGH (ref <5)

## 2023-04-30 MED ORDER — MEDROXYPROGESTERONE ACETATE 10 MG PO TABS: 10.0000 mg | ORAL_TABLET | Freq: Every day | ORAL | 0 refills | Status: DC

## 2023-04-30 NOTE — Telephone Encounter (Signed)
Zilretta for LEFT knee OA (Also checking Gelsyn)  Primary Insurance: [facility] HMO Co-Pay: n/a C-Insurance: 20% Deductible: does not apply Prior Auth: NOT required  Secondary Insurance: Medicaid  Member's responsibility left over by Medicare can be billed to Medicaid (if eligible).    03/25/23 - Kenalog L knee

## 2023-04-30 NOTE — Telephone Encounter (Signed)
Gelsyn for LEFT knee OA (Also checking Zilretta)  Primary Insurance: [employer] Dual [employer] HMO Co-Pay: n/a C-Insurance: 20% Deductible: does not apply Prior Auth: NOT required   Secondary Insurance: Medicaid  Member's responsibility left over by Medicare can be billed to Medicaid (if eligible).      03/25/23 - Kenalog L knee

## 2023-04-30 NOTE — ED Triage Notes (Signed)
Pt to the ed from home with a CC of vaginal bleeding x 1 month. Pt was dxed with a fibroid a few months ago but was able to control the bleeding until last month when she started her period on the 9th and has not stopped bleeding since. Pt relays dizziness, sob, pt relay she will bleeding through a pad in an hour. Pt had a appt scheduled with her GYN but is unable to be seen before the 19.

## 2023-04-30 NOTE — ED Notes (Signed)
Pt reports hx of anemia. Onset of bleeding May 9th, pt reports passing ping pong ball size clots about once daily. Pt reports she takes iron. Pt reports dizziness, asthma exacerbation since onset, dizziness, fatigue.

## 2023-04-30 NOTE — ED Notes (Signed)
Reviewed discharge instructions with patient. Follow-up care and medications reviewed. Patient  verbalized understanding. Patient A&Ox4, VSS, and ambulatory with steady gait upon discharge.  °

## 2023-04-30 NOTE — Discharge Instructions (Addendum)
Lab work still looks okay today.  You were given a prescription for Provera which may make your bleeding heavier at first but then it will hopefully stop it.  If bleeding gets worse in the meantime return to the emergency room or talk to your regular doctor.

## 2023-04-30 NOTE — ED Provider Notes (Signed)
[place] EMERGENCY DEPARTMENT AT [facility] Provider Note   CSN: [id number] Arrival date & time: 04/30/23  4098     History  Chief Complaint  Patient presents with   Vaginal Bleeding    [name] is a 47 y.o. female.  Patient is a 47 year old female with a history of asthma, hidradenitis presenting today for persistent vaginal bleeding.  Patient reports that in April she had a very heavy period which lasted for weeks.  She had an ultrasound that showed a fibroid but states a few days after she was seen in the emergency room the bleeding stopped she followed up with OB/GYN and they told her to just keep an eye on things and she did not need any intervention at that time.  However her menses started again on May 9 and since then she has had persistent vaginal bleeding.  The first few weeks she was having to wear a tampon and a pad and she was still bleeding fairly significantly changing them every hour.  She reports in the last week she is currently wearing 2 pads and changing them every hour.  They will over saturated if she stands and moves around.  She is having intermittent cramping.  She has called her PCP and the OB/GYN.  Her appointment for OB is on 19 June and she is called them multiple times asking if there is a medication she can have to help with the bleeding.  They told her she can go to the emergency room.  Her PCP tried to get her an earlier appointment but also did not prescribe her any medication for the bleeding.  She does not take any anticoagulants or have a history of bleeding disorder.  Patient reports also in the last week or so she has felt tired, weak, lightheaded.  No chest pain or shortness of breath.  The history is provided by the patient and medical records.  Vaginal Bleeding      Home Medications Prior to Admission medications   Medication Sig Start Date End Date Taking? Authorizing Provider  medroxyPROGESTERone (PROVERA) 10 MG tablet Take  1 tablet (10 mg total) by mouth daily. 04/30/23  Yes Gwyneth Sprout, MD  albuterol [facility] HFA) 108 (90 Base) MCG/ACT inhaler Inhale 4 puffs every 4-6 hours as needed 05/01/21   Alfonse Spruce, MD  albuterol (PROVENTIL) (2.5 MG/3ML) 0.083% nebulizer solution Take 3 mLs (2.5 mg total) by nebulization every 4 (four) hours as needed for wheezing or shortness of breath. 02/13/23   Alfonse Spruce, MD  albuterol (VENTOLIN HFA) 108 (90 Base) MCG/ACT inhaler Inhale 2 puffs by mouth every 4 hours as needed for wheezing or shortness of breath 01/24/23   Nehemiah Settle, FNP  amitriptyline (ELAVIL) 25 MG tablet Take 25 mg by mouth at bedtime. 05/22/22   [provider]  Azelastine HCl 137 MCG/SPRAY SOLN Use 2 sprays into each nostril twice daily 03/20/23   Alfonse Spruce, MD  clindamycin (CLEOCIN T) 1 % external solution Apply to open sores QD after shower. 01/08/23   Willeen Niece, MD  cromolyn (OPTICROM) 4 % ophthalmic solution Place 1 drop into both eyes 4 (four) times daily. 03/20/23   Alfonse Spruce, MD  dorzolamide-timolol (COSOPT) 2-0.5 % ophthalmic solution 1 drop 2 (two) times daily.    [provider]  doxycycline (MONODOX) 100 MG capsule Take PO qd with food and bid with flares. 04/07/23   Willeen Niece, MD  EPINEPHRINE 0.3 mg/0.3 mL IJ  SOAJ injection Inject 0.32ml subcutaneously as needed for anaphylaxis 02/10/23   Nehemiah Settle, FNP  ergocalciferol (VITAMIN D2) 1.25 MG (50000 UT) capsule Take 50,000 Units by mouth once a week.    [provider]  esomeprazole (NEXIUM) 40 MG capsule Take 1 capsule by mouth twice daily 11/20/22   Alfonse Spruce, MD  ferrous sulfate 325 (65 FE) MG EC tablet Take 325 mg by mouth in the morning and at bedtime.    [provider]  fluconazole (DIFLUCAN) 150 MG tablet Take 1 tablet by mouth every day 04/28/23   Willeen Niece, MD  fluticasone [facility]) 50 MCG/ACT nasal spray Use 1 spray into each nostril every day  03/28/23   Alfonse Spruce, MD  ibuprofen (ADVIL) 800 MG tablet Take 1 tablet (800 mg total) by mouth 3 (three) times daily. 03/25/23   Rising, Lurena Joiner, PA-C  levocetirizine (XYZAL) 5 MG tablet Take 1 tablet by mouth every day as needed for allergies 03/20/23   Alfonse Spruce, MD  minocycline (MINOCIN) 100 MG capsule Take 1-2 capsules by mouth daily. 02/05/23   Willeen Niece, MD  montelukast (SINGULAIR) 10 MG tablet TAKE 1 TABLET BY MOUTH AT  BEDTIME 04/15/23   Alfonse Spruce, MD  oxyCODONE-acetaminophen (PERCOCET) 5-325 MG tablet Take 1-2 tablets by mouth 2 (two) times daily as needed for severe pain. 04/30/23 05/30/23  Angelina Sheriff, DO  oxyCODONE-acetaminophen (PERCOCET) 5-325 MG tablet Take 1-2 tablets by mouth 2 (two) times daily as needed for severe pain. 05/30/23 06/29/23  Angelina Sheriff, DO  oxyCODONE-acetaminophen (PERCOCET/ROXICET) 5-325 MG tablet Take 1-2 tablets by mouth 2 (two) times daily as needed for severe pain. 03/31/23 04/30/23  Elijah Birk C, DO  Secukinumab (COSENTYX UNOREADY) 300 MG/2ML SOAJ Inject 1 Pen into the skin every 28 (twenty-eight) days. 01/08/23   Willeen Niece, MD  Semaglutide-Weight Management [facility]) 0.25 MG/0.5ML SOAJ Inject 0.25 mg into the skin once a week. 04/25/23   Elenore Paddy, NP  Spacer/Aero-Holding Chambers (AEROCHAMBER PLUS) inhaler Use as instructed 08/05/17   Domenick Gong, MD  Dwyane Luo 200-62.5-25 MCG/ACT AEPB USE 1 INHALATION BY MOUTH DAILY 04/23/23   Alfonse Spruce, MD      Allergies    Shellfish allergy and Minocycline    Review of Systems   Review of Systems  Genitourinary:  Positive for vaginal bleeding.    Physical Exam Updated Vital Signs BP 139/86 (BP Location: Right Arm)   Pulse 77   Temp 98.5 F (36.9 C) (Oral)   Resp 16   Ht 5\' 8"  (1.727 m)   Wt (!) 146 kg   SpO2 97%   BMI 48.94 kg/m  Physical Exam Vitals and nursing note reviewed. Exam conducted with a chaperone present.  Constitutional:       General: She is not in acute distress.    Appearance: She is well-developed.  HENT:     Head: Normocephalic and atraumatic.  Eyes:     Pupils: Pupils are equal, round, and reactive to light.  Cardiovascular:     Rate and Rhythm: Normal rate and regular rhythm.     Heart sounds: Normal heart sounds. No murmur heard.    No friction rub.  Pulmonary:     Effort: Pulmonary effort is normal.     Breath sounds: Normal breath sounds. No wheezing or rales.  Abdominal:     General: Bowel sounds are normal. There is no distension.     Palpations: Abdomen is soft.  Tenderness: There is no abdominal tenderness. There is no guarding or rebound.  Genitourinary:    Vagina: Bleeding present.     Cervix: Cervical bleeding present.     Uterus: Not tender.      Adnexa: Right adnexa normal and left adnexa normal.  Musculoskeletal:        General: No tenderness. Normal range of motion.     Comments: No edema  Skin:    General: Skin is warm and dry.     Findings: No rash.  Neurological:     Mental Status: She is alert and oriented to person, place, and time. Mental status is at baseline.     Cranial Nerves: No cranial nerve deficit.  Psychiatric:        Mood and Affect: Mood normal.        Behavior: Behavior normal.     ED Results / Procedures / Treatments   Labs (all labs ordered are listed, but only abnormal results are displayed) Labs Reviewed  CBC - Abnormal; Notable for the following components:      Result Value   WBC 14.8 (*)    RDW 17.4 (*)    All other components within normal limits  I-STAT BETA HCG BLOOD, ED ([facility], [facility], [facility] ONLY) - Abnormal; Notable for the following components:   I-stat hCG, quantitative 6.6 (*)    All other components within normal limits  HCG, QUANTITATIVE, PREGNANCY    EKG None  Radiology No results found.  Procedures Procedures    Medications Ordered in ED Medications - No data to display  ED Course/ Medical Decision Making/ A&P                              Medical Decision Making Amount and/or Complexity of Data Reviewed Labs: ordered. Decision-making details documented in ED Course.   Pt with multiple medical problems and comorbidities and presenting today with a complaint that caries a high risk for morbidity and mortality.  Here today with persistent vaginal bleeding.  Patient is having symptoms of weakness and fatigue she has vaginal bleeding.  Patient has had heavy periods in the last few months.  Question whether she is going through early premenopausal symptoms.  She has had a tubal ligation and was last sexually active over a month ago and low suspicion for pregnancy.  Patient is not complaining of symptoms suggestive of STI.  Will recheck hemoglobin to ensure no significant anemia she is taking iron twice daily.   I independently interpreted patient's labs and point-of-care hCG was elevated at 6.6 however unlikely that patient is pregnant and quantitative hCG is negative at 1, CBC with mild leukocytosis of 14 of unknown cause but stable hemoglobin of 12.  Will treat patient with Provera and have her follow-up on the 19th as planned with OB/GYN.  She is comfortable with this plan we will continue her iron as well and return for any worsening symptoms.         Final Clinical Impression(s) / ED Diagnoses Final diagnoses:  Abnormal uterine bleeding (AUB)    Rx / DC Orders ED Discharge Orders          Ordered    medroxyPROGESTERone (PROVERA) 10 MG tablet  Daily        04/30/23 1035              Gwyneth Sprout, MD 04/30/23 1036

## 2023-05-01 ENCOUNTER — Telehealth: Payer: Self-pay

## 2023-05-01 NOTE — Transitions of Care (Post Inpatient/ED Visit) (Signed)
05/01/2023  Name: [name] MRN: [id number] DOB: [birth year]  Today's TOC FU Call Status: Today's TOC FU Call Status:: Successful TOC FU Call Competed TOC FU Call Complete Date: 05/01/23  Transition Care Management Follow-up Telephone Call Date of Discharge: 04/30/23 Discharge Facility: [facility]) Type of Discharge: Emergency Department Reason for ED Visit: Other: (abnormal uterine bleeding) How have you been since you were released from the hospital?: Same Any questions or concerns?: No  Items Reviewed: Did you receive and understand the discharge instructions provided?: Yes Medications obtained,verified, and reconciled?: Yes (Medications Reviewed) Any new allergies since your discharge?: No Dietary orders reviewed?: NA Do you have support at home?: Yes People in Home: child(ren), adult  Medications Reviewed Today: Medications Reviewed Today     Reviewed by Karena Addison, LPN (Licensed Practical Nurse) on 05/01/23 at 1259  Med List Status: <None>   Medication Order Taking? Sig Documenting Provider Last Dose Status Informant  albuterol (PROAIR HFA) 108 (90 Base) MCG/ACT inhaler [id number] No Inhale 4 puffs every 4-6 hours as needed Alfonse Spruce, MD Taking Active   albuterol (PROVENTIL) (2.5 MG/3ML) 0.083% nebulizer solution [id number] No Take 3 mLs (2.5 mg total) by nebulization every 4 (four) hours as needed for wheezing or shortness of breath. Alfonse Spruce, MD Taking Active   albuterol (VENTOLIN HFA) 108 [phone] Base) MCG/ACT inhaler [id number] No Inhale 2 puffs by mouth every 4 hours as needed for wheezing or shortness of breath Nehemiah Settle, FNP Taking Active   amitriptyline (ELAVIL) 25 MG tablet [id number] No Take 25 mg by mouth at bedtime. [provider] Taking Active   Azelastine HCl 137 MCG/SPRAY SOLN [id number] No Use 2 sprays into each nostril twice daily Alfonse Spruce, MD Taking Active   clindamycin (CLEOCIN T) 1 % external  solution [id number] No Apply to open sores QD after shower. Willeen Niece, MD Taking Active   cromolyn (OPTICROM) 4 % ophthalmic solution [id number] No Place 1 drop into both eyes 4 (four) times daily. Alfonse Spruce, MD Taking Active   dorzolamide-timolol (COSOPT) 2-0.5 % ophthalmic solution [id number] No 1 drop 2 (two) times daily. [provider] Taking Active   doxycycline (MONODOX) 100 MG capsule [id number] No Take PO qd with food and bid with flares. Willeen Niece, MD Taking Active   EPINEPHRINE 0.3 mg/0.3 mL IJ SOAJ injection [id number] No Inject 0.41ml subcutaneously as needed for anaphylaxis Nehemiah Settle, FNP Taking Active   ergocalciferol (VITAMIN D2) 1.25 MG (50000 UT) capsule [id number] No Take 50,000 Units by mouth once a week. [provider] Taking Active   esomeprazole (NEXIUM) 40 MG capsule [id number] No Take 1 capsule by mouth twice daily Alfonse Spruce, MD Taking Active   ferrous sulfate 325 (65 FE) MG EC tablet [id number] No Take 325 mg by mouth in the morning and at bedtime. [provider] Taking Active   fluconazole (DIFLUCAN) 150 MG tablet [id number] No Take 1 tablet by mouth every day Willeen Niece, MD Taking Active   fluticasone [facility]) 50 MCG/ACT nasal spray [id number] No Use 1 spray into each nostril every day Alfonse Spruce, MD Taking Active   ibuprofen (ADVIL) 800 MG tablet [id number] No Take 1 tablet (800 mg total) by mouth 3 (three) times daily. Rising, Lurena Joiner, PA-C Taking Active   levocetirizine (XYZAL) 5 MG tablet [id number] No Take 1 tablet by mouth every day as needed for allergies Alfonse Spruce, MD Taking Active   medroxyPROGESTERone (PROVERA) 10 MG tablet [id number]  Take  1 tablet (10 mg total) by mouth daily. Gwyneth Sprout, MD  Active   minocycline (MINOCIN) 100 MG capsule [id number] No Take 1-2 capsules by mouth daily. Willeen Niece, MD Taking Active   montelukast (SINGULAIR) 10 MG tablet [id number] No TAKE 1  TABLET BY MOUTH AT  BEDTIME Alfonse Spruce, MD Taking Active   oxyCODONE-acetaminophen (PERCOCET) 5-325 MG tablet [id number] No Take 1-2 tablets by mouth 2 (two) times daily as needed for severe pain. Angelina Sheriff, DO Taking Active   oxyCODONE-acetaminophen (PERCOCET) 5-325 MG tablet [id number] No Take 1-2 tablets by mouth 2 (two) times daily as needed for severe pain. Angelina Sheriff, DO Taking Active   Secukinumab (COSENTYX UNOREADY) 300 MG/2ML Ivory Broad [id number] No Inject 1 Pen into the skin every 28 (twenty-eight) days. Willeen Niece, MD Taking Active   Semaglutide-Weight Management [facility]) 0.25 MG/0.5ML Ivory Broad [id number] No Inject 0.25 mg into the skin once a week. Elenore Paddy, NP Taking Active   Spacer/Aero-Holding Chambers (AEROCHAMBER PLUS) inhaler [id number] No Use as instructed Domenick Gong, MD Taking Active Self  Dwyane Luo 200-62.5-25 MCG/ACT AEPB [id number] No USE 1 INHALATION BY MOUTH DAILY Alfonse Spruce, MD Taking Active             Home Care and Equipment/Supplies: Were Home Health Services Ordered?: NA Any new equipment or medical supplies ordered?: NA  Functional Questionnaire: Do you need assistance with bathing/showering or dressing?: No Do you need assistance with meal preparation?: No Do you need assistance with eating?: No Do you have difficulty maintaining continence: No Do you need assistance with getting out of bed/getting out of a chair/moving?: No Do you have difficulty managing or taking your medications?: No  Follow up appointments reviewed: PCP Follow-up appointment confirmed?: NA Specialist Hospital Follow-up appointment confirmed?: Yes Date of Specialist follow-up appointment?: 05/14/23 Follow-Up Specialty Provider:: GYN Do you need transportation to your follow-up appointment?: No Do you understand care options if your condition(s) worsen?: Yes-patient verbalized understanding    SIGNATURE Karena Addison, LPN [facility] Nurse  Health Advisor Direct Dial [phone]

## 2023-05-01 NOTE — Telephone Encounter (Signed)
scheduled

## 2023-05-03 ENCOUNTER — Other Ambulatory Visit (HOSPITAL_COMMUNITY): Payer: Self-pay

## 2023-05-06 ENCOUNTER — Other Ambulatory Visit (HOSPITAL_COMMUNITY): Payer: Self-pay

## 2023-05-07 ENCOUNTER — Other Ambulatory Visit (HOSPITAL_COMMUNITY): Payer: Self-pay

## 2023-05-07 ENCOUNTER — Ambulatory Visit (INDEPENDENT_AMBULATORY_CARE_PROVIDER_SITE_OTHER): Payer: 59 | Admitting: Family Medicine

## 2023-05-07 ENCOUNTER — Other Ambulatory Visit: Payer: Self-pay

## 2023-05-07 ENCOUNTER — Telehealth: Payer: Self-pay | Admitting: Nurse Practitioner

## 2023-05-07 ENCOUNTER — Other Ambulatory Visit: Payer: Self-pay | Admitting: Nurse Practitioner

## 2023-05-07 DIAGNOSIS — G8929 Other chronic pain: Secondary | ICD-10-CM

## 2023-05-07 DIAGNOSIS — M25562 Pain in left knee: Secondary | ICD-10-CM

## 2023-05-07 DIAGNOSIS — M1712 Unilateral primary osteoarthritis, left knee: Secondary | ICD-10-CM

## 2023-05-07 DIAGNOSIS — Z6841 Body Mass Index (BMI) 40.0 and over, adult: Secondary | ICD-10-CM

## 2023-05-07 MED ORDER — TRIAMCINOLONE ACETONIDE 32 MG IX SRER
32.0000 mg | Freq: Once | INTRA_ARTICULAR | Status: AC
Start: 2023-05-07 — End: 2023-05-07
  Administered 2023-05-07: 32 mg via INTRA_ARTICULAR

## 2023-05-07 NOTE — Patient Instructions (Signed)
Thank you for coming in today.   You received an injection today. Seek immediate medical attention if the joint becomes red, extremely painful, or is oozing fluid.   Let me know how you are feeling next week.   We could move to the gel shots if needed. I just need a heads up.

## 2023-05-07 NOTE — Telephone Encounter (Addendum)
Pt received Zilretta inj for LEFT knee OA on 05/07/23.

## 2023-05-07 NOTE — Telephone Encounter (Signed)
Patient's insurance denied wygovey - patient wants to know if you can submit the other medication.  Please send to [facility].

## 2023-05-07 NOTE — Telephone Encounter (Signed)
Pt received Zilretta inj for LEFT knee OA on 05/07/23.   Can consider repeat on or after 07/31/23

## 2023-05-07 NOTE — Progress Notes (Signed)
[name] presents to clinic today for Zilretta injection left knee  Procedure: Real-time Ultrasound Guided Injection of left knee joint superior lateral patellar space joint superior lateral patellar space Device: Philips Affiniti 50G Images permanently stored and available for review in PACS Verbal informed consent obtained.  Discussed risks and benefits of procedure. Warned about infection, hyperglycemia, bleeding, damage to structures among others. Patient expresses understanding and agreement Time-out conducted.   Noted no overlying erythema, induration, or other signs of local infection.   Skin prepped in a sterile fashion.   Local anesthesia: Topical Ethyl chloride.   With sterile technique and under real time ultrasound guidance: Zilretta 32 mg injected into knee joint. Fluid seen entering the joint capsule.   Completed without difficulty   Advised to call if fevers/chills, erythema, induration, drainage, or persistent bleeding.   Images permanently stored and available for review in the ultrasound unit.  Impression: Technically successful ultrasound guided injection. Lot number: [id number] If this does not work next step will be gel injections which are already authorized.  If that does not work we will proceed to geniculate artery embolization for pain control.

## 2023-05-08 ENCOUNTER — Other Ambulatory Visit (HOSPITAL_COMMUNITY): Payer: Self-pay

## 2023-05-09 ENCOUNTER — Other Ambulatory Visit (HOSPITAL_COMMUNITY): Payer: Self-pay

## 2023-05-09 MED ORDER — ZEPBOUND 2.5 MG/0.5ML ~~LOC~~ SOAJ
2.5000 mg | SUBCUTANEOUS | 1 refills | Status: DC
Start: 2023-05-09 — End: 2023-05-22
  Filled 2023-05-09 – 2023-05-13 (×2): qty 2, 28d supply, fill #0

## 2023-05-09 NOTE — Telephone Encounter (Signed)
Please initiate prior authorization for ZepBound

## 2023-05-12 ENCOUNTER — Other Ambulatory Visit (HOSPITAL_COMMUNITY): Payer: Self-pay

## 2023-05-12 ENCOUNTER — Other Ambulatory Visit (HOSPITAL_COMMUNITY): Payer: Self-pay | Admitting: Surgery

## 2023-05-12 NOTE — Telephone Encounter (Signed)
Forwarding msg to RX Prior auth team../lmb

## 2023-05-12 NOTE — Telephone Encounter (Signed)
Called pt no answer LMOM MD rx Zepbound. Rx has been sent to [facility]...Santiago Bumpers

## 2023-05-13 ENCOUNTER — Telehealth: Payer: Self-pay | Admitting: Nurse Practitioner

## 2023-05-13 ENCOUNTER — Encounter: Payer: Self-pay | Admitting: Obstetrics and Gynecology

## 2023-05-13 ENCOUNTER — Ambulatory Visit (INDEPENDENT_AMBULATORY_CARE_PROVIDER_SITE_OTHER): Payer: 59 | Admitting: Obstetrics and Gynecology

## 2023-05-13 ENCOUNTER — Telehealth: Payer: Self-pay

## 2023-05-13 ENCOUNTER — Other Ambulatory Visit (HOSPITAL_COMMUNITY): Payer: Self-pay

## 2023-05-13 VITALS — BP 135/85 | HR 67 | Wt 322.0 lb

## 2023-05-13 DIAGNOSIS — N939 Abnormal uterine and vaginal bleeding, unspecified: Secondary | ICD-10-CM | POA: Diagnosis not present

## 2023-05-13 DIAGNOSIS — D219 Benign neoplasm of connective and other soft tissue, unspecified: Secondary | ICD-10-CM | POA: Diagnosis not present

## 2023-05-13 MED ORDER — DESOGESTREL-ETHINYL ESTRADIOL 0.15-30 MG-MCG PO TABS
1.0000 | ORAL_TABLET | Freq: Every day | ORAL | 11 refills | Status: DC
Start: 2023-05-13 — End: 2023-06-17

## 2023-05-13 NOTE — Progress Notes (Signed)
Pt is here for ED follow up.  Pt had bleeding the entire month of May, was seen at hospital and was given Provera and bleeding stopped with use.  Pt would like to discuss fibroid and if it is cause of bleeding, if so pt would like "fibroid frozen".  Pt has hx of BTL.

## 2023-05-13 NOTE — Telephone Encounter (Signed)
Patient states that Zepbound is not covered by her insurance.

## 2023-05-13 NOTE — Progress Notes (Signed)
Ms James Ivanoff presents for F/U of her AUB and uterine fibroids See prior office notes She had another episode of prolonged vaginal bleeding. Resolved with Provera  PE AF VSS Lungs clear Heart RRR Abd soft + BS  A/P AUB/Uterine fibroids  Discussed Tx options with pt. Pt desires to try medication. Will start Apri. U/R/B reviewed with pt To start this Sunday. F/U in 3 months to gage response

## 2023-05-13 NOTE — Telephone Encounter (Signed)
Pharmacy Patient Advocate Encounter  Received notification from [employer] that the request for prior authorization for Zepbound has been denied due to drugs used for anorexia, weight loss, or weight gain are excluded from coverage under Medicare rules.

## 2023-05-13 NOTE — Telephone Encounter (Signed)
Pharmacy Patient Advocate Encounter   Received notification that prior authorization for Zepbound is required/requested.   PA submitted to [facility] Medicare via CoverMyMeds Key # [id number] Status is pending

## 2023-05-14 ENCOUNTER — Ambulatory Visit: Payer: 59 | Admitting: Obstetrics and Gynecology

## 2023-05-14 NOTE — Telephone Encounter (Signed)
Not much information was provided from pt call. Send pt mychart message

## 2023-05-14 NOTE — Telephone Encounter (Signed)
Mychart message is send to pt about denial

## 2023-05-20 ENCOUNTER — Ambulatory Visit (INDEPENDENT_AMBULATORY_CARE_PROVIDER_SITE_OTHER): Payer: 59 | Admitting: Dermatology

## 2023-05-20 VITALS — BP 148/94 | HR 75

## 2023-05-20 DIAGNOSIS — Z79899 Other long term (current) drug therapy: Secondary | ICD-10-CM | POA: Diagnosis not present

## 2023-05-20 DIAGNOSIS — L732 Hidradenitis suppurativa: Secondary | ICD-10-CM

## 2023-05-20 DIAGNOSIS — L905 Scar conditions and fibrosis of skin: Secondary | ICD-10-CM | POA: Diagnosis not present

## 2023-05-20 MED ORDER — COSENTYX UNOREADY 300 MG/2ML ~~LOC~~ SOAJ
1.0000 | SUBCUTANEOUS | 5 refills | Status: DC
Start: 2023-05-20 — End: 2023-10-09

## 2023-05-20 MED ORDER — FLUCONAZOLE 150 MG PO TABS
150.0000 mg | ORAL_TABLET | Freq: Every day | ORAL | 4 refills | Status: DC
Start: 2023-05-20 — End: 2023-09-30

## 2023-05-20 MED ORDER — CLINDAMYCIN PHOSPHATE 1 % EX SOLN
CUTANEOUS | 5 refills | Status: DC
Start: 2023-05-20 — End: 2024-04-01

## 2023-05-20 NOTE — Patient Instructions (Addendum)
Recommend [facility] Hypochlorous Spray (found in the wound care section) OR Cln brand Acne or Sports wash. The [facility] Hypochlorous Spray can be sprayed on daily and left on. The Cln wash should be applied to the affected area daily for at least 30 seconds and then rinsed off. If you are using clindamycin solution or lotion or another topical antibiotic to treat acne, using a hypochlorous product may help lower the risk of antibiotic resistant bacteria.   Due to recent changes in healthcare laws, you may see results of your pathology and/or laboratory studies on MyChart before the doctors have had a chance to review them. We understand that in some cases there may be results that are confusing or concerning to you. Please understand that not all results are received at the same time and often the doctors may need to interpret multiple results in order to provide you with the best plan of care or course of treatment. Therefore, we ask that you please give us 2 business days to thoroughly review all your results before contacting the office for clarification. Should we see a critical lab result, you will be contacted sooner.   If You Need Anything After Your Visit  If you have any questions or concerns for your doctor, please call our main line at [phone] and press option 4 to reach your doctor's medical assistant. If no one answers, please leave a voicemail as directed and we will return your call as soon as possible. Messages left after 4 pm will be answered the following business day.   You may also send us a message via MyChart. We typically respond to MyChart messages within 1-2 business days.  For prescription refills, please ask your pharmacy to contact our office. Our fax number is [phone].  If you have an urgent issue when the clinic is closed that cannot wait until the next business day, you can page your doctor at the number below.    Please note that while we do our best to be  available for urgent issues outside of office hours, we are not available 24/7.   If you have an urgent issue and are unable to reach us, you may choose to seek medical care at your doctor's office, retail clinic, urgent care center, or emergency room.  If you have a medical emergency, please immediately call 911 or go to the emergency department.  Pager Numbers  - Dr. Kowalski: [phone]  - Dr. Moye: [phone]  - Dr. Stewart: [phone]  In the event of inclement weather, please call our main line at [phone] for an update on the status of any delays or closures.  Dermatology Medication Tips: Please keep the boxes that topical medications come in in order to help keep track of the instructions about where and how to use these. Pharmacies typically print the medication instructions only on the boxes and not directly on the medication tubes.   If your medication is too expensive, please contact our office at [phone] option 4 or send us a message through MyChart.   We are unable to tell what your co-pay for medications will be in advance as this is different depending on your insurance coverage. However, we may be able to find a substitute medication at lower cost or fill out paperwork to get insurance to cover a needed medication.   If a prior authorization is required to get your medication covered by your insurance company, please allow us 1-2 business days to complete this process.    Drug prices often vary depending on where the prescription is filled and some pharmacies may offer cheaper prices.  The website www.goodrx.com contains coupons for medications through different pharmacies. The prices here do not account for what the cost may be with help from insurance (it may be cheaper with your insurance), but the website can give you the price if you did not use any insurance.  - You can print the associated coupon and take it with your prescription to the pharmacy.  -  You may also stop by our office during regular business hours and pick up a GoodRx coupon card.  - If you need your prescription sent electronically to a different pharmacy, notify our office through [place] MyChart or by phone at [phone] option 4.     Si Usted Necesita Algo Despus de Su Visita  Tambin puede enviarnos un mensaje a travs de MyChart. Por lo general respondemos a los mensajes de MyChart en el transcurso de 1 a 2 das hbiles.  Para renovar recetas, por favor pida a su farmacia que se ponga en contacto con nuestra oficina. Nuestro nmero de fax es el [phone].  Si tiene un asunto urgente cuando la clnica est cerrada y que no puede esperar hasta el siguiente da hbil, puede llamar/localizar a su doctor(a) al nmero que aparece a continuacin.   Por favor, tenga en cuenta que aunque hacemos todo lo posible para estar disponibles para asuntos urgentes fuera del horario de oficina, no estamos disponibles las 24 horas del da, los 7 das de la semana.   Si tiene un problema urgente y no puede comunicarse con nosotros, puede optar por buscar atencin mdica  en el consultorio de su doctor(a), en una clnica privada, en un centro de atencin urgente o en una sala de emergencias.  Si tiene una emergencia mdica, por favor llame inmediatamente al 911 o vaya a la sala de emergencias.  Nmeros de bper  - Dr. Kowalski: [phone]  - Dra. Moye: [phone]  - Dra. Stewart: [phone]  En caso de inclemencias del tiempo, por favor llame a nuestra lnea principal al [phone] para una actualizacin sobre el estado de cualquier retraso o cierre.  Consejos para la medicacin en dermatologa: Por favor, guarde las cajas en las que vienen los medicamentos de uso tpico para ayudarle a seguir las instrucciones sobre dnde y cmo usarlos. Las farmacias generalmente imprimen las instrucciones del medicamento slo en las cajas y no directamente en los tubos del  medicamento.   Si su medicamento es muy caro, por favor, pngase en contacto con nuestra oficina llamando al [phone] y presione la opcin 4 o envenos un mensaje a travs de MyChart.   No podemos decirle cul ser su copago por los medicamentos por adelantado ya que esto es diferente dependiendo de la cobertura de su seguro. Sin embargo, es posible que podamos encontrar un medicamento sustituto a menor costo o llenar un formulario para que el seguro cubra el medicamento que se considera necesario.   Si se requiere una autorizacin previa para que su compaa de seguros cubra su medicamento, por favor permtanos de 1 a 2 das hbiles para completar este proceso.  Los precios de los medicamentos varan con frecuencia dependiendo del lugar de dnde se surte la receta y alguna farmacias pueden ofrecer precios ms baratos.  El sitio web www.goodrx.com tiene cupones para medicamentos de diferentes farmacias. Los precios aqu no tienen en cuenta lo que podra costar con la ayuda del seguro (puede ser ms   barato con su seguro), pero el sitio web puede darle el precio si no utiliz ningn seguro.  - Puede imprimir el cupn correspondiente y llevarlo con su receta a la farmacia.  - Tambin puede pasar por nuestra oficina durante el horario de atencin regular y recoger una tarjeta de cupones de GoodRx.  - Si necesita que su receta se enve electrnicamente a una farmacia diferente, informe a nuestra oficina a travs de MyChart de [place] o por telfono llamando al [phone] y presione la opcin 4.  

## 2023-05-20 NOTE — Progress Notes (Signed)
Follow Up Visit   Subjective  [name] is a 47 y.o. female who presents for the following: bumps  3 month HS Follow up No new areas but still same open areas at chest on breasts. Stay painful and drain.  Bleeds through bandages.  Not able to wear a seat belt because it is too tight across chest and is painful.  She has been on Cosentyx, and hasn't been getting new active lesions.  She also takes doxycycline daily, and uses topical clindamycin.  No side effects from Cosentyx. Recently got ticket for not wearing seatbelt- needs note for court date explaining condition.   The following portions of the chart were reviewed this encounter and updated as appropriate: medications, allergies, medical history  Review of Systems:  No other skin or systemic complaints except as noted in HPI or Assessment and Plan.  Objective  Well appearing patient in no apparent distress; mood and affect are within normal limits.  A focused examination was performed of the following areas:  B/l breast, b/l axilla, back  Relevant exam findings are noted in the Assessment and Plan.    Assessment & Plan   Hidradenitis suppurativa  Related Procedures Ambulatory referral to General Surgery  Related Medications minocycline (MINOCIN) 100 MG capsule Take 1-2 capsules by mouth daily.  Secukinumab (COSENTYX UNOREADY) 300 MG/2ML SOAJ Inject 1 Pen into the skin every 28 (twenty-eight) days.  clindamycin (CLEOCIN T) 1 % external solution Apply to open sores QD after shower.  fluconazole (DIFLUCAN) 150 MG tablet Take 1 tablet (150 mg total) by mouth daily.    HIDRADENITIS SUPPURATIVA- Severe with extensive scarring Exam: ulcerations with oozing at right breast at areola x 3 with tunneling, and left medial breast with firm nodules tender to touch , subcutaneous firm nodules at left intermammary  Severe ropey scarring with hyperpigmentation bilateral axilla- no active lesions.  Chronic and  persistent condition with duration or expected duration over one year. Condition is bothersome/symptomatic for patient. Currently flared.   Hidradenitis Suppurativa is a chronic; persistent; non-curable, but treatable condition due to abnormal inflamed sweat glands in the body folds (axilla, inframammary, groin, medial thighs), causing recurrent painful draining cysts and scarring. It can be associated with severe scarring acne and cysts; also abscesses and scarring of scalp. The goal is control and prevention of flares, as it is not curable. Scars are permanent and can be thickened. Treatment may include daily use of topical medication and oral antibiotics.  Oral isotretinoin may also be helpful.  For some cases, Humira or Cosentyx (biologic injections) may be prescribed to decrease the inflammatory process and prevent flares.  When indicated, inflamed cysts may also be treated surgically.  Treatment Plan:  Recommend Referral to General Surgeon for consult to surgically remove persistent draining areas on breasts.  Possible unroofing tunnels/tracts and let heal by secondary intention? Will submit referral to [facility] Surgeon for severe HS of breast preventing use of seatbelt when driving due to pain. Patient given written note explaining her condition to present at court, due to recent ticket she received for not wearing a seatbelt  Patient had side effect to minocycline- eye conjunctiva darkening  Continue  Cosentyx 300 mg/36ml inject subcutaneous monthly  Continue Clindamycin lotion qd as directed.  Continue Fluconazole 150 take 1 po as directed dsp #1 4Rf  Continue doxycycline 100 mg capsule take po every day with food and bid with flares.  Consider Bimzelx off label use for HS, would have to use samples.  Doxycycline should be taken with food to prevent nausea. Do not lay down for 30 minutes after taking. Be cautious with sun exposure and use good sun protection while on this medication.  Pregnant women should not take this medication.    Reviewed risks of biologics including immunosuppression, infections, injection site reaction, and failure to improve condition. Goal is control of skin condition, not cure.  Some older biologics such as Humira and Enbrel may slightly increase risk of malignancy and may worsen congestive heart failure.  Taltz and Cosentyx may cause inflammatory bowel disease to flare. The use of biologics requires long term medication management, including periodic office visits and monitoring of blood work.   Return in 6 months (on 11/19/2023) for HS follow up.  I, Asher Muir, CMA, am acting as scribe for Willeen Niece, MD.   Documentation: I have reviewed the above documentation for accuracy and completeness, and I agree with the above.  Willeen Niece, MD

## 2023-05-22 ENCOUNTER — Other Ambulatory Visit: Payer: Self-pay | Admitting: Nurse Practitioner

## 2023-05-22 ENCOUNTER — Other Ambulatory Visit (HOSPITAL_COMMUNITY): Payer: Self-pay

## 2023-05-22 MED ORDER — OZEMPIC (0.25 OR 0.5 MG/DOSE) 2 MG/3ML ~~LOC~~ SOPN
0.2500 mg | PEN_INJECTOR | SUBCUTANEOUS | 1 refills | Status: DC
Start: 2023-05-22 — End: 2023-06-06
  Filled 2023-05-22: qty 3, 28d supply, fill #0

## 2023-05-23 ENCOUNTER — Other Ambulatory Visit: Payer: Self-pay | Admitting: Dermatology

## 2023-05-26 ENCOUNTER — Other Ambulatory Visit: Payer: Self-pay | Admitting: Nurse Practitioner

## 2023-05-26 DIAGNOSIS — N939 Abnormal uterine and vaginal bleeding, unspecified: Secondary | ICD-10-CM

## 2023-05-26 DIAGNOSIS — D219 Benign neoplasm of connective and other soft tissue, unspecified: Secondary | ICD-10-CM

## 2023-05-27 ENCOUNTER — Other Ambulatory Visit (HOSPITAL_COMMUNITY): Payer: Self-pay

## 2023-05-30 ENCOUNTER — Other Ambulatory Visit (HOSPITAL_COMMUNITY): Payer: Self-pay

## 2023-05-30 NOTE — Telephone Encounter (Signed)
Pt needing PA for Ozempic

## 2023-05-30 NOTE — Telephone Encounter (Signed)
PA request is send to the RxPA team for ozempic

## 2023-06-02 ENCOUNTER — Telehealth: Payer: Self-pay

## 2023-06-02 ENCOUNTER — Other Ambulatory Visit (HOSPITAL_COMMUNITY): Payer: Self-pay

## 2023-06-02 NOTE — Telephone Encounter (Signed)
Pharmacy Patient Advocate Encounter  Received notification from [employer] that the request for prior authorization for Ozempic has been denied due to Drugs when used for anorexia, weight loss, or weight gain are excluded from coverage under Medicare rules.

## 2023-06-02 NOTE — Telephone Encounter (Signed)
Mychart message is send to pt about denial  

## 2023-06-03 ENCOUNTER — Other Ambulatory Visit (HOSPITAL_COMMUNITY): Payer: Self-pay

## 2023-06-06 ENCOUNTER — Telehealth: Payer: Self-pay | Admitting: Nurse Practitioner

## 2023-06-06 ENCOUNTER — Ambulatory Visit (INDEPENDENT_AMBULATORY_CARE_PROVIDER_SITE_OTHER): Payer: 59 | Admitting: Nurse Practitioner

## 2023-06-06 ENCOUNTER — Other Ambulatory Visit: Payer: Self-pay

## 2023-06-06 VITALS — BP 138/82 | HR 66 | Temp 97.9°F | Ht 68.0 in | Wt 323.2 lb

## 2023-06-06 DIAGNOSIS — E611 Iron deficiency: Secondary | ICD-10-CM | POA: Diagnosis not present

## 2023-06-06 DIAGNOSIS — R011 Cardiac murmur, unspecified: Secondary | ICD-10-CM

## 2023-06-06 DIAGNOSIS — Z6841 Body Mass Index (BMI) 40.0 and over, adult: Secondary | ICD-10-CM

## 2023-06-06 DIAGNOSIS — R109 Unspecified abdominal pain: Secondary | ICD-10-CM | POA: Diagnosis not present

## 2023-06-06 LAB — FERRITIN: Ferritin: 28.4 ng/mL (ref 10.0–291.0)

## 2023-06-06 LAB — CBC
HCT: 38.1 % (ref 36.0–46.0)
Hemoglobin: 12.2 g/dL (ref 12.0–15.0)
MCHC: 32.2 g/dL (ref 30.0–36.0)
MCV: 87.3 fl (ref 78.0–100.0)
Platelets: 227 10*3/uL (ref 150.0–400.0)
RBC: 4.36 Mil/uL (ref 3.87–5.11)
RDW: 17.2 % — ABNORMAL HIGH (ref 11.5–15.5)
WBC: 12.5 10*3/uL — ABNORMAL HIGH (ref 4.0–10.5)

## 2023-06-06 LAB — IRON: Iron: 30 ug/dL — ABNORMAL LOW (ref 42–145)

## 2023-06-06 MED ORDER — ZEPBOUND 2.5 MG/0.5ML ~~LOC~~ SOAJ
2.5000 mg | SUBCUTANEOUS | 1 refills | Status: DC
Start: 2023-06-06 — End: 2023-08-01

## 2023-06-06 MED ORDER — FERROUS SULFATE 325 (65 FE) MG PO TBEC
325.0000 mg | DELAYED_RELEASE_TABLET | Freq: Two times a day (BID) | ORAL | 1 refills | Status: DC
Start: 2023-06-06 — End: 2024-02-27

## 2023-06-06 NOTE — Assessment & Plan Note (Signed)
Chronic Will order Zepbound for weight management, start 2.5mg /week.  Patient has previously been educated regarding injection technique, potential side effects, patient to follow-up in 4 to 6 weeks or sooner as needed.

## 2023-06-06 NOTE — Progress Notes (Signed)
Established Patient Office Visit  Subjective   Patient ID: [name], female    DOB: 12/02/1975  Age: 47 y.o. MRN: [id number]  Chief Complaint  Patient presents with   Weight Loss    Medication for weight loss, Insurance said they will pay for tirzepatide instead on the Ozempic( name brand)   Abdominal Pain: Symptom onset 1 week ago, intermittent, only last for few minutes before spontaneously resolving.  No obvious triggers.  Associated nausea, no vomiting patient reports mild constipation.  Denies seeing blood in stool.  IDA: Continues on ferrous sulfate supplementation twice a day.  Here to have her iron levels rechecked.  Did see OB/GYN and is now on medication for her vaginal bleeding.  Obesity: We were unable to get Ozempic or [facility] approved for use in weight loss.  Patient reports that her insurance company told her tirzepatide would be approved.  She would like to try this medication.     Review of Systems  Respiratory:  Negative for shortness of breath.   Cardiovascular:  Negative for chest pain.  Gastrointestinal:  Positive for abdominal pain and nausea. Negative for blood in stool, constipation and vomiting.      Objective:     BP 138/82   Pulse 66   Temp 97.9 F (36.6 C) (Temporal)   Ht 5\' 8"  (1.727 m)   Wt (!) 323 lb 4 oz (146.6 kg)   LMP 04/03/2023   SpO2 96%   BMI 49.15 kg/m  BP Readings from Last 3 Encounters:  06/06/23 138/82  05/20/23 (!) 148/94  05/13/23 135/85   Wt Readings from Last 3 Encounters:  06/06/23 (!) 323 lb 4 oz (146.6 kg)  05/13/23 (!) 322 lb (146.1 kg)  04/30/23 (!) 321 lb 14 oz (146 kg)      Physical Exam Vitals reviewed.  Constitutional:      General: She is not in acute distress.    Appearance: Normal appearance.  HENT:     Head: Normocephalic and atraumatic.  Neck:     Vascular: No carotid bruit.  Cardiovascular:     Rate and Rhythm: Normal rate and regular rhythm.     Pulses: Normal pulses.     Heart  sounds: Murmur heard.  Pulmonary:     Effort: Pulmonary effort is normal.     Breath sounds: Normal breath sounds.  Abdominal:     General: Bowel sounds are normal. There is no distension.     Palpations: Abdomen is soft.     Tenderness: There is abdominal tenderness in the right upper quadrant and right lower quadrant.  Skin:    General: Skin is warm and dry.  Neurological:     General: No focal deficit present.     Mental Status: She is alert and oriented to person, place, and time.  Psychiatric:        Mood and Affect: Mood normal.        Behavior: Behavior normal.        Judgment: Judgment normal.      No results found for any visits on 06/06/23.    The 10-year ASCVD risk score (Arnett DK, et al., 2019) is: 3.2%    Assessment & Plan:   Problem List Items Addressed This Visit       Other   Class 3 severe obesity due to excess calories with body mass index (BMI) of 45.0 to 49.9 in adult [facility])    Chronic Will order Zepbound for weight management, start  2.5mg /week.  Patient has previously been educated regarding injection technique, potential side effects, patient to follow-up in 4 to 6 weeks or sooner as needed.      Relevant Medications   tirzepatide (ZEPBOUND) 2.5 MG/0.5ML Pen   Abdominal pain - Primary    Acute, intermittent, mild Some tenderness to right upper quadrant on exam.  Will order ultrasound for further evaluation.  Likely related to constipation.  Recommend starting docusate for stool softener twice a day.  Follow-up in 4 weeks.      Relevant Orders   US Abdomen Limited RUQ (LIVER/GB)   Iron deficiency    Check CBC, iron, ferritin levels.  Further recommendations may be made based on these results.  For now continue ferrous sulfate supplementation twice a day.      Relevant Medications   ferrous sulfate 325 (65 FE) MG EC tablet   Other Relevant Orders   Iron   CBC   Ferritin   Murmur    Incidental finding.  Patient denies chest pain,  shortness of breath, exertional fatigue. Check cardiac echocardiogram for further evaluation.      Relevant Orders   ECHOCARDIOGRAM COMPLETE    Return in about 6 weeks (around 07/18/2023) for F/U with Maralyn Sago.    Elenore Paddy, NP

## 2023-06-06 NOTE — Assessment & Plan Note (Signed)
Check CBC, iron, ferritin levels.  Further recommendations may be made based on these results.  For now continue ferrous sulfate supplementation twice a day.

## 2023-06-06 NOTE — Telephone Encounter (Signed)
Can we start PA for ZepBound for weight los?

## 2023-06-06 NOTE — Assessment & Plan Note (Signed)
Acute, intermittent, mild Some tenderness to right upper quadrant on exam.  Will order ultrasound for further evaluation.  Likely related to constipation.  Recommend starting docusate for stool softener twice a day.  Follow-up in 4 weeks.

## 2023-06-06 NOTE — Assessment & Plan Note (Signed)
Incidental finding.  Patient denies chest pain, shortness of breath, exertional fatigue. Check cardiac echocardiogram for further evaluation.

## 2023-06-06 NOTE — Patient Instructions (Signed)
Docusate Sodium - 100mg  by mouth in the morning and the evening

## 2023-06-09 ENCOUNTER — Encounter: Payer: 59 | Attending: Surgery | Admitting: Dietician

## 2023-06-09 ENCOUNTER — Telehealth: Payer: Self-pay

## 2023-06-09 ENCOUNTER — Other Ambulatory Visit (HOSPITAL_COMMUNITY): Payer: Self-pay

## 2023-06-09 ENCOUNTER — Encounter: Payer: Self-pay | Admitting: Dietician

## 2023-06-09 VITALS — Ht 68.0 in | Wt 324.7 lb

## 2023-06-09 DIAGNOSIS — E669 Obesity, unspecified: Secondary | ICD-10-CM | POA: Diagnosis present

## 2023-06-09 DIAGNOSIS — Z713 Dietary counseling and surveillance: Secondary | ICD-10-CM | POA: Diagnosis not present

## 2023-06-09 DIAGNOSIS — Z6841 Body Mass Index (BMI) 40.0 and over, adult: Secondary | ICD-10-CM | POA: Diagnosis not present

## 2023-06-09 NOTE — Telephone Encounter (Signed)
PA needed for Tirzepatide

## 2023-06-09 NOTE — Progress Notes (Signed)
Nutrition Assessment for Bariatric Surgery: Pre-Surgery Behavioral and Nutrition Intervention Program   Medical Nutrition Therapy  Appt Start Time: 9:40    End Time: 10:44  Patient was seen on 06/09/2023 for Pre-Operative Nutrition Assessment. Purpose of todays visit  enhance perioperative outcomes along with a healthy weight maintenance   Referral stated Supervised Weight Loss (SWL) visits needed: 6 months  Planned surgery: RYGB Pt expectation of surgery:  to be 250-260 lbs   NUTRITION ASSESSMENT   Anthropometrics  Start weight at [facility]: 324.7 lbs (date: 06/09/2023)  Height: 68 in BMI: 49.37 kg/m2     Clinical   Pharmacotherapy: History of weight loss medication used: zepbound has not been approved Medical hx: reflux,  Medications: albuterol (PROAIR HFA) 108 (90 Base) MCG/ACT inhaler albuterol (PROVENTIL) (2.5 MG/3ML) 0.083% nebulizer solution albuterol (VENTOLIN HFA) 108 (90 Base) MCG/ACT inhaler amitriptyline (ELAVIL) 25 MG tablet Azelastine HCl 137 MCG/SPRAY SOLN clindamycin (CLEOCIN T) 1 % external solution cromolyn (OPTICROM) 4 % ophthalmic solution desogestrel-ethinyl estradiol (APRI) 0.15-30 MG-MCG tablet docusate sodium (COLACE) 100 MG capsule dorzolamide-timolol (COSOPT) 2-0.5 % ophthalmic solution doxycycline (MONODOX) 100 MG capsule EPINEPHRINE 0.3 mg/0.3 mL IJ SOAJ injection ergocalciferol (VITAMIN D2) 1.25 MG (50000 UT) capsule esomeprazole (NEXIUM) 40 MG capsule ferrous sulfate 325 (65 FE) MG EC tablet fluconazole (DIFLUCAN) 150 MG tablet fluticasone (FLONASE) 50 MCG/ACT nasal spray ibuprofen (ADVIL) 800 MG tablet levocetirizine (XYZAL) 5 MG tablet minocycline (MINOCIN) 100 MG capsule montelukast (SINGULAIR) 10 MG tablet oxyCODONE-acetaminophen (PERCOCET) 5-325 MG tablet Secukinumab (COSENTYX UNOREADY) 300 MG/2ML SOAJ Spacer/Aero-Holding Chambers (AEROCHAMBER PLUS) inhaler TRELEGY ELLIPTA 200-62.5-25 MCG/ACT AEPB    Labs: LDL 126; iron 32; RDW  18.9 Notable signs/symptoms: none noted Any previous deficiencies? No  Evaluation of Nutritional Deficiencies: Micronutrient Nutrition Focused Physical Exam: Hair: No issues observed Eyes: No issues observed Mouth: No issues observed Neck: No issues observed Nails: No issues observed Skin: No issues observed  Lifestyle & Dietary Hx  Pt states she would like get to 40 BMI, to have hip surgery. Pt states she was 500 lbs in about 2000  Pt states she is caregiver for her grandchildren. Pt states she quit smoking in April, stating she replaced it with candy and gained weight. Pt states she has stopped eating candy. Pt states she is meat eater, stating she does not like vegetables. Pt states she is trying to eat more vegetables. Pt states she is intermittent fasting, stating she eats from noon to 8 pm. Pt states she and her daughter are on the same meal pattern.  Current Physical Activity Recommendations state 150 minutes per week of moderate to vigorous movement including Cardio and 1-2 days of resistance activities as well as flexibility/balance activities:  Pts current physical activity: gym (bike and treadmill), daily about 60 minutes, with 100% recommendation reached   Sleep Hygiene: duration and quality: lovely/well; about 7 hours a night  Current Patient Perceived Stress Level as stated by pt on a scale of 1-10:  2-3       Stress Management Techniques: n/a  According to the Dietary Guidelines for Americans Recommendation: equivalent 1.5-2 cups fruits per day, equivalent 2-3 cups vegetables per day and at least half all grains whole  Fruit servings per day (on average): 0, meeting 0% recommendation  Non-starchy vegetable servings per day (on average): 1, meeting 33-50% recommendation  Whole Grains per day (on average): 0  Number of meals missed/skipped per week out of 21: 7  24-Hr Dietary Recall First Meal: skip Snack:  Second Meal: protein drink  with fiber Snack: protein  drink Third Meal: protein meal (egg, tuna, baked chicken), vegetables Snack: small bag of chips Beverages: water  Alcoholic beverages per week: 0  Estimated Energy Needs Calories: 1600  NUTRITION DIAGNOSIS  Overweight/obesity ([place]-3.3) related to past poor dietary habits and physical inactivity as evidenced by patient w/ planned RYGB surgery following dietary guidelines for continued weight loss.  NUTRITION INTERVENTION  Nutrition counseling (C-1) and education (E-2) to facilitate bariatric surgery goals.  Educated pt on micronutrient deficiencies post-surgery and behavioral/dietary strategies to start in order to mitigate that risk   Behavioral and Dietary Interventions Pre-Op Goals Reviewed with the Patient Nutrition: Healthy Eating Behaviors Switch to non-caloric, non-carbonated and non-caffeinated beverages such as  water, unsweetened tea, Crystal Light and zero calorie beverages (aim for 64 oz. per day) Cut out grazing between meals or at night  Find a protein shake you like Eat every 3-5 hours        Eliminate distractions while eating (TV, computer, reading, driving, texting) Take 16-10 minutes to eat a meal  Decrease high sugar foods/decrease high fat/fried foods Eliminate alcoholic beverages Increase protein intake (eggs, fish, chicken, yogurt) before surgery Eat non starchy vegetables 2 times a day 7 days a week Eat complex carbohydrates such as whole grains and fruits   Behavioral Modification: Physical Activity Increase my usual daily activity (use stairs, park farther, etc.) Engage in _______________________  activity  _______ minutes ______ times per week  Other:    _________________________________________________________________     Problem Solving I will think about my usual eating patterns and how to tweak them How can my friends and family support me Barriers to starting my changes Learn and understand appetite verses hunger   Healthy Coping Allow for  ___________ activities per week to help me manage stress Reframe negative thoughts I will keep a picture of someone or something that is my inspiration & look at it daily   Monitoring  Weigh myself once a week  Measure my progress by monitoring how my clothes fit Keep a food record of what I eat and drink for the next ________ (time period) Take pictures of what I eat and drink for the next ________ (time period) Use an app to count steps/day for the next_______ (time period) Measure my progress such as increased energy and more restful sleep Monitor your acid reflux and bowel habits, are they getting better?   *Goals that are bolded indicate the pt would like to start working towards these  Handouts Provided Include  Bariatric Surgery handouts (Nutrition Visits, Pre Surgery Behavioral Change Goals, Protein Shakes Brands to Choose From, Vitamins & Mineral Supplementation)  Learning Style & Readiness for Change Teaching method utilized: Visual, Auditory, and hands on  Demonstrated degree of understanding via: Teach Back  Readiness Level: preparation Barriers to learning/adherence to lifestyle change: nothing identified  RD's Notes for Next Visit Patient progress toward chosen goals   MONITORING & EVALUATION Dietary intake, weekly physical activity, body weight, and preoperative behavioral change goals   Next Steps  Patient is to follow up at [facility] for SWL visit next month.

## 2023-06-09 NOTE — Telephone Encounter (Signed)
Thank you for letting me know. Tirzepatide is the generic name for ZepBound I sent the prescription in. I am not sure if insurance will cover if they already denied ZepBound PA in the past. Can we reach out to patient at some point this week to see if she was able to get the medication?

## 2023-06-10 NOTE — Telephone Encounter (Signed)
Sent pt mychart msg../lmb

## 2023-06-11 ENCOUNTER — Ambulatory Visit (HOSPITAL_COMMUNITY)
Admission: RE | Admit: 2023-06-11 | Discharge: 2023-06-11 | Disposition: A | Discharge status: Home / Self Care | Payer: 59 | Source: Ambulatory Visit | Attending: Surgery | Admitting: Surgery

## 2023-06-17 ENCOUNTER — Ambulatory Visit
Admission: RE | Admit: 2023-06-17 | Discharge: 2023-06-17 | Disposition: A | Discharge status: Home / Self Care | Payer: 59 | Source: Ambulatory Visit | Attending: Nurse Practitioner | Admitting: Nurse Practitioner

## 2023-06-17 ENCOUNTER — Telehealth: Payer: Self-pay

## 2023-06-17 DIAGNOSIS — D219 Benign neoplasm of connective and other soft tissue, unspecified: Secondary | ICD-10-CM

## 2023-06-17 DIAGNOSIS — N939 Abnormal uterine and vaginal bleeding, unspecified: Secondary | ICD-10-CM

## 2023-06-17 DIAGNOSIS — R109 Unspecified abdominal pain: Secondary | ICD-10-CM

## 2023-06-17 MED ORDER — DESOGESTREL-ETHINYL ESTRADIOL 0.15-30 MG-MCG PO TABS
1.0000 | ORAL_TABLET | Freq: Every day | ORAL | 11 refills | Status: DC
Start: 2023-06-17 — End: 2023-09-05

## 2023-06-17 NOTE — Telephone Encounter (Signed)
Received message on nurse line from pharmacy tech from [facility]--pharmacy requesting f/u in regards to birth control pills--specifically the desogestrel-ethinyl estradiol (Apri) 0.15-30mg -mcg tablet.   Called pharmacy back and spoke with pharm tech in regards to patient, verifying patient by full name and DOB--discovered that they did not have Rx for birth control as we sent it to the [facility] in [place] instead. Per pharm tech, RN just needs to send in Rx to [facility] and they will be able to get it filled and sent to patient.   RN sent Rx to [facility].   Maureen Ralphs RN on 06/17/23 at [phone]

## 2023-06-20 ENCOUNTER — Other Ambulatory Visit: Payer: Self-pay | Admitting: Allergy & Immunology

## 2023-06-25 ENCOUNTER — Encounter (INDEPENDENT_AMBULATORY_CARE_PROVIDER_SITE_OTHER): Payer: Self-pay

## 2023-06-26 ENCOUNTER — Encounter: Payer: 59 | Attending: Surgery | Admitting: Dietician

## 2023-06-26 ENCOUNTER — Encounter: Payer: Self-pay | Admitting: Dietician

## 2023-06-26 VITALS — Ht 68.0 in | Wt 320.9 lb

## 2023-06-26 DIAGNOSIS — Z6841 Body Mass Index (BMI) 40.0 and over, adult: Secondary | ICD-10-CM | POA: Insufficient documentation

## 2023-06-26 DIAGNOSIS — E669 Obesity, unspecified: Secondary | ICD-10-CM | POA: Diagnosis present

## 2023-06-26 DIAGNOSIS — Z713 Dietary counseling and surveillance: Secondary | ICD-10-CM | POA: Diagnosis not present

## 2023-06-26 NOTE — Progress Notes (Signed)
Supervised Weight Loss Visit Bariatric Nutrition Education Appt Start Time: 11:44    End Time: 12:05  Planned surgery: RYGB Pt expectation of surgery:  to be 250-260 lbs  1 out of 6 SWL Appointments    NUTRITION ASSESSMENT   Anthropometrics  Start weight at [facility]: 324.7 lbs (date: 06/09/2023)  Height: 68 in Weight today: 320.9 lb BMI: 48.79 kg/m2     Clinical   Pharmacotherapy: History of weight loss medication used: zepbound has not been approved Medical hx: reflux, asthma Medications: see list   Labs: LDL 126; iron 32; RDW 18.9 Notable signs/symptoms: none noted Any previous deficiencies? No  Lifestyle & Dietary Hx  Pt states she has a jug at home, stating she drinks it every day (64 oz) Pt states she is eating 3 meals a day (every 3-5 hours).  Estimated daily fluid intake: 64 oz Supplements: n/a Current average weekly physical activity: gym daily for 60 minutes (treadmill or bike)  24-Hr Dietary Recall First Meal: two eggs and toast Snack: fruit Second Meal: baked chicken with broccoli  Snack: nuts Third Meal: [place] lasagna  Snack:  Beverages: water  Estimated Energy Needs Calories: 1600   NUTRITION DIAGNOSIS  Overweight/obesity ([place]-3.3) related to past poor dietary habits and physical inactivity as evidenced by patient w/ planned RYGB surgery following dietary guidelines for continued weight loss.   NUTRITION INTERVENTION  Nutrition counseling (C-1) and education (E-2) to facilitate bariatric surgery goals.  Why you need complex carbohydrates: Whole grains and other complex carbohydrates are required to have a healthy diet. Whole grains provide fiber which can help with blood glucose levels and help keep you satiated. Fruits and starchy vegetables provide essential vitamins and minerals required for immune function, eyesight support, brain support, bone density, wound healing and many other functions within the body. According to the current evidenced based  2020-2025 Dietary Guidelines for Americans, complex carbohydrates are part of a healthy eating pattern which is associated with a decreased risk for type 2 diabetes, cancers, and cardiovascular disease.  Encouraged pt to continue to eat balanced meals inclusive of non starchy vegetables 2 times a day 7 days a week Encouraged pt to continue to drink a minium 64 fluid ounces with half being plain water to satisfy proper hydration    Pre-Op Goals Reviewed with the Patient Nutrition: Healthy Eating Behaviors Switch to non-caloric, non-carbonated and non-caffeinated beverages such as  water, unsweetened tea, Crystal Light and zero calorie beverages (aim for 64 oz. per day) Cut out grazing between meals or at night  Find a protein shake you like Eat every 3-5 hours        Eliminate distractions while eating (TV, computer, reading, driving, texting) Take 32-44 minutes to eat a meal  Decrease high sugar foods/decrease high fat/fried foods Eliminate alcoholic beverages Increase protein intake (eggs, fish, chicken, yogurt) before surgery Eat non starchy vegetables 2 times a day 7 days a week Eat complex carbohydrates such as whole grains and fruits   Behavioral Modification: Physical Activity Increase my usual daily activity (use stairs, park farther, etc.) Engage in _______________________  activity  _______ minutes ______ times per week  Other:    _________________________________________________________________     Problem Solving I will think about my usual eating patterns and how to tweak them How can my friends and family support me Barriers to starting my changes Learn and understand appetite verses hunger   Healthy Coping Allow for ___________ activities per week to help me manage stress Reframe negative thoughts I  will keep a picture of someone or something that is my inspiration & look at it daily   Monitoring  Weigh myself once a week  Measure my progress by monitoring how my  clothes fit Keep a food record of what I eat and drink for the next ________ (time period) Take pictures of what I eat and drink for the next ________ (time period) Use an app to count steps/day for the next_______ (time period) Measure my progress such as increased energy and more restful sleep Monitor your acid reflux and bowel habits, are they getting better?   *Goals that are bolded indicate the pt would like to start working towards these  Pre-Op Goals Progress & New Goals Continue: eat every 3-5 hours Continue: physical activity New: use meal ideas handout; continue to aim for 2 or more servings of non-starchy vegetables  Handouts Provided Include  Meal Ideas Bariatric MyPlate  Learning Style & Readiness for Change Teaching method utilized: Visual & Auditory  Demonstrated degree of understanding via: Teach Back  Readiness Level: preparation Barriers to learning/adherence to lifestyle change: nothing identified  RD's Notes for next Visit  Pt progress toward chosen goals  MONITORING & EVALUATION Dietary intake, weekly physical activity, body weight, and pre-op goals in 1 month.   Next Steps  Patient is to return to [facility] in 2 week for next SWL visit.

## 2023-07-01 ENCOUNTER — Ambulatory Visit (HOSPITAL_COMMUNITY): Payer: 59 | Attending: Nurse Practitioner

## 2023-07-01 DIAGNOSIS — R011 Cardiac murmur, unspecified: Secondary | ICD-10-CM | POA: Diagnosis not present

## 2023-07-01 LAB — ECHOCARDIOGRAM COMPLETE
Area-P 1/2: 3.21 cm2
S' Lateral: 3.6 cm

## 2023-07-02 ENCOUNTER — Encounter: Payer: 59 | Attending: Physical Medicine and Rehabilitation | Admitting: Physical Medicine and Rehabilitation

## 2023-07-02 ENCOUNTER — Encounter: Payer: Self-pay | Admitting: Physical Medicine and Rehabilitation

## 2023-07-02 VITALS — BP 130/66 | HR 66 | Ht 68.0 in | Wt 318.0 lb

## 2023-07-02 DIAGNOSIS — Z79891 Long term (current) use of opiate analgesic: Secondary | ICD-10-CM | POA: Diagnosis not present

## 2023-07-02 DIAGNOSIS — G894 Chronic pain syndrome: Secondary | ICD-10-CM | POA: Insufficient documentation

## 2023-07-02 DIAGNOSIS — F17200 Nicotine dependence, unspecified, uncomplicated: Secondary | ICD-10-CM | POA: Diagnosis present

## 2023-07-02 DIAGNOSIS — M1611 Unilateral primary osteoarthritis, right hip: Secondary | ICD-10-CM | POA: Diagnosis not present

## 2023-07-02 DIAGNOSIS — Z5181 Encounter for therapeutic drug level monitoring: Secondary | ICD-10-CM | POA: Diagnosis not present

## 2023-07-02 MED ORDER — OXYCODONE-ACETAMINOPHEN 5-325 MG PO TABS: 1.0000 | ORAL_TABLET | Freq: Two times a day (BID) | ORAL | 0 refills | Status: DC | PRN

## 2023-07-02 MED ORDER — OXYCODONE-ACETAMINOPHEN 5-325 MG PO TABS
1.0000 | ORAL_TABLET | Freq: Two times a day (BID) | ORAL | 0 refills | Status: AC | PRN
Start: 2023-07-02 — End: 2023-08-01

## 2023-07-02 MED ORDER — OXYCODONE-ACETAMINOPHEN 5-325 MG PO TABS: 1.0000 | ORAL_TABLET | Freq: Two times a day (BID) | ORAL | 0 refills | Status: AC | PRN

## 2023-07-02 NOTE — Progress Notes (Unsigned)
Subjective:    Patient ID: [name], female    DOB: [birth year], 47 y.o.   MRN: [id number]  HPI  [name] is a 47 y.o. year old female  who  has a past medical history of Asthma, Hydradenitis, MRSA (methicillin resistant staph aureus) culture positive, and Obesity.   They are presenting to PM&R clinic for follow up related to right hip and left knees .  Plan from last visit:  Chronic pain syndrome I am increasing your chronic pain regimen from Percocet 1 tablet up to twice daily to 1 to 2 tablets up to twice daily.  I have refilled your medication for the next 3 months.   You can also take tylenol up to 325 mg twice daily in addition to percocet.    I will have you follow up with me or Riley Lam in 3 months   Primary osteoarthritis of right hip Acute pain of left knee -     Cane adjustable narrow base quad   I have given you a script for a cane that can be obtained at any medical supply store. Adjust the cane so it is at hip level and carry it in your right hand for stability when walking with left knee pain. Advance the cane as you step with your left foot.    Use ice and voltaren gel on the left knee, and wrap with an ACE wrap. You can discuss bracing with Dr. Denyse Amass at follow up.    Right lumbar radiculopathy Back pain responded very well to PT; keep up the home exercises!   Morbid obesity (HCC) Tobacco use disorder Switch from candy to sugar free gum to help with oral fixations while trying to quit cigarettes. I'll also message your primary doctor to let her know about this.      Interval Hx:  - Therapies: has been biking and doing treadmill at the gym often; has lost about 20 bs since last visit! Also uses the steps 2 flights to get to and from apartment, carrying her 70 month old. Next goal is to 260 lbs, which is her cutoff for hip surgery!   - Follow ups: Following with nutritionist.    - Falls: none; has had a few close calls because of    - DME: Uses  quad cane generally; does not have it today because she left it in her daughter's car. Helps a lot with steps.    - Medications: Dr. Wallace Cullens has been trying to get weight loss injections but insurance has denied them. Current medications are keeping her active, able to exercise and continuing to move toward her goal.    - Other concerns: She is currently smoking 3 cigarettes per day, not even the whole cigarette most times. She says "I have a lot of stuff on my plate and it's stressing me out" as major barrier to quitting. Tried gum and it didn't work for her; she has not tried the patches. She would need to quit if she needs gastric bypass surgery, but does not need to quit for hip surgery.    Pain Inventory Average Pain 9 Pain Right Now 9 My pain is constant, burning, and pinching  In the last 24 hours, has pain interfered with the following? General activity 9 Relation with others 9 Enjoyment of life 9 What TIME of day is your pain at its worst? night Sleep (in general) Poor  Pain is worse with: walking, sitting, standing, and some activites Pain improves  with: heat/ice, medication, and injections Relief from Meds: 10  Family History  Problem Relation Age of Onset   Diabetes Mother    Hypertension Mother    Diabetes Father    Asthma Other    Hyperlipidemia Other    Hypertension Other    Social History   Socioeconomic History   Marital status: Single    Spouse name: Not on file   Number of children: Not on file   Years of education: Not on file   Highest education level: 10th grade  Occupational History   Not on file  Tobacco Use   Smoking status: Some Days    Current packs/day: 0.33    Types: Cigarettes   Smokeless tobacco: Never  Vaping Use   Vaping status: Never Used  Substance and Sexual Activity   Alcohol use: Yes    Comment: once a month at a party beer wine or mixed drinks   Drug use: No   Sexual activity: Yes    Birth control/protection: None  Other  Topics Concern   Not on file  Social History Narrative   Not on file   Social Determinants of Health   Financial Resource Strain: Medium Risk (04/22/2023)   Overall Financial Resource Strain (CARDIA)    Difficulty of Paying Living Expenses: Somewhat hard  Food Insecurity: Food Insecurity Present (04/22/2023)   Hunger Vital Sign    Worried About Running Out of Food in the Last Year: Sometimes true    Ran Out of Food in the Last Year: Sometimes true  Transportation Needs: No Transportation Needs (04/22/2023)   PRAPARE - Administrator, Civil Service (Medical): No    Lack of Transportation (Non-Medical): No  Physical Activity: Not on file  Stress: Not on file  Social Connections: Unknown (04/22/2023)   Social Connection and Isolation Panel [NHANES]    Frequency of Communication with Friends and Family: More than three times a week    Frequency of Social Gatherings with Friends and Family: More than three times a week    Attends Religious Services: Patient declined    Database administrator or Organizations: No    Attends Engineer, structural: Not on file    Marital Status: Patient declined   Past Surgical History:  Procedure Laterality Date   CESAREAN SECTION     x2   TUBAL LIGATION     WISDOM TOOTH EXTRACTION  2011   Past Surgical History:  Procedure Laterality Date   CESAREAN SECTION     x2   TUBAL LIGATION     WISDOM TOOTH EXTRACTION  2011   Past Medical History:  Diagnosis Date   Asthma    Hydradenitis    MRSA (methicillin resistant staph aureus) culture positive    Obesity    There were no vitals taken for this visit.  Opioid Risk Score:   Fall Risk Score:  `1  Depression screen [facility] 2/9     06/09/2023   10:04 AM 04/25/2023    9:49 AM 03/31/2023   10:06 AM 03/21/2023   10:16 AM 03/03/2023    9:31 AM 01/03/2023    9:15 AM 06/22/2021    2:12 PM  Depression screen PHQ 2/9  Decreased Interest 0 0 0 0 3 0 3  Down, Depressed, Hopeless 0 0 0 0 0 0 3   PHQ - 2 Score 0 0 0 0 3 0 6  Altered sleeping  0  0 3 0 2  Tired, decreased energy  0  0 0 0 3  Change in appetite  0  0 3 0 0  Feeling bad or failure about yourself       0 1  Trouble concentrating  0  0 0 0 0  Moving slowly or fidgety/restless  0  0 0 0 0  Suicidal thoughts  0  0 0 0 1  PHQ-9 Score  0  0 9 0 13      Review of Systems  Musculoskeletal:        RT hip LT knee pain  All other systems reviewed and are negative.      Objective:   Physical Exam  Constitution: Appropriate appearance for age. No apparent distress  +Obese Resp: No respiratory distress. No accessory muscle usage. on RA and CTAB Cardio: Well perfused appearance. No peripheral edema. Abdomen: Nondistended. Nontender.   Psych: Appropriate mood and affect. Neuro: AAOx4. No apparent cognitive deficits    Neurologic Exam:   DTRs: Reflexes were 2+ in bilateral achilles, patella, biceps, BR and triceps. Babinsky: flexor responses b/l.   Hoffmans: negative b/l Sensory exam: revealed normal sensation in all dermatomal regions in , bilateral upper extremities, and bilateral lower extremities. She does endorse subjective sensory loss in L anterior shin.  Motor exam: strength 5/5 throughout bilateral upper extremities, right lower extremity, and with exception of LLE knee extension 4/5 Coordination: Fine motor coordination was normal.   Gait: +antalgic gait, offloading L knee/leg - without cane, some catching but overall stable          Assessment & Plan:  SHIAH OAKEY is a 47 y.o. year old female  who  has a past medical history of Asthma, Hydradenitis, MRSA (methicillin resistant staph aureus) culture positive, and Obesity.   They are presenting to PM&R clinic as a new patient for treatment of *** . They were referred by *** . Based on their presentation, *** .  Chronic pain syndrome Encounter for long-term opiate analgesic use Encounter for therapeutic drug monitoring I am refilling your pain  medications at the same dose for the next 3 months; follow up with myself or our nurse practicioner Riley Lam every 3 months for refills.   Primary osteoarthritis of right hip Goal is for surgery; keep up the excellent activity level and weight loss!   Morbid obesity (HCC) Working hard, goal is 260 lbs Insurance denied injections through her PCP She is working with a nutritionist and staying active, has lost significant weight since last visit Goal is to avoid bariatric surgery  Tobacco use disorder Discussed readiness to quit; down to 3 per day and willing to try nicotine patch, encourage ongoing cessation

## 2023-07-02 NOTE — Patient Instructions (Signed)
Chronic pain syndrome Encounter for long-term opiate analgesic use Encounter for therapeutic drug monitoring I am refilling your pain medications at the same dose for the next 3 months; follow up with myself or our nurse practicioner Riley Lam every 3 months for refills.   Primary osteoarthritis of right hip Goal is for surgery; keep up the excellent activity level and weight loss!   Morbid obesity (HCC) Working hard, goal is 260 lbs Insurance denied injections through her PCP She is working with a nutritionist and staying active, has lost significant weight since last visit Goal is to avoid bariatric surgery  Tobacco use disorder Discussed readiness to quit; down to 3 per day and willing to try nicotine patch, encourage ongoing cessation

## 2023-07-10 ENCOUNTER — Encounter (INDEPENDENT_AMBULATORY_CARE_PROVIDER_SITE_OTHER): Payer: Self-pay

## 2023-07-18 ENCOUNTER — Ambulatory Visit: Payer: 59 | Admitting: Nurse Practitioner

## 2023-07-18 NOTE — Telephone Encounter (Signed)
VOB initiated for Zilretta for LEFT knee OA 

## 2023-07-21 NOTE — Telephone Encounter (Signed)
No prior auth required

## 2023-07-22 NOTE — Telephone Encounter (Addendum)
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 Member's responsibilities left over by Medicare can be billed to Medicaid (If eligible). Member will be covered at 100% of the allowable amount.   Knee Injection History: 03/25/23 - Kenalog LEFT 05/07/23 - Zilretta LEFT

## 2023-07-22 NOTE — Telephone Encounter (Signed)
Holding until needed.

## 2023-07-23 ENCOUNTER — Ambulatory Visit: Payer: 59

## 2023-07-29 ENCOUNTER — Encounter: Payer: Self-pay | Admitting: Dietician

## 2023-07-29 ENCOUNTER — Encounter: Payer: 59 | Attending: Surgery | Admitting: Dietician

## 2023-07-29 VITALS — Ht 68.0 in | Wt 316.9 lb

## 2023-07-29 DIAGNOSIS — E669 Obesity, unspecified: Secondary | ICD-10-CM | POA: Insufficient documentation

## 2023-07-29 DIAGNOSIS — Z6841 Body Mass Index (BMI) 40.0 and over, adult: Secondary | ICD-10-CM | POA: Diagnosis not present

## 2023-07-29 DIAGNOSIS — Z713 Dietary counseling and surveillance: Secondary | ICD-10-CM | POA: Insufficient documentation

## 2023-07-29 NOTE — Progress Notes (Signed)
Supervised Weight Loss Visit Bariatric Nutrition Education Appt Start Time:  8:25   End Time: 8:43  Planned surgery: RYGB Pt expectation of surgery:  to be 250-260 lbs  2 out of 6 SWL Appointments    NUTRITION ASSESSMENT   Anthropometrics  Start weight at [facility]: 324.7 lbs (date: 06/09/2023)  Height: 68 in Weight today: 316.9 lb BMI: 48.18 kg/m2     Clinical   Pharmacotherapy: History of weight loss medication used: zepbound has not been approved Medical hx: reflux, asthma Medications: see list   Labs: LDL 126; iron 32; RDW 18.9 Notable signs/symptoms: none noted Any previous deficiencies? No  Lifestyle & Dietary Hx  Pt states she was hurting going to the gym, stating she slowed down. Pt states she is trying new vegetables, stating she is buying more fruit also. Pt states she went for an ultrasound, stating she is getting pains in her stomach. Pt states she is practicing not drinking with her meals, stating she is doing  Pt states her daughter wants her to walk with her in the evenings.  Estimated daily fluid intake: 64 oz Supplements: n/a Current average weekly physical activity: gym every other day for 60 minutes (treadmill or bike); play with grand kids; and up and down steps  24-Hr Dietary Recall First Meal: two eggs and toast Snack: fruit Second Meal: baked chicken with broccoli  Snack: nuts Third Meal: [place] lasagna  Snack:  Beverages: water  Estimated Energy Needs Calories: 1600   NUTRITION DIAGNOSIS  Overweight/obesity ([place]-3.3) related to past poor dietary habits and physical inactivity as evidenced by patient w/ planned RYGB surgery following dietary guidelines for continued weight loss.   NUTRITION INTERVENTION  Nutrition counseling (C-1) and education (E-2) to facilitate bariatric surgery goals.  Why you need complex carbohydrates: Whole grains and other complex carbohydrates are required to have a healthy diet. Whole grains provide fiber which can  help with blood glucose levels and help keep you satiated. Fruits and starchy vegetables provide essential vitamins and minerals required for immune function, eyesight support, brain support, bone density, wound healing and many other functions within the body. According to the current evidenced based 2020-2025 Dietary Guidelines for Americans, complex carbohydrates are part of a healthy eating pattern which is associated with a decreased risk for type 2 diabetes, cancers, and cardiovascular disease.  Encouraged pt to continue to eat balanced meals inclusive of non starchy vegetables 2 times a day 7 days a week Encouraged pt to continue to drink a minium 64 fluid ounces with half being plain water to satisfy proper hydration    Pre-Op Goals Progress & New Goals Continue: eat every 3-5 hours; eat within 1-1.5 hours after waking. Continue: physical activity; start back at the gym (every other day); walk with daughter at night. Continue: use meal ideas handout; continue to aim for 2 or more servings of non-starchy vegetables. New: practice separating fluids from foods; stop 15 minutes before eating and wait 30 minutes after eating before drinking again; sip instead of gulping/chugging.  Handouts Provided Include    Learning Style & Readiness for Change Teaching method utilized: Visual & Auditory  Demonstrated degree of understanding via: Teach Back  Readiness Level: preparation Barriers to learning/adherence to lifestyle change: nothing identified  RD's Notes for next Visit  Pt progress toward chosen goals  MONITORING & EVALUATION Dietary intake, weekly physical activity, body weight, and pre-op goals in 1 month.   Next Steps  Patient is to return to [facility] in 2 week for  next SWL visit.

## 2023-08-01 ENCOUNTER — Ambulatory Visit (INDEPENDENT_AMBULATORY_CARE_PROVIDER_SITE_OTHER): Payer: 59 | Admitting: Nurse Practitioner

## 2023-08-01 VITALS — BP 128/64 | HR 84 | Temp 97.8°F | Ht 68.0 in | Wt 318.0 lb

## 2023-08-01 DIAGNOSIS — Z6841 Body Mass Index (BMI) 40.0 and over, adult: Secondary | ICD-10-CM

## 2023-08-01 DIAGNOSIS — R109 Unspecified abdominal pain: Secondary | ICD-10-CM | POA: Diagnosis not present

## 2023-08-01 DIAGNOSIS — R21 Rash and other nonspecific skin eruption: Secondary | ICD-10-CM | POA: Diagnosis not present

## 2023-08-01 DIAGNOSIS — J4540 Moderate persistent asthma, uncomplicated: Secondary | ICD-10-CM

## 2023-08-01 DIAGNOSIS — M1611 Unilateral primary osteoarthritis, right hip: Secondary | ICD-10-CM | POA: Diagnosis not present

## 2023-08-01 MED ORDER — PREDNISONE 20 MG PO TABS
40.0000 mg | ORAL_TABLET | Freq: Every day | ORAL | 0 refills | Status: DC
Start: 2023-08-01 — End: 2023-09-18

## 2023-08-01 MED ORDER — TRIAMCINOLONE ACETONIDE 0.1 % EX CREA
1.0000 | TOPICAL_CREAM | Freq: Two times a day (BID) | CUTANEOUS | 0 refills | Status: DC
Start: 2023-08-01 — End: 2023-10-31

## 2023-08-01 NOTE — Progress Notes (Signed)
Established Patient Office Visit  Subjective   Patient ID: [name], female    DOB: Jul 17, 1976  Age: 47 y.o. MRN: [id number]  Chief Complaint  Patient presents with   Rash    Rash: Chronic, intermittent.  Most recent flare 3 weeks ago.  Located to her finger and foot.  Is very itchy, skin is dry.  Usually will use moisturizing cream when rash starts to flareup, however reports this time cream did not relieve rash and continued to get worse.  Asthma: Reports with seasonal changes she can have asthma exacerbations.  Currently reports that she feels wheezy and chest tightness.  Is using albuterol inhaler as needed as well as her Trelegy.  Requesting prednisone.  Abdominal pain: Has undergone ultrasound which found evidence for fatty liver but otherwise was negative.  Does report that abdominal pain resolves after evacuation of bowel movement.  Sometimes she feels that she does not fully evacuate.  She will sometimes use stool softeners, fiber supplements.  Reports using a white powder but she does know the name of this (MiraLAX?)  Which seems to help.  No worsening or characteristic change associated with the abdominal pain.  Last bowel movement was today.  Obesity/osteoarthritis: Reports that she has been experiencing more joint pain.  Sometimes she has a hard time completing ADLs such as bathing due to her obesity as well as pain.  She reports she is to have a home health aide who would like to see if she would qualify for one again    ROS: see HPI    Objective:     BP 128/64   Pulse 84   Temp 97.8 F (36.6 C) (Temporal)   Ht 5\' 8"  (1.727 m)   Wt (!) 318 lb (144.2 kg)   SpO2 97%   BMI 48.35 kg/m  BP Readings from Last 3 Encounters:  08/01/23 128/64  07/02/23 130/66  06/06/23 138/82   Wt Readings from Last 3 Encounters:  08/01/23 (!) 318 lb (144.2 kg)  07/29/23 (!) 316 lb 14.4 oz (143.7 kg)  07/02/23 (!) 318 lb (144.2 kg)      Physical Exam Vitals reviewed.   Constitutional:      General: She is not in acute distress.    Appearance: Normal appearance.  HENT:     Head: Normocephalic and atraumatic.  Neck:     Vascular: No carotid bruit.  Cardiovascular:     Rate and Rhythm: Normal rate and regular rhythm.     Pulses: Normal pulses.     Heart sounds: Normal heart sounds.  Pulmonary:     Effort: Pulmonary effort is normal.     Breath sounds: Wheezing present.  Skin:    General: Skin is warm and dry.     Comments: Multiple areas on hand as well as foot of thickened, hyperpigmented skin that has evidence of excoriation/scratch marks.  No opening, no drainage, no bleeding, surrounding skin within normal limits, no redness, no edema.  Not tender to touch  Neurological:     General: No focal deficit present.     Mental Status: She is alert and oriented to person, place, and time.  Psychiatric:        Mood and Affect: Mood normal.        Behavior: Behavior normal.        Judgment: Judgment normal.      No results found for any visits on 08/01/23.    The 10-year ASCVD risk score (Arnett DK, et  al., 2019) is: 2.5%    Assessment & Plan:   Problem List Items Addressed This Visit       Respiratory   Moderate persistent asthma without complication    Chronic, currently is wheezing, no acute respiratory distress, vital signs stable, SpO2 97% on room air Patient [name] to continue zing albuterol as needed as well as daily Trelegy. Treat with short prednisone burst 40 mg daily x 5 days.  Patient educated to use her Percocet for pain management if needed and to avoid NSAIDs while taking prednisone.  We also discussed possible side effects and what to do if these were to occur.  She reports her understanding.      Relevant Medications   predniSONE (DELTASONE) 20 MG tablet     Musculoskeletal and Integument   Primary osteoarthritis of right hip    Chronic Continue to follow with pain management: Pain is getting in the way of her ability to  complete ADLs per her report Refer to home health for them to evaluate her to see if she would qualify for home health aide      Relevant Medications   predniSONE (DELTASONE) 20 MG tablet   Other Relevant Orders   Ambulatory referral to Home Health   Rash - Primary    Chronic, currently flared Appears consistent with eczema Patient prescribed oral prednisone to help with her asthma this may also help with her rash.  If not she can trial topical triamcinolone cream as well.  We discussed limiting use to twice daily application x 14 days.  We discussed use of daily moisturizer twice a day consistently.      Relevant Medications   triamcinolone cream (KENALOG) 0.1 %     Other   Class 3 severe obesity due to excess calories with body mass index (BMI) of 45.0 to 49.9 in adult [facility])    Chronic Patient is working on weight loss.  Has lost 5 pounds over the last couple of months.  She is congratulated on doing so. She has comorbid osteoarthritis, managed with pain management Pain does result in difficulty completing ADLs For now encourage patient to continue focusing on lifestyle modification aimed at weight loss and will refer to home health to see if they can evaluate for whether or not she may qualify for home health aide to assist with ADLs      Relevant Orders   Ambulatory referral to Home Health   Abdominal pain    Intermittent, most likely related to constipation Recommend daily MiraLAX use.  If stools become loose reduce frequency of MiraLAX use and/or stop MiraLAX but start daily Metamucil. Follow-up in 3 months, she may also be a candidate for guanylate cyclase-C agonists such as linzess or trulance. Discuss further in 3 months.       Return in about 3 months (around 10/31/2023) for F/U with [name].    Elenore Paddy, NP

## 2023-08-01 NOTE — Assessment & Plan Note (Signed)
Chronic Patient is working on weight loss.  Has lost 5 pounds over the last couple of months.  She is congratulated on doing so. She has comorbid osteoarthritis, managed with pain management Pain does result in difficulty completing ADLs For now encourage patient to continue focusing on lifestyle modification aimed at weight loss and will refer to home health to see if they can evaluate for whether or not she may qualify for home health aide to assist with ADLs

## 2023-08-01 NOTE — Assessment & Plan Note (Signed)
Intermittent, most likely related to constipation Recommend daily MiraLAX use.  If stools become loose reduce frequency of MiraLAX use and/or stop MiraLAX but start daily Metamucil. Follow-up in 3 months, she may also be a candidate for guanylate cyclase-C agonists such as linzess or trulance. Discuss further in 3 months.

## 2023-08-01 NOTE — Patient Instructions (Addendum)
Miralax: laxative, Can be used every day to maintain stool regularity Metamcil: this is fiber. Can be taken every day as well to maintain stool regularity  You can take both of the above to treat constipation or you can take one or the other.

## 2023-08-01 NOTE — Assessment & Plan Note (Signed)
Chronic, currently is wheezing, no acute respiratory distress, vital signs stable, SpO2 97% on room air Patient [name] to continue zing albuterol as needed as well as daily Trelegy. Treat with short prednisone burst 40 mg daily x 5 days.  Patient educated to use her Percocet for pain management if needed and to avoid NSAIDs while taking prednisone.  We also discussed possible side effects and what to do if these were to occur.  She reports her understanding.

## 2023-08-01 NOTE — Assessment & Plan Note (Addendum)
Chronic Continue to follow with pain management: Pain is getting in the way of her ability to complete ADLs per her report Refer to home health for them to evaluate her to see if she would qualify for home health aide

## 2023-08-01 NOTE — Assessment & Plan Note (Signed)
Chronic, currently flared Appears consistent with eczema Patient prescribed oral prednisone to help with her asthma this may also help with her rash.  If not she can trial topical triamcinolone cream as well.  We discussed limiting use to twice daily application x 14 days.  We discussed use of daily moisturizer twice a day consistently.

## 2023-08-24 ENCOUNTER — Other Ambulatory Visit: Payer: Self-pay | Admitting: Family Medicine

## 2023-08-24 DIAGNOSIS — G8929 Other chronic pain: Secondary | ICD-10-CM

## 2023-08-26 ENCOUNTER — Encounter: Payer: Self-pay | Admitting: Dietician

## 2023-08-26 ENCOUNTER — Encounter: Payer: 59 | Attending: Surgery | Admitting: Dietician

## 2023-08-26 VITALS — Ht 68.0 in | Wt 316.0 lb

## 2023-08-26 DIAGNOSIS — Z6841 Body Mass Index (BMI) 40.0 and over, adult: Secondary | ICD-10-CM | POA: Insufficient documentation

## 2023-08-26 DIAGNOSIS — Z713 Dietary counseling and surveillance: Secondary | ICD-10-CM | POA: Diagnosis not present

## 2023-08-26 DIAGNOSIS — E669 Obesity, unspecified: Secondary | ICD-10-CM | POA: Insufficient documentation

## 2023-08-26 NOTE — Progress Notes (Signed)
Supervised Weight Loss Visit Bariatric Nutrition Education Appt Start Time:  8:36   End Time: 8:57  Planned surgery: RYGB Pt expectation of surgery:  to be 250-260 lbs  3 out of 6 SWL Appointments    NUTRITION ASSESSMENT   Anthropometrics  Start weight at [facility]: 324.7 lbs (date: 06/09/2023)  Height: 68 in Weight today: 316.0 lb BMI: 48.05 kg/m2     Clinical   Pharmacotherapy: History of weight loss medication used: zepbound has not been approved Medical hx: reflux, asthma Medications: see list   Labs: LDL 126; iron 32; RDW 18.9 Notable signs/symptoms: none noted Any previous deficiencies? No  Lifestyle & Dietary Hx  Pt states she drinks mushroom coffee, stating her intestines do not hurt now. Pt states she adds a lot of sugar. Pt is agreeable to use a sugar free sweetener. Pt states she can't get as much water in when she sips, stating she would be able to get more when she gulps. Pt states she doesn't like plain water, and gulps it to get it in. Pt states her knee is going out again, stating it has affected her physical activity. Pt states she needs to get a shot in her knee. Pt states she gets her activity going up and down the steps. Pt states she started back smoking two days ago, stating she had quit a couple of months.  Estimated daily fluid intake: 64 oz Supplements: n/a Current average weekly physical activity: gym every other day for 60 minutes (treadmill or bike); play with grand kids; and up and down steps  24-Hr Dietary Recall First Meal: two eggs and toast Snack: fruit Second Meal: baked chicken with broccoli  Snack: nuts Third Meal: [place] lasagna  Snack:  Beverages: water  Estimated Energy Needs Calories: 1600  NUTRITION DIAGNOSIS  Overweight/obesity ([place]-3.3) related to past poor dietary habits and physical inactivity as evidenced by patient w/ planned RYGB surgery following dietary guidelines for continued weight loss.  NUTRITION INTERVENTION   Nutrition counseling (C-1) and education (E-2) to facilitate bariatric surgery goals.  Why you need complex carbohydrates: Whole grains and other complex carbohydrates are required to have a healthy diet. Whole grains provide fiber which can help with blood glucose levels and help keep you satiated. Fruits and starchy vegetables provide essential vitamins and minerals required for immune function, eyesight support, brain support, bone density, wound healing and many other functions within the body. According to the current evidenced based 2020-2025 Dietary Guidelines for Americans, complex carbohydrates are part of a healthy eating pattern which is associated with a decreased risk for type 2 diabetes, cancers, and cardiovascular disease.  Encouraged pt to continue to eat balanced meals inclusive of non starchy vegetables 2 times a day 7 days a week Encouraged pt to continue to drink a minium 64 fluid ounces with half being plain water to satisfy proper hydration   Encouraged patient to honor their body's internal hunger and fullness cues.  Throughout the day, check in mentally and rate hunger. Stop eating when satisfied not full regardless of how much food is left on the plate.  Get more if still hungry 20-30 minutes later.  The key is to honor satisfaction so throughout the meal, rate fullness factor and stop when comfortably satisfied not physically full. The key is to honor hunger and fullness without any feelings of guilt or shame.  Pay attention to what the internal cues are, rather than any external factors. This will enhance the confidence you have in listening to  your own body and following those internal cues enabling you to increase how often you eat when you are hungry not out of appetite and stop when you are satisfied not full.   Pre-Op Goals Progress & New Goals Continue: eat every 3-5 hours; eat within 1-1.5 hours after waking. Continue: physical activity; start back at the gym (every other  day); walk with daughter at night. Continue: use meal ideas handout; continue to aim for 2 or more servings of non-starchy vegetables. Continue: practice separating fluids from foods; stop 15 minutes before eating and wait 30 minutes after eating before drinking again; sip instead of gulping/chugging. New: aim for feeling of satisfaction to avoid fullness; slow down eating; take about 20-30 minutes to eat; chew well New: quit smoking again  Handouts Provided Include    Learning Style & Readiness for Change Teaching method utilized: Visual & Auditory  Demonstrated degree of understanding via: Teach Back  Readiness Level: preparation Barriers to learning/adherence to lifestyle change: nothing identified  RD's Notes for next Visit  Pt progress toward chosen goals  MONITORING & EVALUATION Dietary intake, weekly physical activity, body weight, and pre-op goals in 1 month.   Next Steps  Patient is to return to [facility] in 2 week for next SWL visit.

## 2023-08-29 ENCOUNTER — Ambulatory Visit: Payer: 59 | Admitting: Nurse Practitioner

## 2023-09-01 ENCOUNTER — Ambulatory Visit (INDEPENDENT_AMBULATORY_CARE_PROVIDER_SITE_OTHER): Payer: 59

## 2023-09-01 ENCOUNTER — Ambulatory Visit (INDEPENDENT_AMBULATORY_CARE_PROVIDER_SITE_OTHER): Payer: 59 | Admitting: Family Medicine

## 2023-09-01 ENCOUNTER — Other Ambulatory Visit: Payer: Self-pay

## 2023-09-01 ENCOUNTER — Telehealth: Payer: Self-pay

## 2023-09-01 VITALS — BP 138/84 | HR 64 | Ht 68.0 in

## 2023-09-01 DIAGNOSIS — M25561 Pain in right knee: Secondary | ICD-10-CM

## 2023-09-01 DIAGNOSIS — G8929 Other chronic pain: Secondary | ICD-10-CM | POA: Diagnosis not present

## 2023-09-01 DIAGNOSIS — M1712 Unilateral primary osteoarthritis, left knee: Secondary | ICD-10-CM

## 2023-09-01 DIAGNOSIS — M25562 Pain in left knee: Secondary | ICD-10-CM

## 2023-09-01 MED ORDER — TRIAMCINOLONE ACETONIDE 32 MG IX SRER
32.0000 mg | Freq: Once | INTRA_ARTICULAR | Status: AC
  Administered 2023-09-01: 32 mg via INTRA_ARTICULAR

## 2023-09-01 NOTE — Telephone Encounter (Signed)
Patient called stating pharmacy is out of her Oxycodone/APAP, please send to [facility].

## 2023-09-01 NOTE — Patient Instructions (Signed)
Thank you for coming in today.   Please get an Xray today before you leave   You received an injection today. Seek immediate medical attention if the joint becomes red, extremely painful, or is oozing fluid.

## 2023-09-01 NOTE — Progress Notes (Signed)
Rubin Payor, PhD, LAT, ATC acting as a scribe for Clementeen Graham, MD.  [name] is a 47 y.o. female who presents to [facility] today for f/u L knee pain. Pt was last seen by Dr. Denyse Amass on 05/07/23 and was given a L knee Zilretta injection.  Today, pt reports L knee pain just returned last week.  She overall is doing better.  She is actively working with a Museum/gallery exhibitions officer for weight loss in preparation of a potential bariatric surgery in a few months.  She is losing about 4 pounds a month currently.  Additionally she notes some contralateral right knee pain that she thinks is also probably related to arthritis.  Dx imaging: 04/20/23 L knee MRI 03/25/23 L knee XR  Pertinent review of systems: No fevers or chills  Relevant historical information: Asthma and obesity.   Exam:  BP 138/84   Pulse 64   Ht 5\' 8"  (1.727 m)   SpO2 96%   BMI 48.05 kg/m  General: Well Developed, well nourished, and in no acute distress.   MSK: Left knee mild effusion normal motion with crepitation.    Lab and Radiology Results   Zilretta injection left knee Procedure: Real-time Ultrasound Guided Injection of left knee joint superior lateral patellar space Device: Philips Affiniti 50G Images permanently stored and available for review in PACS Verbal informed consent obtained.  Discussed risks and benefits of procedure. Warned about infection, hyperglycemia bleeding, damage to structures among others. Patient expresses understanding and agreement Time-out conducted.   Noted no overlying erythema, induration, or other signs of local infection.   Skin prepped in a sterile fashion.   Local anesthesia: Topical Ethyl chloride.   With sterile technique and under real time ultrasound guidance: Zilretta 32 mg injected into knee joint. Fluid seen entering the joint capsule.   Completed without difficulty   Advised to call if fevers/chills, erythema, induration,  drainage, or persistent bleeding.   Images permanently stored and available for review in the ultrasound unit.  Impression: Technically successful ultrasound guided injection.  Lot number: [id number]  X-ray images right knee obtained today personally independently interpreted Mild tricompartmental DJD.  No acute fractures. Await formal radiology review  Assessment and Plan: 47 y.o. female with left knee pain due to exacerbation of DJD.  Plan for repeat Zilretta injection.  This has been working pretty well recently.  She is currently working on weight loss which will be essential.  Ultimately she would likely require total knee replacement.  BMI needs to be under 40 currently is 48.  She is also in the process of doing the dietary nutrition trial for 6 months prior to bariatric surgery.  She notes some right knee pain as well this is due to DJD.  Could proceed with conventional steroid injections or in the future we can try to authorize Zilretta if needed.   PDMP not reviewed this encounter. Orders Placed This Encounter  Procedures   [place] LIMITED JOINT SPACE STRUCTURES LOW LEFT(NO LINKED CHARGES)    Order Specific Question:   Reason for Exam (SYMPTOM  OR DIAGNOSIS REQUIRED)    Answer:   left knee pain    Order Specific Question:   Preferred imaging location?    Answer:   [facility] Knee AP/LAT W/[facility] Right    Standing Status:   Future    Number of Occurrences:   1    Standing Expiration Date:   10/02/2023  Order Specific Question:   Reason for Exam (SYMPTOM  OR DIAGNOSIS REQUIRED)    Answer:   right knee pain    Order Specific Question:   Preferred imaging location?    Answer:   [facility]    Order Specific Question:   Is patient pregnant?    Answer:   No   Meds ordered this encounter  Medications   Triamcinolone Acetonide (ZILRETTA) intra-articular injection 32 mg     Discussed warning signs or symptoms. Please see discharge  instructions. Patient expresses understanding.   The above documentation has been reviewed and is accurate and complete Clementeen Graham, M.D.

## 2023-09-02 MED ORDER — OXYCODONE-ACETAMINOPHEN 5-325 MG PO TABS: 1.0000 | ORAL_TABLET | Freq: Two times a day (BID) | ORAL | 0 refills | Status: DC | PRN

## 2023-09-03 ENCOUNTER — Encounter: Payer: Self-pay | Admitting: Physician Assistant

## 2023-09-05 ENCOUNTER — Other Ambulatory Visit: Payer: Self-pay | Admitting: Obstetrics and Gynecology

## 2023-09-05 DIAGNOSIS — D219 Benign neoplasm of connective and other soft tissue, unspecified: Secondary | ICD-10-CM

## 2023-09-05 DIAGNOSIS — N939 Abnormal uterine and vaginal bleeding, unspecified: Secondary | ICD-10-CM

## 2023-09-12 ENCOUNTER — Other Ambulatory Visit: Payer: Self-pay | Admitting: Allergy & Immunology

## 2023-09-12 ENCOUNTER — Other Ambulatory Visit: Payer: Self-pay | Admitting: Family

## 2023-09-15 NOTE — Progress Notes (Signed)
Right knee x-ray shows mild arthritis.

## 2023-09-17 ENCOUNTER — Ambulatory Visit: Payer: 59 | Admitting: Obstetrics and Gynecology

## 2023-09-17 VITALS — BP 154/86 | HR 79 | Ht 68.0 in | Wt 313.0 lb

## 2023-09-17 DIAGNOSIS — N939 Abnormal uterine and vaginal bleeding, unspecified: Secondary | ICD-10-CM | POA: Diagnosis not present

## 2023-09-17 DIAGNOSIS — Z3041 Encounter for surveillance of contraceptive pills: Secondary | ICD-10-CM

## 2023-09-17 NOTE — Progress Notes (Signed)
   GYNECOLOGY PROGRESS NOTE  History:  47 y.o. G3P2010 presents to [facility] office today for follow up   Having monthly cycle, lasts 3-4 days,  not too heavy. . Reports no cramping. No vaginal or pelvic complaints. Would like to continue current regimen The following portions of the patient's history were reviewed and updated as appropriate: allergies, current medications, past family history, past medical history, past social history, past surgical history and problem list. Last pap smear on 01/03/23 was normal, neg HRHPV.  Health Maintenance Due  Topic Date Due   COVID-19 Vaccine (1) Never done   DTaP/Tdap/Td (1 - Tdap) Never done   Medicare Annual Wellness (AWV)  02/22/2020   Colonoscopy  Never done   INFLUENZA VACCINE  Never done     Review of Systems:  Pertinent items are noted in HPI.   Objective:  Physical Exam Blood pressure (!) 154/86, pulse 79, height 5\' 8"  (1.727 m), weight (!) 313 lb (142 kg), last menstrual period 09/02/2023. VS reviewed, nursing note reviewed,  Constitutional: well developed, well nourished, no distress HEENT: normocephalic Pulm/chest wall: normal effort Breast Exam: deferred Abdomen: soft Neuro: alert and oriented  Skin: warm, dry Psych: affect normal Pelvic exam: deferred  Assessment & Plan:  1. Abnormal uterine bleeding 2. Surveillance for birth control, oral contraceptives Refill was placed 10/11 for birth control , continue current regimen  Return in 3 months for annual exam or sooner if needed  Albertine Grates, FNP

## 2023-09-17 NOTE — Progress Notes (Signed)
Pt presents for f/u. No concerns, requesting refill.

## 2023-09-18 ENCOUNTER — Ambulatory Visit (INDEPENDENT_AMBULATORY_CARE_PROVIDER_SITE_OTHER): Payer: 59 | Admitting: Allergy & Immunology

## 2023-09-18 ENCOUNTER — Other Ambulatory Visit: Payer: Self-pay

## 2023-09-18 ENCOUNTER — Encounter: Payer: Self-pay | Admitting: Allergy & Immunology

## 2023-09-18 VITALS — BP 138/88 | HR 74 | Temp 98.0°F | Resp 18 | Ht 68.0 in | Wt 314.4 lb

## 2023-09-18 DIAGNOSIS — T7800XD Anaphylactic reaction due to unspecified food, subsequent encounter: Secondary | ICD-10-CM

## 2023-09-18 DIAGNOSIS — J3089 Other allergic rhinitis: Secondary | ICD-10-CM

## 2023-09-18 DIAGNOSIS — J4540 Moderate persistent asthma, uncomplicated: Secondary | ICD-10-CM | POA: Diagnosis not present

## 2023-09-18 NOTE — Patient Instructions (Addendum)
1. Moderate persistent asthma, uncomplicated - Lung testing looked worse today. - We are going to get some labs to start some asthma injections.  - Daily controller medication(s): Trelegy 200/62.5/25 one puff once daily - Prior to physical activity: albuterol 2 puffs 10-15 minutes before physical activity. - Rescue medications: albuterol 4 puffs every 4-6 hours as needed - Asthma control goals:  * Full participation in all desired activities (may need albuterol before activity) * Albuterol use two time or less a week on average (not counting use with activity) * Cough interfering with sleep two time or less a month * Oral steroids no more than once a year * No hospitalizations  2. Anaphylactic shock due to food (shellfish) - EpiPen refilled.    3. Perennial and seasonal allergic rhinitis (grasses, weeds, ragweed, trees, molds, cat, dog, cockroach, and dust mite) - Continue with fluticasone nasal spray two sprays per nostril daily EVERY DAY (brown bottle).  - Continue with levocetirizine 5mg  tablet once daily.  - Continue montelukast (Singulair) 10mg  daily.    4. Suppurativa hiadrenitis - Continue following with Dr. Roseanne Reno. - I will send my note to her to keep her in the loop.  - Take pics so you can show her when you see her.  5. Return in about 6 months (around 10/31/2021).   Please inform us of any Emergency Department visits, hospitalizations, or changes in symptoms. Call us before going to the ED for breathing or allergy symptoms since we might be able to fit you in for a sick visit. Feel free to contact us anytime with any questions, problems, or concerns.  It was a pleasure to see you and your family again today!  Websites that have reliable patient information: 1. American Academy of Asthma, Allergy, and Immunology: www.aaaai.org 2. Food Allergy Research and Education (FARE): foodallergy.org 3. Mothers of Asthmatics: http://www.asthmacommunitynetwork.org 4. American  College of Allergy, Asthma, and Immunology: www.acaai.org   COVID-19 Vaccine Information can be found at: [email or web address] For questions related to vaccine distribution or appointments, please email vaccine@ [email or web address] or call [phone].   We realize that you might be concerned about having an allergic reaction to the COVID19 vaccines. To help with that concern, WE ARE OFFERING THE COVID19 VACCINES IN OUR OFFICE! Ask the front desk for dates!     "Like" [place] on Facebook and Instagram for our latest updates!      A healthy democracy works best when [employer] participate! Make sure you are registered to vote! If you have moved or changed any of your contact information, you will need to get this updated before voting!  In some cases, you MAY be able to register to vote online: [email or web address]

## 2023-09-18 NOTE — Telephone Encounter (Signed)
Last Zilretta inj 09/01/23 Can consider repeat injection on or after 11/25/23.

## 2023-09-18 NOTE — Progress Notes (Addendum)
FOLLOW UP  Date of Service/Encounter:  09/18/23   Assessment:   Seasonal and perennial allergic rhinitis (grasses, weeds, ragweed, trees, molds, cat, dog, cockroach, and dust mite)   Moderate persistent asthma - with acute exacerbation (starting prednisone)   Anaphylactic shock due to food (shellfish)   Hidradenitis suppurativa    Plan/Recommendations:    1. Moderate persistent asthma, uncomplicated - Lung testing looked worse today. - Prednisone taper started.  - We are going to get some labs to start some asthma injections.  - Daily controller medication(s): Trelegy 200/62.5/25 one puff once daily - Prior to physical activity: albuterol 2 puffs 10-15 minutes before physical activity. - Rescue medications: albuterol 4 puffs every 4-6 hours as needed - Asthma control goals:  * Full participation in all desired activities (may need albuterol before activity) * Albuterol use two time or less a week on average (not counting use with activity) * Cough interfering with sleep two time or less a month * Oral steroids no more than once a year * No hospitalizations  2. Anaphylactic shock due to food (shellfish) - EpiPen refilled.    3. Perennial and seasonal allergic rhinitis (grasses, weeds, ragweed, trees, molds, cat, dog, cockroach, and dust mite) - Continue with fluticasone nasal spray two sprays per nostril daily EVERY DAY (brown bottle).  - Continue with levocetirizine 5mg  tablet once daily.  - Continue montelukast (Singulair) 10mg  daily.    4. Suppurativa hiadrenitis - Continue following with Dr. Roseanne Reno. - I will send my note to her to keep her in the loop.  - Take pics so you can show her when you see her.  5. Return in about 6 months (around 10/31/2021).  Subjective:   [name] is a 47 y.o. female presenting today for follow up of  Chief Complaint  Patient presents with   Asthma    6 mth f/u - [facility]So    Wheezing    X 2 wks    [name] has  a history of the following: Patient Active Problem List   Diagnosis Date Noted   Rash 08/01/2023   Abdominal pain 06/06/2023   Iron deficiency 06/06/2023   Murmur 06/06/2023   Fibroids 05/13/2023   Abnormal uterine bleeding 04/25/2023   Chronic pain of left knee 03/31/2023   Class 3 severe obesity due to excess calories with body mass index (BMI) of 45.0 to 49.9 in adult [facility]) 03/21/2023   Hidradenitis suppurativa 03/21/2023   Right lumbar radiculopathy 03/03/2023   Morbid obesity (HCC) 03/03/2023   Chronic pain syndrome 03/03/2023   Hip arthritis 02/25/2023   Primary osteoarthritis of right hip 01/21/2023   Graves disease 07/06/2019   Hyperthyroidism 07/06/2019   Tobacco use disorder 05/26/2019   Seasonal and perennial allergic rhinitis 03/25/2018   Perennial and seasonal allergic rhinitis 08/19/2017   Moderate persistent asthma without complication 08/19/2017    History obtained from: chart review and patient.  Discussed the use of AI scribe software for clinical note transcription with the patient and/or guardian, who gave verbal consent to proceed.  [name] is a 47 y.o. female presenting for a follow up visit.  She was last seen in April 2024.  At that time, her lung testing looks stable.  We continue with Trelegy 200 mcg 1 puff once daily as well as albuterol as needed.  She continue to avoid shellfish.  For her rhinitis, we continue with Flonase as well as levocetirizine and montelukast.  She continue to follow with Dr. Roseanne Reno  for her suppurativa hidradenitis.  She was on Cosentyx at that time.  Since last visit, she has done well.  Asthma/Respiratory Symptom History: She remains on the Trelegy one puff once daily. She did get some prednisone in September. Otherwise she has bene doing well. She has been wheezing nightly.  She estimates that she has been using her albuterol nebulizer every night and every morning. This has been going on for two weeks. Every October she tends to  get this way. She is open to starting asthma injections to help with her control. She is willing to get some labs today.   Allergic Rhinitis Symptom History: She remains on the fluticasone and the azelastine. She has been on the levocetirizine and the montelukast. Overall her frequency of sinus infections is much better than it has been in the past. She feels that her allergic rhinitis is under pretty good control.   Food Allergy Symptom History: She continues to avoid shellfish. She is not interested in retesting at all today. EpiPen is up to date. She has not had any accidental ingestions.   Skin Symptom History: She has some lesions on her fingers and her feet.  She remains on the Cosentyx. She is not sure that this is working as well as she had hoped. She continues to a fairly severe rash on her right breast. She has an appointment with Dr. Roseanne Reno coming up nearly next month.   She is continuing to work on weight loss. She needs a hip replacement but this cannot happen until she loses weight.   Otherwise, there have been no changes to her past medical history, surgical history, family history, or social history.    Review of systems otherwise negative other than that mentioned in the HPI.    Objective:   Blood pressure 138/88, pulse 74, temperature 98 F (36.7 C), temperature source Temporal, resp. rate 18, height 5\' 8"  (1.727 m), weight (!) 314 lb 6.4 oz (142.6 kg), last menstrual period 09/02/2023, SpO2 97%. Body mass index is 47.8 kg/m.    Physical Exam Vitals reviewed.  Constitutional:      Appearance: She is well-developed.     Comments: Lost a lot of weight since I saw her last time.   HENT:     Head: Normocephalic and atraumatic.     Right Ear: Tympanic membrane, ear canal and external ear normal.     Left Ear: Tympanic membrane, ear canal and external ear normal.     Nose: No nasal deformity, septal deviation, mucosal edema or rhinorrhea.     Right Turbinates:  Enlarged, swollen and pale.     Left Turbinates: Enlarged, swollen and pale.     Right Sinus: No maxillary sinus tenderness or frontal sinus tenderness.     Left Sinus: No maxillary sinus tenderness or frontal sinus tenderness.     Comments: No nasal polyps noted.     Mouth/Throat:     Lips: Pink.     Mouth: Mucous membranes are moist. Mucous membranes are not pale and not dry.     Pharynx: Uvula midline.     Comments: Cobblestoning present in the posterior oropharynx. Eyes:     General: Allergic shiner present.        Right eye: No discharge.        Left eye: No discharge.     Conjunctiva/sclera: Conjunctivae normal.     Right eye: Right conjunctiva is not injected. No chemosis.    Left eye: Left conjunctiva is not  injected. No chemosis.    Pupils: Pupils are equal, round, and reactive to light.  Cardiovascular:     Rate and Rhythm: Normal rate and regular rhythm.     Heart sounds: Normal heart sounds.  Pulmonary:     Effort: Pulmonary effort is normal. No tachypnea, accessory muscle usage or respiratory distress.     Breath sounds: Normal breath sounds. No wheezing, rhonchi or rales.     Comments: Moving air well in all lung fields. No increased work of breathing noted.  Chest:     Chest wall: No tenderness.  Lymphadenopathy:     Cervical: No cervical adenopathy.  Skin:    General: Skin is warm.     Capillary Refill: Capillary refill takes less than 2 seconds.     Coloration: Skin is not pale.     Findings: Rash present. No abrasion, erythema or petechiae. Rash is not papular, urticarial or vesicular.     Comments: She has some hyperkeratosis in her right breast, but there is no oozing as there was last time.   Neurological:     Mental Status: She is alert.  Psychiatric:        Behavior: Behavior is cooperative.      Diagnostic studies:    Spirometry: results abnormal (FEV1: 1.66/59%, FVC: 2.47/70%, FEV1/FVC: 67%).    Spirometry consistent with mixed obstructive and  restrictive disease. This is worse than previous spirometric findings.   Allergy Studies: none         Malachi Bonds, MD  [facility]

## 2023-09-22 ENCOUNTER — Other Ambulatory Visit: Payer: Self-pay | Admitting: Allergy & Immunology

## 2023-09-22 LAB — ANCA TITERS
Atypical pANCA: <1:20 {titer}
C-ANCA: <1:20 {titer}
P-ANCA: <1:20 {titer}

## 2023-09-22 LAB — ALPHA-1-ANTITRYPSIN: A-1 Antitrypsin: 218 mg/dL — ABNORMAL HIGH (ref 101–187)

## 2023-09-22 LAB — ASPERGILLUS PRECIPITINS
A.Fumigatus #1 Abs: NEGATIVE
Aspergillus Flavus Antibodies: NEGATIVE
Aspergillus Niger Antibodies: NEGATIVE
Aspergillus glaucus IgG: NEGATIVE
Aspergillus nidulans IgG: NEGATIVE
Aspergillus terreus IgG: NEGATIVE

## 2023-09-23 ENCOUNTER — Telehealth: Payer: Self-pay | Admitting: *Deleted

## 2023-09-23 ENCOUNTER — Telehealth: Payer: Self-pay | Admitting: Allergy & Immunology

## 2023-09-23 ENCOUNTER — Encounter: Payer: Self-pay | Admitting: Allergy & Immunology

## 2023-09-23 MED ORDER — PREDNISONE 10 MG PO TABS: ORAL_TABLET | ORAL | 0 refills | Status: DC

## 2023-09-23 NOTE — Telephone Encounter (Signed)
Can you please confirm prednisone as it is not listed on avs

## 2023-09-23 NOTE — Telephone Encounter (Signed)
Called patient and advised approval and submit to [facility] for Tezspire. Patient wants to get same in clinic admin so will reach out once delivery set to make appt to start therapy

## 2023-09-23 NOTE — Telephone Encounter (Signed)
-----   Message from Alfonse Spruce sent at 09/23/2023  5:41 AM EDT ----- Gildardo Cranker start?

## 2023-09-23 NOTE — Addendum Note (Signed)
Addended by: Alfonse Spruce on: 09/23/2023 04:00 PM   Modules accepted: Orders

## 2023-09-23 NOTE — Telephone Encounter (Signed)
[name] called in and states she was supposed to have Prednisone called in and it wasn't.  [facility] [address]

## 2023-09-24 ENCOUNTER — Telehealth: Payer: Self-pay | Admitting: Allergy & Immunology

## 2023-09-24 NOTE — Telephone Encounter (Signed)
Awesome - thank you!  ° °Alexandria Shiflett, MD °[facility] ° °

## 2023-09-24 NOTE — Telephone Encounter (Signed)
Patient called and stated that she was waiting for her prescription for Tezpire to be sent to [facility]. I read notes from yesterday that it was being worked on yesterday with Tammy. I told patient that I can give her Tammys number as she is the person that works with biologics and will be better able to assist her with this. Patient said forget it, I have a sick child here. She said have a good day bye and hung up. She didn't give me a chance to give her Tammys number or say goodbye or anything.

## 2023-09-24 NOTE — Telephone Encounter (Signed)
Sorry I thought I took care of this with Lachelle yesterday. I sent it in.   Malachi Bonds, MD [facility]

## 2023-09-26 ENCOUNTER — Encounter: Payer: Self-pay | Admitting: Dietician

## 2023-09-26 ENCOUNTER — Encounter: Payer: 59 | Attending: Surgery | Admitting: Dietician

## 2023-09-26 VITALS — Ht 68.0 in | Wt 315.4 lb

## 2023-09-26 DIAGNOSIS — Z713 Dietary counseling and surveillance: Secondary | ICD-10-CM | POA: Insufficient documentation

## 2023-09-26 DIAGNOSIS — E669 Obesity, unspecified: Secondary | ICD-10-CM | POA: Insufficient documentation

## 2023-09-26 DIAGNOSIS — Z6841 Body Mass Index (BMI) 40.0 and over, adult: Secondary | ICD-10-CM | POA: Diagnosis not present

## 2023-09-26 NOTE — Progress Notes (Signed)
Supervised Weight Loss Visit Bariatric Nutrition Education Appt Start Time:  9:20   End Time: 9:39  Planned surgery: RYGB Pt expectation of surgery:  to be 250-260 lbs  4 out of 6 SWL Appointments    NUTRITION ASSESSMENT   Anthropometrics  Start weight at [facility]: 324.7 lbs (date: 06/09/2023)  Height: 68 in Weight today: 315.4 lb BMI: 47.96 kg/m2     Clinical   Pharmacotherapy: History of weight loss medication used: zepbound has not been approved Medical hx: reflux, asthma Medications: see list   Labs: LDL 126; iron 32; RDW 18.9 Notable signs/symptoms: none noted Any previous deficiencies? No  Lifestyle & Dietary Hx  Pt states she has cut out all sugar. Pt states she has the eating part down pat and the protein part down pat. Pt states she is getting 64 oz. Pt states she is trying to quit smoking, stating she has not had a cigarette for 2 days. Pt states she needs bariatric surgery to lose weight for hip surgery. Pt states she wants to be able to play with her grandchildren. Pt states she walked for a couple of hours trick or treating with her grandchildren, stating she did not eat any candy. Pt states she is practicing not drinking with her meals, stating that it is a routine for her now. Pt states she is getting a treadmill put in her house, to help with stress so she will not turn to smoking.  Estimated daily fluid intake: 64 oz Supplements: Iron Current average weekly physical activity: gym every other day for 60 minutes (treadmill or bike); play with grand kids; and up and down steps  24-Hr Dietary Recall First Meal: two eggs and toast Snack: fruit Second Meal: baked chicken with broccoli  Snack: nuts Third Meal: [place] lasagna  Snack:  Beverages: water  Estimated Energy Needs Calories: 1600  NUTRITION DIAGNOSIS  Overweight/obesity ([place]-3.3) related to past poor dietary habits and physical inactivity as evidenced by patient w/ planned RYGB surgery following  dietary guidelines for continued weight loss.  NUTRITION INTERVENTION  Nutrition counseling (C-1) and education (E-2) to facilitate bariatric surgery goals.  Why you need complex carbohydrates: Whole grains and other complex carbohydrates are required to have a healthy diet. Whole grains provide fiber which can help with blood glucose levels and help keep you satiated. Fruits and starchy vegetables provide essential vitamins and minerals required for immune function, eyesight support, brain support, bone density, wound healing and many other functions within the body. According to the current evidenced based 2020-2025 Dietary Guidelines for Americans, complex carbohydrates are part of a healthy eating pattern which is associated with a decreased risk for type 2 diabetes, cancers, and cardiovascular disease.  Encouraged pt to continue to eat balanced meals inclusive of non starchy vegetables 2 times a day 7 days a week Encouraged pt to continue to drink a minium 64 fluid ounces with half being plain water to satisfy proper hydration   Encouraged patient to honor their body's internal hunger and fullness cues.  Throughout the day, check in mentally and rate hunger. Stop eating when satisfied not full regardless of how much food is left on the plate.  Get more if still hungry 20-30 minutes later.  The key is to honor satisfaction so throughout the meal, rate fullness factor and stop when comfortably satisfied not physically full. The key is to honor hunger and fullness without any feelings of guilt or shame.  Pay attention to what the internal cues are, rather than  any external factors. This will enhance the confidence you have in listening to your own body and following those internal cues enabling you to increase how often you eat when you are hungry not out of appetite and stop when you are satisfied not full.   Pre-Op Goals Progress & New Goals Continue: eat every 3-5 hours; eat within 1-1.5 hours after  waking. Continue: physical activity; start back at the gym (every other day); walk with daughter at night. Continue: use meal ideas handout; continue to aim for 2 or more servings of non-starchy vegetables. Continue: practice separating fluids from foods; stop 15 minutes before eating and wait 30 minutes after eating before drinking again; sip instead of gulping/chugging. New: aim for feeling of satisfaction to avoid fullness; slow down eating; take about 20-30 minutes to eat; chew well Re-engage: quit smoking again New: weigh once a week  Handouts Provided Include    Learning Style & Readiness for Change Teaching method utilized: Visual & Auditory  Demonstrated degree of understanding via: Teach Back  Readiness Level: preparation Barriers to learning/adherence to lifestyle change: nothing identified  RD's Notes for next Visit  Pt progress toward chosen goals  MONITORING & EVALUATION Dietary intake, weekly physical activity, body weight, and pre-op goals in 1 month.   Next Steps  Patient is to return to [facility] in 2 week for next SWL visit.

## 2023-09-26 NOTE — Telephone Encounter (Signed)
L/m for patient to contact clinic for appt to start Tezspure delivery due 11/5

## 2023-09-27 NOTE — Telephone Encounter (Signed)
Sorry she treated you like that, Vernona Rieger!   Malachi Bonds, MD [facility]

## 2023-09-30 ENCOUNTER — Ambulatory Visit: Payer: 59 | Admitting: *Deleted

## 2023-09-30 ENCOUNTER — Ambulatory Visit (INDEPENDENT_AMBULATORY_CARE_PROVIDER_SITE_OTHER): Payer: 59 | Admitting: Dermatology

## 2023-09-30 DIAGNOSIS — L732 Hidradenitis suppurativa: Secondary | ICD-10-CM

## 2023-09-30 DIAGNOSIS — Z79899 Other long term (current) drug therapy: Secondary | ICD-10-CM | POA: Diagnosis not present

## 2023-09-30 DIAGNOSIS — L910 Hypertrophic scar: Secondary | ICD-10-CM

## 2023-09-30 DIAGNOSIS — J4540 Moderate persistent asthma, uncomplicated: Secondary | ICD-10-CM

## 2023-09-30 DIAGNOSIS — L729 Follicular cyst of the skin and subcutaneous tissue, unspecified: Secondary | ICD-10-CM

## 2023-09-30 MED ORDER — FLUCONAZOLE 150 MG PO TABS: 150.0000 mg | ORAL_TABLET | Freq: Every day | ORAL | 4 refills | Status: DC

## 2023-09-30 MED ORDER — TRIAMCINOLONE ACETONIDE 0.1 % EX OINT: TOPICAL_OINTMENT | CUTANEOUS | 1 refills | Status: DC

## 2023-09-30 MED ORDER — MUPIROCIN 2 % EX OINT: TOPICAL_OINTMENT | CUTANEOUS | 1 refills | Status: DC

## 2023-09-30 MED ORDER — TEZEPELUMAB-EKKO 210 MG/1.91ML ~~LOC~~ SOSY
210.0000 mg | PREFILLED_SYRINGE | SUBCUTANEOUS | Status: AC
  Administered 2023-09-30 – 2024-12-16 (×15): 210 mg via SUBCUTANEOUS

## 2023-09-30 NOTE — Progress Notes (Addendum)
Follow-Up Visit   Subjective  [name] is a 47 y.o. female who presents for the following: hx of Severe hidradenatitis suppurativa at inframammary and breast areas. Axilla have really improved since she started Cosentyx- no longer has draining cysts there.  Patient here today for inflamed areas under breast. Reports still bothered by multiple areas draining and oozing. Multiple areas also scarring and crusted.  Referral to general surgeon at [facility] was place. Patient states she has not heard from them to schedule.  Patient is taking doxycycline once daily and fluconazole prn yeast infections.  Doesn't think clindamycin lotion helps.  Tried mupirocin ointment which helped more.   The patient has spots, moles and lesions to be evaluated, some may be new or changing and the patient may have concern these could be cancer.   The following portions of the chart were reviewed this encounter and updated as appropriate: medications, allergies, medical history  Review of Systems:  No other skin or systemic complaints except as noted in HPI or Assessment and Plan.  Objective  Well appearing patient in no apparent distress; mood and affect are within normal limits.   A focused examination was performed of the following areas: B/l Inframammary B/l breast  Relevant exam findings are noted in the Assessment and Plan.    Assessment & Plan   HIDRADENITIS SUPPURATIVA- Severe with extensive scarring, drainage, and pain  Exam: Multiple firm hyperpigmented nodules and plaques and draining tracts of intermammary, inframammary and lower breasts including areola b/l.  BL axillary area with extensive firm ropey nodules c/w scarring.   Chronic and persistent condition with duration or expected duration over one year. Condition is bothersome/symptomatic for patient. Currently flared at breasts. BL axilla have improved with Cosentyx   Hidradenitis Suppurativa is a chronic; persistent; non-curable, but  treatable condition due to abnormal inflamed sweat glands in the body folds (axilla, inframammary, groin, medial thighs), causing recurrent painful draining cysts and scarring. It can be associated with severe scarring acne and cysts; also abscesses and scarring of scalp. The goal is control and prevention of flares, as it is not curable. Scars are permanent and can be thickened. Treatment may include daily use of topical medication and oral antibiotics.  Oral isotretinoin may also be helpful.  For some cases, Humira or Cosentyx (biologic injections) may be prescribed to decrease the inflammatory process and prevent flares.  When indicated, inflamed cysts may also be treated surgically.   Treatment Plan: Pt has referral pending with General Surgery at [facility] clinic Also recommend Referral to Plastic Surgeon for consult for possible breast reduction to surgically remove persistent scarred, draining areas on breasts.  Will submit referral [facility] for severe HS of breast. (Addendum: [facility] responded and does not treat HS)  Will send referral to Dr. Janyth Contes at [facility].  Patient had side effect to minocycline- eye conjunctiva darkening   Start mupirocin 2 % ointment - apply to affected crusted areas 1-2x daily as needed.   Start triamcinolone 0.1 % ointment - apply topically to itchy areas at breasts qd/bid prn. Avoid under breast area.   Continue  Cosentyx 300 mg/30ml inject subcutaneous monthly  Continue as needed Clindamycin lotion qd as directed. Continue Fluconazole 150 take 1 po as directed prn yeast infection Continue doxycycline 100 mg capsule take po every day with food and bid with flares.  Consider Bimzelx injections off label use for HS, would have to use samples.    Doxycycline should  be taken with food to prevent nausea. Do not lay down for 30 minutes after taking. Be cautious with sun exposure and use good sun protection while on this  medication. Pregnant women should not take this medication.    Reviewed risks of biologics including immunosuppression, infections, injection site reaction, and failure to improve condition. Goal is control of skin condition, not cure.  Some older biologics such as Humira and Enbrel may slightly increase risk of malignancy and may worsen congestive heart failure.  Taltz and Cosentyx may cause inflammatory bowel disease to flare. The use of biologics requires long term medication management, including periodic office visits and monitoring of blood work.   Long term medication management.  Patient is using long term (months to years) prescription medication  to control their dermatologic condition.  These medications require periodic monitoring to evaluate for efficacy and side effects and may require periodic laboratory monitoring.   Return in about 3 months (around 12/31/2023) for move appt from dec to february follow up.  I, Asher Muir, CMA, am acting as scribe for Willeen Niece, MD.   Documentation: I have reviewed the above documentation for accuracy and completeness, and I agree with the above.  Willeen Niece, MD

## 2023-09-30 NOTE — Progress Notes (Signed)
Immunotherapy   Patient Details  Name: STARLENE CONSUEGRA MRN: [id number] Date of Birth: 26-Jan-1976  09/30/2023  [name] started injections for  Tezspire  Frequency: Every 28 days Epi-Pen: Not Required  Consent signed and patient instructions given. Patient started Tezspire today and received 1.53ml in the RUA. Patient waited 15 minutes in office per protocol and did not experience any issues.    Danyel Tobey Fernandez-[place]on 09/30/2023, 3:11 PM

## 2023-09-30 NOTE — Patient Instructions (Addendum)
For HS   Continue Cosentyx  Continue Clindamycin lotion as needed Start mupirocin 2% ointment - apply to crusted areas at breast daily and cover Start triamcinolone 0.1 % ointment apply to itchy rash at breast once to twice daily as needed. Avoid areas under breast.  Topical steroids (such as triamcinolone, fluocinolone, fluocinonide, mometasone, clobetasol, halobetasol, betamethasone, hydrocortisone) can cause thinning and lightening of the skin if they are used for too long in the same area. Your physician has selected the right strength medicine for your problem and area affected on the body. Please use your medication only as directed by your physician to prevent side effects.        Due to recent changes in healthcare laws, you may see results of your pathology and/or laboratory studies on MyChart before the doctors have had a chance to review them. We understand that in some cases there may be results that are confusing or concerning to you. Please understand that not all results are received at the same time and often the doctors may need to interpret multiple results in order to provide you with the best plan of care or course of treatment. Therefore, we ask that you please give [place] 2 business days to thoroughly review all your results before contacting the office for clarification. Should we see a critical lab result, you will be contacted sooner.   If You Need Anything After Your Visit  If you have any questions or concerns for your doctor, please call our main line at [phone] and press option 4 to reach your doctor's medical assistant. If no one answers, please leave a voicemail as directed and we will return your call as soon as possible. Messages left after 4 pm will be answered the following business day.   You may also send [place] a message via MyChart. We typically respond to MyChart messages within 1-2 business days.  For prescription refills, please ask your pharmacy to  contact our office. Our fax number is [phone].  If you have an urgent issue when the clinic is closed that cannot wait until the next business day, you can page your doctor at the number below.    Please note that while we do our best to be available for urgent issues outside of office hours, we are not available 24/7.   If you have an urgent issue and are unable to reach [place], you may choose to seek medical care at your doctor's office, retail clinic, urgent care center, or emergency room.  If you have a medical emergency, please immediately call 911 or go to the emergency department.  Pager Numbers  - Dr. Gwen Pounds: [phone]  - Dr. Roseanne Reno: [phone]  - Dr. Katrinka Blazing: [phone]   In the event of inclement weather, please call our main line at [phone] for an update on the status of any delays or closures.  Dermatology Medication Tips: Please keep the boxes that topical medications come in in order to help keep track of the instructions about where and how to use these. Pharmacies typically print the medication instructions only on the boxes and not directly on the medication tubes.   If your medication is too expensive, please contact our office at [phone] option 4 or send [place] a message through MyChart.   We are unable to tell what your co-pay for medications will be in advance as this is different depending on your insurance coverage. However, we may be able to find a substitute medication at lower cost or  fill out paperwork to get insurance to cover a needed medication.   If a prior authorization is required to get your medication covered by your insurance company, please allow [place] 1-2 business days to complete this process.  Drug prices often vary depending on where the prescription is filled and some pharmacies may offer cheaper prices.  The website www.goodrx.com contains coupons for medications through different pharmacies. The prices here do not account for what  the cost may be with help from insurance (it may be cheaper with your insurance), but the website can give you the price if you did not use any insurance.  - You can print the associated coupon and take it with your prescription to the pharmacy.  - You may also stop by our office during regular business hours and pick up a GoodRx coupon card.  - If you need your prescription sent electronically to a different pharmacy, notify our office through [facility] or by phone at [phone] option 4.     Si Usted Necesita Algo Despus de Su Visita  Tambin puede enviarnos un mensaje a travs de Clinical cytogeneticist. Por lo general respondemos a los mensajes de MyChart en el transcurso de 1 a 2 das hbiles.  Para renovar recetas, por favor pida a su farmacia que se ponga en contacto con nuestra oficina. Annie Sable de fax es [place] [phone].  Si tiene un asunto urgente cuando la clnica est cerrada y que no puede esperar hasta el siguiente da hbil, puede llamar/localizar a su doctor(a) al nmero que aparece a continuacin.   Por favor, tenga en cuenta que aunque hacemos todo lo posible para estar disponibles para asuntos urgentes fuera del horario de [place], no estamos disponibles las 24 horas del da, los 7 [address]  [address] de la [place].   Si tiene un problema urgente y no puede comunicarse con nosotros, puede optar por buscar atencin mdica  en el consultorio de su doctor(a), en una clnica privada, en un centro de atencin urgente o en una sala de emergencias.  Si tiene Engineer, drilling, por favor llame inmediatamente al 911 o vaya a la sala de emergencias.  Nmeros de bper  - Dr. Gwen Pounds: [phone]  - Dra. Roseanne Reno: [phone]  - Dr. Katrinka Blazing: [phone]   En caso de inclemencias del tiempo, por favor llame a Lacy Duverney principal al [phone] para una actualizacin sobre el [place] de cualquier retraso o cierre.  Consejos para la medicacin en dermatologa: Por favor, guarde las  cajas en las que vienen los medicamentos de uso tpico para ayudarle a seguir las instrucciones sobre dnde y cmo usarlos. Las farmacias generalmente imprimen las instrucciones del medicamento slo en las cajas y no directamente en los tubos del [place].   Si su medicamento es muy caro, por favor, pngase en contacto con Rolm Gala llamando al [phone] y presione la opcin 4 o envenos un mensaje a travs de Clinical cytogeneticist.   No podemos decirle cul ser su copago por los medicamentos por adelantado ya que esto es diferente dependiendo de la cobertura de su seguro. Sin embargo, es posible que podamos encontrar un medicamento sustituto a Audiological scientist un formulario para que el seguro cubra el medicamento que se considera necesario.   Si se requiere una autorizacin previa para que su compaa de seguros [place] su medicamento, por favor permtanos de 1 a 2 das hbiles para completar [address]  Los precios de los medicamentos varan con frecuencia dependiendo del Environmental consultant de dnde se surte la  receta y alguna farmacias pueden ofrecer precios ms baratos.  El sitio web www.goodrx.com tiene cupones para medicamentos de Health and safety inspector. Los precios aqu no tienen en cuenta lo que podra costar con la ayuda del seguro (puede ser ms barato con su seguro), pero el sitio web puede darle el precio si no utiliz Tourist information centre manager.  - Puede imprimir el cupn correspondiente y llevarlo con su receta a la farmacia.  - Tambin puede pasar por nuestra oficina durante el horario de atencin regular y Education officer, museum una tarjeta de cupones de GoodRx.  - Si necesita que su receta se enve electrnicamente a una farmacia diferente, informe a nuestra oficina a travs de MyChart de [place] o por telfono llamando al [phone] y presione la opcin 4.

## 2023-10-01 ENCOUNTER — Other Ambulatory Visit: Payer: Self-pay

## 2023-10-01 ENCOUNTER — Encounter: Payer: 59 | Attending: Physical Medicine and Rehabilitation | Admitting: Physical Medicine and Rehabilitation

## 2023-10-01 VITALS — BP 138/83 | HR 62 | Ht 68.0 in | Wt 321.0 lb

## 2023-10-01 DIAGNOSIS — R200 Anesthesia of skin: Secondary | ICD-10-CM | POA: Diagnosis present

## 2023-10-01 DIAGNOSIS — G894 Chronic pain syndrome: Secondary | ICD-10-CM

## 2023-10-01 DIAGNOSIS — Z79891 Long term (current) use of opiate analgesic: Secondary | ICD-10-CM | POA: Diagnosis present

## 2023-10-01 DIAGNOSIS — R202 Paresthesia of skin: Secondary | ICD-10-CM | POA: Insufficient documentation

## 2023-10-01 DIAGNOSIS — M1611 Unilateral primary osteoarthritis, right hip: Secondary | ICD-10-CM | POA: Diagnosis present

## 2023-10-01 MED ORDER — OXYCODONE-ACETAMINOPHEN 5-325 MG PO TABS: 1.0000 | ORAL_TABLET | Freq: Two times a day (BID) | ORAL | 0 refills | Status: DC | PRN

## 2023-10-01 MED ORDER — OXYCODONE-ACETAMINOPHEN 5-325 MG PO TABS: 1.0000 | ORAL_TABLET | Freq: Two times a day (BID) | ORAL | 0 refills | Status: AC | PRN

## 2023-10-01 NOTE — Patient Instructions (Addendum)
Today, we discussed retiming your medications to take 2 tablets at the time of day of greatest pain, and save the other 2 tablets to be taken individually in 4 to 6-hour intervals during the day.  This should help with better overall duration of control of your pain.  Try to take medication before doing an exacerbating activity, such as exercise.  After reviewing your labs, it appears that your kidney function is good, and you endorsed no history of gastrointestinal or heart disease issues; because of this, I do think it safe to use ibuprofen as an adjunctive pain medication.  You can use this 1-2 times daily, 2 tabs or 800 mg total.  Use your 3 point cane in your left hand when you walk to offload your right hip.  We did talk about the numbness and tingling in your right anterior thigh, how this could be from your back or from meralgia paresthetica.  As it is not painful or bothering you at this point, you can try over-the-counter lidocaine cream or capsaicin cream to dull the sensation, but we will not pursue medication treatment.  Keep up with the weight loss and exercise!     Follow-up with my nurse practitioner Riley Lam every other month and me every 6 months. Feel free to use MyChart between appointments to discuss any acute issues, making usually get back to within 48 hours.

## 2023-10-01 NOTE — Addendum Note (Signed)
Addended by: Asher Muir A on: 10/01/2023 05:44 PM   Modules accepted: Orders

## 2023-10-01 NOTE — Addendum Note (Signed)
Addended by: Asher Muir A on: 10/01/2023 06:19 PM   Modules accepted: Orders

## 2023-10-01 NOTE — Progress Notes (Signed)
Subjective:    Patient ID: [name], female    DOB: 09/22/1976, 47 y.o.   MRN: [id number]  HPI   [name] is a 47 y.o. year old female  who  has a past medical history of Asthma, Hydradenitis, MRSA (methicillin resistant staph aureus) culture positive, and Obesity.   They are presenting to PM&R clinic for follow up related to right hip and left knee pain .  Plan from last visit:  Chronic pain syndrome Encounter for long-term opiate analgesic use Encounter for therapeutic drug monitoring I am refilling your pain medications at the same dose for the next 3 months; follow up with myself or our nurse practicioner Riley Lam every 3 months for refills.    Primary osteoarthritis of right hip Goal is for surgery; keep up the excellent activity level and weight loss!     Morbid obesity (HCC) Working hard, goal is 260 lbs Insurance denied injections through her PCP She is working with a nutritionist and staying active, has lost significant weight since last visit Goal is to avoid bariatric surgery   Tobacco use disorder Discussed readiness to quit; down to 3 per day and willing to try nicotine patch, encourage ongoing cessation      Interval Hx:  - Therapies: Is going to the gym for an hour every other day to the treadmill or bike, as well as getting a bike for home. She is anticipating getting it on Friday. Mobility is still very limited by her right hip.   - Follow ups: Followed up with nutrition, goal weight is 250 just to 60 pounds in order to qualify for surgery, at last visit had lost approximately 10 pounds from start weight. Patient states she has follow ups  through January; will be starting to talk about bypass in March.   Saw her orthopedic doctor recently because her right hip "gave out" 07/29/23; numbness from her lateral hip to her knee starting last month. No associated with weakness.    - Falls: none   - DME: Uses [employer] cane.    - Medications:  Percocet 5 mg #120 tabs per month, last filled 09/02/2023 - "the pills are wearing off quicker now; I'm working thorugh it". She takes 2 tabs in the morning and at nighttime.   Ibuprofen 800 mg 3 times daily - not using  Zanaflex 4 mg- not taking  Hot showers also help the pain.    - Other concerns: got approved for surgery for her breasts for HS  Has noticed more swelling in her feet.   Pain Inventory Average Pain 7 Pain Right Now 10 My pain is burning and .  In the last 24 hours, has pain interfered with the following? General activity 9 Relation with others 8 Enjoyment of life 8 What TIME of day is your pain at its worst? night Sleep (in general) Poor  Pain is worse with: walking, bending, sitting, inactivity, standing, and some activites Pain improves with: medication and bath Relief from Meds: 5  Family History  Problem Relation Age of Onset   Diabetes Mother    Hypertension Mother    Diabetes Father    Asthma Other    Hyperlipidemia Other    Hypertension Other    Social History   Socioeconomic History   Marital status: Single    Spouse name: Not on file   Number of children: Not on file   Years of education: Not on file   Highest education level: 10th  grade  Occupational History   Not on file  Tobacco Use   Smoking status: Some Days    Current packs/day: 0.33    Types: Cigarettes   Smokeless tobacco: Never  Vaping Use   Vaping status: Never Used  Substance and Sexual Activity   Alcohol use: Yes    Comment: once a month at a party beer wine or mixed drinks   Drug use: No   Sexual activity: Yes    Birth control/protection: None  Other Topics Concern   Not on file  Social History Narrative   Not on file   Social Determinants of Health   Financial Resource Strain: Medium Risk (04/22/2023)   Overall Financial Resource Strain (CARDIA)    Difficulty of Paying Living Expenses: Somewhat hard  Food Insecurity: Food Insecurity Present (04/22/2023)    Hunger Vital Sign    Worried About Running Out of Food in the Last Year: Sometimes true    Ran Out of Food in the Last Year: Sometimes true  Transportation Needs: No Transportation Needs (04/22/2023)   PRAPARE - Administrator, Civil Service (Medical): No    Lack of Transportation (Non-Medical): No  Physical Activity: Not on file  Stress: Not on file  Social Connections: Unknown (04/22/2023)   Social Connection and Isolation Panel [NHANES]    Frequency of Communication with Friends and Family: More than three times a week    Frequency of Social Gatherings with Friends and Family: More than three times a week    Attends Religious Services: Patient declined    Database administrator or Organizations: No    Attends Engineer, structural: Not on file    Marital Status: Patient declined   Past Surgical History:  Procedure Laterality Date   CESAREAN SECTION     x2   TUBAL LIGATION     WISDOM TOOTH EXTRACTION  2011   Past Surgical History:  Procedure Laterality Date   CESAREAN SECTION     x2   TUBAL LIGATION     WISDOM TOOTH EXTRACTION  2011   Past Medical History:  Diagnosis Date   Asthma    Hydradenitis    MRSA (methicillin resistant staph aureus) culture positive    Obesity    BP (!) 161/79   Pulse 62   Ht 5\' 8"  (1.727 m)   Wt (!) 321 lb (145.6 kg)   LMP 09/02/2023 (Approximate)   SpO2 97%   BMI 48.81 kg/m   Opioid Risk Score:   Fall Risk Score:  `1  Depression screen [facility] 2/9     06/09/2023   10:04 AM 04/25/2023    9:49 AM 03/31/2023   10:06 AM 03/21/2023   10:16 AM 03/03/2023    9:31 AM 01/03/2023    9:15 AM 06/22/2021    2:12 PM  Depression screen PHQ 2/9  Decreased Interest 0 0 0 0 3 0 3  Down, Depressed, Hopeless 0 0 0 0 0 0 3  PHQ - 2 Score 0 0 0 0 3 0 6  Altered sleeping  0  0 3 0 2  Tired, decreased energy  0  0 0 0 3  Change in appetite  0  0 3 0 0  Feeling bad or failure about yourself       0 1  Trouble concentrating  0  0 0 0 0   Moving slowly or fidgety/restless  0  0 0 0 0  Suicidal thoughts  0  0  0 0 1  PHQ-9 Score  0  0 9 0 13     Review of Systems  Genitourinary:  Positive for pelvic pain.  Musculoskeletal:  Positive for back pain.       Bilateral knee pain Buttocks pain  All other systems reviewed and are negative.     Objective:   Physical Exam  Constitution: Appropriate appearance for age. No apparent distress  +Obese Resp: No respiratory distress. No accessory muscle usage. on RA and CTAB Cardio: Well perfused appearance. No peripheral edema. Abdomen: Nondistended. Nontender.   Psych: Appropriate mood and affect. Neuro: AAOx4. No apparent cognitive deficits    Neurologic Exam:   Babinsky: flexor responses b/l.   Hoffmans: negative b/l Sensory exam: revealed normal sensation in all dermatomal regions in , bilateral upper extremities, and left lower extremity.+ sensory loss in R anteriolateral thigh only Motor exam: strength 5/5 throughout bilateral upper extremities, bilateral lower extremity  Coordination: Fine motor coordination was normal.   Gait: +antalgic gait, offloading R leg, using cane in R hand   MSK:  + TTP bilateral lumbar paraspinals, PSISs + facet loading  - SLR, slump      Assessment & Plan:   SAI AUSMUS is a 47 y.o. year old female  who  has a past medical history of Asthma, Hydradenitis, MRSA (methicillin resistant staph aureus) culture positive, and Obesity.    They are presenting to PM&R clinic for follow up related to chronic pain management with right hip and left knee pain .  Chronic pain syndrome Encounter for long-term opiate analgesic use Today, we discussed retiming your medications to take Percocet 5 mg 2 tablets at the time of day of greatest pain, and save the other 2 tablets to be taken individually in 4 to 6-hour intervals during the day.  This should help with better overall duration of control of your pain.  Try to take medication before doing an  exacerbating activity, such as exercise.  Follow-up with my nurse practitioner Riley Lam every other month and me every 6 months. Feel free to use MyChart between appointments to discuss any acute issues, making usually get back to within 48 hours.   Numbness and tingling of right leg  We did talk about the numbness and tingling in your right anterior thigh, how this could be from your back or from meralgia paresthetica.  As it is not painful or bothering you at this point, you can try over-the-counter lidocaine cream or capsaicin cream to dull the sensation, but we will not pursue medication treatment.  Primary osteoarthritis of right hip After reviewing your labs, it appears that your kidney function is good, and you endorsed no history of gastrointestinal or heart disease issues; because of this, I do think it safe to use ibuprofen as an adjunctive pain medication.  You can use this 1-2 times daily, 2 tabs or 800 mg total.  Use your 3 point cane in your left hand when you walk to offload your right hip.  Goal is surgery; weight cutoff 250 lbs  Morbid obesity (HCC) Keep up with the weight loss and exercise!     Per patient,  discussion of gastric bypass in March   Other orders -     oxyCODONE-Acetaminophen; Take 1-2 tablets by mouth 2 (two) times daily as needed for severe pain (pain score 7-10).  Dispense: 120 tablet; Refill: 0 -     oxyCODONE-Acetaminophen; Take 1-2 tablets by mouth 2 (two) times daily as needed for severe  pain (pain score 7-10).  Dispense: 120 tablet; Refill: 0

## 2023-10-05 DIAGNOSIS — R200 Anesthesia of skin: Secondary | ICD-10-CM | POA: Insufficient documentation

## 2023-10-09 ENCOUNTER — Other Ambulatory Visit: Payer: Self-pay

## 2023-10-09 DIAGNOSIS — L732 Hidradenitis suppurativa: Secondary | ICD-10-CM

## 2023-10-09 MED ORDER — COSENTYX UNOREADY 300 MG/2ML ~~LOC~~ SOAJ: 300.0000 mg | SUBCUTANEOUS | 2 refills | Status: DC

## 2023-10-21 ENCOUNTER — Telehealth: Payer: Self-pay

## 2023-10-21 NOTE — Telephone Encounter (Signed)
Patient called office for you stating general surgeon recommended not to cut it.  That is all the information the patient left me.

## 2023-10-21 NOTE — Telephone Encounter (Addendum)
Called patient to offer her a follow up appointment for trouble areas of HS at breast with Dr. Katrinka Blazing. Dr. Roseanne Reno would like Dr. Katrinka Blazing to evaluate the area. Patient has had multiple referral placed recommending surgery for areas. All have refused treatment   Patient would like to be seen by Dr. Katrinka Blazing, states has some active draining areas at breast that she would like looked at.   Patient also states she was able to get appointment with Dr. Janyth Contes at [facility] for further evaluation and treatment of HS and December 18, 2023. Patient advised to keep that appointment

## 2023-10-27 ENCOUNTER — Encounter: Payer: Self-pay | Admitting: Dietician

## 2023-10-27 ENCOUNTER — Encounter: Payer: 59 | Attending: Surgery | Admitting: Dietician

## 2023-10-27 VITALS — Ht 68.0 in | Wt 315.5 lb

## 2023-10-27 DIAGNOSIS — E669 Obesity, unspecified: Secondary | ICD-10-CM | POA: Diagnosis present

## 2023-10-27 DIAGNOSIS — Z6841 Body Mass Index (BMI) 40.0 and over, adult: Secondary | ICD-10-CM | POA: Insufficient documentation

## 2023-10-27 NOTE — Telephone Encounter (Signed)
VOB initiated for ZILRETTA for LEFT knee OA

## 2023-10-27 NOTE — Progress Notes (Signed)
Supervised Weight Loss Visit Bariatric Nutrition Education Appt Start Time:  8:50   End Time: 9:15  Planned surgery: Sleeve Gastrectomy Pt expectation of surgery:  to be 250-260 lbs  5 out of 6 SWL Appointments    NUTRITION ASSESSMENT   Anthropometrics  Start weight at [facility]: 324.7 lbs (date: 06/09/2023)  Height: 68 in Weight today: 315.5 lb BMI: 47.97 kg/m2     Clinical   Pharmacotherapy: History of weight loss medication used: zepbound has not been approved Medical hx: reflux, asthma Medications: see list   Labs: LDL 126; iron 32; RDW 18.9 Notable signs/symptoms: none noted Any previous deficiencies? No  Lifestyle & Dietary Hx  Pt states she had a couple of episodes of emesis, stating she ate too much. Pt states one of the days was Thanksgiving day. Pt states she has been aiming for satisfaction instead of fullness most days. Pt states she has not smoked for a couple of weeks, stating she will have completely stopped by January. Pt states she has stopped drinking alcohol at age 47, stating she knows she can stop smoking. Pt states she got her treadmill, stating she has used it every other day for 30 minutes. Pt states she is getting the sleeve surgery instead of the by pass.  Estimated daily fluid intake: 64 oz Supplements: Iron (2x per day, 352 mg) Current average weekly physical activity: treadmill, every other day for 30 minutes.  24-Hr Dietary Recall First Meal: two eggs and toast Snack: fruit Second Meal: baked chicken with broccoli  Snack: nuts Third Meal: [place] lasagna  Snack:  Beverages: water  Estimated Energy Needs Calories: 1600  NUTRITION DIAGNOSIS  Overweight/obesity ([place]-3.3) related to past poor dietary habits and physical inactivity as evidenced by patient w/ planned RYGB surgery following dietary guidelines for continued weight loss.  NUTRITION INTERVENTION  Nutrition counseling (C-1) and education (E-2) to facilitate bariatric surgery  goals.  Encouraged patient to honor their body's internal hunger and fullness cues.  Throughout the day, check in mentally and rate hunger. Stop eating when satisfied not full regardless of how much food is left on the plate.  Get more if still hungry 20-30 minutes later.  The key is to honor satisfaction so throughout the meal, rate fullness factor and stop when comfortably satisfied not physically full. The key is to honor hunger and fullness without any feelings of guilt or shame.  Pay attention to what the internal cues are, rather than any external factors. This will enhance the confidence you have in listening to your own body and following those internal cues enabling you to increase how often you eat when you are hungry not out of appetite and stop when you are satisfied not full.  Encouraged pt to continue to eat balanced meals inclusive of non starchy vegetables 2 times a day 7 days a week Encouraged pt to choose lean protein sources: limiting beef, pork, sausage, hotdogs, and lunch meat Encourage pt to choose healthy fats such as plant based limiting animal fats Encouraged pt to continue to drink a minium 64 fluid ounces with half being plain water to satisfy proper hydration   Purpose of protein: Every cell in your body has protein. Protein is essential for the structure, function and regulation of tissues and organs within the body. Without protein enzymes and antibodies would not exist, and cells would lack storage, transportation, and messenger systems. According to [facility]. [employer], the body is made up of at least 10000 different  proteins. Lack of protein can lead to growth failure in children, loss of muscle mass, decreased immune system function, and overall weakening of various organs in the body.  SearchEngineCritic.nl, DoubleProperty.com.cy,  PokerProtocol.pl   Pre-Op Goals Progress & New Goals Continue: eat every 3-5 hours; eat within 1-1.5 hours after waking. Continue: physical activity; start back at the gym (every other day); walk with daughter at night. Continue: use meal ideas handout; continue to aim for 2 or more servings of non-starchy vegetables. Continue: practice separating fluids from foods; stop 15 minutes before eating and wait 30 minutes after eating before drinking again; sip instead of gulping/chugging. Continue: aim for feeling of satisfaction to avoid fullness; slow down eating; take about 20-30 minutes to eat; chew well Re-engage: quit smoking again; no more cigarettes by January New: eat your protein first; track protein, aim for 60 grams per day  Handouts Provided Include  Protein foods list with value  Learning Style & Readiness for Change Teaching method utilized: Visual & Auditory  Demonstrated degree of understanding via: Teach Back  Readiness Level: preparation Barriers to learning/adherence to lifestyle change: nothing identified  RD's Notes for next Visit  Pt progress toward chosen goals  MONITORING & EVALUATION Dietary intake, weekly physical activity, body weight, and pre-op goals in 1 month.   Next Steps  Patient is to return to [facility] in 2 week for next SWL visit.

## 2023-10-28 ENCOUNTER — Ambulatory Visit: Payer: 59

## 2023-10-28 NOTE — Telephone Encounter (Signed)
Prior Authorization REQUIRED for [employer] and [employer]  Primary Insurance: [facility] Managed Medicare  No prior authorization, medical notes or referrals needed. Patient has a Fully Funded, Dual Complete Medicare Advantage HMO/POS plan with an effective date of 11/25/2022. Plan follows Medicare guidelines. Patient responsibility for [phone] [name]), CPT code 60454 and specialist office visits will be 20% with the remaining covered at 80% by the payer at the contracted rate. Deductibles do not apply to these services. Patient has an out of pocket maximum of $8850 and has accumulated $1890.46. If out of pocket is met, coverage goes to 100%. Patient has Medicaid as secondary insurance. Member's responsibilities left over by Medicare can be billed to Medicaid (If eligible). Member will be covered at 100% of the allowable amount. Plan renews on 11/26/2023. [employer] recommends that the provider resubmit for dates of service after 11/26/2023. Benefits for dates of service after 11/26/2023 may vary.    Specialty Pharmacy  A prior authorization is required through the patient's prescription plan. The office must contact [phone] to initiate. Patient has a Pharmacy deductible of [phone]. Deductible has been met. Patient has a Pharmacy out of pocket max of $8000 and has met [phone]. The copay/coinsurance could not be confirmed until a Prior Auth is approved. The pharmacy will confirm the patient responsibility when the prescription is processed. This patient has the option to obtain ZILRETTA at [facility]. If you would like to obtain the medication through [facility], please contact us at [phone]

## 2023-10-28 NOTE — Telephone Encounter (Signed)
SPECIALTY PHARMACY  Prior Authorization initiated for ZILRETTA via CoverMyMeds.com KEY: [id number]

## 2023-10-29 ENCOUNTER — Ambulatory Visit: Payer: 59 | Admitting: Dermatology

## 2023-10-29 ENCOUNTER — Encounter: Payer: Self-pay | Admitting: Dermatology

## 2023-10-29 DIAGNOSIS — Z79899 Other long term (current) drug therapy: Secondary | ICD-10-CM | POA: Diagnosis not present

## 2023-10-29 DIAGNOSIS — L732 Hidradenitis suppurativa: Secondary | ICD-10-CM

## 2023-10-29 DIAGNOSIS — Z7189 Other specified counseling: Secondary | ICD-10-CM

## 2023-10-29 NOTE — Telephone Encounter (Signed)
Prior Auth for [employer] APPROVED PA# [id number] Valid: 10/28/23- 11/24/24

## 2023-10-29 NOTE — Telephone Encounter (Signed)
Transferred for Specialty Pharmacy fulfillment via [employer] portal.

## 2023-10-29 NOTE — Progress Notes (Signed)
Follow Up Visit   Subjective  [name] is a 47 y.o. female who presents for the following: Severe Hidradenitis Suppurativa of the breasts, inframammary areas, axilla. She is improved with Cosentyx injections, no new areas but has flares of older areas. She is using Clindamycin lotion once a day, doxycycline 100 mg po daily, triamcinolone ointment to itchy areas on the breasts, and mupirocin ointment to open areas. She has an appointment with Destiny Patten, PA at [facility] in Jan 2025. She has upcoming appointment to discuss Gastric Sleeve.    The following portions of the chart were reviewed this encounter and updated as appropriate: medications, allergies, medical history  Review of Systems:  No other skin or systemic complaints except as noted in HPI or Assessment and Plan.  Objective  Well appearing patient in no apparent distress; mood and affect are within normal limits.  A focused examination was performed of the following areas:  breasts  Relevant exam findings are noted in the Assessment and Plan.           Assessment & Plan     HIDRADENITIS SUPPURATIVA Severe with extensive scarring, drainage, and pain   Exam: draining sinus tracts of areolae, medial proximal left breast, intermammary skin. Hypertrophic scarring and draining sinus tract of right axilla. severe atrophic and bridged scarring of axillae, inframammary skin and flanks. Lesion on buttocks; patient defers exam  Chronic and persistent condition with duration or expected duration over one year. Condition is bothersome/symptomatic for patient. Currently flared.   Hidradenitis Suppurativa is a chronic; persistent; non-curable, but treatable condition due to abnormal inflamed sweat glands in the body folds (axilla, inframammary, groin, medial thighs), causing recurrent painful draining cysts and scarring. It can be associated with severe scarring acne and cysts; also abscesses and scarring  of scalp. The goal is control and prevention of flares, as it is not curable. Scars are permanent and can be thickened. Treatment may include daily use of topical medication and oral antibiotics.  Oral isotretinoin may also be helpful.  For some cases, Humira or Cosentyx (biologic injections) may be prescribed to decrease the inflammatory process and prevent flares.  When indicated, inflamed cysts may also be treated surgically.  Treatment Plan: Discussed excision of area right axilla. Patient may consider in the future.  Sent Epic message to Dr Janyth Contes on [facility] given that it is affecting nipple-areolar unit Keep appointment with Destiny Patten, PA at [facility] who will likely consult with Dr Janyth Contes Patient is seeing dietician and has lost weight. She has upcoming appointment to discuss Gastric sleeve (which would happen April 2025). Also has childcare responsibilities that would be hindered by an axillary wound   Continue care as delineated by Dr Roseanne Reno Cont mupirocin 2 % ointment - apply to affected crusted areas 1-2x daily as needed.  Cont triamcinolone 0.1 % ointment - apply topically to itchy areas at breasts qd/bid prn. Avoid under breast area.  Continue  Cosentyx 300 mg/71ml inject subcutaneous monthly  Continue as needed Clindamycin lotion qd as directed. Continue Fluconazole 150 take 1 po as directed prn yeast infection Continue doxycycline 100 mg capsule take po every day with food and bid with flares.  Consider Bimzelx injections off label use for HS, would have to use samples.   Doxycycline should be taken with food to prevent nausea. Do not lay down for 30 minutes after taking. Be cautious with sun exposure and use good sun protection while on this medication. Pregnant women  should not take this medication.     Long term medication management.  Patient is using long term (months to years) prescription medication  to control their dermatologic condition.  These medications  require periodic monitoring to evaluate for efficacy and side effects and may require periodic laboratory monitoring.   Return as scheduled with Dr Roseanne Reno.  Wendee Beavers, CMA, am acting as scribe for Elie Goody, MD .   Documentation: I have reviewed the above documentation for accuracy and completeness, and I agree with the above.  Elie Goody, MD

## 2023-10-29 NOTE — Patient Instructions (Signed)

## 2023-10-30 ENCOUNTER — Ambulatory Visit: Payer: Medicare Other | Admitting: Dermatology

## 2023-10-31 ENCOUNTER — Ambulatory Visit (INDEPENDENT_AMBULATORY_CARE_PROVIDER_SITE_OTHER): Payer: 59 | Admitting: Nurse Practitioner

## 2023-10-31 ENCOUNTER — Ambulatory Visit (INDEPENDENT_AMBULATORY_CARE_PROVIDER_SITE_OTHER): Payer: 59

## 2023-10-31 VITALS — BP 130/78 | HR 89 | Temp 97.8°F | Ht 68.0 in | Wt 316.1 lb

## 2023-10-31 DIAGNOSIS — K5900 Constipation, unspecified: Secondary | ICD-10-CM | POA: Insufficient documentation

## 2023-10-31 DIAGNOSIS — M25572 Pain in left ankle and joints of left foot: Secondary | ICD-10-CM | POA: Insufficient documentation

## 2023-10-31 MED ORDER — TRULANCE 3 MG PO TABS: 1.0000 | ORAL_TABLET | Freq: Every day | ORAL | 1 refills | Status: DC

## 2023-10-31 NOTE — Assessment & Plan Note (Signed)
Acute, mild to moderate Check xray to rule out fracture Treat with her chronic as needed percocet, use ace wrap, ice, elevation rest, ibuprofen with a small snack for as needed breakthrough pain.  Further recommendations may be made based upon x-ray results, patient courage to return to office if symptoms persist or worsen.

## 2023-10-31 NOTE — Assessment & Plan Note (Signed)
Chronic Slightly improved with addition of Ryze coffee.  Will trial Trulance 3 mg daily. Follow-up with GI scheduled. Follow-up with me for CPE end of April or early May.

## 2023-10-31 NOTE — Progress Notes (Signed)
Established Patient Office Visit  Subjective   Patient ID: [name], female    DOB: [birth year]  Age: 47 y.o. MRN: [id number]  Chief Complaint  Patient presents with   Medical Management of Chronic Issues    Ankle pain and 3 month follow up     Left ankle pain: Onset 2 weeks ago, located laterally.  No specific trauma but does have grandson who likes to hold onto her leg and sometimes bounces on her leg.  6 for 10 in intensity, described as dull and sharp.  Aggravated by laying down with lateral ankle on bed or hitting it.  Mildly worsens with ambulation.  Takes her chronic Percocet as needed.  Constipation/Abdominal Pain: Has started drinking Ryze coffee which has improved abdominal pain, still feels that she is constipated.  Has multiple daily bowel movements, but reports that she does not feel she is fully emptying her bowels with her movements.  Would like to trial Trulance.  Has colonoscopy coming up next month.    Review of Systems  Respiratory:  Negative for shortness of breath.   Cardiovascular:  Negative for chest pain.  Gastrointestinal:  Positive for constipation. Negative for abdominal pain, blood in stool, nausea and vomiting.      Objective:     BP 130/78   Pulse 89   Temp 97.8 F (36.6 C) (Temporal)   Ht 5\' 8"  (1.727 m)   Wt (!) 316 lb 2 oz (143.4 kg)   SpO2 95%   BMI 48.07 kg/m  BP Readings from Last 3 Encounters:  10/31/23 130/78  10/01/23 138/83  09/18/23 138/88   Wt Readings from Last 3 Encounters:  10/31/23 (!) 316 lb 2 oz (143.4 kg)  10/27/23 (!) 315 lb 8 oz (143.1 kg)  10/01/23 (!) 321 lb (145.6 kg)      Physical Exam Vitals reviewed.  Constitutional:      General: She is not in acute distress.    Appearance: Normal appearance.  HENT:     Head: Normocephalic and atraumatic.  Neck:     Vascular: No carotid bruit.  Cardiovascular:     Rate and Rhythm: Normal rate and regular rhythm.     Pulses: Normal pulses.     Heart sounds:  Normal heart sounds.  Pulmonary:     Effort: Pulmonary effort is normal.     Breath sounds: Normal breath sounds.  Abdominal:     General: Bowel sounds are normal.     Tenderness: There is no abdominal tenderness.  Musculoskeletal:     Left ankle: No swelling. Tenderness present over the lateral malleolus. Normal pulse.  Skin:    General: Skin is warm and dry.  Neurological:     General: No focal deficit present.     Mental Status: She is alert and oriented to person, place, and time.  Psychiatric:        Mood and Affect: Mood normal.        Behavior: Behavior normal.        Judgment: Judgment normal.      No results found for any visits on 10/31/23.    The 10-year ASCVD risk score (Arnett DK, et al., 2019) is: 2.7%    Assessment & Plan:   Problem List Items Addressed This Visit       Other   Constipation - Primary    Chronic Slightly improved with addition of Ryze coffee.  Will trial Trulance 3 mg daily. Follow-up with GI scheduled. Follow-up with  me for CPE end of April or early May.      Relevant Medications   Plecanatide (TRULANCE) 3 MG TABS   Acute left ankle pain    Acute, mild to moderate Check xray to rule out fracture Treat with her chronic as needed percocet, use ace wrap, ice, elevation rest, ibuprofen with a small snack for as needed breakthrough pain.  Further recommendations may be made based upon x-ray results, patient courage to return to office if symptoms persist or worsen.      Relevant Orders   DG Ankle Complete Left    Return in about 5 months (around 03/30/2024) for CPE with Maralyn Sago.    Elenore Paddy, NP

## 2023-10-31 NOTE — Patient Instructions (Signed)
Eucerin - Eczema cream-- apply to skin twice a day

## 2023-11-03 ENCOUNTER — Encounter: Payer: Self-pay | Admitting: Dermatology

## 2023-11-04 NOTE — Telephone Encounter (Signed)
Pharmacy called stating that they received the prescription and have made several attempts to contact the patient but she has been unresponsive

## 2023-11-06 ENCOUNTER — Ambulatory Visit: Payer: 59

## 2023-11-06 DIAGNOSIS — J4550 Severe persistent asthma, uncomplicated: Secondary | ICD-10-CM | POA: Diagnosis not present

## 2023-11-07 ENCOUNTER — Other Ambulatory Visit: Payer: Self-pay | Admitting: Allergy & Immunology

## 2023-11-07 NOTE — Telephone Encounter (Signed)
I received a voicemail after hours from the pharmacy needing to schedule a delivery for her Zilretta injection.  Looks like she is not due until 12/30 so just wanted to confirm when this should be sent?  Pharmacy: [phone] (hours: 9am-6pm)

## 2023-11-07 NOTE — Telephone Encounter (Signed)
Requested additional information submitted via FAX.

## 2023-11-07 NOTE — Telephone Encounter (Signed)
Received another call from [facility] stating that they need a copy of the patient's medication list? He said that they tried to reach out to the patient but were not able to get a hold of her.  Fax # [phone]  Call Back # [phone]  Can you help? I'm confused.

## 2023-11-10 NOTE — Telephone Encounter (Signed)
Called and spoke with patient. She has been in contact with the speciality pharmacy.   I will reach out to the pharmacy to see when rx can be shipped to the office.

## 2023-11-10 NOTE — Telephone Encounter (Signed)
Pharmacy called back in regards to setting up the shipping of Zilretta.   She is not due until 12/30. Just wanted to make sure that it is okay to be filled by the pharmacy and shipped before that date?  Call Back # [phone]

## 2023-11-11 ENCOUNTER — Ambulatory Visit: Payer: 59 | Admitting: Dermatology

## 2023-11-11 NOTE — Telephone Encounter (Signed)
Pharmacy called again, delivery date to office is scheduled for 11/13/2023 per rep.  Pt had no copay.  Please HOLD til after 11/24/2023 per notes.

## 2023-11-12 ENCOUNTER — Other Ambulatory Visit: Payer: Self-pay | Admitting: Nurse Practitioner

## 2023-11-12 DIAGNOSIS — M25572 Pain in left ankle and joints of left foot: Secondary | ICD-10-CM

## 2023-11-13 NOTE — Telephone Encounter (Signed)
Spoke with pt, injection not needed at this time. Will cb to schedule as needed.

## 2023-11-13 NOTE — Telephone Encounter (Addendum)
[name] has been received. Please reach out to pt about scheduling when appropriate.   Last [name] inj 09/01/23 Can consider repeat injection on or after 11/25/23

## 2023-11-16 ENCOUNTER — Other Ambulatory Visit: Payer: Self-pay | Admitting: Family

## 2023-11-19 ENCOUNTER — Other Ambulatory Visit: Payer: Self-pay | Admitting: Allergy & Immunology

## 2023-11-27 ENCOUNTER — Encounter: Payer: 59 | Attending: Surgery | Admitting: Dietician

## 2023-11-27 ENCOUNTER — Encounter: Payer: Self-pay | Admitting: Dietician

## 2023-11-27 VITALS — Ht 68.0 in | Wt 311.7 lb

## 2023-11-27 DIAGNOSIS — Z713 Dietary counseling and surveillance: Secondary | ICD-10-CM | POA: Insufficient documentation

## 2023-11-27 DIAGNOSIS — E669 Obesity, unspecified: Secondary | ICD-10-CM | POA: Diagnosis present

## 2023-11-27 DIAGNOSIS — Z6841 Body Mass Index (BMI) 40.0 and over, adult: Secondary | ICD-10-CM | POA: Insufficient documentation

## 2023-11-27 NOTE — Telephone Encounter (Signed)
 VOB initiated for 2025 coverage of Zilretta.

## 2023-11-27 NOTE — Progress Notes (Signed)
 Supervised Weight Loss Visit Bariatric Nutrition Education Appt Start Time:  9076   End Time: 0950  Planned surgery: Sleeve Gastrectomy Pt expectation of surgery:  to be 250-260 lbs  6 out of 6 SWL Appointments   Pt completed visits.   Pt has cleared nutrition requirements.    NUTRITION ASSESSMENT   Anthropometrics  Start weight at [facility]: 324.7 lbs (date: 06/09/2023)  Height: 68 in Weight today: 311.7 lb BMI: 47.39 kg/m2     Clinical   Pharmacotherapy: History of weight loss medication used: zepbound  has not been approved Medical hx: reflux, asthma Medications: see list   Labs: LDL 126; iron 32; RDW 18.9 Notable signs/symptoms: none noted Any previous deficiencies? No  Lifestyle & Dietary Hx  Pt states she has not smoked for 30 days. Pt states she has hidradenitis, stating she might need surgery on her breasts. Pt states she focuses on protein, to get close to 60 grams per day. Pt states she is not frying food anymore. Pt states she has done well eating through out the day (scheduled meals and snacks) eating smaller servings, and making better choices. Pt states she needs to focus more on tracking her protein. Pt states her ankle is still hurting since Thanksgiving. Pt states she is having a colonoscopy later this month (13th).  Estimated daily fluid intake: 64 oz Supplements: Iron (2x per day, 352 mg) Current average weekly physical activity: treadmill or steps, daily for 30 minutes.  24-Hr Dietary Recall First Meal: two eggs and toast Snack: fruit Second Meal: baked chicken with broccoli or sub with veggies Snack: nuts Third Meal: turkey lasagna or wings corn and macaroni Snack:  Beverages: water   Estimated Energy Needs Calories: 1600  NUTRITION DIAGNOSIS  Overweight/obesity ([place]-3.3) related to past poor dietary habits and physical inactivity as evidenced by patient w/ planned RYGB surgery following dietary guidelines for continued weight loss.  NUTRITION  INTERVENTION  Nutrition counseling (C-1) and education (E-2) to facilitate bariatric surgery goals.  Encouraged patient to honor their body's internal hunger and fullness cues.  Throughout the day, check in mentally and rate hunger. Stop eating when satisfied not full regardless of how much food is left on the plate.  Get more if still hungry 20-30 minutes later.  The key is to honor satisfaction so throughout the meal, rate fullness factor and stop when comfortably satisfied not physically full. The key is to honor hunger and fullness without any feelings of guilt or shame.  Pay attention to what the internal cues are, rather than any external factors. This will enhance the confidence you have in listening to your own body and following those internal cues enabling you to increase how often you eat when you are hungry not out of appetite and stop when you are satisfied not full.  Encouraged pt to continue to eat balanced meals inclusive of non starchy vegetables 2 times a day 7 days a week Encouraged pt to choose lean protein sources: limiting beef, pork, sausage, hotdogs, and lunch meat Encourage pt to choose healthy fats such as plant based limiting animal fats Encouraged pt to continue to drink a minium 64 fluid ounces with half being plain water  to satisfy proper hydration   Purpose of protein: Every cell in your body has protein. Protein is essential for the structure, function and regulation of tissues and organs within the body. Without protein enzymes and antibodies would not exist, and cells would lack storage, transportation, and messenger systems. According to [facility]. [employer]  of Public Health, the body is made up of at least 10000 different proteins. Lack of protein can lead to growth failure in children, loss of muscle mass, decreased immune system function, and overall weakening of various organs in the body.  searchenginecritic.nl,  Doubleproperty.com.cy, pokerprotocol.pl   Pre-Op Goals Progress & New Goals Continue: eat every 3-5 hours; eat within 1-1.5 hours after waking. Continue: physical activity; start back at the gym (every other day); walk with daughter at night. Continue: use meal ideas handout; continue to aim for 2 or more servings of non-starchy vegetables. Continue: practice separating fluids from foods; stop 15 minutes before eating and wait 30 minutes after eating before drinking again; sip instead of gulping/chugging. Continue: aim for feeling of satisfaction to avoid fullness; slow down eating; take about 20-30 minutes to eat; chew well Continue: quit smoking again; no more cigarettes by January Continue: eat your protein first; track protein, aim for 60 grams per day  Handouts Provided Include    Learning Style & Readiness for Change Teaching method utilized: Visual & Auditory  Demonstrated degree of understanding via: Teach Back  Readiness Level: preparation Barriers to learning/adherence to lifestyle change: nothing identified  RD's Notes for next Visit  Pt progress toward chosen goals  MONITORING & EVALUATION Dietary intake, weekly physical activity, body weight, and pre-op goals in 1 month.   Next Steps  Pt has completed visits. No further supervised visits required/recommended. Patient is to return to [facility] for pre-op class >2 weeks prior to scheduled surgery.

## 2023-11-28 NOTE — Progress Notes (Signed)
 Subjective:    Patient ID: [name], female    DOB: 1976/01/24, 48 y.o.   MRN: [id number]  HPI: [name] is a 48 y.o. female who returns for follow up appointment for chronic pain and medication refill. She states her  pain is located in her right hip, left knee and left ankle, she reports left ankle pain began in November, denies falling. She states she seen her PCP for the left ankle pain and she  has a Ortho appointment on 12/02/2023 . She rates  her pain 8. Her current exercise regime is walking and performing stretching exercises.  [name] Morphine equivalent is 30.00 MME.   UDS ordered today.    Pain Inventory Average Pain 9 Pain Right Now 8 My pain is constant, sharp, burning, dull, stabbing, tingling, and aching  In the last 24 hours, has pain interfered with the following? General activity 2 Relation with others 0 Enjoyment of life 0 What TIME of day is your pain at its worst? night Sleep (in general) Fair  Pain is worse with: walking, sitting, and standing Pain improves with: medication and shower Relief from Meds: 9  Family History  Problem Relation Age of Onset   Diabetes Mother    Hypertension Mother    Diabetes Father    Asthma Other    Hyperlipidemia Other    Hypertension Other    Social History   Socioeconomic History   Marital status: Single    Spouse name: Not on file   Number of children: Not on file   Years of education: Not on file   Highest education level: 10th grade  Occupational History   Not on file  Tobacco Use   Smoking status: Some Days    Current packs/day: 0.33    Types: Cigarettes   Smokeless tobacco: Never  Vaping Use   Vaping status: Never Used  Substance and Sexual Activity   Alcohol use: Yes    Comment: once a month at a party beer wine or mixed drinks   Drug use: No   Sexual activity: Yes    Birth control/protection: None  Other Topics Concern   Not on file  Social History Narrative   Not on  file   Social Drivers of Health   Financial Resource Strain: Low Risk  (10/31/2023)   Overall Financial Resource Strain (CARDIA)    Difficulty of Paying Living Expenses: Not hard at all  Food Insecurity: No Food Insecurity (10/31/2023)   Hunger Vital Sign    Worried About Running Out of Food in the Last Year: Never true    Ran Out of Food in the Last Year: Never true  Recent Concern: Food Insecurity - Food Insecurity Present (10/06/2023)   Received from [facility]   Hunger Vital Sign    Worried About Running Out of Food in the Last Year: Sometimes true    Ran Out of Food in the Last Year: Sometimes true  Transportation Needs: No Transportation Needs (10/31/2023)   PRAPARE - Administrator, Civil Service (Medical): No    Lack of Transportation (Non-Medical): No  Physical Activity: Insufficiently Active (10/31/2023)   Exercise Vital Sign    Days of Exercise per Week: 4 days    Minutes of Exercise per Session: 10 min  Stress: No Stress Concern Present (10/31/2023)   [employer] of Occupational Health - Occupational Stress Questionnaire    Feeling of Stress : Only a little  Social Connections:  Moderately Isolated (10/31/2023)   Social Connection and Isolation Panel [NHANES]    Frequency of Communication with Friends and Family: More than three times a week    Frequency of Social Gatherings with Friends and Family: More than three times a week    Attends Religious Services: 1 to 4 times per year    Active Member of [employer] or Organizations: No    Attends Engineer, Structural: Not on file    Marital Status: Divorced   Past Surgical History:  Procedure Laterality Date   CESAREAN SECTION     x2   TUBAL LIGATION     WISDOM TOOTH EXTRACTION  2011   Past Surgical History:  Procedure Laterality Date   CESAREAN SECTION     x2   TUBAL LIGATION     WISDOM TOOTH EXTRACTION  2011   Past Medical History:  Diagnosis Date   Asthma    Hydradenitis     MRSA (methicillin resistant staph aureus) culture positive    Obesity    There were no vitals taken for this visit.  Opioid Risk Score:   Fall Risk Score:  `1  Depression screen [facility] 2/9     10/31/2023    9:57 AM 06/09/2023   10:04 AM 04/25/2023    9:49 AM 03/31/2023   10:06 AM 03/21/2023   10:16 AM 03/03/2023    9:31 AM 01/03/2023    9:15 AM  Depression screen PHQ 2/9  Decreased Interest 0 0 0 0 0 3 0  Down, Depressed, Hopeless 0 0 0 0 0 0 0  PHQ - 2 Score 0 0 0 0 0 3 0  Altered sleeping   0  0 3 0  Tired, decreased energy   0  0 0 0  Change in appetite   0  0 3 0  Feeling bad or failure about yourself        0  Trouble concentrating   0  0 0 0  Moving slowly or fidgety/restless   0  0 0 0  Suicidal thoughts   0  0 0 0  PHQ-9 Score   0  0 9 0    Review of Systems  Musculoskeletal:  Positive for gait problem.       Pain in left knee, left ankle & right hip  All other systems reviewed and are negative.      Objective:   Physical Exam Vitals and nursing note reviewed.  Constitutional:      Appearance: Normal appearance. She is obese.  Cardiovascular:     Rate and Rhythm: Normal rate and regular rhythm.     Pulses: Normal pulses.     Heart sounds: Normal heart sounds.  Pulmonary:     Effort: Pulmonary effort is normal.     Breath sounds: Normal breath sounds.  Musculoskeletal:     Comments: Normal Muscle Bulk and Muscle Testing Reveals:  Upper Extremities: Full ROM and Muscle Strength 5/5 Right Greater Trochanter Tenderness Lower Extremities: Full ROM and Muscle Strength 5/5 Right Lower Extremity Flexion Produces Pain into her Right Hip Left Lower Extremity Flexion Produces Pain into her Left Patella Arises from Table slowly using cane for support Antalgic  Gait     Skin:    General: Skin is warm and dry.  Neurological:     Mental Status: She is alert and oriented to person, place, and time.  Psychiatric:        Mood and Affect: Mood normal.  Behavior:  Behavior normal.         Assessment & Plan:  Primary  Osteoarthritis of Right Hip: Continue HEP as Tolerated. Continue current medication regimen/ Continue to Monitor.  Chronic Pain of Left Knee : Ortho Following. Continue to Monitor. Continue HEP as Tolerated.  Acute Left Ankle Pain: She denies falling. She had a X-ray on 10/31/2023, PCP following. She has a scheduled appointment with Ortho on 12/02/2023. Continue to Monitor.  Morbid Obesity: Continue Healthy Diet Regimen. Continue to Monitor.  Chronic Pain Syndrome: Refilled: Oxycodone  5-325 mg 1-2 two tablets twice a day as needed for pain #120. Second script sent for the following month. We will continue the opioid monitoring program, this consists of regular clinic visits, examinations, urine drug screen, pill counts as well as use of [place]  Controlled Substance Reporting system. A 12 month History has been reviewed on the [place]  Controlled Substance Reporting System on 12/01/2023  F/U in 2 months

## 2023-12-01 ENCOUNTER — Encounter: Payer: 59 | Attending: Physical Medicine and Rehabilitation | Admitting: Registered Nurse

## 2023-12-01 ENCOUNTER — Encounter: Payer: Self-pay | Admitting: Registered Nurse

## 2023-12-01 VITALS — BP 136/83 | HR 75 | Ht 68.0 in | Wt 314.0 lb

## 2023-12-01 DIAGNOSIS — G894 Chronic pain syndrome: Secondary | ICD-10-CM | POA: Diagnosis not present

## 2023-12-01 DIAGNOSIS — Z79891 Long term (current) use of opiate analgesic: Secondary | ICD-10-CM | POA: Insufficient documentation

## 2023-12-01 DIAGNOSIS — M25562 Pain in left knee: Secondary | ICD-10-CM | POA: Insufficient documentation

## 2023-12-01 DIAGNOSIS — Z5181 Encounter for therapeutic drug level monitoring: Secondary | ICD-10-CM | POA: Diagnosis not present

## 2023-12-01 DIAGNOSIS — M25572 Pain in left ankle and joints of left foot: Secondary | ICD-10-CM | POA: Diagnosis not present

## 2023-12-01 DIAGNOSIS — M1611 Unilateral primary osteoarthritis, right hip: Secondary | ICD-10-CM | POA: Diagnosis not present

## 2023-12-01 DIAGNOSIS — G8929 Other chronic pain: Secondary | ICD-10-CM | POA: Insufficient documentation

## 2023-12-01 MED ORDER — OXYCODONE-ACETAMINOPHEN 5-325 MG PO TABS: 1.0000 | ORAL_TABLET | Freq: Two times a day (BID) | ORAL | 0 refills | Status: DC | PRN

## 2023-12-01 MED ORDER — OXYCODONE-ACETAMINOPHEN 5-325 MG PO TABS: 1.0000 | ORAL_TABLET | Freq: Two times a day (BID) | ORAL | 0 refills | Status: AC | PRN

## 2023-12-01 NOTE — Telephone Encounter (Signed)
 [employer] - Prior Authorization NOT required   [facility] [employer] - Prior Authorization REQUIRED

## 2023-12-02 ENCOUNTER — Encounter: Payer: Self-pay | Admitting: Family

## 2023-12-02 ENCOUNTER — Ambulatory Visit: Payer: 59 | Admitting: Family

## 2023-12-02 DIAGNOSIS — M722 Plantar fascial fibromatosis: Secondary | ICD-10-CM

## 2023-12-02 NOTE — Progress Notes (Signed)
 Office Visit Note   Patient: [name]           Date of Birth: January 12, 1976           MRN: [id number] Visit Date: 12/02/2023              Requested by: Elnor Lauraine BRAVO, NP [address] [place],  [place] [address] PCP: Elnor Lauraine BRAVO, NP  Chief Complaint  Patient presents with   Left Ankle - Pain      HPI: The patient is a 48 year old woman who presents today complaining of left lateral ankle and foot pain.  She has some associated Achilles pain 2.  She states this started around Thanksgiving she is unsure whether this has been aggravated by her 32-month-old grandson who likes to stand on her feet  She has been out of work for 24 years.  She reports since she began out of work she has not been wearing supportive shoe wear she has been in slide on sandals for the last 24 years she notes that when she does wear sneakers which she has started to wear an attempt for weight loss in the last few weeks she has more pain  She does use a cane for ambulation  Outside radiographs reviewed  Assessment & Plan: Visit Diagnoses: No diagnosis found.  Plan: Plantar fasciitis left  Discussed conservative measures are encouraged her to wear her supportive shoe wear sneakers daily.  Discussed heel cord stretching.  Use of anti-inflammatories  Follow-Up Instructions: No follow-ups on file.   Ortho Exam  Patient is alert, oriented, no adenopathy, well-dressed, normal affect, normal respiratory effort.  Pes planus. On examination left foot there is tenderness to palpation to the origin of the plantar fascia as well as foot lateral aspect of the calcaneus.  She has no tenderness along the course of the Achilles there is no palpable cord no defect  Imaging: No results found. No images are attached to the encounter.  Labs: Lab Results  Component Value Date   HGBA1C 4.9 01/21/2023   ESRSEDRATE 53 (H) 01/10/2021   LABURIC 3.5 01/21/2023   LABURIC 4.0 01/10/2021     Lab Results   Component Value Date   ALBUMIN 3.5 03/21/2023   ALBUMIN 3.2 (L) 03/12/2023   ALBUMIN 3.8 (L) 01/08/2023    No results found for: MG Lab Results  Component Value Date   VD25OH 17 (L) 11/28/2022   VD25OH 15 (L) 04/15/2022   VD25OH 12 (L) 01/10/2021    No results found for: PREALBUMIN    Latest Ref Rng & Units 06/06/2023   12:10 PM 04/30/2023    9:08 AM 04/25/2023   10:21 AM  CBC EXTENDED  WBC 4.0 - 10.5 K/uL 12.5  14.8  13.0   RBC 3.87 - 5.11 Mil/uL 4.36  4.46  4.61   Hemoglobin 12.0 - 15.0 g/dL 87.7  87.7  87.5   HCT 36.0 - 46.0 % 38.1  40.6  39.7   Platelets 150.0 - 400.0 K/uL 227.0  222  203.0      There is no height or weight on file to calculate BMI.  Orders:  No orders of the defined types were placed in this encounter.  No orders of the defined types were placed in this encounter.    Procedures: No procedures performed  Clinical Data: No additional findings.  ROS:  All other systems negative, except as noted in the HPI. Review of Systems  Objective: Vital Signs: There were  no vitals taken for this visit.  Specialty Comments:  No specialty comments available.  PMFS History: Patient Active Problem List   Diagnosis Date Noted   Constipation 10/31/2023   Acute left ankle pain 10/31/2023   Numbness and tingling of right leg 10/05/2023   Rash 08/01/2023   Abdominal pain 06/06/2023   Iron deficiency 06/06/2023   Murmur 06/06/2023   Fibroids 05/13/2023   Abnormal uterine bleeding 04/25/2023   Chronic pain of left knee 03/31/2023   Class 3 severe obesity due to excess calories with body mass index (BMI) of 45.0 to 49.9 in adult [facility]) 03/21/2023   Hidradenitis suppurativa 03/21/2023   Right lumbar radiculopathy 03/03/2023   Morbid obesity (HCC) 03/03/2023   Chronic pain syndrome 03/03/2023   Encounter for long-term opiate analgesic use 01/29/2023   Primary osteoarthritis of right hip 01/21/2023   Graves disease 07/06/2019   Hyperthyroidism  07/06/2019   Tobacco use disorder 05/26/2019   Perennial and seasonal allergic rhinitis 08/19/2017   Moderate persistent asthma without complication 08/19/2017   Past Medical History:  Diagnosis Date   Asthma    Hydradenitis    MRSA (methicillin resistant staph aureus) culture positive    Obesity     Family History  Problem Relation Age of Onset   Diabetes Mother    Hypertension Mother    Diabetes Father    Asthma Other    Hyperlipidemia Other    Hypertension Other     Past Surgical History:  Procedure Laterality Date   CESAREAN SECTION     x2   TUBAL LIGATION     WISDOM TOOTH EXTRACTION  2011   Social History   Occupational History   Not on file  Tobacco Use   Smoking status: Some Days    Current packs/day: 0.33    Types: Cigarettes   Smokeless tobacco: Never  Vaping Use   Vaping status: Never Used  Substance and Sexual Activity   Alcohol use: Yes    Comment: once a month at a party beer wine or mixed drinks   Drug use: No   Sexual activity: Yes    Birth control/protection: None

## 2023-12-03 LAB — TOXASSURE SELECT,+ANTIDEPR,UR

## 2023-12-04 ENCOUNTER — Ambulatory Visit (INDEPENDENT_AMBULATORY_CARE_PROVIDER_SITE_OTHER): Payer: 59

## 2023-12-04 DIAGNOSIS — J4550 Severe persistent asthma, uncomplicated: Secondary | ICD-10-CM | POA: Diagnosis not present

## 2023-12-05 ENCOUNTER — Other Ambulatory Visit: Payer: Self-pay | Admitting: Nurse Practitioner

## 2023-12-05 ENCOUNTER — Other Ambulatory Visit: Payer: Self-pay | Admitting: Family

## 2023-12-08 ENCOUNTER — Other Ambulatory Visit: Payer: Self-pay | Admitting: Allergy & Immunology

## 2023-12-08 ENCOUNTER — Encounter: Payer: Self-pay | Admitting: Physician Assistant

## 2023-12-08 ENCOUNTER — Ambulatory Visit: Payer: 59 | Admitting: Physician Assistant

## 2023-12-08 ENCOUNTER — Other Ambulatory Visit (INDEPENDENT_AMBULATORY_CARE_PROVIDER_SITE_OTHER): Payer: 59

## 2023-12-08 VITALS — BP 124/80 | HR 76 | Ht 68.0 in | Wt 314.2 lb

## 2023-12-08 DIAGNOSIS — K760 Fatty (change of) liver, not elsewhere classified: Secondary | ICD-10-CM | POA: Diagnosis not present

## 2023-12-08 DIAGNOSIS — E538 Deficiency of other specified B group vitamins: Secondary | ICD-10-CM | POA: Diagnosis not present

## 2023-12-08 DIAGNOSIS — R1012 Left upper quadrant pain: Secondary | ICD-10-CM | POA: Diagnosis not present

## 2023-12-08 DIAGNOSIS — D509 Iron deficiency anemia, unspecified: Secondary | ICD-10-CM | POA: Diagnosis not present

## 2023-12-08 DIAGNOSIS — Z6841 Body Mass Index (BMI) 40.0 and over, adult: Secondary | ICD-10-CM

## 2023-12-08 DIAGNOSIS — K5904 Chronic idiopathic constipation: Secondary | ICD-10-CM

## 2023-12-08 DIAGNOSIS — T402X5A Adverse effect of other opioids, initial encounter: Secondary | ICD-10-CM | POA: Diagnosis not present

## 2023-12-08 DIAGNOSIS — K5903 Drug induced constipation: Secondary | ICD-10-CM | POA: Diagnosis not present

## 2023-12-08 LAB — CBC WITH DIFFERENTIAL/PLATELET
Basophils Absolute: 0 10*3/uL (ref 0.0–0.1)
Basophils Relative: 0.4 % (ref 0.0–3.0)
Eosinophils Absolute: 0 10*3/uL (ref 0.0–0.7)
Eosinophils Relative: 0.3 % (ref 0.0–5.0)
HCT: 39.3 % (ref 36.0–46.0)
Hemoglobin: 12.5 g/dL (ref 12.0–15.0)
Lymphocytes Relative: 10.2 % — ABNORMAL LOW (ref 12.0–46.0)
Lymphs Abs: 1.2 10*3/uL (ref 0.7–4.0)
MCHC: 31.8 g/dL (ref 30.0–36.0)
MCV: 88.5 fL (ref 78.0–100.0)
Monocytes Absolute: 0.5 10*3/uL (ref 0.1–1.0)
Monocytes Relative: 4.4 % (ref 3.0–12.0)
Neutro Abs: 10.3 10*3/uL — ABNORMAL HIGH (ref 1.4–7.7)
Neutrophils Relative %: 84.7 % — ABNORMAL HIGH (ref 43.0–77.0)
Platelets: 221 10*3/uL (ref 150.0–400.0)
RBC: 4.44 Mil/uL (ref 3.87–5.11)
RDW: 15.1 % (ref 11.5–15.5)
WBC: 12.2 10*3/uL — ABNORMAL HIGH (ref 4.0–10.5)

## 2023-12-08 LAB — COMPREHENSIVE METABOLIC PANEL
ALT: 7 U/L (ref 0–35)
AST: 8 U/L (ref 0–37)
Albumin: 3.7 g/dL (ref 3.5–5.2)
Alkaline Phosphatase: 66 U/L (ref 39–117)
BUN: 11 mg/dL (ref 6–23)
CO2: 24 meq/L (ref 19–32)
Calcium: 8.7 mg/dL (ref 8.4–10.5)
Chloride: 106 meq/L (ref 96–112)
Creatinine, Ser: 0.59 mg/dL (ref 0.40–1.20)
GFR: 107.58 mL/min (ref >60.00)
Glucose, Bld: 102 mg/dL — ABNORMAL HIGH (ref 70–99)
Potassium: 3.5 meq/L (ref 3.5–5.1)
Sodium: 138 meq/L (ref 135–145)
Total Bilirubin: 0.3 mg/dL (ref 0.2–1.2)
Total Protein: 7.8 g/dL (ref 6.0–8.3)

## 2023-12-08 LAB — IBC + FERRITIN
Ferritin: 19 ng/mL (ref 10.0–291.0)
Iron: 58 ug/dL (ref 42–145)
Saturation Ratios: 16.4 % — ABNORMAL LOW (ref 20.0–50.0)
TIBC: 354.2 ug/dL (ref 250.0–450.0)
Transferrin: 253 mg/dL (ref 212.0–360.0)

## 2023-12-08 LAB — VITAMIN B12: Vitamin B-12: 190 pg/mL — ABNORMAL LOW (ref 211–911)

## 2023-12-08 MED ORDER — SUTAB 1479-225-188 MG PO TABS: ORAL_TABLET | ORAL | 0 refills | Status: DC

## 2023-12-08 MED ORDER — SUFLAVE 178.7 G PO SOLR: 1.0000 | Freq: Once | ORAL | 0 refills | Status: AC

## 2023-12-08 NOTE — Progress Notes (Signed)
 12/08/2023 [name] [id number] Feb 08, 1976  Referring provider: Elnor Lauraine BRAVO, NP Primary GI doctor: Dr. Albertus  ASSESSMENT AND PLAN:  IDA 06/06/2023  HGB 12.2 MCV 87.3 Platelets 227.0 06/06/2023 Iron 30 Ferritin 28.4 B12 301 Menses improved the last year with BCP, continues to have IDA, preop planning for bariatric surgery Will recheck CBC, iron, and B12 If she continues to have iron def despite iron use BID, consider IV iron.  I recommend upper gastrointestinal and colorectal evaluation with an EGD and colonoscopy.  Risk of bowel prep, conscious sedation, and EGD and colonoscopy were discussed.  Risks include but are not limited to dehydration, pain, bleeding, cardiopulmonary process, bowel perforation, or other possible adverse outcomes..  Treatment plan was discussed with patient, and agreed upon.  Morbid obesity  Body mass index is 47.78 kg/m.  -getting evaluated for bariatric surgery -Recommended diet heavy in fruits and veggies and low in animal meats, cheeses, and dairy products, appropriate calorie intake  Fatty liver Seen on AB US , no liver function abnormalities monitor  Constipation secondary opoid use, LUQ AB pain improved with trulance  - Increase fiber/ water  intake, decrease caffeine, increase activity level. - continue trulance     Patient Care Team: Elnor Lauraine BRAVO, NP as PCP - General (Nurse Practitioner)  HISTORY OF PRESENT ILLNESS: 48 y.o. female with a past medical history of asthma, allergies, Graves' disease, hydradenitis suppurativa, abnormal uterine bleeding, iron deficiency, constipation, morbid obesity and others listed below presents for evaluation of IDA and constipation.   06/06/2023  HGB 12.2 MCV 87.3 Platelets 227.0 06/06/2023 Iron 30 Ferritin 28.4 B12 301 From reviewing charts patient's had iron deficiency for at least the past year 04/25/2023 iron 32, 11/28/2022 iron 13, 06/19/2022 iron 12.  Ferritin improved to 24.2 with oral iron, was  previously 3. 06/17/2023 right upper quadrant abdominal ultrasound for abdominal pain showed no cholelithiasis no inflammation mild fatty liver suggestive of steatosis 06/11/2023 for preop bariatric surgery upper GI series showed normal esophagus no strictures or masses, small sliding hiatal hernia stomach otherwise normal normal emptying.  Patient has history of chronic pain on opioids on Trulance  for constipation. She has been on iron supplements twice a day. Recent Labs    01/08/23 1420 03/12/23 1000 03/21/23 1047 04/25/23 1021 04/30/23 0908 06/06/23 1210  HGB 10.9* 12.4 12.0 12.4 12.2 12.2   Discussed the use of AI scribe software for clinical note transcription with the patient, who gave verbal consent to proceed.  History of Present Illness   The patient, with a history of anemia, iron deficiency, and constipation, presents for evaluation of ongoing symptoms despite treatment. The patient's anemia has been present for an extended period, but menstrual periods have been well-controlled for the past year with birth control. Despite this, the patient's iron levels remain low, suggesting potential issues with absorption/blood loss. The patient is currently on iron supplements twice daily, but admits to occasionally forgetting doses.  The patient has also been experiencing constipation, which has been managed with Trulance . The patient reports that bowel movements have improved since starting this medication, transitioning from hard stools to looser ones. The patient also reports abdominal discomfort, which has improved since starting Trulance  and maintaining regular bowel movements.  The patient is also in the process of preparing for bariatric surgery, specifically a gastric bypass, and has been making dietary changes, including increased vegetable intake. The patient had an upper GI series in preparation for the surgery, which revealed a small sliding hiatal hernia. The patient  also reports  a history of Graves' disease, hidradenitis suppurativa, asthma, and allergies, but these conditions are reportedly well-managed.  The patient is on several medications, including a 28-day birth control pill, iron supplements, Trulance , and oxycodone  for pain management. The patient was previously on Nexium  for GERD but is not currently taking it. The patient also reports taking vitamin D  supplements.    She  reports that she has quit smoking. Her smoking use included cigarettes. She has never used smokeless tobacco. She reports that she does not currently use alcohol. She reports that she does not use drugs.  RELEVANT LABS AND IMAGING:  Results   LABS B12: Low (11/2022)  RADIOLOGY Upper GI series: Tiny sliding hiatal hernia (05/2023) Abdominal ultrasound: Gallbladder normal, hepatic steatosis      CBC    Component Value Date/Time   WBC 12.5 (H) 06/06/2023 1210   RBC 4.36 06/06/2023 1210   HGB 12.2 06/06/2023 1210   HGB 10.9 (L) 01/08/2023 1420   HCT 38.1 06/06/2023 1210   HCT 36.2 01/08/2023 1420   PLT 227.0 06/06/2023 1210   PLT 216 01/08/2023 1420   MCV 87.3 06/06/2023 1210   MCV 74 (L) 01/08/2023 1420   MCH 27.4 04/30/2023 0908   MCHC 32.2 06/06/2023 1210   RDW 17.2 (H) 06/06/2023 1210   LYMPHSABS 1.9 03/12/2023 1000   LYMPHSABS 2.1 01/08/2023 1420   MONOABS 0.6 03/12/2023 1000   EOSABS 0.2 03/12/2023 1000   EOSABS 0.3 01/08/2023 1420   BASOSABS 0.0 03/12/2023 1000   BASOSABS 0.1 01/08/2023 1420   Recent Labs    01/08/23 1420 03/12/23 1000 03/21/23 1047 04/25/23 1021 04/30/23 0908 06/06/23 1210  HGB 10.9* 12.4 12.0 12.4 12.2 12.2    CMP     Component Value Date/Time   NA 138 03/21/2023 1047   NA 139 01/08/2023 1414   K 4.0 03/21/2023 1047   CL 103 03/21/2023 1047   CO2 29 03/21/2023 1047   GLUCOSE 89 03/21/2023 1047   BUN 12 03/21/2023 1047   BUN 15 01/08/2023 1414   CREATININE 0.63 03/21/2023 1047   CREATININE 0.50 04/15/2022 1432   CALCIUM 8.7  03/21/2023 1047   PROT 7.8 03/21/2023 1047   PROT 7.7 01/08/2023 1414   ALBUMIN 3.5 03/21/2023 1047   ALBUMIN 3.8 (L) 01/08/2023 1414   AST 15 03/21/2023 1047   ALT 11 03/21/2023 1047   ALKPHOS 75 03/21/2023 1047   BILITOT 0.2 03/21/2023 1047   BILITOT 0.2 01/08/2023 1414   GFRNONAA >60 03/12/2023 1000   GFRNONAA 102 01/10/2021 0000   GFRAA 118 01/10/2021 0000      Latest Ref Rng & Units 03/21/2023   10:47 AM 03/12/2023   10:00 AM 01/08/2023    2:14 PM  Hepatic Function  Total Protein 6.0 - 8.3 g/dL 7.8  8.1  7.7   Albumin 3.5 - 5.2 g/dL 3.5  3.2  3.8   AST 0 - 37 U/L 15  21  13    ALT 0 - 35 U/L 11  17  8    Alk Phosphatase 39 - 117 U/L 75  80  81   Total Bilirubin 0.2 - 1.2 mg/dL 0.2  <9.8  0.2       Current Medications:   Current Outpatient Medications (Endocrine & Metabolic):    ENSKYCE  0.15-30 MG-MCG tablet, Take 1 tablet by mouth every day   Current Outpatient Medications (Cardiovascular):    EPINEPHRINE  0.3 mg/0.3 mL IJ SOAJ injection, Inject 0.3ml subcutaneously as needed for anaphylaxis  Current Outpatient Medications (Respiratory):    albuterol  (PROAIR  HFA) 108 (90 Base) MCG/ACT inhaler, Inhale 4 puffs every 4-6 hours as needed   albuterol  (PROVENTIL ) (2.5 MG/3ML) 0.083% nebulizer solution, Inhale one vial via nebulizer every 4 hours as needed for wheezing or shortness of breath   albuterol  (VENTOLIN  HFA) 108 (90 Base) MCG/ACT inhaler, Inhale 2 puffs by mouth every 4 hours as needed for wheezing or shortness of breath   albuterol  (VENTOLIN  HFA) 108 (90 Base) MCG/ACT inhaler, Inhale 2 puffs by mouth every 4 hours as needed for wheezing/shortness of breath   albuterol  (VENTOLIN  HFA) 108 (90 Base) MCG/ACT inhaler, Inhale 2 puffs by mouth every 4 hours as needed for wheezing/shortness of breath   albuterol  (VENTOLIN  HFA) 108 (90 Base) MCG/ACT inhaler, Inhale 2 puffs by mouth every 4 hours as needed for wheezing/shortness of breath   Azelastine  HCl 137 MCG/SPRAY SOLN,  Use 2 sprays into each nostril twice daily   fluticasone  (FLONASE ) 50 MCG/ACT nasal spray, Use 1 spray into each nostril every day   levocetirizine (XYZAL ) 5 MG tablet, Take 1 tablet by mouth every day as needed for allergies   montelukast  (SINGULAIR ) 10 MG tablet, Take 1 tablet (10 mg total) by mouth at bedtime.   TRELEGY ELLIPTA  200-62.5-25 MCG/ACT AEPB, USE 1 INHALATION BY MOUTH DAILY  Current Facility-Administered Medications (Respiratory):    tezepelumab -ekko (TEZSPIRE ) 210 MG/1. syringe 210 mg  Current Outpatient Medications (Analgesics):    oxyCODONE -acetaminophen  (PERCOCET/ROXICET) 5-325 MG tablet, Take 1-2 tablets by mouth 2 (two) times daily as needed for severe pain (pain score 7-10).   Current Outpatient Medications (Hematological):    ferrous sulfate  (FEROSUL) 325 (65 FE) MG tablet, Take 1 tab by mouth twice daily, AM & at bedtime   ferrous sulfate  325 (65 FE) MG EC tablet, Take 1 tablet (325 mg total) by mouth in the morning and at bedtime.   Current Outpatient Medications (Other):    amitriptyline (ELAVIL) 25 MG tablet, Take 25 mg by mouth at bedtime.   clindamycin  (CLEOCIN  T) 1 % external solution, Apply to open sores QD after shower.   cromolyn  (OPTICROM ) 4 % ophthalmic solution, Place 1 drop into both eyes 4 (four) times daily.   docusate sodium  (COLACE) 100 MG capsule, Take 100 mg by mouth 2 (two) times daily.   dorzolamide-timolol (COSOPT) 2-0.5 % ophthalmic solution, 1 drop 2 (two) times daily.   ergocalciferol  (VITAMIN D2) 1.25 MG (50000 UT) capsule, Take 50,000 Units by mouth once a week.   esomeprazole  (NEXIUM ) 40 MG capsule, Take 1 capsule by mouth twice daily   fluconazole  (DIFLUCAN ) 150 MG tablet, Take 1 tablet (150 mg total) by mouth daily.   minocycline  (MINOCIN ) 100 MG capsule, Take 1-2 capsules by mouth daily.   mupirocin  ointment (BACTROBAN ) 2 %, Apply to any crusted sores at breasts daily prn   PEG 3350-KCl-NaCl-NaSulf-MgSul (SUFLAVE ) 178.7 g SOLR,  Take 1 each by mouth once for 1 dose.   Plecanatide  (TRULANCE ) 3 MG TABS, Take 1 tablet (3 mg total) by mouth daily.   polyethylene glycol (MIRALAX / GLYCOLAX) 17 g packet, Take 17 g by mouth daily.   Secukinumab  (COSENTYX  UNOREADY) 300 MG/2ML SOAJ, Inject 300 mg into the skin every 28 (twenty-eight) days.   Sodium Sulfate-Mag Sulfate-KCl (SUTAB ) 5101116892 MG TABS, Use as directed for colonoscopy. MANUFACTURER CODES!! BIN: [id number] PCN: CN GROUP: [id number] MEMBER ID: [id number];MLW AS SECONDARY INSURANCE ;NO PRIOR AUTHORIZATION   Spacer/Aero-Holding Chambers (AEROCHAMBER PLUS) inhaler, Use as instructed   triamcinolone  ointment (KENALOG )  0.1 %, Apply topically to itchy areas at breast qd/bid prn for rash. Avoid skin under breast   Medical History:  Past Medical History:  Diagnosis Date   Asthma    Hydradenitis    MRSA (methicillin resistant staph aureus) culture positive    Obesity    Allergies:  Allergies  Allergen Reactions   Shellfish Allergy    Shellfish-Derived Products Hives, Itching and Swelling   Minocycline  Other (See Comments)    Skin hyperpigmentation.  Can tolerate doxycycline  (it seems)  Other Reaction(s): Other (See Comments)  Skin hyperpigmentation. Can tolerate doxycycline  (it seems)     Surgical History:  She  has a past surgical history that includes Cesarean section; Wisdom tooth extraction (2011); and Tubal ligation. Family History:  Her family history includes Asthma in an other family member; Diabetes in her father and mother; Hyperlipidemia in an other family member; Hypertension in her mother and another family member.  REVIEW OF SYSTEMS  : All other systems reviewed and negative except where noted in the History of Present Illness.  PHYSICAL EXAM: BP 124/80 (BP Location: Left Arm, Patient Position: Sitting, Cuff Size: Large)   Pulse 76   Ht 5' 8 (1.727 m)   Wt (!) 314 lb 4 oz (142.5 kg)   SpO2 97%   BMI 47.78 kg/m  General Appearance: Well  nourished, in no apparent distress. Head:   Normocephalic and atraumatic. Eyes:  sclerae anicteric,conjunctive pink  Respiratory: Respiratory effort normal, BS equal bilaterally without rales, rhonchi, wheezing. Cardio: RRR with no MRGs. Peripheral pulses intact.  Abdomen: Soft,  Obese ,active bowel sounds. No tenderness . No masses. Rectal: Not evaluated Musculoskeletal: Full ROM, Antalgic gait. Without edema. Skin:  Dry and intact without significant lesions or rashes Neuro: Alert and  oriented x4;  No focal deficits. Psych:  Cooperative. Normal mood and affect.    Alan JONELLE Coombs, PA-C 10:33 AM

## 2023-12-08 NOTE — Patient Instructions (Addendum)
 You have been scheduled for an endoscopy/colonoscopy. Please follow written instructions given to you at your visit today.  If you use inhalers (even only as needed), please bring them with you on the day of your procedure.  If you take any of the following medications, they will need to be adjusted prior to your procedure:   DO NOT TAKE 7 DAYS PRIOR TO TEST- Trulicity (dulaglutide) Ozempic , Wegovy  (semaglutide ) Mounjaro  (tirzepatide ) Bydureon Bcise (exanatide extended release)  DO NOT TAKE 1 DAY PRIOR TO YOUR TEST Rybelsus  (semaglutide ) Adlyxin (lixisenatide) Victoza (liraglutide) Byetta (exanatide) ___________________________________________________________________________     Your provider has requested that you go to the basement level for lab work before leaving today. Press B on the elevator. The lab is located at the first door on the left as you exit the elevator.  If your iron is low still despite twice a day medication  B12 is mainly in meat, so increase meat can help this but often people have a deficiency that they are just not absorbing it well with meat or pills, so get the sublingual/melt in your mouth one.   If the sublingual one dose not increase your level, we will discuss shots.   Vitamin B12 Deficiency Vitamin B12 deficiency occurs when the body does not have enough vitamin B12, which is an important vitamin. The body needs this vitamin: To make red blood cells. To make DNA. This is the genetic material inside cells. To help the nerves work properly so they can carry messages from the brain to the body. Vitamin B12 deficiency can cause various health problems, such as a low red blood cell count (anemia) or nerve damage. What are the causes? This condition may be caused by: Not eating enough foods that contain vitamin B12. Not having enough stomach acid and digestive fluids to properly absorb vitamin B12 from the food that you eat. Certain digestive system  diseases that make it hard to absorb vitamin B12. These diseases include Crohn's disease, chronic pancreatitis, and cystic fibrosis. A condition in which the body does not make enough of a protein (intrinsic factor), resulting in too few red blood cells (pernicious anemia). Having a surgery in which part of the stomach or small intestine is removed. Taking certain medicines that make it hard for the body to absorb vitamin B12. These medicines include: Heartburn medicines (antacids and proton pump inhibitors). Certain antibiotic medicines. Some medicines that are used to treat diabetes, tuberculosis, gout, or high cholesterol. What increases the risk? The following factors may make you more likely to develop a B12 deficiency: Being older than age 103. Eating a vegetarian or vegan diet, especially while you are pregnant. Eating a poor diet while you are pregnant. Taking certain medicines. Having alcoholism. What are the signs or symptoms? In some cases, there are no symptoms of this condition. If the condition leads to anemia or nerve damage, various symptoms can occur, such as: Weakness. Fatigue. Loss of appetite. Weight loss. Numbness or tingling in your hands and feet. Redness and burning of the tongue. Confusion or memory problems. Depression. Sensory problems, such as color blindness, ringing in the ears, or loss of taste. Diarrhea or constipation. Trouble walking. If anemia is severe, symptoms can include: Shortness of breath. Dizziness. Rapid heart rate (tachycardia). How is this diagnosed? This condition may be diagnosed with a blood test to measure the level of vitamin B12 in your blood. You may also have other tests, including: A group of tests that measure certain characteristics of blood  cells (complete blood count, CBC). A blood test to measure intrinsic factor. A procedure where a thin tube with a camera on the end is used to look into your stomach or intestines  (endoscopy). Other tests may be needed to discover the cause of B12 deficiency. How is this treated? Treatment for this condition depends on the cause. This condition may be treated by: Changing your eating and drinking habits, such as: Eating more foods that contain vitamin B12. Drinking less alcohol or no alcohol. Getting vitamin B12 injections. Taking vitamin B12 supplements. Your health care provider will tell you which dosage is best for you. Follow these instructions at home: Eating and drinking  Eat lots of healthy foods that contain vitamin B12, including: Meats and poultry. This includes beef, pork, chicken, turkey, and organ meats, such as liver. Seafood. This includes clams, rainbow trout, salmon, tuna, and haddock. Eggs. Cereal and dairy products that are fortified. This means that vitamin B12 has been added to the food. Check the label on the package to see if the food is fortified. The items listed above may not be a complete list of recommended foods and beverages. Contact a dietitian for more information. General instructions Get any injections that are prescribed by your health care provider. Take supplements only as told by your health care provider. Follow the directions carefully. Do not drink alcohol if your health care provider tells you not to. In some cases, you may only be asked to limit alcohol use. Keep all follow-up visits as told by your health care provider. This is important. Contact a health care provider if: Your symptoms come back. Get help right away if you: Develop shortness of breath. Have a rapid heart rate. Have chest pain. Become dizzy or lose consciousness. Summary Vitamin B12 deficiency occurs when the body does not have enough vitamin B12. The main causes of vitamin B12 deficiency include dietary deficiency, digestive diseases, pernicious anemia, and having a surgery in which part of the stomach or small intestine is removed. In some cases,  there are no symptoms of this condition. If the condition leads to anemia or nerve damage, various symptoms can occur, such as weakness, shortness of breath, and numbness. Treatment may include getting vitamin B12 injections or taking vitamin B12 supplements. Eat lots of healthy foods that contain vitamin B12. This information is not intended to replace advice given to you by your health care provider. Make sure you discuss any questions you have with your health care provider. Document Revised: 04/30/2019 Document Reviewed: 07/21/2018 Elsevier Patient Education  2020 [employer].

## 2023-12-08 NOTE — Progress Notes (Signed)
   LILLETTE Ileana Collet, PhD, LAT, ATC acting as a scribe for Artist Lloyd, MD.  [name] is a 48 y.o. female who presents to [facility] today for cont'd L knee pain. Pt was last seen by Dr. Lloyd on 09/01/23 L knee Zilretta  injection.  Today, pt reports L knee pain returned end of last wk. She reports the prior injection was very helpful.   She is in process of being worked up for bariatric surgery.  She has a colonoscopy in March and after that will be scheduled for bariatric surgery anticipated in April.  Dx imaging: 04/20/23 L knee MRI 03/25/23 L knee XR  Pertinent review of systems: .  No fevers or chills  Relevant historical information: Morbid obesity   Exam:  BP 124/80   Pulse 76   Ht 5' 8 (1.727 m)   Wt (!) 312 lb (141.5 kg)   SpO2 97%   BMI 47.44 kg/m  General: Well Developed, well nourished, and in no acute distress.   MSK: Knee moderate effusion    Lab and Radiology Results   Zilretta  injection left knee Procedure: Real-time Ultrasound Guided Injection of left knee joint superior lateral patellar space Device: Philips Affiniti 50G Images permanently stored and available for review in PACS Verbal informed consent obtained.  Discussed risks and benefits of procedure. Warned about infection, hyperglycemia bleeding, damage to structures among others. Patient expresses understanding and agreement Time-out conducted.   Noted no overlying erythema, induration, or other signs of local infection.   Skin prepped in a sterile fashion.   Local anesthesia: Topical Ethyl chloride.   With sterile technique and under real time ultrasound guidance: Zilretta  32 mg injected into knee joint. Fluid seen entering the joint capsule.   Completed without difficulty   Advised to call if fevers/chills, erythema, induration, drainage, or persistent bleeding.   Images permanently stored and available for review in the ultrasound unit.  Impression:  Technically successful ultrasound guided injection.  Lot number: [id number] Patient provided Zilretta  through the pharmacy benefit.     Assessment and Plan: 48 y.o. 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  Plan for repeat Zilretta  injection today.  She is in the process of being set up for bariatric surgery which should be quite helpful for her knee pain.   PDMP not reviewed this encounter. Orders Placed This Encounter  Procedures   US  LIMITED JOINT SPACE STRUCTURES LOW LEFT(NO LINKED CHARGES)    Reason for Exam (SYMPTOM  OR DIAGNOSIS REQUIRED):   left knee pain    Preferred imaging location?:   [facility] ordered this encounter  Medications   Triamcinolone  Acetonide (ZILRETTA ) intra-articular injection 32 mg     Discussed warning signs or symptoms. Please see discharge instructions. Patient expresses understanding.   The above documentation has been reviewed and is accurate and complete Artist Lloyd, M.D.

## 2023-12-09 ENCOUNTER — Other Ambulatory Visit: Payer: Self-pay

## 2023-12-09 ENCOUNTER — Ambulatory Visit (INDEPENDENT_AMBULATORY_CARE_PROVIDER_SITE_OTHER): Payer: 59 | Admitting: Family Medicine

## 2023-12-09 VITALS — BP 124/80 | HR 76 | Ht 68.0 in | Wt 312.0 lb

## 2023-12-09 DIAGNOSIS — G8929 Other chronic pain: Secondary | ICD-10-CM | POA: Diagnosis not present

## 2023-12-09 DIAGNOSIS — M25562 Pain in left knee: Secondary | ICD-10-CM

## 2023-12-09 DIAGNOSIS — M1712 Unilateral primary osteoarthritis, left knee: Secondary | ICD-10-CM

## 2023-12-09 DIAGNOSIS — D509 Iron deficiency anemia, unspecified: Secondary | ICD-10-CM

## 2023-12-09 MED ORDER — TRIAMCINOLONE ACETONIDE 32 MG IX SRER
32.0000 mg | Freq: Once | INTRA_ARTICULAR | Status: AC
  Administered 2023-12-09: 32 mg via INTRA_ARTICULAR

## 2023-12-09 NOTE — Patient Instructions (Signed)
 Thank you for coming in today.   You received an injection today. Seek immediate medical attention if the joint becomes red, extremely painful, or is oozing fluid.

## 2023-12-10 ENCOUNTER — Telehealth: Payer: Self-pay | Admitting: Physician Assistant

## 2023-12-10 NOTE — Telephone Encounter (Signed)
 Patient called and stated that she went to go pick up her prep medication and that we had sent her a tablet form and a liquid form. Patient stated she only wants the liquid form and that when she was picking up the liquid form the want ed to charge her 100 for the prep, when the prep medication should have been free. Patient is requesting a call back. Please advise.

## 2023-12-12 ENCOUNTER — Other Ambulatory Visit: Payer: Self-pay | Admitting: Family

## 2023-12-12 ENCOUNTER — Ambulatory Visit: Payer: 59

## 2023-12-12 NOTE — Telephone Encounter (Signed)
Patient called stating she needs prep sent to [facility] on [address] at [address]. Please advise.

## 2023-12-13 NOTE — Progress Notes (Signed)
Addendum: Reviewed and agree with assessment and management plan. Kadijah Shamoon M, MD  

## 2023-12-15 ENCOUNTER — Ambulatory Visit (INDEPENDENT_AMBULATORY_CARE_PROVIDER_SITE_OTHER): Payer: 59 | Admitting: Nurse Practitioner

## 2023-12-15 VITALS — BP 120/82 | HR 76 | Temp 98.3°F | Ht 68.0 in | Wt 313.2 lb

## 2023-12-15 DIAGNOSIS — E538 Deficiency of other specified B group vitamins: Secondary | ICD-10-CM

## 2023-12-15 DIAGNOSIS — K5904 Chronic idiopathic constipation: Secondary | ICD-10-CM

## 2023-12-15 MED ORDER — CYANOCOBALAMIN 1000 MCG/ML IJ SOLN
1000.0000 ug | Freq: Once | INTRAMUSCULAR | Status: AC
  Administered 2023-12-15: 1000 ug via INTRAMUSCULAR

## 2023-12-15 MED ORDER — CYANOCOBALAMIN 1000 MCG/ML IJ SOLN: 1000.0000 ug | Freq: Once | INTRAMUSCULAR | Status: DC

## 2023-12-15 NOTE — Addendum Note (Signed)
Addended by: Cathleen Fears, Fatih Stalvey P on: 12/15/2023 10:04 AM   Modules accepted: Orders

## 2023-12-15 NOTE — Assessment & Plan Note (Signed)
Chronic Improved with addition of trulance and continued use of Ryze coffee. Continue with current treatment and follow-up with GI as scheduled.

## 2023-12-15 NOTE — Assessment & Plan Note (Signed)
Chronic IM injection of B12 administered today. Patient will f/u monthly for more IM B12 injections x 3 then we will check serum level at her annual physical in May 2025.  Labs ordered, further recommendations may be made based upon his results

## 2023-12-15 NOTE — Progress Notes (Signed)
   Established Patient Office Visit  Subjective   Patient ID: [name], female    DOB: [birth year]  Age: 48 y.o. MRN: [id number]  Chief Complaint  Patient presents with   Medical Management of Chronic Issues    B12    Vitamin B12 Deficiency/Constipation: Recently went to GI, vitamin B12 deficiency with serum level of 190 identified. She is here today to discuss treatment options. She has been taking Trulance to help with her constipation and feels it has been helpful. No other concerns today.     ROS: see HPI    Objective:     BP 120/82   Pulse 76   Temp 98.3 F (36.8 C) (Temporal)   Ht 5\' 8"  (1.727 m)   Wt (!) 313 lb 4 oz (142.1 kg)   SpO2 98%   BMI 47.63 kg/m  BP Readings from Last 3 Encounters:  12/15/23 120/82  12/09/23 124/80  12/08/23 124/80   Wt Readings from Last 3 Encounters:  12/15/23 (!) 313 lb 4 oz (142.1 kg)  12/09/23 (!) 312 lb (141.5 kg)  12/08/23 (!) 314 lb 4 oz (142.5 kg)      Physical Exam Vitals reviewed.  Constitutional:      General: She is not in acute distress.    Appearance: Normal appearance.  HENT:     Head: Normocephalic and atraumatic.  Neck:     Vascular: No carotid bruit.  Cardiovascular:     Rate and Rhythm: Normal rate and regular rhythm.     Pulses: Normal pulses.     Heart sounds: Normal heart sounds.  Pulmonary:     Effort: Pulmonary effort is normal.     Breath sounds: Normal breath sounds.  Skin:    General: Skin is warm and dry.  Neurological:     General: No focal deficit present.     Mental Status: She is alert and oriented to person, place, and time.  Psychiatric:        Mood and Affect: Mood normal.        Behavior: Behavior normal.        Judgment: Judgment normal.      No results found for any visits on 12/15/23.    The 10-year ASCVD risk score (Arnett DK, et al., 2019) is: 1.1%    Assessment & Plan:   Problem List Items Addressed This Visit       Other   Constipation    Chronic Improved with addition of trulance and continued use of Ryze coffee. Continue with current treatment and follow-up with GI as scheduled.       Vitamin B12 deficiency - Primary   Chronic IM injection of B12 administered today. Patient will f/u monthly for more IM B12 injections x 3 then we will check serum level at her annual physical in May 2025.  Labs ordered, further recommendations may be made based upon his results       Relevant Medications   cyanocobalamin (VITAMIN B12) injection 1,000 mcg   Other Relevant Orders   Intrinsic Factor Antibodies   Anti-Parietal Antibody    Return for Monthly vitamin B12 injection x 3; f/u with Maralyn Sago in May as scheduled.    Elenore Paddy, NP

## 2023-12-16 ENCOUNTER — Other Ambulatory Visit: Payer: Self-pay

## 2023-12-16 MED ORDER — SUFLAVE 178.7 G PO SOLR: 1.0000 | Freq: Once | ORAL | 0 refills | Status: AC

## 2023-12-16 NOTE — Telephone Encounter (Signed)
Patient called stating she needs prep sent to [facility] on [address] at [address]. Please advise.

## 2023-12-17 NOTE — Telephone Encounter (Signed)
Pt received Zilretta injection for LEFT knee OA on 12/08/22.  Can consider repeat injections on or after 03/03/24.

## 2023-12-18 DIAGNOSIS — Z1159 Encounter for screening for other viral diseases: Secondary | ICD-10-CM | POA: Diagnosis not present

## 2023-12-18 DIAGNOSIS — L732 Hidradenitis suppurativa: Secondary | ICD-10-CM | POA: Diagnosis not present

## 2023-12-18 DIAGNOSIS — Z7289 Other problems related to lifestyle: Secondary | ICD-10-CM | POA: Diagnosis not present

## 2023-12-18 DIAGNOSIS — Z79899 Other long term (current) drug therapy: Secondary | ICD-10-CM | POA: Diagnosis not present

## 2023-12-29 NOTE — Telephone Encounter (Signed)
 VOB initiated for Zilretta for LEFT knee OA

## 2024-01-06 ENCOUNTER — Ambulatory Visit (INDEPENDENT_AMBULATORY_CARE_PROVIDER_SITE_OTHER): Payer: 59 | Admitting: Dermatology

## 2024-01-06 ENCOUNTER — Other Ambulatory Visit: Payer: Self-pay | Admitting: Dermatology

## 2024-01-06 ENCOUNTER — Ambulatory Visit: Payer: 59 | Admitting: *Deleted

## 2024-01-06 DIAGNOSIS — J4550 Severe persistent asthma, uncomplicated: Secondary | ICD-10-CM | POA: Diagnosis not present

## 2024-01-06 DIAGNOSIS — L2089 Other atopic dermatitis: Secondary | ICD-10-CM

## 2024-01-06 DIAGNOSIS — Z79899 Other long term (current) drug therapy: Secondary | ICD-10-CM | POA: Diagnosis not present

## 2024-01-06 DIAGNOSIS — L209 Atopic dermatitis, unspecified: Secondary | ICD-10-CM

## 2024-01-06 DIAGNOSIS — L732 Hidradenitis suppurativa: Secondary | ICD-10-CM | POA: Diagnosis not present

## 2024-01-06 MED ORDER — PIMECROLIMUS 1 % EX CREA: TOPICAL_CREAM | Freq: Two times a day (BID) | CUTANEOUS | 2 refills | Status: DC

## 2024-01-06 MED ORDER — FLUCONAZOLE 150 MG PO TABS: 150.0000 mg | ORAL_TABLET | ORAL | 2 refills | Status: DC

## 2024-01-06 NOTE — Progress Notes (Signed)
Follow-Up Visit   Subjective  [name] is a 48 y.o. female who presents for the following: HS Cosentyx 300mg /78ml sq injections q month, Clindamycin lotion qd, Lowry Bowl PA at [facility] started pt on Cefdinir 300mg  bid and D/C Doxycycline, they plan to d/c Cosentyx and start Infliximab infusions, dry skin face, ~72m, using Eucerin, hx of Asthma   The following portions of the chart were reviewed this encounter and updated as appropriate: medications, allergies, medical history  Review of Systems:  No other skin or systemic complaints except as noted in HPI or Assessment and Plan.  Objective  Well appearing patient in no apparent distress; mood and affect are within normal limits.    A focused examination was performed of the following areas: Intermammary, inframammary, breast, axilla  Relevant exam findings are noted in the Assessment and Plan.    Assessment & Plan   HIDRADENITIS SUPPURATIVA Inframammary, intermammary, breast, axilla Exam: multiple inflamed nodules some draining breast, intermammary, inframammary  hyperpigmented plaques and nodules in same areas  Chronic and persistent condition with duration or expected duration over one year. Condition is bothersome/symptomatic for patient. Currently flared.   Hidradenitis Suppurativa is a chronic; persistent; non-curable, but treatable condition due to abnormal inflamed sweat glands in the body folds (axilla, inframammary, groin, medial thighs), causing recurrent painful draining cysts and scarring. It can be associated with severe scarring acne and cysts; also abscesses and scarring of scalp. The goal is control and prevention of flares, as it is not curable. Scars are permanent and can be thickened. Treatment may include daily use of topical medication and oral antibiotics.  Oral isotretinoin may also be helpful.  For some cases, Humira or Cosentyx (biologic injections) may be prescribed to decrease the inflammatory  process and prevent flares.  When indicated, inflamed cysts may also be treated surgically.  Treatment Plan: Cont Fluconazole 150mg  1 po q wk as directed prn yeast infections  Cont Cefdinir 300mg  1 po bid as prescribed by [facility] Cont Clindamycin solution qd aa HS Cont Cosentyx 300mg  sq injections until starting Infliximab infusions prescribed by [facility]   Reviewed risks of biologics including immunosuppression, infections, injection site reaction, and failure to improve condition. Goal is control of skin condition, not cure.  Some older biologics such as Humira and Enbrel may slightly increase risk of malignancy and may worsen congestive heart failure.  Taltz and Cosentyx may cause inflammatory bowel disease to flare. The use of biologics requires long term medication management, including periodic office visits and monitoring of blood work.  Side effects of fluconazole (diflucan) include nausea, diarrhea, headache, dizziness, taste changes, rare risk of irritation of the liver, allergy, or decreased blood counts (which could show up as infection or tiredness).   ATOPIC DERMATITIS Exam: xerosis chin, cheeks, hyperpigmented scaly patch L third finger  Chronic and persistent condition with duration or expected duration over one year. Condition is bothersome/symptomatic for patient. Currently flared.   Atopic dermatitis (eczema) is a chronic, relapsing, pruritic condition that can significantly affect quality of life. It is often associated with allergic rhinitis and/or asthma and can require treatment with topical medications, phototherapy, or in severe cases biologic injectable medication (Dupixent; Adbry) or Oral JAK inhibitors.   Treatment Plan: Start Elidel cr bid to aa face and hands prn flares   Recommend mild soap and moisturizing cream 1-2 times daily.    HIDRADENITIS SUPPURATIVA   Related Medications minocycline (MINOCIN) 100 MG capsule Take 1-2 capsules by mouth daily. clindamycin  (CLEOCIN T)  1 % external solution Apply to open sores QD after shower. triamcinolone ointment (KENALOG) 0.1 % Apply topically to itchy areas at breast qd/bid prn for rash. Avoid skin under breast mupirocin ointment (BACTROBAN) 2 % Apply to any crusted sores at breasts daily prn Secukinumab (COSENTYX UNOREADY) 300 MG/2ML SOAJ Inject 300 mg into the skin every 28 (twenty-eight) days. fluconazole (DIFLUCAN) 150 MG tablet Take 1 tablet (150 mg total) by mouth once a week.  Return in about 6 months (around 07/05/2024) for HS.  I, Ardis Rowan, RMA, am acting as scribe for Willeen Niece, MD .   Documentation: I have reviewed the above documentation for accuracy and completeness, and I agree with the above.  Willeen Niece, MD

## 2024-01-06 NOTE — Patient Instructions (Signed)

## 2024-01-14 ENCOUNTER — Other Ambulatory Visit: Payer: Self-pay

## 2024-01-14 NOTE — Telephone Encounter (Signed)
Rochelle called from [facility] regarding Azelastine HCL spray soloution requesting refill due to expiring from Dr Marlynn Perking. Unfortunetly pt last seen April 2024 and needed a follow up in 6 months [facility] [place] [address] with 6 months of medications please have patients contact us.

## 2024-01-15 ENCOUNTER — Other Ambulatory Visit: Payer: Self-pay | Admitting: Nurse Practitioner

## 2024-01-15 ENCOUNTER — Ambulatory Visit: Payer: 59

## 2024-01-15 ENCOUNTER — Encounter: Payer: Self-pay | Admitting: Internal Medicine

## 2024-01-15 DIAGNOSIS — K5900 Constipation, unspecified: Secondary | ICD-10-CM

## 2024-01-19 ENCOUNTER — Ambulatory Visit (INDEPENDENT_AMBULATORY_CARE_PROVIDER_SITE_OTHER): Payer: 59

## 2024-01-19 DIAGNOSIS — E538 Deficiency of other specified B group vitamins: Secondary | ICD-10-CM | POA: Diagnosis not present

## 2024-01-19 MED ORDER — CYANOCOBALAMIN 1000 MCG/ML IJ SOLN
1000.0000 ug | Freq: Once | INTRAMUSCULAR | Status: AC
  Administered 2024-01-19: 1000 ug via INTRAMUSCULAR

## 2024-01-19 NOTE — Progress Notes (Signed)
 After obtaining consent, and per orders of Dr. Yetta Barre, injection of B12 given by Ferdie Ping. Patient instructed to report any adverse reaction to me immediately.

## 2024-01-20 ENCOUNTER — Telehealth: Payer: Self-pay | Admitting: Allergy & Immunology

## 2024-01-20 ENCOUNTER — Other Ambulatory Visit: Payer: Self-pay

## 2024-01-20 MED ORDER — ELIDEL 1 % EX CREA: TOPICAL_CREAM | CUTANEOUS | 3 refills | Status: AC

## 2024-01-20 NOTE — Telephone Encounter (Signed)
 Patient called stating she needs a nebulizer mask sent to [facility] on [address] [facility].

## 2024-01-20 NOTE — Progress Notes (Signed)
 Pimecrolimus not covered by insurance

## 2024-01-21 MED ORDER — NEBULIZER MASK ADULT/TUBING MISC: 1.0000 | Freq: Every day | 1 refills | Status: AC

## 2024-01-21 NOTE — Progress Notes (Signed)
After obtaining consent, and per orders of  Sarah Gray NP, injection of B12 given by Ilyanna Baillargeon P Kanaan Kagawa. Patient instructed to report any adverse reaction to me immediately.  

## 2024-01-21 NOTE — Telephone Encounter (Signed)
 Pt seen 5 months ago sent in neb tubing and mask to [address] [facility]

## 2024-01-26 ENCOUNTER — Ambulatory Visit: Payer: 59 | Admitting: Internal Medicine

## 2024-01-26 ENCOUNTER — Encounter: Payer: Self-pay | Admitting: Internal Medicine

## 2024-01-26 ENCOUNTER — Encounter: Payer: Self-pay | Admitting: Registered Nurse

## 2024-01-26 ENCOUNTER — Encounter: Payer: 59 | Attending: Physical Medicine and Rehabilitation | Admitting: Registered Nurse

## 2024-01-26 VITALS — BP 122/88 | HR 99 | Temp 97.7°F | Resp 18 | Ht 68.0 in | Wt 314.0 lb

## 2024-01-26 VITALS — BP 141/84 | HR 66 | Ht 68.0 in | Wt 306.0 lb

## 2024-01-26 DIAGNOSIS — Z5181 Encounter for therapeutic drug level monitoring: Secondary | ICD-10-CM | POA: Diagnosis not present

## 2024-01-26 DIAGNOSIS — K6289 Other specified diseases of anus and rectum: Secondary | ICD-10-CM

## 2024-01-26 DIAGNOSIS — K3189 Other diseases of stomach and duodenum: Secondary | ICD-10-CM

## 2024-01-26 DIAGNOSIS — K529 Noninfective gastroenteritis and colitis, unspecified: Secondary | ICD-10-CM | POA: Diagnosis not present

## 2024-01-26 DIAGNOSIS — K5730 Diverticulosis of large intestine without perforation or abscess without bleeding: Secondary | ICD-10-CM | POA: Diagnosis not present

## 2024-01-26 DIAGNOSIS — K2950 Unspecified chronic gastritis without bleeding: Secondary | ICD-10-CM

## 2024-01-26 DIAGNOSIS — K449 Diaphragmatic hernia without obstruction or gangrene: Secondary | ICD-10-CM | POA: Diagnosis not present

## 2024-01-26 DIAGNOSIS — G894 Chronic pain syndrome: Secondary | ICD-10-CM | POA: Diagnosis not present

## 2024-01-26 DIAGNOSIS — D509 Iron deficiency anemia, unspecified: Secondary | ICD-10-CM | POA: Diagnosis not present

## 2024-01-26 DIAGNOSIS — K50119 Crohn's disease of large intestine with unspecified complications: Secondary | ICD-10-CM

## 2024-01-26 DIAGNOSIS — M5416 Radiculopathy, lumbar region: Secondary | ICD-10-CM | POA: Diagnosis not present

## 2024-01-26 DIAGNOSIS — Z79891 Long term (current) use of opiate analgesic: Secondary | ICD-10-CM | POA: Insufficient documentation

## 2024-01-26 MED ORDER — SODIUM CHLORIDE 0.9 % IV SOLN: 500.0000 mL | Freq: Once | INTRAVENOUS | Status: DC

## 2024-01-26 MED ORDER — OXYCODONE-ACETAMINOPHEN 5-325 MG PO TABS
1.0000 | ORAL_TABLET | Freq: Two times a day (BID) | ORAL | 0 refills | Status: DC
Start: 2024-01-26 — End: 2024-01-26

## 2024-01-26 MED ORDER — OXYCODONE-ACETAMINOPHEN 5-325 MG PO TABS: 1.0000 | ORAL_TABLET | Freq: Two times a day (BID) | ORAL | 0 refills | Status: DC

## 2024-01-26 NOTE — Progress Notes (Unsigned)
 To pacu, VSS. Report to [email or web address]

## 2024-01-26 NOTE — Op Note (Signed)
 [facility] Patient Name: [name] Procedure Date: 01/26/2024 2:13 PM MRN: [id number] Endoscopist: Beverley Fiedler , MD, [id number] Age: 48 Referring MD:  Date of Birth: January 04, 1976 Gender: Female Account #: [id number] Procedure:                Colonoscopy Indications:              This is the patient's first colonoscopy, Iron                            deficiency anemia Medicines:                Monitored Anesthesia Care Procedure:                Pre-Anesthesia Assessment:                           - Prior to the procedure, a History and Physical                            was performed, and patient medications and                            allergies were reviewed. The patient's tolerance of                            previous anesthesia was also reviewed. The risks                            and benefits of the procedure and the sedation                            options and risks were discussed with the patient.                            All questions were answered, and informed consent                            was obtained. Prior Anticoagulants: The patient has                            taken no anticoagulant or antiplatelet agents. ASA                            Grade Assessment: III - A patient with severe                            systemic disease. After reviewing the risks and                            benefits, the patient was deemed in satisfactory                            condition to undergo the procedure.  After obtaining informed consent, the colonoscope                            was passed under direct vision. Throughout the                            procedure, the patient's blood pressure, pulse, and                            oxygen saturations were monitored continuously. The                            Colonoscope was introduced through the anus and                            advanced to the cecum, identified by  appendiceal                            orifice and ileocecal valve. The colonoscopy was                            performed without difficulty. The patient tolerated                            the procedure well. The quality of the bowel                            preparation was good. The ileocecal valve,                            appendiceal orifice, and rectum were photographed. Scope In: 2:43:59 PM Scope Out: 3:01:07 PM Scope Withdrawal Time: 0 hours 12 minutes 8 seconds  Total Procedure Duration: 0 hours 17 minutes 8 seconds  Findings:                 The digital rectal exam was normal.                           Multiple medium-mouthed and small-mouthed                            diverticula were found in the sigmoid colon,                            descending colon and ascending colon.                           Segmental moderate mucosal changes characterized by                            congestion (edema), erythema and loss of                            vascularity were found in the sigmoid colon.  Biopsies were taken with a cold forceps for                            histology.                           Retroflexion in the rectum was not performed due                            narrow rectal vault. Complications:            No immediate complications. Estimated Blood Loss:     Estimated blood loss was minimal. Impression:               - Moderate diverticulosis in the sigmoid colon, in                            the descending colon and in the ascending colon.                           - Segmental moderate mucosal changes were found in                            the sigmoid colon, rule out inflammatory bowel                            disease (versus SCAD). Biopsied. Recommendation:           - Patient has a contact number available for                            emergencies. The signs and symptoms of potential                            delayed  complications were discussed with the                            patient. Return to normal activities tomorrow.                            Written discharge instructions were provided to the                            patient.                           - Resume previous diet.                           - Continue present medications.                           - Await pathology results.                           - Repeat colonoscopy is recommended. The  colonoscopy date will be determined after pathology                            results from today's exam become available for                            review. Beverley Fiedler, MD 01/26/2024 3:08:26 PM This report has been signed electronically.

## 2024-01-26 NOTE — Progress Notes (Unsigned)
 GASTROENTEROLOGY PROCEDURE H&P NOTE   Primary Care Physician: Elenore Paddy, NP    Reason for Procedure:   IDA  Plan:    Egd and colonoscopy   Patient is appropriate for endoscopic procedure(s) in the ambulatory ([facility]) setting.  The nature of the procedure, as well as the risks, benefits, and alternatives were carefully and thoroughly reviewed with the patient. Ample time for discussion and questions allowed. The patient understood, was satisfied, and agreed to proceed.     HPI: [name] is a 48 y.o. female who presents for egd and colonoscopy.  Medical history as below.  Tolerated the prep.  No recent chest pain or shortness of breath.  No abdominal pain today.  Past Medical History:  Diagnosis Date   Asthma    Hydradenitis    MRSA (methicillin resistant staph aureus) culture positive    Obesity     Past Surgical History:  Procedure Laterality Date   CESAREAN SECTION     x2   TUBAL LIGATION     WISDOM TOOTH EXTRACTION  2011    Prior to Admission medications   Medication Sig Start Date End Date Taking? Authorizing Provider  amitriptyline (ELAVIL) 25 MG tablet Take 25 mg by mouth at bedtime. 05/22/22  Yes [provider]  cefdinir (OMNICEF) 300 MG capsule Take 300 mg by mouth 2 (two) times daily. 12/18/23  Yes [provider]  docusate sodium (COLACE) 100 MG capsule Take 100 mg by mouth 2 (two) times daily.   Yes [provider]  ELIDEL 1 % cream Apply to aa's atopic dermatitis QD-BID PRN flares. 01/20/24  Yes Willeen Niece, MD  ENSKYCE 0.15-30 MG-MCG tablet Take 1 tablet by mouth every day 09/05/23  Yes Conan Bowens, MD  ergocalciferol (VITAMIN D2) 1.25 MG (50000 UT) capsule Take 50,000 Units by mouth once a week.   Yes [provider]  esomeprazole (NEXIUM) 40 MG capsule Take 1 capsule by mouth twice daily 11/07/23  Yes Alfonse Spruce, MD  ferrous sulfate (FEROSUL) 325 (65 FE) MG tablet Take 1 tab by mouth twice  daily, AM & at bedtime 12/05/23  Yes Elenore Paddy, NP  montelukast (SINGULAIR) 10 MG tablet TAKE 1 TABLET BY MOUTH AT  BEDTIME 12/09/23  Yes Alfonse Spruce, MD  oxyCODONE-acetaminophen (PERCOCET/ROXICET) 5-325 MG tablet Take 1-2 tablets by mouth 2 (two) times daily. 01/26/24  Yes Jones Bales, NP  Plecanatide (TRULANCE) 3 MG TABS TAKE 1 TABLET BY MOUTH DAILY 01/16/24  Yes Elenore Paddy, NP  Harrel Carina ELLIPTA 200-62.5-25 MCG/ACT AEPB USE 1 INHALATION BY MOUTH DAILY 04/23/23  Yes Alfonse Spruce, MD  albuterol (PROVENTIL) (2.5 MG/3ML) 0.083% nebulizer solution Inhale one vial via nebulizer every 4 hours as needed for wheezing or shortness of breath 06/20/23   Marcelyn Bruins, MD  albuterol (VENTOLIN HFA) 108 (90 Base) MCG/ACT inhaler Inhale 2 puffs by mouth every 4 hours as needed for wheezing/shortness of breath 12/05/23   Alfonse Spruce, MD  Azelastine HCl 137 MCG/SPRAY SOLN Use 2 sprays into each nostril twice daily Patient not taking: Reported on 01/26/2024 03/20/23   Alfonse Spruce, MD  clindamycin (CLEOCIN T) 1 % external solution Apply to open sores QD after shower. 05/20/23   Willeen Niece, MD  cromolyn (OPTICROM) 4 % ophthalmic solution Place 1 drop into both eyes 4 (four) times daily. 03/20/23   Alfonse Spruce, MD  dorzolamide-timolol (COSOPT) 2-0.5 % ophthalmic solution 1 drop 2 (two) times  daily.    [provider]  EPINEPHrine 0.3 mg/0.3 mL IJ SOAJ injection Inject 0.27ml subcutaneously as needed for anaphylaxis 12/15/23   Nehemiah Settle, FNP  ferrous sulfate 325 (65 FE) MG EC tablet Take 1 tablet (325 mg total) by mouth in the morning and at bedtime. 06/06/23   Elenore Paddy, NP  fluconazole (DIFLUCAN) 150 MG tablet Take 1 tablet (150 mg total) by mouth once a week. 01/06/24   Willeen Niece, MD  fluticasone Aleda Grana) 50 MCG/ACT nasal spray Use 1 spray into each nostril every day 03/28/23   Alfonse Spruce, MD  levocetirizine Elita Boone) 5 MG tablet  Take 1 tablet by mouth every day as needed for allergies 09/22/23   Alfonse Spruce, MD  polyethylene glycol (MIRALAX / GLYCOLAX) 17 g packet Take 17 g by mouth daily. Patient not taking: Reported on 01/26/2024    [provider]  Respiratory Therapy Supplies (NEBULIZER MASK ADULT/TUBING) MISC 1 each by Does not apply route daily. 01/21/24   Alfonse Spruce, MD  Secukinumab (COSENTYX UNOREADY) 300 MG/2ML SOAJ Inject 300 mg into the skin every 28 (twenty-eight) days. 10/09/23   Willeen Niece, MD  Spacer/Aero-Holding Chambers (AEROCHAMBER PLUS) inhaler Use as instructed 08/05/17   Domenick Gong, MD  TEZSPIRE 210 MG/1. syringe 210 mg. 01/23/24   [provider]  triamcinolone ointment (KENALOG) 0.1 % Apply topically to itchy areas at breast qd/bid prn for rash. Avoid skin under breast 09/30/23   Willeen Niece, MD    Current Outpatient Medications  Medication Sig Dispense Refill   amitriptyline (ELAVIL) 25 MG tablet Take 25 mg by mouth at bedtime.     cefdinir (OMNICEF) 300 MG capsule Take 300 mg by mouth 2 (two) times daily.     docusate sodium (COLACE) 100 MG capsule Take 100 mg by mouth 2 (two) times daily.     ELIDEL 1 % cream Apply to aa's atopic dermatitis QD-BID PRN flares. 60 g 3   ENSKYCE 0.15-30 MG-MCG tablet Take 1 tablet by mouth every day 84 tablet 11   ergocalciferol (VITAMIN D2) 1.25 MG (50000 UT) capsule Take 50,000 Units by mouth once a week.     esomeprazole (NEXIUM) 40 MG capsule Take 1 capsule by mouth twice daily 60 capsule 11   ferrous sulfate (FEROSUL) 325 (65 FE) MG tablet Take 1 tab by mouth twice daily, AM & at bedtime 60 tablet 3   montelukast (SINGULAIR) 10 MG tablet TAKE 1 TABLET BY MOUTH AT  BEDTIME 100 tablet 4   oxyCODONE-acetaminophen (PERCOCET/ROXICET) 5-325 MG tablet Take 1-2 tablets by mouth 2 (two) times daily. 120 tablet 0   Plecanatide (TRULANCE) 3 MG TABS TAKE 1 TABLET BY MOUTH DAILY 30 tablet 3   TRELEGY ELLIPTA 200-62.5-25  MCG/ACT AEPB USE 1 INHALATION BY MOUTH DAILY 180 each 3   albuterol (PROVENTIL) (2.5 MG/3ML) 0.083% nebulizer solution Inhale one vial via nebulizer every 4 hours as needed for wheezing or shortness of breath 75 mL 11   albuterol (VENTOLIN HFA) 108 (90 Base) MCG/ACT inhaler Inhale 2 puffs by mouth every 4 hours as needed for wheezing/shortness of breath 18 each 2   Azelastine HCl 137 MCG/SPRAY SOLN Use 2 sprays into each nostril twice daily (Patient not taking: Reported on 01/26/2024) 30 mL 5   clindamycin (CLEOCIN T) 1 % external solution Apply to open sores QD after shower. 60 mL 5   cromolyn (OPTICROM) 4 % ophthalmic solution Place 1 drop into both eyes 4 (four) times daily.  10 mL 5   dorzolamide-timolol (COSOPT) 2-0.5 % ophthalmic solution 1 drop 2 (two) times daily.     EPINEPHrine 0.3 mg/0.3 mL IJ SOAJ injection Inject 0.33ml subcutaneously as needed for anaphylaxis 2 mL 1   ferrous sulfate 325 (65 FE) MG EC tablet Take 1 tablet (325 mg total) by mouth in the morning and at bedtime. 90 tablet 1   fluconazole (DIFLUCAN) 150 MG tablet Take 1 tablet (150 mg total) by mouth once a week. 4 tablet 2   fluticasone (FLONASE) 50 MCG/ACT nasal spray Use 1 spray into each nostril every day 16 g 5   levocetirizine (XYZAL) 5 MG tablet Take 1 tablet by mouth every day as needed for allergies 30 tablet 11   polyethylene glycol (MIRALAX / GLYCOLAX) 17 g packet Take 17 g by mouth daily. (Patient not taking: Reported on 01/26/2024)     Respiratory Therapy Supplies (NEBULIZER MASK ADULT/TUBING) MISC 1 each by Does not apply route daily. 1 each 1   Secukinumab (COSENTYX UNOREADY) 300 MG/2ML SOAJ Inject 300 mg into the skin every 28 (twenty-eight) days. 2 mL 2   Spacer/Aero-Holding Chambers (AEROCHAMBER PLUS) inhaler Use as instructed 1 each 2   TEZSPIRE 210 MG/1. syringe 210 mg.     triamcinolone ointment (KENALOG) 0.1 % Apply topically to itchy areas at breast qd/bid prn for rash. Avoid skin under breast 30 g 1    Current Facility-Administered Medications  Medication Dose Route Frequency Provider Last Rate Last Admin   0.9 %  sodium chloride infusion  500 mL Intravenous Once Malia Corsi, Carie Caddy, MD       tezepelumab-ekko (TEZSPIRE) 210 MG/1. syringe 210 mg  210 mg Subcutaneous Q28 days Alfonse Spruce, MD   210 mg at 01/06/24 1049    Allergies as of 01/26/2024 - Review Complete 01/26/2024  Allergen Reaction Noted   Shellfish-derived products Hives, Itching, and Swelling 04/02/2021   Minocycline Other (See Comments) 04/08/2023    Family History  Problem Relation Age of Onset   Diabetes Mother    Hypertension Mother    Diabetes Father    Asthma Other    Hyperlipidemia Other    Hypertension Other    Colon cancer Neg Hx    Esophageal cancer Neg Hx    Rectal cancer Neg Hx    Stomach cancer Neg Hx     Social History   Socioeconomic History   Marital status: Single    Spouse name: Not on file   Number of children: Not on file   Years of education: Not on file   Highest education level: 11th grade  Occupational History   Not on file  Tobacco Use   Smoking status: Former    Current packs/day: 0.33    Types: Cigarettes   Smokeless tobacco: Never  Vaping Use   Vaping status: Never Used  Substance and Sexual Activity   Alcohol use: Not Currently   Drug use: No   Sexual activity: Yes    Birth control/protection: None  Other Topics Concern   Not on file  Social History Narrative   Not on file   Social Drivers of Health   Financial Resource Strain: Low Risk  (12/14/2023)   Overall Financial Resource Strain (CARDIA)    Difficulty of Paying Living Expenses: Not hard at all  Food Insecurity: No Food Insecurity (12/14/2023)   Hunger Vital Sign    Worried About Running Out of Food in the Last Year: Never true    Ran Out of  Food in the Last Year: Never true  Recent Concern: Food Insecurity - Food Insecurity Present (10/06/2023)   Received from [facility]    Hunger Vital Sign    Worried About Running Out of Food in the Last Year: Sometimes true    Ran Out of Food in the Last Year: Sometimes true  Transportation Needs: No Transportation Needs (12/14/2023)   PRAPARE - Administrator, Civil Service (Medical): No    Lack of Transportation (Non-Medical): No  Physical Activity: Unknown (12/14/2023)   Exercise Vital Sign    Days of Exercise per Week: Patient declined    Minutes of Exercise per Session: 10 min  Recent Concern: Physical Activity - Insufficiently Active (10/31/2023)   Exercise Vital Sign    Days of Exercise per Week: 4 days    Minutes of Exercise per Session: 10 min  Stress: No Stress Concern Present (12/14/2023)   [employer] of Occupational Health - Occupational Stress Questionnaire    Feeling of Stress : Not at all  Social Connections: Moderately Isolated (12/14/2023)   Social Connection and Isolation Panel [NHANES]    Frequency of Communication with Friends and Family: More than three times a week    Frequency of Social Gatherings with Friends and Family: More than three times a week    Attends Religious Services: More than 4 times per year    Active Member of [employer] or Organizations: No    Attends Engineer, structural: Not on file    Marital Status: Separated  Intimate Partner Violence: Not on file    Physical Exam: Vital signs in last 24 hours: @BP  133/70   Pulse 73   Temp 97.7 F (36.5 C) (Temporal)   Ht 5\' 8"  (1.727 m)   Wt (!) 314 lb (142.4 kg)   SpO2 95%   BMI 47.74 kg/m  GEN: NAD EYE: Sclerae anicteric ENT: MMM CV: Non-tachycardic Pulm: CTA b/l GI: Soft, NT/ND NEURO:  Alert & Oriented x 3   Erick Blinks, MD [facility]  01/26/2024 2:29 PM

## 2024-01-26 NOTE — Progress Notes (Signed)
 Called to room to assist during endoscopic procedure.  Patient ID and intended procedure confirmed with present staff. Received instructions for my participation in the procedure from the performing physician.

## 2024-01-26 NOTE — Patient Instructions (Signed)
 -Handout on diverticulosis, Hiatal herniaprovided -await pathology results -repeat colonoscopy for surveillance recommended. Date to be determined when pathology result become available   -Continue present medications   YOU HAD AN ENDOSCOPIC PROCEDURE TODAY AT THE [facility]:   Refer to the procedure report that was given to you for any specific questions about what was found during the examination.  If the procedure report does not answer your questions, please call your gastroenterologist to clarify.  If you requested that your care partner not be given the details of your procedure findings, then the procedure report has been included in a sealed envelope for you to review at your convenience later.  YOU SHOULD EXPECT: Some feelings of bloating in the abdomen. Passage of more gas than usual.  Walking can help get rid of the air that was put into your GI tract during the procedure and reduce the bloating. If you had a lower endoscopy (such as a colonoscopy or flexible sigmoidoscopy) you may notice spotting of blood in your stool or on the toilet paper. If you underwent a bowel prep for your procedure, you may not have a normal bowel movement for a few days.  Please Note:  You might notice some irritation and congestion in your nose or some drainage.  This is from the oxygen used during your procedure.  There is no need for concern and it should clear up in a day or so.  SYMPTOMS TO REPORT IMMEDIATELY:  Following lower endoscopy (colonoscopy or flexible sigmoidoscopy):  Excessive amounts of blood in the stool  Significant tenderness or worsening of abdominal pains  Swelling of the abdomen that is new, acute  Fever of 100F or higher  Following upper endoscopy (EGD)  Vomiting of blood or coffee ground material  New chest pain or pain under the shoulder blades  Painful or persistently difficult swallowing  New shortness of breath  Fever of 100F or higher  Black, tarry-looking  stools  For urgent or emergent issues, a gastroenterologist can be reached at any hour by calling [phone]. Do not use MyChart messaging for urgent concerns.    DIET:  We do recommend a small meal at first, but then you may proceed to your regular diet.  Drink plenty of fluids but you should avoid alcoholic beverages for 24 hours.  ACTIVITY:  You should plan to take it easy for the rest of today and you should NOT DRIVE or use heavy machinery until tomorrow (because of the sedation medicines used during the test).    FOLLOW UP: Our staff will call the number listed on your records the next business day following your procedure.  We will call around 7:15- 8:00 am to check on you and address any questions or concerns that you may have regarding the information given to you following your procedure. If we do not reach you, we will leave a message.     If any biopsies were taken you will be contacted by phone or by letter within the next 1-3 weeks.  Please call us at [phone] if you have not heard about the biopsies in 3 weeks.    SIGNATURES/CONFIDENTIALITY: You and/or your care partner have signed paperwork which will be entered into your electronic medical record.  These signatures attest to the fact that that the information above on your After Visit Summary has been reviewed and is understood.  Full responsibility of the confidentiality of this discharge information lies with you and/or your care-partner.  Diverticulosis  Diverticulosis is when small pouches called diverticula form in the wall of the colon. The colon is where water is absorbed. It is also where poop (stool) is formed. The pouches form when the inside layer of the colon pushes through weak spots in the outer layers of the colon. You may have a few pouches or many of them. In most cases, the pouches do not cause problems. If they become inflamed or infected, you may have a condition called diverticulitis. What are  the causes? The cause of this condition is not known. What increases the risk? You are more likely to get this condition if: You are older than 48 years of age. You do not eat enough fiber or you get constipated a lot. You are overweight. You do not get enough exercise. You smoke. You take over-the-counter pain medicines. You have a family history of the condition. What are the signs or symptoms? In most people, there are no symptoms. If you do have symptoms, they may include: Bloating. Stomach cramps. Constipation or diarrhea. Pain in the lower left side of your abdomen. How is this diagnosed? This condition is often diagnosed during an exam for other colon problems. It may be diagnosed when you have: A colonoscopy. This is when a tube with a camera on the end is used to look at your colon. A barium enema. This is an X-ray exam that uses dye to look at your colon. A CT scan. How is this treated? You may not need treatment. Your health care provider will tell you what you can do at home to help prevent problems. You may need treatment if you have symptoms or if you have had diverticulitis before. You may be told to: Eat a high-fiber diet. Take medicine to relax your colon. Lose weight. Follow these instructions at home: Medicines Take over-the-counter and prescription medicines only as told by your provider. If told, take a fiber supplement or probiotic. Managing constipation Your condition may cause constipation. To prevent or treat constipation, you may need to: Drink enough fluid to keep your pee (urine) pale yellow. Take over-the-counter or prescription medicines. Eat foods that are high in fiber, such as beans, whole grains, and fresh fruits and vegetables. Limit foods that are high in fat and processed sugars, such as fried or sweet foods. Try not to strain when you poop. Contact a health care provider if: Your symptoms get worse all of a sudden. You have pain in your  abdomen that gets worse. You have bloating or stomach cramps. You continue to have frequent constipation. You have a fever or chills. You vomit. Your poop is bloody, black, or tarry. This information is not intended to replace advice given to you by your health care provider. Make sure you discuss any questions you have with your health care provider. Document Revised: 08/08/2022 Document Reviewed: 08/08/2022 Elsevier Patient Education  2024 [employer]Hiatal Hernia  A hiatal hernia occurs when part of the stomach slides above the muscle that separates the abdomen from the chest (diaphragm). A person can be born with a hiatal hernia (congenital), or it may develop over time. In almost all cases of hiatal hernia, only the top part of the stomach pushes through the diaphragm. Many people have a hiatal hernia with no symptoms. The larger the hernia, the more likely it is that you will have symptoms. In some cases, a hiatal hernia allows stomach acid to flow back into the tube that carries food from your mouth to your  stomach (esophagus). This may cause heartburn symptoms. The development of heartburn symptoms may mean that you have a condition called gastroesophageal reflux disease (GERD). What are the causes? This condition is caused by a weakness in the opening (hiatus) where the esophagus passes through the diaphragm to attach to the upper part of the stomach. A person may be born with a weakness in the hiatus, or a weakness can develop over time. What increases the risk? This condition is more likely to develop in: Older people. Age is a major risk factor for a hiatal hernia, especially if you are over the age of 34. Pregnant women. People who are overweight. People who have frequent constipation. What are the signs or symptoms? Symptoms of this condition usually develop in the form of GERD symptoms. Symptoms include: Heartburn. Upset stomach (indigestion). Trouble swallowing. Coughing  or wheezing. Wheezing is making high-pitched whistling sounds when you breathe. Sore throat. Chest pain. Nausea and vomiting. How is this diagnosed? This condition may be diagnosed during testing for GERD. Tests that may be done include: X-rays of your stomach or chest. An upper gastrointestinal (GI) series. This is an X-ray exam of your GI tract that is taken after you swallow a chalky liquid that shows up clearly on the X-ray. Endoscopy. This is a procedure to look into your stomach using a thin, flexible tube that has a tiny camera and light on the end of it. How is this treated? This condition may be treated by: Dietary and lifestyle changes to help reduce GERD symptoms. Medicines. These may include: Over-the-counter antacids. Medicines that make your stomach empty more quickly. Medicines that block the production of stomach acid (H2 blockers). Stronger medicines to reduce stomach acid (proton pump inhibitors). Surgery to repair the hernia, if other treatments are not helping. If you have no symptoms, you may not need treatment. Follow these instructions at home: Lifestyle and activity Do not use any products that contain nicotine or tobacco. These products include cigarettes, chewing tobacco, and vaping devices, such as e-cigarettes. If you need help quitting, ask your health care provider. Try to achieve and maintain a healthy body weight. Avoid putting pressure on your abdomen. Anything that puts pressure on your abdomen increases the amount of acid that may be pushed up into your esophagus. Avoid bending over, especially after eating. Raise the head of your bed by putting blocks under the legs. This keeps your head and esophagus higher than your stomach. Do not wear tight clothing around your chest or stomach. Try not to strain when having a bowel movement, when urinating, or when lifting heavy objects. Eating and drinking Avoid foods that can worsen GERD symptoms. These may  include: Fatty foods, like fried foods. Citrus fruits, like oranges or lemon. Other foods and drinks that contain acid, like orange juice or tomatoes. Spicy food. Chocolate. Eat frequent small meals instead of three large meals a day. This helps prevent your stomach from getting too full. Eat slowly. Do not lie down right after eating. Do not eat 1-2 hours before bed. Do not drink beverages with caffeine. These include cola, coffee, cocoa, and tea. Do not drink alcohol. General instructions Take over-the-counter and prescription medicines only as told by your health care provider. Keep all follow-up visits. Your health care provider will want to check that any new prescribed medicines are helping your symptoms. Contact a health care provider if: Your symptoms are not controlled with medicines or lifestyle changes. You are having trouble swallowing. You have coughing  or wheezing that will not go away. Your pain is getting worse. Your pain spreads to your arms, neck, jaw, teeth, or back. You feel nauseous or you vomit. Get help right away if: You have shortness of breath. You vomit blood. You have bright red blood in your stools. You have black, tarry stools. These symptoms may be an emergency. Get help right away. Call 911. Do not wait to see if the symptoms will go away. Do not drive yourself to the hospital. Summary A hiatal hernia occurs when part of the stomach slides above the muscle that separates the abdomen from the chest. A person may be born with a weakness in the hiatus, or a weakness can develop over time. Symptoms of a hiatal hernia may include heartburn, trouble swallowing, or sore throat. Management of a hiatal hernia includes eating frequent small meals instead of three large meals a day. Get help right away if you vomit blood, have bright red blood in your stools, or have black, tarry stools. This information is not intended to replace advice given to you by your  health care provider. Make sure you discuss any questions you have with your health care provider. Document Revised: 01/08/2022 Document Reviewed: 01/08/2022 Elsevier Patient Education  2024 [employer].

## 2024-01-26 NOTE — Progress Notes (Signed)
 Subjective:    Patient ID: [name], female    DOB: 05/10/1976, 48 y.o.   MRN: [id number]  HPI: [name] is a 48 y.o. female who returns for follow up appointment for chronic pain and medication refill. She states her pain is located in her lower back radiating into her right hip. She rates her pain 7. Her current exercise regime is walking and performing stretching exercises.  [name] Morphine equivalent is 30.00 MME.   Last UDS was Performed on 12/01/2023, it was consistent.    Pain Inventory Average Pain 9 Pain Right Now 7 My pain is sharp, burning, tingling, and aching  In the last 24 hours, has pain interfered with the following? General activity 5 Relation with others 0 Enjoyment of life 0 What TIME of day is your pain at its worst? night Sleep (in general) Fair  Pain is worse with: walking and sitting Pain improves with: heat/ice and medication Relief from Meds: 8  Family History  Problem Relation Age of Onset   Diabetes Mother    Hypertension Mother    Diabetes Father    Asthma Other    Hyperlipidemia Other    Hypertension Other    Social History   Socioeconomic History   Marital status: Single    Spouse name: Not on file   Number of children: Not on file   Years of education: Not on file   Highest education level: 11th grade  Occupational History   Not on file  Tobacco Use   Smoking status: Former    Current packs/day: 0.33    Types: Cigarettes   Smokeless tobacco: Never  Vaping Use   Vaping status: Never Used  Substance and Sexual Activity   Alcohol use: Not Currently    Comment: once a month at a party beer wine or mixed drinks   Drug use: No   Sexual activity: Yes    Birth control/protection: None  Other Topics Concern   Not on file  Social History Narrative   Not on file   Social Drivers of Health   Financial Resource Strain: Low Risk  (12/14/2023)   Overall Financial Resource Strain (CARDIA)    Difficulty of  Paying Living Expenses: Not hard at all  Food Insecurity: No Food Insecurity (12/14/2023)   Hunger Vital Sign    Worried About Running Out of Food in the Last Year: Never true    Ran Out of Food in the Last Year: Never true  Recent Concern: Food Insecurity - Food Insecurity Present (10/06/2023)   Received from [facility]   Hunger Vital Sign    Worried About Running Out of Food in the Last Year: Sometimes true    Ran Out of Food in the Last Year: Sometimes true  Transportation Needs: No Transportation Needs (12/14/2023)   PRAPARE - Administrator, Civil Service (Medical): No    Lack of Transportation (Non-Medical): No  Physical Activity: Unknown (12/14/2023)   Exercise Vital Sign    Days of Exercise per Week: Patient declined    Minutes of Exercise per Session: 10 min  Recent Concern: Physical Activity - Insufficiently Active (10/31/2023)   Exercise Vital Sign    Days of Exercise per Week: 4 days    Minutes of Exercise per Session: 10 min  Stress: No Stress Concern Present (12/14/2023)   [employer] of Occupational Health - Occupational Stress Questionnaire    Feeling of Stress : Not at all  Social Connections: Moderately Isolated (12/14/2023)   Social Connection and Isolation Panel [NHANES]    Frequency of Communication with Friends and Family: More than three times a week    Frequency of Social Gatherings with Friends and Family: More than three times a week    Attends Religious Services: More than 4 times per year    Active Member of [employer] or Organizations: No    Attends Engineer, structural: Not on file    Marital Status: Separated   Past Surgical History:  Procedure Laterality Date   CESAREAN SECTION     x2   TUBAL LIGATION     WISDOM TOOTH EXTRACTION  2011   Past Surgical History:  Procedure Laterality Date   CESAREAN SECTION     x2   TUBAL LIGATION     WISDOM TOOTH EXTRACTION  2011   Past Medical History:  Diagnosis Date    Asthma    Hydradenitis    MRSA (methicillin resistant staph aureus) culture positive    Obesity    BP (!) 146/92   Pulse 66   Ht 5\' 8"  (1.727 m)   Wt (!) 306 lb (138.8 kg)   SpO2 90%   BMI 46.53 kg/m   Opioid Risk Score:   Fall Risk Score:  `1  Depression screen [facility] 2/9     12/15/2023    9:29 AM 12/01/2023    9:35 AM 10/31/2023    9:57 AM 06/09/2023   10:04 AM 04/25/2023    9:49 AM 03/31/2023   10:06 AM 03/21/2023   10:16 AM  Depression screen PHQ 2/9  Decreased Interest 0 0 0 0 0 0 0  Down, Depressed, Hopeless 0 0 0 0 0 0 0  PHQ - 2 Score 0 0 0 0 0 0 0  Altered sleeping     0  0  Tired, decreased energy     0  0  Change in appetite     0  0  Trouble concentrating     0  0  Moving slowly or fidgety/restless     0  0  Suicidal thoughts     0  0  PHQ-9 Score     0  0      Review of Systems  Musculoskeletal:        Right hip pain  All other systems reviewed and are negative.     Objective:   Physical Exam Vitals and nursing note reviewed.  Constitutional:      Appearance: Normal appearance.  Cardiovascular:     Rate and Rhythm: Normal rate and regular rhythm.     Pulses: Normal pulses.     Heart sounds: Normal heart sounds.  Pulmonary:     Effort: Pulmonary effort is normal.     Breath sounds: Normal breath sounds.  Musculoskeletal:     Comments: Normal Muscle Bulk and Muscle Testing Reveals:  Upper Extremities: Full ROM and Muscle Strength 5/5 Lumbar Paraspinal Tenderness: L-4-L-5 Right Greater Trochanter Tenderness Lower Extremities: Full ROM and Muscle Strength 5/5 Right Lower extremity Flexion Produces Pain into her Right Hip Arises from Table slowly using cane for support Antalgic  Gait     Skin:    General: Skin is warm and dry.  Neurological:     Mental Status: She is alert and oriented to person, place, and time.  Psychiatric:        Mood and Affect: Mood normal.        Behavior: Behavior  normal.         Assessment & Plan:  Primary   Osteoarthritis of Right Hip: Continue HEP as Tolerated. Continue current medication regimen/ Continue to Monitor. 01/26/2024 Chronic Pain of Left Knee : Ortho Following. Continue to Monitor. Continue HEP as Tolerated. 01/26/2024 Acute Left Ankle Pain: No complaints today. Ortho following.  Continue to Monitor. 01/26/2024. Morbid Obesity: Continue Healthy Diet Regimen. Continue to Monitor. 01/26/2024. Chronic Pain Syndrome: Refilled: Oxycodone 5-325 mg 1-2 two tablets twice a day as needed for pain #120. Second script sent for the following month. We will continue the opioid monitoring program, this consists of regular clinic visits, examinations, urine drug screen, pill counts as well as use of [place] Controlled Substance Reporting system. A 12 month History has been reviewed on the [place] Controlled Substance Reporting System on 01/26/2024   F/U in 2 months

## 2024-01-26 NOTE — Op Note (Signed)
 [facility] Patient Name: [name] Procedure Date: 01/26/2024 2:22 PM MRN: [id number] Endoscopist: Beverley Fiedler , MD, [id number] Age: 48 Referring MD:  Date of Birth: Dec 02, 1975 Gender: Female Account #: [id number] Procedure:                Upper GI endoscopy Indications:              Iron deficiency anemia Medicines:                Monitored Anesthesia Care Procedure:                Pre-Anesthesia Assessment:                           - Prior to the procedure, a History and Physical                            was performed, and patient medications and                            allergies were reviewed. The patient's tolerance of                            previous anesthesia was also reviewed. The risks                            and benefits of the procedure and the sedation                            options and risks were discussed with the patient.                            All questions were answered, and informed consent                            was obtained. Prior Anticoagulants: The patient has                            taken no anticoagulant or antiplatelet agents. ASA                            Grade Assessment: III - A patient with severe                            systemic disease. After reviewing the risks and                            benefits, the patient was deemed in satisfactory                            condition to undergo the procedure.                           After obtaining informed consent, the endoscope was  passed under direct vision. Throughout the                            procedure, the patient's blood pressure, pulse, and                            oxygen saturations were monitored continuously. The                            GIF [id number] #[id number] was introduced through the                            mouth, and advanced to the second part of duodenum.                            The upper GI endoscopy was  accomplished without                            difficulty. The patient tolerated the procedure                            well. Scope In: Scope Out: Findings:                 The examined esophagus was normal.                           A 4 cm hiatal hernia was present.                           The gastroesophageal flap valve was visualized                            endoscopically and classified as Hill Grade IV (no                            fold, wide open lumen, hiatal hernia present).                           Mildly erythematous mucosa without bleeding was                            found in the gastric antrum. Biopsies were taken                            with a cold forceps for Helicobacter pylori testing.                           The examined duodenum was normal. Biopsies for                            histology were taken with a cold forceps for                            evaluation of celiac disease.  Complications:            No immediate complications. Estimated Blood Loss:     Estimated blood loss: none. Impression:               - Normal esophagus.                           - 4 cm hiatal hernia.                           - Erythematous mucosa in the antrum. Biopsied.                           - Normal examined duodenum. Biopsied. Recommendation:           - Patient has a contact number available for                            emergencies. The signs and symptoms of potential                            delayed complications were discussed with the                            patient. Return to normal activities tomorrow.                            Written discharge instructions were provided to the                            patient.                           - Resume previous diet.                           - Continue present medications.                           - Await pathology results.                           - See the other procedure note for documentation of                             additional recommendations. Beverley Fiedler, MD 01/26/2024 3:04:30 PM This report has been signed electronically.

## 2024-01-27 ENCOUNTER — Other Ambulatory Visit: Payer: Self-pay | Admitting: Allergy & Immunology

## 2024-01-27 ENCOUNTER — Telehealth: Payer: Self-pay

## 2024-01-27 NOTE — Telephone Encounter (Signed)
 Left message on follow up call.

## 2024-01-29 ENCOUNTER — Other Ambulatory Visit: Payer: Self-pay

## 2024-01-29 DIAGNOSIS — L732 Hidradenitis suppurativa: Secondary | ICD-10-CM

## 2024-01-29 MED ORDER — COSENTYX UNOREADY 300 MG/2ML ~~LOC~~ SOAJ: 300.0000 mg | SUBCUTANEOUS | 1 refills | Status: DC

## 2024-01-29 NOTE — Progress Notes (Signed)
 Spoke with patient today regarding Cosentyx refills. She has not started infusions from [facility] yet. 2 months worth of Rfs sent in. Patient reminded to d/c once infusions start. aw

## 2024-01-30 LAB — SURGICAL PATHOLOGY

## 2024-02-03 ENCOUNTER — Ambulatory Visit: Payer: 59 | Admitting: *Deleted

## 2024-02-03 DIAGNOSIS — J4550 Severe persistent asthma, uncomplicated: Secondary | ICD-10-CM | POA: Diagnosis not present

## 2024-02-09 ENCOUNTER — Encounter: Payer: Self-pay | Admitting: Internal Medicine

## 2024-02-09 ENCOUNTER — Encounter: Attending: Surgery | Admitting: Skilled Nursing Facility1

## 2024-02-09 ENCOUNTER — Telehealth: Payer: Self-pay

## 2024-02-09 ENCOUNTER — Other Ambulatory Visit: Payer: Self-pay | Admitting: *Deleted

## 2024-02-09 VITALS — Wt 306.0 lb

## 2024-02-09 DIAGNOSIS — Z6841 Body Mass Index (BMI) 40.0 and over, adult: Secondary | ICD-10-CM | POA: Diagnosis not present

## 2024-02-09 DIAGNOSIS — Z713 Dietary counseling and surveillance: Secondary | ICD-10-CM | POA: Diagnosis not present

## 2024-02-09 DIAGNOSIS — E66813 Obesity, class 3: Secondary | ICD-10-CM | POA: Insufficient documentation

## 2024-02-09 DIAGNOSIS — K50119 Crohn's disease of large intestine with unspecified complications: Secondary | ICD-10-CM

## 2024-02-09 DIAGNOSIS — D509 Iron deficiency anemia, unspecified: Secondary | ICD-10-CM

## 2024-02-09 MED ORDER — MESALAMINE 1.2 G PO TBEC: 2.4000 g | DELAYED_RELEASE_TABLET | Freq: Every day | ORAL | 2 refills | Status: DC

## 2024-02-09 NOTE — Telephone Encounter (Signed)
 This has been ran and documented in [place] and scanned to media. New telephone encounter started and sent to [facility]

## 2024-02-09 NOTE — Telephone Encounter (Signed)
 Per Luanna Cole, okay to repeat after 03/03/24.

## 2024-02-09 NOTE — Telephone Encounter (Signed)
 Patient has been getting Zilretta injection fairly routinely. Patient does not need a PA through [place]. Sending as an FYI, patient normally will call and schedule when she needs it.    Zilretta authorized for left knee No PA required Patients responsibility for J3304 [name]), CPT code 08657, and OV will be 20% The remaining covered at 80% by the payer at the contracted rate Patient has a deductible of $257. Deductible has been met Patient has an OOP max of $93500 and has accumulates $381.62 Once OOP is met coverage goes to 100% Patent has medicaid as secondary they do not cover Gel.  Document scanned

## 2024-02-09 NOTE — Progress Notes (Signed)
 Pre-Operative Nutrition Class:    Patient was seen on 02/09/2024 for Pre-Operative Bariatric Surgery Education at [facility].    Anthropometrics  Start weight at [facility]: 324.7 lbs (date: 06/09/2023)  Height: 68 in Weight today: 306  Clinical   Pharmacotherapy: History of weight loss medication used: zepbound has not been approved Medical hx: reflux, asthma Medications: see list   Labs: LDL 126; iron 32; RDW 18.9 Notable signs/symptoms: none noted Any previous deficiencies? No  Samples given per MNT protocol. Patient educated on appropriate usage: Ensure max: lot [id number]: exp: 09/25/2024 Celebrate 45: lot: [id number]: exp: 06/2024  The following the learning objectives were met by the patient during this course: Identify Pre-Op Dietary Goals and will begin 2 weeks pre-operatively Identify appropriate sources of fluids and proteins  State protein recommendations and appropriate sources pre and post-operatively Identify Post-Operative Dietary Goals and will follow for 2 weeks post-operatively Identify appropriate multivitamin and calcium sources Describe the need for physical activity post-operatively and will follow MD recommendations State when to call healthcare provider regarding medication questions or post-operative complications When having a diagnosis of diabetes understanding hypoglycemia symptoms and the inclusion of 1 complex carbohydrate per meal  Handouts given during class include: Pre-Op Bariatric Surgery Diet Handout Protein Shake Handout Post-Op Bariatric Surgery Nutrition Handout BELT Program Information Flyer Support Group Information Flyer [facility] Bariatric Supplements Price List  Follow-Up Plan: Patient will follow-up at [facility] 2 weeks post operatively for diet advancement per MD.

## 2024-02-10 NOTE — Telephone Encounter (Signed)
 Scheduled

## 2024-02-11 ENCOUNTER — Encounter: Payer: Self-pay | Admitting: Nurse Practitioner

## 2024-02-16 ENCOUNTER — Ambulatory Visit: Payer: 59

## 2024-02-18 ENCOUNTER — Other Ambulatory Visit: Payer: Self-pay | Admitting: Allergy & Immunology

## 2024-02-18 ENCOUNTER — Other Ambulatory Visit: Payer: Self-pay | Admitting: Nurse Practitioner

## 2024-02-20 ENCOUNTER — Ambulatory Visit

## 2024-02-20 ENCOUNTER — Ambulatory Visit: Payer: 59

## 2024-02-20 VITALS — BP 140/80 | HR 68 | Ht 68.0 in | Wt 305.8 lb

## 2024-02-20 DIAGNOSIS — Z1231 Encounter for screening mammogram for malignant neoplasm of breast: Secondary | ICD-10-CM | POA: Diagnosis not present

## 2024-02-20 DIAGNOSIS — E538 Deficiency of other specified B group vitamins: Secondary | ICD-10-CM | POA: Diagnosis not present

## 2024-02-20 DIAGNOSIS — Z0000 Encounter for general adult medical examination without abnormal findings: Secondary | ICD-10-CM | POA: Diagnosis not present

## 2024-02-20 MED ORDER — CYANOCOBALAMIN 1000 MCG/ML IJ SOLN
1000.0000 ug | Freq: Once | INTRAMUSCULAR | Status: AC
  Administered 2024-02-20: 1000 ug via INTRAMUSCULAR

## 2024-02-20 NOTE — Progress Notes (Signed)
 Subjective:   [name] is a 48 y.o. who presents for a Medicare Wellness preventive visit.  Visit Complete: In person   Persons Participating in Visit: Patient.  AWV Questionnaire: No: Patient Medicare AWV questionnaire was not completed prior to this visit.  Cardiac Risk Factors include: advanced age (>69men, >31 women);Other (see comment);obesity (BMI >30kg/m2), Risk factor comments: Graves disease     Objective:    Today's Vitals   02/20/24 0902  BP: (!) 140/80  Pulse: 68  SpO2: 97%  Weight: (!) 305 lb 12.8 oz (138.7 kg)  Height: 5\' 8"  (1.727 m)  PainSc: 6    Body mass index is 46.5 kg/m.     02/20/2024    9:09 AM 06/09/2023   10:04 AM 04/30/2023    8:51 AM 03/12/2023    9:50 AM 02/06/2023   10:28 AM 11/17/2020    4:42 AM 01/08/2019    3:46 PM  Advanced Directives  Does Patient Have a Medical Advance Directive? No No No No No No No  Would patient like information on creating a medical advance directive?  No - Patient declined  No - Patient declined No - Patient declined  No - Patient declined    Current Medications (verified) Outpatient Encounter Medications as of 02/20/2024  Medication Sig   albuterol (PROVENTIL) (2.5 MG/3ML) 0.083% nebulizer solution Inhale one vial via nebulizer every 4 hours as needed for wheezing or shortness of breath   albuterol (VENTOLIN HFA) 108 (90 Base) MCG/ACT inhaler Inhale 2 puffs by mouth every 4 hours as needed for wheezing/shortness of breath   amitriptyline (ELAVIL) 25 MG tablet Take 25 mg by mouth at bedtime.   Azelastine HCl 137 MCG/SPRAY SOLN Use 2 sprays into each nostril twice daily   cefdinir (OMNICEF) 300 MG capsule Take 300 mg by mouth 2 (two) times daily.   clindamycin (CLEOCIN T) 1 % external solution Apply to open sores QD after shower.   cromolyn (OPTICROM) 4 % ophthalmic solution Place 1 drop into both eyes 4 (four) times daily.   docusate sodium (COLACE) 100 MG capsule Take 100 mg by mouth 2 (two) times daily.    dorzolamide-timolol (COSOPT) 2-0.5 % ophthalmic solution 1 drop 2 (two) times daily.   ELIDEL 1 % cream Apply to aa's atopic dermatitis QD-BID PRN flares.   ENSKYCE 0.15-30 MG-MCG tablet Take 1 tablet by mouth every day   EPINEPHrine 0.3 mg/0.3 mL IJ SOAJ injection Inject 0.27ml subcutaneously as needed for anaphylaxis   esomeprazole (NEXIUM) 40 MG capsule Take 1 capsule by mouth twice daily   ferrous sulfate (FEROSUL) 325 (65 FE) MG tablet Take 1 tab by mouth twice daily, AM & at bedtime   ferrous sulfate 325 (65 FE) MG EC tablet Take 1 tablet (325 mg total) by mouth in the morning and at bedtime.   fluconazole (DIFLUCAN) 150 MG tablet Take 1 tablet (150 mg total) by mouth once a week.   fluticasone (FLONASE) 50 MCG/ACT nasal spray Use 1 spray into each nostril every day   levocetirizine (XYZAL) 5 MG tablet Take 1 tablet by mouth every day as needed for allergies   mesalamine (LIALDA) 1.2 g EC tablet Take 2 tablets (2.4 g total) by mouth daily with breakfast.   montelukast (SINGULAIR) 10 MG tablet TAKE 1 TABLET BY MOUTH AT  BEDTIME   oxyCODONE-acetaminophen (PERCOCET/ROXICET) 5-325 MG tablet Take 1-2 tablets by mouth 2 (two) times daily.   Plecanatide (TRULANCE) 3 MG TABS TAKE 1 TABLET BY MOUTH DAILY  polyethylene glycol (MIRALAX / GLYCOLAX) 17 g packet Take 17 g by mouth daily.   Respiratory Therapy Supplies (NEBULIZER MASK ADULT/TUBING) MISC 1 each by Does not apply route daily.   Secukinumab (COSENTYX UNOREADY) 300 MG/2ML SOAJ Inject 300 mg into the skin every 28 (twenty-eight) days.   Spacer/Aero-Holding Chambers (AEROCHAMBER PLUS) inhaler Use as instructed   TEZSPIRE 210 MG/1. syringe 210 mg.   TRELEGY ELLIPTA 200-62.5-25 MCG/ACT AEPB USE 1 INHALATION BY MOUTH DAILY   triamcinolone ointment (KENALOG) 0.1 % Apply topically to itchy areas at breast qd/bid prn for rash. Avoid skin under breast   Vitamin D, Ergocalciferol, (DRISDOL) 1.25 MG (50000 UNIT) CAPS capsule Take 1 capsule by  mouth once a week   Facility-Administered Encounter Medications as of 02/20/2024  Medication   tezepelumab-ekko (TEZSPIRE) 210 MG/1. syringe 210 mg    Allergies (verified) Shellfish-derived products and Minocycline   History: Past Medical History:  Diagnosis Date   Asthma    Diverticulitis of colon    Hydradenitis    MRSA (methicillin resistant staph aureus) culture positive    Obesity    Past Surgical History:  Procedure Laterality Date   CESAREAN SECTION     x2   TUBAL LIGATION     WISDOM TOOTH EXTRACTION  2011   Family History  Problem Relation Age of Onset   Diabetes Mother    Hypertension Mother    Diabetes Father    Asthma Other    Hyperlipidemia Other    Hypertension Other    Colon cancer Neg Hx    Esophageal cancer Neg Hx    Rectal cancer Neg Hx    Stomach cancer Neg Hx    Social History   Socioeconomic History   Marital status: Single    Spouse name: Not on file   Number of children: Not on file   Years of education: Not on file   Highest education level: 11th grade  Occupational History   Occupation: disabled  Tobacco Use   Smoking status: Former    Current packs/day: 0.33    Types: Cigarettes   Smokeless tobacco: Never  Vaping Use   Vaping status: Never Used  Substance and Sexual Activity   Alcohol use: Not Currently   Drug use: No   Sexual activity: Yes    Birth control/protection: None  Other Topics Concern   Not on file  Social History Narrative   Lives with her daughter   Social Drivers of Health   Financial Resource Strain: Low Risk  (02/20/2024)   Overall Financial Resource Strain (CARDIA)    Difficulty of Paying Living Expenses: Not hard at all  Food Insecurity: No Food Insecurity (02/20/2024)   Hunger Vital Sign    Worried About Running Out of Food in the Last Year: Never true    Ran Out of Food in the Last Year: Never true  Transportation Needs: No Transportation Needs (02/20/2024)   PRAPARE - Scientist, research (physical sciences) (Medical): No    Lack of Transportation (Non-Medical): No  Physical Activity: Inactive (02/20/2024)   Exercise Vital Sign    Days of Exercise per Week: 0 days    Minutes of Exercise per Session: 0 min  Stress: Stress Concern Present (02/20/2024)   [name] [employer] Occupational [employer] - Occupational Stress Questionnaire    Feeling of Stress : To some extent  Social Connections: Moderately Isolated (02/20/2024)   Social Connection and Isolation Panel [NHANES]    Frequency of Communication with Friends and  Family: More than three times a week    Frequency of Social Gatherings with Friends and Family: More than three times a week    Attends Religious Services: More than 4 times per year    Active Member of [employer] or Organizations: No    Attends Engineer, structural: Never    Marital Status: Divorced    Tobacco Counseling Counseling given: Not Answered    Clinical Intake:  Pre-visit preparation completed: Yes  Pain : 0-10 Pain Score: 6  Pain Type: Chronic pain Pain Location: Hip Pain Orientation: Right Pain Onset: More than a month ago Pain Frequency: Constant Pain Relieving Factors: Oxycodone acetaminophen  Pain Relieving Factors: Oxycodone acetaminophen  BMI - recorded: 46.5 Nutritional Status: BMI > 30  Obese Nutritional Risks: None Diabetes: No  Lab Results  Component Value Date   HGBA1C 4.9 01/21/2023     How often do you need to have someone help you when you read instructions, pamphlets, or other written materials from your doctor or pharmacy?: 1 - Never  Interpreter Needed?: No  Information entered by :: Jaasiel Hollyfield, RMA   Activities of Daily Living     02/20/2024    9:04 AM  In your present state of health, do you have any difficulty performing the following activities:  Hearing? 0  Vision? 0  Difficulty concentrating or making decisions? 0  Walking or climbing stairs? 0  Dressing or bathing? 0  Doing errands, shopping? 0   Preparing Food and eating ? N  Using the Toilet? N  In the past six months, have you accidently leaked urine? N  Do you have problems with loss of bowel control? N  Managing your Medications? N  Managing your Finances? N  Housekeeping or managing your Housekeeping? N    Patient Care Team: Elenore Paddy, NP as PCP - General (Nurse Practitioner)  Indicate any recent Medical Services you may have received from other than [facility] providers in the past year (date may be approximate).     Assessment:   This is a routine wellness examination for [name].  Hearing/Vision screen Hearing Screening - Comments:: Denies hearing difficulties   Vision Screening - Comments:: Wears eyeglasses   Goals Addressed               This Visit's Progress     Patient Stated (pt-stated)        Continue to lose the weight for my hip surgery       Depression Screen     02/20/2024    9:38 AM 12/15/2023    9:29 AM 12/01/2023    9:35 AM 10/31/2023    9:57 AM 06/09/2023   10:04 AM 04/25/2023    9:49 AM 03/31/2023   10:06 AM  PHQ 2/9 Scores  PHQ - 2 Score 0 0 0 0 0 0 0  PHQ- 9 Score 0     0     Fall Risk     02/20/2024    9:09 AM 01/26/2024    9:19 AM 12/15/2023    9:29 AM 12/01/2023    9:35 AM 10/31/2023    9:56 AM  Fall Risk   Falls in the past year? 0 0 0 0 0  Number falls in past yr: 0  0 0 0  Injury with Fall? 0  0 0 0  Risk for fall due to : No Fall Risks  No Fall Risks  No Fall Risks  Follow up Falls prevention discussed;Falls evaluation completed  Falls evaluation completed  Falls evaluation completed    MEDICARE RISK AT HOME:  Medicare Risk at Home Any stairs in or around the home?: No If so, are there any without handrails?: No Home free of loose throw rugs in walkways, pet beds, electrical cords, etc?: Yes Adequate lighting in your home to reduce risk of falls?: Yes Life alert?: No Use of a cane, walker or w/c?: No Grab bars in the bathroom?: No Shower chair or bench in shower?:  No Elevated toilet seat or a handicapped toilet?: No  TIMED UP AND GO:  Was the test performed?  Yes  Length of time to ambulate 10 feet: 15 sec Gait steady and fast without use of assistive device  Cognitive Function: 6CIT completed        Immunizations  There is no immunization history on file for this patient.  Screening Tests Health Maintenance  Topic Date Due   Medicare Annual Wellness (AWV)  Never done   COVID-19 Vaccine (1) Never done   Pneumococcal Vaccine 45-3 Years old (1 of 2 - PCV) Never done   DTaP/Tdap/Td (1 - Tdap) Never done   INFLUENZA VACCINE  Never done   Cervical Cancer Screening (HPV/Pap Cotest)  01/04/2028   Colonoscopy  01/25/2034   Hepatitis C Screening  Completed   HIV Screening  Completed   HPV VACCINES  Aged Out    Health Maintenance  Health Maintenance Due  Topic Date Due   Medicare Annual Wellness (AWV)  Never done   COVID-19 Vaccine (1) Never done   Pneumococcal Vaccine 69-80 Years old (1 of 2 - PCV) Never done   DTaP/Tdap/Td (1 - Tdap) Never done   INFLUENZA VACCINE  Never done   Health Maintenance Items Addressed: Mammogram ordered, See Nurse Notes  Additional Screening:  Vision Screening: Recommended annual ophthalmology exams for early detection of glaucoma and other disorders of the eye.  Dental Screening: Recommended annual dental exams for proper oral hygiene  Community Resource Referral / Chronic Care Management: CRR required this visit?  No   CCM required this visit?  No     Plan:     I have personally reviewed and noted the following in the patient's chart:   Medical and social history Use of alcohol, tobacco or illicit drugs  Current medications and supplements including opioid prescriptions. Patient is currently taking opioid prescriptions. Information provided to patient regarding non-opioid alternatives. Patient advised to discuss non-opioid treatment plan with their provider. Functional ability and  status Nutritional status Physical activity Advanced directives List of other physicians Hospitalizations, surgeries, and ER visits in previous 12 months Vitals Screenings to include cognitive, depression, and falls Referrals and appointments  In addition, I have reviewed and discussed with patient certain preventive protocols, quality metrics, and best practice recommendations. A written personalized care plan for preventive services as well as general preventive health recommendations were provided to patient.     [name], CMA   02/20/2024   After Visit Summary: (MyChart) Due to this being a telephonic visit, the after visit summary with patients personalized plan was offered to patient via MyChart   Notes: Nothing significant to report at this time.

## 2024-02-20 NOTE — Patient Instructions (Addendum)
 Ms. Roesler , Thank you for taking time to come for your Medicare Wellness Visit. I appreciate your ongoing commitment to your health goals. Please review the following plan we discussed and let me know if I can assist you in the future.   Referrals/Orders/Follow-Ups/Clinician Recommendations: It was nice to meet you today.  You are due for a Tetanus vaccine. Aim for 30 minutes of exercise or brisk walking, 6-8 glasses of water, and 5 servings of fruits and vegetables each day. You have an order for:   [x]   3D Mammogram    Please call for appointment:  [facility] [address] [place], [place] [address] [phone]  Make sure to wear two-piece clothing.  No lotions, powders, or deodorants the day of the appointment. Make sure to bring picture ID and insurance card.  Bring list of medications you are currently taking including any supplements.    This is a list of the screening recommended for you and due dates:  Health Maintenance  Topic Date Due   Medicare Annual Wellness Visit  Never done   COVID-19 Vaccine (1) Never done   Pneumococcal Vaccination (1 of 2 - PCV) Never done   DTaP/Tdap/Td vaccine (1 - Tdap) Never done   Flu Shot  Never done   Pap with HPV screening  01/04/2028   Colon Cancer Screening  01/25/2034   Hepatitis C Screening  Completed   HIV Screening  Completed   HPV Vaccine  Aged Out    Advanced directives: (Declined) Advance directive discussed with you today. Even though you declined this today, please call our office should you change your mind, and we can give you the proper paperwork for you to fill out.  Next Medicare Annual Wellness Visit scheduled for next year: Yes  Managing Pain Without Opioids Opioids are strong medicines used to treat moderate to severe pain. For some people, especially those who have long-term (chronic) pain, opioids may not be the best choice for pain management due to: Side effects like nausea,  constipation, and sleepiness. The risk of addiction (opioid use disorder). The longer you take opioids, the greater your risk of addiction. Pain that lasts for more than 3 months is called chronic pain. Managing chronic pain usually requires more than one approach and is often provided by a team of health care providers working together (multidisciplinary approach). Pain management may be done at a pain management center or pain clinic. How to manage pain without the use of opioids Use non-opioid medicines Non-opioid medicines for pain may include: Over-the-counter or prescription non-steroidal anti-inflammatory drugs (NSAIDs). These may be the first medicines used for pain. They work well for muscle and bone pain, and they reduce swelling. Acetaminophen. This over-the-counter medicine may work well for milder pain but not swelling. Antidepressants. These may be used to treat chronic pain. A certain type of antidepressant (tricyclics) is often used. These medicines are given in lower doses for pain than when used for depression. Anticonvulsants. These are usually used to treat seizures but may also reduce nerve (neuropathic) pain. Muscle relaxants. These relieve pain caused by sudden muscle tightening (spasms). You may also use a pain medicine that is applied to the skin as a patch, cream, or gel (topical analgesic), such as a numbing medicine. These may cause fewer side effects than medicines taken by mouth. Do certain therapies as directed Some therapies can help with pain management. They include: Physical therapy. You will do exercises to gain strength and flexibility. A  physical therapist may teach you exercises to move and stretch parts of your body that are weak, stiff, or painful. You can learn these exercises at physical therapy visits and practice them at home. Physical therapy may also involve: Massage. Heat wraps or applying heat or cold to affected areas. Electrical signals that  interrupt pain signals (transcutaneous electrical nerve stimulation, TENS). Weak lasers that reduce pain and swelling (low-level laser therapy). Signals from your body that help you learn to regulate pain (biofeedback). Occupational therapy. This helps you to learn ways to function at home and work with less pain. Recreational therapy. This involves trying new activities or hobbies, such as a physical activity or drawing. Mental health therapy, including: Cognitive behavioral therapy (CBT). This helps you learn coping skills for dealing with pain. Acceptance and commitment therapy (ACT) to change the way you think and react to pain. Relaxation therapies, including muscle relaxation exercises and mindfulness-based stress reduction. Pain management counseling. This may be individual, family, or group counseling.  Receive medical treatments Medical treatments for pain management include: Nerve block injections. These may include a pain blocker and anti-inflammatory medicines. You may have injections: Near the spine to relieve chronic back or neck pain. Into joints to relieve back or joint pain. Into nerve areas that supply a painful area to relieve body pain. Into muscles (trigger point injections) to relieve some painful muscle conditions. A medical device placed near your spine to help block pain signals and relieve nerve pain or chronic back pain (spinal cord stimulation device). Acupuncture. Follow these instructions at home Medicines Take over-the-counter and prescription medicines only as told by your health care provider. If you are taking pain medicine, ask your health care providers about possible side effects to watch out for. Do not drive or use heavy machinery while taking prescription opioid pain medicine. Lifestyle  Do not use drugs or alcohol to reduce pain. If you drink alcohol, limit how much you have to: 0-1 drink a day for women who are not pregnant. 0-2 drinks a day for  men. Know how much alcohol is in a drink. In the [place] one drink equals one 12 oz bottle of beer (355 mL), one 5 oz glass of wine (148 mL), or one 1 oz glass of hard liquor (44 mL). Do not use any products that contain nicotine or tobacco. These products include cigarettes, chewing tobacco, and vaping devices, such as e-cigarettes. If you need help quitting, ask your health care provider. Eat a healthy diet and maintain a healthy weight. Poor diet and excess weight may make pain worse. Eat foods that are high in fiber. These include fresh fruits and vegetables, whole grains, and beans. Limit foods that are high in fat and processed sugars, such as fried and sweet foods. Exercise regularly. Exercise lowers stress and may help relieve pain. Ask your health care provider what activities and exercises are safe for you. If your health care provider approves, join an exercise class that combines movement and stress reduction. Examples include yoga and tai chi. Get enough sleep. Lack of sleep may make pain worse. Lower stress as much as possible. Practice stress reduction techniques as told by your therapist. General instructions Work with all your pain management providers to find the treatments that work best for you. You are an important member of your pain management team. There are many things you can do to reduce pain on your own. Consider joining an online or in-person support group for people who have chronic pain.  Keep all follow-up visits. This is important. Where to find more information You can find more information about managing pain without opioids from: American Academy of Pain Medicine: painmed.org Institute for Chronic Pain: instituteforchronicpain.org American Chronic Pain Association: theacpa.org Contact a health care provider if: You have side effects from pain medicine. Your pain gets worse or does not get better with treatments or home therapy. You are struggling with anxiety  or depression. Summary Many types of pain can be managed without opioids. Chronic pain may respond better to pain management without opioids. Pain is best managed when you and a team of health care providers work together. Pain management without opioids may include non-opioid medicines, medical treatments, physical therapy, mental health therapy, and lifestyle changes. Tell your health care providers if your pain gets worse or is not being managed well enough. This information is not intended to replace advice given to you by your health care provider. Make sure you discuss any questions you have with your health care provider. Document Revised: 02/21/2021 Document Reviewed: 02/21/2021 Elsevier Patient Education  2024 [employer].

## 2024-02-22 ENCOUNTER — Other Ambulatory Visit: Payer: Self-pay | Admitting: Allergy & Immunology

## 2024-02-24 ENCOUNTER — Telehealth: Payer: Self-pay | Admitting: Dietician

## 2024-02-24 NOTE — Telephone Encounter (Signed)
 Pt called with questions about the pre-op diet starting today, 2 weeks prior to her scheduled surgery. Dietitian answered questions to the patient's satisfaction, and pt expressed understanding. Dietitian invited pt to call if she has any other questions.

## 2024-02-26 ENCOUNTER — Ambulatory Visit: Payer: Self-pay | Admitting: Surgery

## 2024-02-26 ENCOUNTER — Telehealth: Admitting: Nurse Practitioner

## 2024-02-26 DIAGNOSIS — L732 Hidradenitis suppurativa: Secondary | ICD-10-CM | POA: Diagnosis not present

## 2024-02-26 DIAGNOSIS — M1990 Unspecified osteoarthritis, unspecified site: Secondary | ICD-10-CM | POA: Diagnosis not present

## 2024-02-26 DIAGNOSIS — E611 Iron deficiency: Secondary | ICD-10-CM | POA: Diagnosis not present

## 2024-02-26 DIAGNOSIS — G894 Chronic pain syndrome: Secondary | ICD-10-CM | POA: Diagnosis not present

## 2024-02-26 DIAGNOSIS — J45909 Unspecified asthma, uncomplicated: Secondary | ICD-10-CM | POA: Diagnosis not present

## 2024-02-26 NOTE — Progress Notes (Signed)
 Surgery orders requested via Epic inbox.

## 2024-02-26 NOTE — H&P (View-Only) (Signed)
 [name] [id number]   Referring Provider:  Self   Subjective   Chief Complaint: Pre-op Exam (LSG, UGE, HHR/)     History of Present Illness:    Returns for follow-up regarding surgical treatment of morbid obesity.  We had initially discussed Roux-en-Y gastric bypass open history of reflux.  However after further consideration patient wishes to pursue sleeve gastrectomy.  This may actually be prudent as she is now on a couple more immunomodulators.  She reports her reflux is actually much improved since our last visit as she has made some dietary modifications (eliminating red foods).  She has maintained tobacco cessation.  She reports otherwise she has been in good health and is ready to proceed.  She has started the preop diet.  Upper GI 06/11/2023: Small sliding hiatal hernia, no signs of reflux, otherwise normal Chest x-ray 06/11/2023: Negative, stable eventration of right hemidiaphragm Bariatric labs 06/11/2023 notable for mildly elevated LDL, low iron/iron saturation at 26 and 7 respectively which is now improved on more recent labs, globin 12.4, globin A1c 5.7, otherwise labs unremarkable Ultrasound 06/17/2023 for abdominal pain: Negative for gallstones or gallbladder/biliary abnormality other than hepatic steatosis She has completed supervised weight loss with the dietitians and is cleared from that standpoint.  She has completed the preop class.  echocardiogram 07/01/2023 with normal EF and no significant abnormalities  Labs done in January of this year: CBC/LFTs unremarkable; Iron panel with low saturation but otherwise normal; Vitamin B12 low at 190; PCP notes as of July last year she is on iron supplementation and has been on OCP for about the last 6 months for vaginal bleeding now-has held this for surgery.  She did start a new medication for her asthma and has had to take prednisone tapers for exacerbations over the last year.    EGD/colonoscopy 01/26/2024 Dr. Rhea Combs -4 cm  hiatal hernia, Hill grade 4 GE flap valve, gastritis, esophagus normal, duodenum normal -Multiple diverticuli in the sigmoid, descending and ascending colon, segmental moderate mucosal changes characterized by congestion, erythema and loss of vascularity in the sigmoid colon -Pathology benign duodenal mucosa with no diagnostic abnormality; reactive gastropathy and chronic gastritis, negative for H. pylori or metaplasia/dysplasia, chronic focally active colitis negative for dysplasia or granulomas.  Lialda was sent to pharmacy for the segmental colitis and plans for repeat CBC, ferritin and iron studies in June  05/09/23: 48 year old woman presents for consultation regarding surgical treatment of morbid obesity.  She has struggled with this for her entire life, and states that in the early 2000s she was up to about 500 pounds.  She has continued working on this throughout her adulthood and was able to get to a nadir of 289 pounds and was fairly satisfied with this, until she started developing significant arthritis issues.  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  She helps take care of of her grandchildren, 2 of whom she has with her today, and wants to be able to keep up with them as well as to be around for them for a long time. Denies NSAID use currently but has been prescribed Percocet for her hip pain. She has several acquaintances and relatives who have had bariatric surgery and so she has garnered a lot of information from them and in fact has put herself on the high-protein diet, has cut out sweetened  beverages and switch to water, and has actually been losing weight on her own which she does plan to continue doing.  Her primary care doctor is also helping her with this; Insurance denied Reginal Lutes but she is looking into other medical weight loss therapies as well. She struggles with  reflux significantly, she takes a daily high-dose PPI but reports breakthrough symptoms including nighttime regurgitation.  ER visit 1 week ago for persistent vaginal bleeding.  Has follow-up with her gynecologist on June 19.  Previous abdominal surgery includes C-sectionx2 and tubal ligation  Family history notable for diabetes in both parents, hypertension in her mother   Reports that she quit smoking April 1 and while it has been difficult, she has maintained smoking cessation which she did after she learned that she would have to stop smoking in order to have bariatric surgery.  Medication list from [facility] includes tizanidine, Percocet, Trelegy Ellipta inhaler, Singulair, minocycline, levocetirizine, ibuprofen, fluconazole, iron, Nexium, vitamin D, EpiPen, Cosopt eyedrops, Flexeril, cromolyn eyedrops, Cosentyx injection, clindamycin, amitriptyline, albuterol  Review of Systems: A complete review of systems was obtained from the patient.  I have reviewed this information and discussed as appropriate with the patient.  See HPI as well for other ROS.   Medical History: Past Medical History:  Diagnosis Date   Asthma, unspecified asthma severity, unspecified whether complicated, unspecified whether persistent (HHS-HCC)     There is no problem list on file for this patient.   Past Surgical History:  Procedure Laterality Date   CESAREAN SECTION       Allergies  Allergen Reactions   Shellfish Containing Products Hives, Itching and Swelling   Minocycline Other (See Comments)    Skin hyperpigmentation.  Can tolerate doxycycline (it seems)    Current Outpatient Medications on File Prior to Visit  Medication Sig Dispense Refill   albuterol (PROVENTIL) 2.5 mg /3 mL (0.083 %) nebulizer solution Inhale 2.5 mg into the lungs every 4 (four) hours as needed     albuterol MDI, PROVENTIL, VENTOLIN, PROAIR, HFA 90 mcg/actuation inhaler Inhale 2 inhalations into the lungs every 4 (four) hours as  needed     cefdinir (OMNICEF) 300 mg capsule Take 300 mg by mouth 2 (two) times daily     COSENTYX UNOREADY PEN 300 mg/2 mL (150 mg/mL) PnIj Inject 1 Pen subcutaneously     cromolyn (CROLOM) 4 % ophthalmic solution Apply 1 drop to eye 4 (four) times daily     desogestreL-ethinyl estradioL (APRI) 0.15-0.03 mg tablet Take 1 tablet by mouth once daily     ELIDEL 1 % cream Apply to aa's atopic dermatitis QD-BID PRN flares.     EPINEPHrine (EPIPEN) 0.3 mg/0.3 mL auto-injector      ergocalciferol, vitamin D2, 1,250 mcg (50,000 unit) capsule Take 50,000 Units by mouth every 7 (seven) days     esomeprazole (NEXIUM) 40 MG DR capsule Take 1 capsule by mouth 2 (two) times daily     ferrous sulfate 325 (65 FE) MG tablet Take 325 mg by mouth 2 (two) times daily     fluticasone propionate (FLONASE) 50 mcg/actuation nasal spray      mesalamine (LIALDA) 1.2 gram EC tablet Take 2.4 g by mouth     oxyCODONE-acetaminophen (PERCOCET) 5-325 mg tablet TAKE 1-2 TABLETS BY MOUTH 2 (TWO) TIMES DAILY AS NEEDED FOR SEVERE PAIN.     TRELEGY ELLIPTA 200-62.5-25 mcg inhaler      amitriptyline (ELAVIL) 25 MG tablet Take 25 mg by mouth at bedtime (Patient not  taking: Reported on 02/26/2024)     azelastine (ASTELIN) 137 mcg nasal spray Use 2 sprays into each nostril twice daily (Patient not taking: Reported on 02/26/2024)     clindamycin (CLEOCIN T) 1 % topical solution Apply to open sores QD after shower. (Patient not taking: Reported on 02/26/2024)     docusate (COLACE) 100 MG capsule Take 100 mg by mouth 2 (two) times daily (Patient not taking: Reported on 02/26/2024)     dorzolamide-timoloL (COSOPT) 22.3-6.8 mg/mL ophthalmic solution 1 drop 2 (two) times daily (Patient not taking: Reported on 02/26/2024)     doxycycline (MONODOX) 100 MG capsule Take PO qd with food and bid with flares. (Patient not taking: Reported on 02/26/2024)     ENSKYCE 0.15-0.03 mg tablet Take 1 tablet by mouth once daily (Patient not taking: Reported on 02/26/2024)      fluconazole (DIFLUCAN) 150 MG tablet Take 1 tablet by mouth once daily (Patient not taking: Reported on 02/26/2024)     ibuprofen (MOTRIN) 800 MG tablet Take 800 mg by mouth 3 (three) times daily (Patient not taking: Reported on 02/26/2024)     inj syringe TEZSPIRE 210 mg/1.91 mL (110 mg/mL) 210 mg (Patient not taking: Reported on 02/26/2024)     levocetirizine (XYZAL) 5 MG tablet Take 1 tablet by mouth every day as needed for allergies (Patient not taking: Reported on 02/26/2024)     minocycline (MINOCIN) 100 MG capsule Take 1-2 capsules by mouth daily. (Patient not taking: Reported on 02/26/2024)     montelukast (SINGULAIR) 10 mg tablet Take 1 tablet by mouth at bedtime (Patient not taking: Reported on 02/26/2024)     mupirocin (BACTROBAN) 2 % ointment Apply to any crusted sores at breasts daily prn (Patient not taking: Reported on 02/26/2024)     polyethylene glycol (MIRALAX) packet Take 17 g by mouth once daily (Patient not taking: Reported on 02/26/2024)     predniSONE (DELTASONE) 10 MG tablet Take two tablets (20mg ) twice daily for three days, then one tablet (10mg ) twice daily for three days, then STOP. (Patient not taking: Reported on 02/26/2024)     triamcinolone 0.1 % cream Apply 1 Application topically 2 (two) times daily (Patient not taking: Reported on 02/26/2024)     triamcinolone 0.1 % ointment Apply topically to itchy areas at breast qd/bid prn for rash. Avoid skin under breast (Patient not taking: Reported on 02/26/2024)     TRULANCE 3 mg tablet Take 3 mg by mouth once daily (Patient not taking: Reported on 02/26/2024)     No current facility-administered medications on file prior to visit.    Family History  Problem Relation Age of Onset   Obesity Mother    High blood pressure (Hypertension) Mother    Diabetes Mother    Diabetes Father      Social History   Tobacco Use  Smoking Status Former   Types: Cigarettes  Smokeless Tobacco Never     Social History   Socioeconomic History    Marital status: Divorced  Tobacco Use   Smoking status: Former    Types: Cigarettes   Smokeless tobacco: Never  Vaping Use   Vaping status: Never Used  Substance and Sexual Activity   Alcohol use: Not Currently   Drug use: Never   Social Drivers of Corporate investment banker Strain: Low Risk  (02/20/2024)   Received from [facility]   Overall Financial Resource Strain (CARDIA)    Difficulty of Paying Living Expenses: Not hard at all  Food Insecurity: No Food Insecurity (02/20/2024)   Received from [facility]   Hunger Vital Sign    Worried About Running Out of Food in the Last Year: Never true    Ran Out of Food in the Last Year: Never true  Transportation Needs: No Transportation Needs (02/20/2024)   Received from [facility] - Transportation    Lack of Transportation (Medical): No    Lack of Transportation (Non-Medical): No  Physical Activity: Inactive (02/20/2024)   Received from [facility]   Exercise Vital Sign    Days of Exercise per Week: 0 days    Minutes of Exercise per Session: 0 min  Stress: Stress Concern Present (02/20/2024)   Received from [facility] - Occupational Stress Questionnaire    Feeling of Stress : To some extent  Social Connections: Moderately Isolated (02/20/2024)   Received from [facility]   Social Connection and Isolation Panel [NHANES]    Frequency of Communication with Friends and Family: More than three times a week    Frequency of Social Gatherings with Friends and Family: More than three times a week    Attends Religious Services: More than 4 times per year    Active Member of [employer] or Organizations: No    Attends Banker Meetings: Never    Marital Status: Divorced  Housing Stability: Unknown (02/20/2024)   Received from [facility]   Housing Stability Vital Sign    Unable to Pay for Housing in the Last Year: No    Homeless in the Last Year: No    Objective:    Vitals:    02/26/24 0843  BP: (!) 153/90  Pulse: 85  Temp: 36.8 C (98.2 F)  SpO2: 98%  Weight: (!) 140.3 kg (309 lb 6.4 oz)  Height: 170.2 cm (5' 7.01")     Body mass index is 48.44 kg/m.  Gen: A&Ox3, no distress  Unlabored respirations   Labs, Imaging and Diagnostic Testing: Labs done on 4/26 included normal CMP, lipid panel demonstrating elevated LDL, normal T4 and TSH, additional labs done earlier this month include iron was moderately low, normal ferritin, WBC was 14.8, hemoglobin 12.2, platelets 222, minimally elevated hCG,  Assessment and Plan:  Diagnoses and all orders for this visit:  Morbid obesity (CMS/HHS-HCC)  Osteoarthritis, unspecified osteoarthritis type, unspecified site  Uncomplicated asthma, unspecified asthma severity, unspecified whether persistent (HHS-HCC)  Low iron  Hidradenitis  Chronic pain syndrome  She is a candidate for bariatric surgery and given her history of significant reflux I initially recommended Roux-en-Y gastric bypass.  After further consideration the patient wishes to pursue sleeve.  Given unremarkable appearance of esophagus on endoscopy and what sounds like resolution of reflux symptoms with dietary changes, I think this is reasonable and actually prudent given that she is now on a couple immunomodulators between treatment of her hidradenitis, arthritis, and newly diagnosed colitis.  We reviewed the surgery and she is aware of risks, including bleeding, infection, pain, scarring, injury to intra-abdominal structures, leak, abscess, chronic nausea or abdominal pain, worsened reflux, malnutrition/nutrient deficiency, failure to meet weight loss goals and weight regain.  Her goal is actually to achieve sufficient weight loss to have her hip replaced before we do surgery and she is hopeful she can avoid this but wants to have the pieces in place.   Belen Pesch Carlye Grippe, MD

## 2024-02-26 NOTE — H&P (Signed)
 [name] [id number]   Referring Provider:  Self   Subjective   Chief Complaint: Pre-op Exam (LSG, UGE, HHR/)     History of Present Illness:    Returns for follow-up regarding surgical treatment of morbid obesity.  We had initially discussed Roux-en-Y gastric bypass open history of reflux.  However after further consideration patient wishes to pursue sleeve gastrectomy.  This may actually be prudent as she is now on a couple more immunomodulators.  She reports her reflux is actually much improved since our last visit as she has made some dietary modifications (eliminating red foods).  She has maintained tobacco cessation.  She reports otherwise she has been in good health and is ready to proceed.  She has started the preop diet.  Upper GI 06/11/2023: Small sliding hiatal hernia, no signs of reflux, otherwise normal Chest x-ray 06/11/2023: Negative, stable eventration of right hemidiaphragm Bariatric labs 06/11/2023 notable for mildly elevated LDL, low iron/iron saturation at 26 and 7 respectively which is now improved on more recent labs, globin 12.4, globin A1c 5.7, otherwise labs unremarkable Ultrasound 06/17/2023 for abdominal pain: Negative for gallstones or gallbladder/biliary abnormality other than hepatic steatosis She has completed supervised weight loss with the dietitians and is cleared from that standpoint.  She has completed the preop class.  echocardiogram 07/01/2023 with normal EF and no significant abnormalities  Labs done in January of this year: CBC/LFTs unremarkable; Iron panel with low saturation but otherwise normal; Vitamin B12 low at 190; PCP notes as of July last year she is on iron supplementation and has been on OCP for about the last 6 months for vaginal bleeding now-has held this for surgery.  She did start a new medication for her asthma and has had to take prednisone tapers for exacerbations over the last year.    EGD/colonoscopy 01/26/2024 Dr. Rhea Belton -4 cm  hiatal hernia, Hill grade 4 GE flap valve, gastritis, esophagus normal, duodenum normal -Multiple diverticuli in the sigmoid, descending and ascending colon, segmental moderate mucosal changes characterized by congestion, erythema and loss of vascularity in the sigmoid colon -Pathology benign duodenal mucosa with no diagnostic abnormality; reactive gastropathy and chronic gastritis, negative for H. pylori or metaplasia/dysplasia, chronic focally active colitis negative for dysplasia or granulomas.  Lialda was sent to pharmacy for the segmental colitis and plans for repeat CBC, ferritin and iron studies in June  05/09/23: 48 year old woman presents for consultation regarding surgical treatment of morbid obesity.  She has struggled with this for her entire life, and states that in the early 2000s she was up to about 500 pounds.  She has continued working on this throughout her adulthood and was able to get to a nadir of 289 pounds and was fairly satisfied with this, until she started developing significant arthritis issues.  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  She helps take care of of her grandchildren, 2 of whom she has with her today, and wants to be able to keep up with them as well as to be around for them for a long time. Denies NSAID use currently but has been prescribed Percocet for her hip pain. She has several acquaintances and relatives who have had bariatric surgery and so she has garnered a lot of information from them and in fact has put herself on the high-protein diet, has cut out sweetened  beverages and switch to water, and has actually been losing weight on her own which she does plan to continue doing.  Her primary care doctor is also helping her with this; Insurance denied Reginal Lutes but she is looking into other medical weight loss therapies as well. She struggles with  reflux significantly, she takes a daily high-dose PPI but reports breakthrough symptoms including nighttime regurgitation.  ER visit 1 week ago for persistent vaginal bleeding.  Has follow-up with her gynecologist on June 19.  Previous abdominal surgery includes C-sectionx2 and tubal ligation  Family history notable for diabetes in both parents, hypertension in her mother   Reports that she quit smoking April 1 and while it has been difficult, she has maintained smoking cessation which she did after she learned that she would have to stop smoking in order to have bariatric surgery.  Medication list from [facility] includes tizanidine, Percocet, Trelegy Ellipta inhaler, Singulair, minocycline, levocetirizine, ibuprofen, fluconazole, iron, Nexium, vitamin D, EpiPen, Cosopt eyedrops, Flexeril, cromolyn eyedrops, Cosentyx injection, clindamycin, amitriptyline, albuterol  Review of Systems: A complete review of systems was obtained from the patient.  I have reviewed this information and discussed as appropriate with the patient.  See HPI as well for other ROS.   Medical History: Past Medical History:  Diagnosis Date   Asthma, unspecified asthma severity, unspecified whether complicated, unspecified whether persistent (HHS-HCC)     There is no problem list on file for this patient.   Past Surgical History:  Procedure Laterality Date   CESAREAN SECTION       Allergies  Allergen Reactions   Shellfish Containing Products Hives, Itching and Swelling   Minocycline Other (See Comments)    Skin hyperpigmentation.  Can tolerate doxycycline (it seems)    Current Outpatient Medications on File Prior to Visit  Medication Sig Dispense Refill   albuterol (PROVENTIL) 2.5 mg /3 mL (0.083 %) nebulizer solution Inhale 2.5 mg into the lungs every 4 (four) hours as needed     albuterol MDI, PROVENTIL, VENTOLIN, PROAIR, HFA 90 mcg/actuation inhaler Inhale 2 inhalations into the lungs every 4 (four) hours as  needed     cefdinir (OMNICEF) 300 mg capsule Take 300 mg by mouth 2 (two) times daily     COSENTYX UNOREADY PEN 300 mg/2 mL (150 mg/mL) PnIj Inject 1 Pen subcutaneously     cromolyn (CROLOM) 4 % ophthalmic solution Apply 1 drop to eye 4 (four) times daily     desogestreL-ethinyl estradioL (APRI) 0.15-0.03 mg tablet Take 1 tablet by mouth once daily     ELIDEL 1 % cream Apply to aa's atopic dermatitis QD-BID PRN flares.     EPINEPHrine (EPIPEN) 0.3 mg/0.3 mL auto-injector      ergocalciferol, vitamin D2, 1,250 mcg (50,000 unit) capsule Take 50,000 Units by mouth every 7 (seven) days     esomeprazole (NEXIUM) 40 MG DR capsule Take 1 capsule by mouth 2 (two) times daily     ferrous sulfate 325 (65 FE) MG tablet Take 325 mg by mouth 2 (two) times daily     fluticasone propionate (FLONASE) 50 mcg/actuation nasal spray      mesalamine (LIALDA) 1.2 gram EC tablet Take 2.4 g by mouth     oxyCODONE-acetaminophen (PERCOCET) 5-325 mg tablet TAKE 1-2 TABLETS BY MOUTH 2 (TWO) TIMES DAILY AS NEEDED FOR SEVERE PAIN.     TRELEGY ELLIPTA 200-62.5-25 mcg inhaler      amitriptyline (ELAVIL) 25 MG tablet Take 25 mg by mouth at bedtime (Patient not  taking: Reported on 02/26/2024)     azelastine (ASTELIN) 137 mcg nasal spray Use 2 sprays into each nostril twice daily (Patient not taking: Reported on 02/26/2024)     clindamycin (CLEOCIN T) 1 % topical solution Apply to open sores QD after shower. (Patient not taking: Reported on 02/26/2024)     docusate (COLACE) 100 MG capsule Take 100 mg by mouth 2 (two) times daily (Patient not taking: Reported on 02/26/2024)     dorzolamide-timoloL (COSOPT) 22.3-6.8 mg/mL ophthalmic solution 1 drop 2 (two) times daily (Patient not taking: Reported on 02/26/2024)     doxycycline (MONODOX) 100 MG capsule Take PO qd with food and bid with flares. (Patient not taking: Reported on 02/26/2024)     ENSKYCE 0.15-0.03 mg tablet Take 1 tablet by mouth once daily (Patient not taking: Reported on 02/26/2024)      fluconazole (DIFLUCAN) 150 MG tablet Take 1 tablet by mouth once daily (Patient not taking: Reported on 02/26/2024)     ibuprofen (MOTRIN) 800 MG tablet Take 800 mg by mouth 3 (three) times daily (Patient not taking: Reported on 02/26/2024)     inj syringe TEZSPIRE 210 mg/1.91 mL (110 mg/mL) 210 mg (Patient not taking: Reported on 02/26/2024)     levocetirizine (XYZAL) 5 MG tablet Take 1 tablet by mouth every day as needed for allergies (Patient not taking: Reported on 02/26/2024)     minocycline (MINOCIN) 100 MG capsule Take 1-2 capsules by mouth daily. (Patient not taking: Reported on 02/26/2024)     montelukast (SINGULAIR) 10 mg tablet Take 1 tablet by mouth at bedtime (Patient not taking: Reported on 02/26/2024)     mupirocin (BACTROBAN) 2 % ointment Apply to any crusted sores at breasts daily prn (Patient not taking: Reported on 02/26/2024)     polyethylene glycol (MIRALAX) packet Take 17 g by mouth once daily (Patient not taking: Reported on 02/26/2024)     predniSONE (DELTASONE) 10 MG tablet Take two tablets (20mg ) twice daily for three days, then one tablet (10mg ) twice daily for three days, then STOP. (Patient not taking: Reported on 02/26/2024)     triamcinolone 0.1 % cream Apply 1 Application topically 2 (two) times daily (Patient not taking: Reported on 02/26/2024)     triamcinolone 0.1 % ointment Apply topically to itchy areas at breast qd/bid prn for rash. Avoid skin under breast (Patient not taking: Reported on 02/26/2024)     TRULANCE 3 mg tablet Take 3 mg by mouth once daily (Patient not taking: Reported on 02/26/2024)     No current facility-administered medications on file prior to visit.    Family History  Problem Relation Age of Onset   Obesity Mother    High blood pressure (Hypertension) Mother    Diabetes Mother    Diabetes Father      Social History   Tobacco Use  Smoking Status Former   Types: Cigarettes  Smokeless Tobacco Never     Social History   Socioeconomic History    Marital status: Divorced  Tobacco Use   Smoking status: Former    Types: Cigarettes   Smokeless tobacco: Never  Vaping Use   Vaping status: Never Used  Substance and Sexual Activity   Alcohol use: Not Currently   Drug use: Never   Social Drivers of Corporate investment banker Strain: Low Risk  (02/20/2024)   Received from [facility]   Overall Financial Resource Strain (CARDIA)    Difficulty of Paying Living Expenses: Not hard at all  Food Insecurity: No Food Insecurity (02/20/2024)   Received from [facility]   Hunger Vital Sign    Worried About Running Out of Food in the Last Year: Never true    Ran Out of Food in the Last Year: Never true  Transportation Needs: No Transportation Needs (02/20/2024)   Received from [facility] - Transportation    Lack of Transportation (Medical): No    Lack of Transportation (Non-Medical): No  Physical Activity: Inactive (02/20/2024)   Received from [facility]   Exercise Vital Sign    Days of Exercise per Week: 0 days    Minutes of Exercise per Session: 0 min  Stress: Stress Concern Present (02/20/2024)   Received from [facility] - Occupational Stress Questionnaire    Feeling of Stress : To some extent  Social Connections: Moderately Isolated (02/20/2024)   Received from [facility]   Social Connection and Isolation Panel [NHANES]    Frequency of Communication with Friends and Family: More than three times a week    Frequency of Social Gatherings with Friends and Family: More than three times a week    Attends Religious Services: More than 4 times per year    Active Member of [employer] or Organizations: No    Attends Banker Meetings: Never    Marital Status: Divorced  Housing Stability: Unknown (02/20/2024)   Received from [facility]   Housing Stability Vital Sign    Unable to Pay for Housing in the Last Year: No    Homeless in the Last Year: No    Objective:    Vitals:    02/26/24 0843  BP: (!) 153/90  Pulse: 85  Temp: 36.8 C (98.2 F)  SpO2: 98%  Weight: (!) 140.3 kg (309 lb 6.4 oz)  Height: 170.2 cm (5' 7.01")     Body mass index is 48.44 kg/m.  Gen: A&Ox3, no distress  Unlabored respirations   Labs, Imaging and Diagnostic Testing: Labs done on 4/26 included normal CMP, lipid panel demonstrating elevated LDL, normal T4 and TSH, additional labs done earlier this month include iron was moderately low, normal ferritin, WBC was 14.8, hemoglobin 12.2, platelets 222, minimally elevated hCG,  Assessment and Plan:  Diagnoses and all orders for this visit:  Morbid obesity (CMS/HHS-HCC)  Osteoarthritis, unspecified osteoarthritis type, unspecified site  Uncomplicated asthma, unspecified asthma severity, unspecified whether persistent (HHS-HCC)  Low iron  Hidradenitis  Chronic pain syndrome  She is a candidate for bariatric surgery and given her history of significant reflux I initially recommended Roux-en-Y gastric bypass.  After further consideration the patient wishes to pursue sleeve.  Given unremarkable appearance of esophagus on endoscopy and what sounds like resolution of reflux symptoms with dietary changes, I think this is reasonable and actually prudent given that she is now on a couple immunomodulators between treatment of her hidradenitis, arthritis, and newly diagnosed colitis.  We reviewed the surgery and she is aware of risks, including bleeding, infection, pain, scarring, injury to intra-abdominal structures, leak, abscess, chronic nausea or abdominal pain, worsened reflux, malnutrition/nutrient deficiency, failure to meet weight loss goals and weight regain.  Her goal is actually to achieve sufficient weight loss to have her hip replaced before we do surgery and she is hopeful she can avoid this but wants to have the pieces in place.   Belen Pesch Carlye Grippe, MD

## 2024-02-26 NOTE — Progress Notes (Signed)
 Patient called in with questions regarding her mesalamine which was recently prescribed by her gastroenterologist.  She reports since starting the medication she is no longer having abdominal pain.  Has not noted any side effects but she did read about side effect profile online which caused her some concern.  She want to discuss whether or not she should continue taking the medication.  She was also concerned that it may interact with her antacids.  I discussed with her that this is not a medication I frequently prescribe and therefore I am somewhat unfamiliar with risks and benefits.  However, I recommend she discuss this further with her gastroenterologist.  Currently if she is not experiencing any negative side effects and is finding benefit I would recommend she stay on the medication.  I also used up-to-date drug interaction checker and I did not identify any interaction between mesalamine with Pepcid or interaction between mesalamine with Nexium.  I did relay this information to the patient.  She reports her understanding and will consider reaching out to gastroenterologist for any specific side effect concerns.

## 2024-02-27 ENCOUNTER — Other Ambulatory Visit: Payer: Self-pay | Admitting: Allergy & Immunology

## 2024-03-01 NOTE — Progress Notes (Signed)
 COVID Vaccine Completed:  Date of COVID positive in last 33 days:no  PCP - Jiles Prows, NP Cardiologist - n/a  Chest x-ray - 06/16/23 Epic EKG - 03/02/24 Epic/chart Stress Test - n/a ECHO - 07/01/23 Epic Cardiac Cath - n//a Pacemaker/ICD device last checked: n/a Spinal Cord Stimulator: n/a  Bowel Prep - no solids after 6pm night before. Patient aware  Sleep Study - n/a CPAP -   Fasting Blood Sugar - n/a Checks Blood Sugar _____ times a day  Last dose of GLP1 agonist-  N/A GLP1 instructions:  Hold 7 days before surgery    Last dose of SGLT-2 inhibitors-  N/A SGLT-2 instructions:  Hold 3 days before surgery    Blood Thinner Instructions:  Last dose: n/a  Time: Aspirin Instructions: Last Dose:  Activity level: Can go up a flight of stairs and perform activities of daily living without stopping and without symptoms of chest pain or shortness of breath. Patient has knee and hip pain, ambulates with cane.  Anesthesia review:   Patient denies shortness of breath, fever, cough and chest pain at PAT appointment  Patient verbalized understanding of instructions that were given to them at the PAT appointment. Patient was also instructed that they will need to review over the PAT instructions again at home before surgery.

## 2024-03-01 NOTE — Patient Instructions (Signed)
 SURGICAL WAITING ROOM VISITATION  Patients having surgery or a procedure may have no more than 2 support people in the waiting area - these visitors may rotate.    Children under the age of 87 must have an adult with them who is not the patient.  Due to an increase in RSV and influenza rates and associated hospitalizations, children ages 82 and under may not visit patients in [facility] hospitals.  Visitors with respiratory illnesses are discouraged from visiting and should remain at home.  If the patient needs to stay at the hospital during part of their recovery, the visitor guidelines for inpatient rooms apply. Pre-op nurse will coordinate an appropriate time for 1 support person to accompany patient in pre-op.  This support person may not rotate.    Please refer to the [facility] website for the visitor guidelines for Inpatients (after your surgery is over and you are in a regular room).    Your procedure is scheduled on: 03/09/24   Report to [facility] Main Entrance    Report to admitting at 5:15 AM   Call this number if you have problems the morning of surgery [phone]   MORNING OF SURGERY DRINK:   DRINK 1 G2 drink BEFORE YOU LEAVE HOME, DRINK ALL OF THE  G2 DRINK AT ONE TIME.   NO SOLID FOOD AFTER 600 PM THE NIGHT BEFORE YOUR SURGERY. YOU MAY DRINK CLEAR FLUIDS. THE G2 DRINK YOU DRINK BEFORE YOU LEAVE HOME WILL BE THE LAST FLUIDS YOU DRINK BEFORE SURGERY.  PAIN IS EXPECTED AFTER SURGERY AND WILL NOT BE COMPLETELY ELIMINATED. AMBULATION AND TYLENOL WILL HELP REDUCE INCISIONAL AND GAS PAIN. MOVEMENT IS KEY!  YOU ARE EXPECTED TO BE OUT OF BED WITHIN 4 HOURS OF ADMISSION TO YOUR PATIENT ROOM.  SITTING IN THE RECLINER THROUGHOUT THE DAY IS IMPORTANT FOR DRINKING FLUIDS AND MOVING GAS THROUGHOUT THE GI TRACT.  COMPRESSION STOCKINGS SHOULD BE WORN [facility] STAY UNLESS YOU ARE WALKING.   INCENTIVE SPIROMETER SHOULD BE USED EVERY HOUR WHILE AWAKE TO  DECREASE POST-OPERATIVE COMPLICATIONS SUCH AS PNEUMONIA.  WHEN DISCHARGED HOME, IT IS IMPORTANT TO CONTINUE TO WALK EVERY HOUR AND USE THE INCENTIVE SPIROMETER EVERY HOUR.    You may have the following liquids until 4:30 AM DAY OF SURGERY  Water Non-Citrus Juices (without pulp, NO RED-Apple, White grape, White cranberry) Black Coffee (NO MILK/CREAM OR CREAMERS, sugar ok)  Clear Tea (NO MILK/CREAM OR CREAMERS, sugar ok) regular and decaf                             Plain Jell-O (NO RED)                                           Fruit ices (not with fruit pulp, NO RED)                                     Popsicles (NO RED)  Sports drinks like Gatorade (NO RED)                  The day of surgery:  Drink ONE (1) Pre-Surgery G2 at 4:30 AM the morning of surgery. Drink in one sitting. Do not sip.  This drink was given to you during your hospital  pre-op appointment visit. Nothing else to drink after completing the  Pre-Surgery G2.          If you have questions, please contact your surgeon's office.   FOLLOW BOWEL PREP AND ANY ADDITIONAL PRE OP INSTRUCTIONS YOU RECEIVED FROM YOUR SURGEON'S OFFICE!!!     Oral Hygiene is also important to reduce your risk of infection.                                    Remember - BRUSH YOUR TEETH THE MORNING OF SURGERY WITH YOUR REGULAR TOOTHPASTE  DENTURES WILL BE REMOVED PRIOR TO SURGERY PLEASE DO NOT APPLY "Poly grip" OR ADHESIVES!!!   Stop all vitamins and herbal supplements 7 days before surgery.   Take these medicines the morning of surgery with A SIP OF WATER: Inhalers, Eye drops, Nexium, Singulair, Oxycodone                               You may not have any metal on your body including hair pins, jewelry, and body piercing             Do not wear make-up, lotions, powders, perfumes, or deodorant  Do not wear nail polish including gel and S&S, artificial/acrylic nails, or any  other type of covering on natural nails including finger and toenails. If you have artificial nails, gel coating, etc. that needs to be removed by a nail salon please have this removed prior to surgery or surgery may need to be canceled/ delayed if the surgeon/ anesthesia feels like they are unable to be safely monitored.   Do not shave  48 hours prior to surgery.    Do not bring valuables to the hospital. [facility] IS NOT             RESPONSIBLE   FOR VALUABLES.   Contacts, glasses, dentures or bridgework may not be worn into surgery.   Bring small overnight bag day of surgery.   DO NOT BRING YOUR HOME MEDICATIONS TO THE HOSPITAL. PHARMACY WILL DISPENSE MEDICATIONS LISTED ON YOUR MEDICATION LIST TO YOU DURING YOUR ADMISSION IN THE HOSPITAL!              Please read over the following fact sheets you were given: IF YOU HAVE QUESTIONS ABOUT YOUR PRE-OP INSTRUCTIONS PLEASE CALL [phone] Contras   If you received a COVID test during your pre-op visit  it is requested that you wear a mask when out in public, stay away from anyone that may not be feeling well and notify your surgeon if you develop symptoms. If you test positive for Covid or have been in contact with anyone that has tested positive in the last 10 days please notify you surgeon.    [facility] - Preparing for Surgery Before surgery, you can play an important role.  Because skin is not sterile, your skin needs to be as free of germs as possible.  You can reduce the number of germs on your skin by washing with CHG (  chlorahexidine gluconate) soap before surgery.  CHG is an antiseptic cleaner which kills germs and bonds with the skin to continue killing germs even after washing. Please DO NOT use if you have an allergy to CHG or antibacterial soaps.  If your skin becomes reddened/irritated stop using the CHG and inform your nurse when you arrive at Short Stay. Do not shave (including legs and underarms) for at least 48 hours prior  to the first CHG shower.  You may shave your face/neck.  Please follow these instructions carefully:  1.  Shower with CHG Soap the night before surgery and the  morning of surgery.  2.  If you choose to wash your hair, wash your hair first as usual with your normal  shampoo.  3.  After you shampoo, rinse your hair and body thoroughly to remove the shampoo.                             4.  Use CHG as you would any other liquid soap.  You can apply chg directly to the skin and wash.  Gently with a scrungie or clean washcloth.  5.  Apply the CHG Soap to your body ONLY FROM THE NECK DOWN.   Do   not use on face/ open                           Wound or open sores. Avoid contact with eyes, ears mouth and   genitals (private parts).                       Wash face,  Genitals (private parts) with your normal soap.             6.  Wash thoroughly, paying special attention to the area where your    surgery  will be performed.  7.  Thoroughly rinse your body with warm water from the neck down.  8.  DO NOT shower/wash with your normal soap after using and rinsing off the CHG Soap.                9.  Pat yourself dry with a clean towel.            10.  Wear clean pajamas.            11.  Place clean sheets on your bed the night of your first shower and do not  sleep with pets. Day of Surgery : Do not apply any lotions/deodorants the morning of surgery.  Please wear clean clothes to the hospital/surgery center.  FAILURE TO FOLLOW THESE INSTRUCTIONS MAY RESULT IN THE CANCELLATION OF YOUR SURGERY  PATIENT SIGNATURE_________________________________  NURSE SIGNATURE__________________________________  ________________________________________________________________________ WHAT IS A BLOOD TRANSFUSION? Blood Transfusion Information  A transfusion is the replacement of blood or some of its parts. Blood is made up of multiple cells which provide different functions. Red blood cells carry oxygen and are used  for blood loss replacement. White blood cells fight against infection. Platelets control bleeding. Plasma helps clot blood. Other blood products are available for specialized needs, such as hemophilia or other clotting disorders. BEFORE THE TRANSFUSION  Who gives blood for transfusions?  Healthy volunteers who are fully evaluated to make sure their blood is safe. This is blood bank blood. Transfusion therapy is the safest it has ever been in the practice of medicine. Before blood is  taken from a donor, a complete history is taken to make sure that person has no history of diseases nor engages in risky social behavior (examples are intravenous drug use or sexual activity with multiple partners). The donor's travel history is screened to minimize risk of transmitting infections, such as malaria. The donated blood is tested for signs of infectious diseases, such as HIV and hepatitis. The blood is then tested to be sure it is compatible with you in order to minimize the chance of a transfusion reaction. If you or a relative donates blood, this is often done in anticipation of surgery and is not appropriate for emergency situations. It takes many days to process the donated blood. RISKS AND COMPLICATIONS Although transfusion therapy is very safe and saves many lives, the main dangers of transfusion include:  Getting an infectious disease. Developing a transfusion reaction. This is an allergic reaction to something in the blood you were given. Every precaution is taken to prevent this. The decision to have a blood transfusion has been considered carefully by your caregiver before blood is given. Blood is not given unless the benefits outweigh the risks. AFTER THE TRANSFUSION Right after receiving a blood transfusion, you will usually feel much better and more energetic. This is especially true if your red blood cells have gotten low (anemic). The transfusion raises the level of the red blood cells which  carry oxygen, and this usually causes an energy increase. The nurse administering the transfusion will monitor you carefully for complications. HOME CARE INSTRUCTIONS  No special instructions are needed after a transfusion. You may find your energy is better. Speak with your caregiver about any limitations on activity for underlying diseases you may have. SEEK MEDICAL CARE IF:  Your condition is not improving after your transfusion. You develop redness or irritation at the intravenous (IV) site. SEEK IMMEDIATE MEDICAL CARE IF:  Any of the following symptoms occur over the next 12 hours: Shaking chills. You have a temperature by mouth above 102 F (38.9 C), not controlled by medicine. Chest, back, or muscle pain. People around you feel you are not acting correctly or are confused. Shortness of breath or difficulty breathing. Dizziness and fainting. You get a rash or develop hives. You have a decrease in urine output. Your urine turns a dark color or changes to pink, red, or brown. Any of the following symptoms occur over the next 10 days: You have a temperature by mouth above 102 F (38.9 C), not controlled by medicine. Shortness of breath. Weakness after normal activity. The white part of the eye turns yellow (jaundice). You have a decrease in the amount of urine or are urinating less often. Your urine turns a dark color or changes to pink, red, or brown. Document Released: 11/08/2000 Document Revised: 02/03/2012 Document Reviewed: 06/27/2008 [facility] Patient Information 2014 [place], [place].  _______________________________________________________________________

## 2024-03-02 ENCOUNTER — Encounter (HOSPITAL_COMMUNITY)
Admission: RE | Admit: 2024-03-02 | Discharge: 2024-03-02 | Disposition: A | Discharge status: Home / Self Care | Source: Ambulatory Visit | Attending: Surgery | Admitting: Surgery

## 2024-03-02 ENCOUNTER — Encounter (HOSPITAL_COMMUNITY): Payer: Self-pay

## 2024-03-02 ENCOUNTER — Other Ambulatory Visit: Payer: Self-pay

## 2024-03-02 ENCOUNTER — Ambulatory Visit

## 2024-03-02 VITALS — BP 139/87 | HR 65 | Temp 98.4°F | Resp 14 | Ht 68.0 in | Wt 302.0 lb

## 2024-03-02 DIAGNOSIS — Z01812 Encounter for preprocedural laboratory examination: Secondary | ICD-10-CM | POA: Diagnosis present

## 2024-03-02 DIAGNOSIS — Z6841 Body Mass Index (BMI) 40.0 and over, adult: Secondary | ICD-10-CM | POA: Diagnosis not present

## 2024-03-02 DIAGNOSIS — Z01818 Encounter for other preprocedural examination: Secondary | ICD-10-CM | POA: Diagnosis not present

## 2024-03-02 DIAGNOSIS — Z01810 Encounter for preprocedural cardiovascular examination: Secondary | ICD-10-CM | POA: Diagnosis present

## 2024-03-02 DIAGNOSIS — J4550 Severe persistent asthma, uncomplicated: Secondary | ICD-10-CM | POA: Diagnosis not present

## 2024-03-02 HISTORY — DX: Cardiac murmur, unspecified: R01.1

## 2024-03-02 HISTORY — DX: Iron deficiency anemia, unspecified: D50.9

## 2024-03-02 HISTORY — DX: Unspecified osteoarthritis, unspecified site: M19.90

## 2024-03-02 HISTORY — DX: Gastro-esophageal reflux disease without esophagitis: K21.9

## 2024-03-02 LAB — CBC WITH DIFFERENTIAL/PLATELET
Abs Immature Granulocytes: 0.03 10*3/uL (ref 0.00–0.07)
Basophils Absolute: 0.1 10*3/uL (ref 0.0–0.1)
Basophils Relative: 1 %
Eosinophils Absolute: 0.1 10*3/uL (ref 0.0–0.5)
Eosinophils Relative: 1 %
HCT: 43.5 % (ref 36.0–46.0)
Hemoglobin: 13.7 g/dL (ref 12.0–15.0)
Immature Granulocytes: 0 %
Lymphocytes Relative: 23 %
Lymphs Abs: 2.3 10*3/uL (ref 0.7–4.0)
MCH: 28.4 pg (ref 26.0–34.0)
MCHC: 31.5 g/dL (ref 30.0–36.0)
MCV: 90.1 fL (ref 80.0–100.0)
Monocytes Absolute: 0.6 10*3/uL (ref 0.1–1.0)
Monocytes Relative: 5 %
Neutro Abs: 7.3 10*3/uL (ref 1.7–7.7)
Neutrophils Relative %: 70 %
Platelets: 228 10*3/uL (ref 150–400)
RBC: 4.83 MIL/uL (ref 3.87–5.11)
RDW: 14.6 % (ref 11.5–15.5)
WBC: 10.3 10*3/uL (ref 4.0–10.5)
nRBC: 0 % (ref 0.0–0.2)

## 2024-03-02 LAB — COMPREHENSIVE METABOLIC PANEL WITH GFR
ALT: 15 U/L (ref 0–44)
AST: 14 U/L — ABNORMAL LOW (ref 15–41)
Albumin: 3.4 g/dL — ABNORMAL LOW (ref 3.5–5.0)
Alkaline Phosphatase: 63 U/L (ref 38–126)
Anion gap: 9 (ref 5–15)
BUN: 12 mg/dL (ref 6–20)
CO2: 23 mmol/L (ref 22–32)
Calcium: 9.1 mg/dL (ref 8.9–10.3)
Chloride: 106 mmol/L (ref 98–111)
Creatinine, Ser: 0.67 mg/dL (ref 0.44–1.00)
GFR, Estimated: >60 mL/min (ref >60)
Glucose, Bld: 100 mg/dL — ABNORMAL HIGH (ref 70–99)
Potassium: 4.2 mmol/L (ref 3.5–5.1)
Sodium: 138 mmol/L (ref 135–145)
Total Bilirubin: 0.2 mg/dL (ref 0.0–1.2)
Total Protein: 8 g/dL (ref 6.5–8.1)

## 2024-03-04 ENCOUNTER — Ambulatory Visit: Admitting: Family Medicine

## 2024-03-05 ENCOUNTER — Telehealth: Payer: Self-pay | Admitting: Internal Medicine

## 2024-03-05 NOTE — Telephone Encounter (Signed)
 Patient called and stated that she was trying to take her mesalamine but the pill is to big for her to swallow. Patient stated that if she can get a new prescription for a smaller pill. Please advise.

## 2024-03-05 NOTE — Telephone Encounter (Signed)
 Could try the capsule Apriso 1.5 G Daily

## 2024-03-05 NOTE — Telephone Encounter (Addendum)
 Patient states she is having a gastric sleeve surgery on 03/09/24 and was told by her surgeon she cannot take any pills that over a dime size. Patient reports the mesalamine is too large for her to swallow once she has the surgery. Patient also states the pill container states "do not cut or crush".   Called [facility] and spoke with pharmacist to see if there is another mesalamine that is smaller then Lialda. She informed me that she does not see another mesalamine that is smaller then Lialda but states patient should be able to cut the tablet as long as it is not extended release. Dr. Rhea Belton, can we send in another medication in place of Lialda.

## 2024-03-08 ENCOUNTER — Other Ambulatory Visit: Payer: Self-pay | Admitting: Internal Medicine

## 2024-03-08 MED ORDER — MESALAMINE ER 0.375 G PO CP24: 1.5000 g | ORAL_CAPSULE | Freq: Every day | ORAL | 1 refills | Status: DC

## 2024-03-08 NOTE — Telephone Encounter (Signed)
 Left detailed message informing patient that Dr. Bridgett Camps prescribed Apriso in place of Lialda. Informed patient on voicemail that it may not be covered by her insurance but to return my call with any questions or concerns.

## 2024-03-08 NOTE — Anesthesia Preprocedure Evaluation (Signed)
 Anesthesia Evaluation  Patient identified by MRN, date of birth, ID band Patient awake    Reviewed: Allergy & Precautions, NPO status , Patient's Chart, lab work & pertinent test results  History of Anesthesia Complications Negative for: history of anesthetic complications  Airway Mallampati: I  TM Distance: >3 FB Neck ROM: Full    Dental  (+) Dental Advisory Given, Partial Upper   Pulmonary asthma , former smoker   Pulmonary exam normal        Cardiovascular negative cardio ROS Normal cardiovascular exam   '24 TTE - EF 60 to 65%. There is mild concentric left ventricular hypertrophy. Left atrial size was mildly dilated. Trivial mitral valve regurgitation. Aortic valve regurgitation is trivial.     Neuro/Psych negative neurological ROS  negative psych ROS   GI/Hepatic Neg liver ROS,GERD  Medicated and Controlled,,  Endo/Other    Class 3 obesity  Renal/GU negative Renal ROS     Musculoskeletal  (+) Arthritis ,    Abdominal   Peds  Hematology negative hematology ROS (+)   Anesthesia Other Findings Chronic pain   Reproductive/Obstetrics  s/p tubal ligation                              Anesthesia Physical Anesthesia Plan  ASA: 3  Anesthesia Plan: General   Post-op Pain Management: Tylenol PO (pre-op)* and Gabapentin PO (pre-op)*   Induction: Intravenous  PONV Risk Score and Plan: 3 and Treatment may vary due to age or medical condition, Ondansetron, Scopolamine patch - Pre-op, Midazolam and Dexamethasone  Airway Management Planned: Oral ETT  Additional Equipment: None  Intra-op Plan:   Post-operative Plan: Extubation in OR  Informed Consent: I have reviewed the patients History and Physical, chart, labs and discussed the procedure including the risks, benefits and alternatives for the proposed anesthesia with the patient or authorized representative who has indicated his/her  understanding and acceptance.     Dental advisory given  Plan Discussed with: CRNA and Anesthesiologist  Anesthesia Plan Comments:         Anesthesia Quick Evaluation

## 2024-03-09 ENCOUNTER — Other Ambulatory Visit (HOSPITAL_COMMUNITY): Payer: Self-pay

## 2024-03-09 ENCOUNTER — Inpatient Hospital Stay (HOSPITAL_COMMUNITY)
Admission: RE | Admit: 2024-03-09 | Discharge: 2024-03-10 | DRG: 621 | Disposition: A | Discharge status: Home / Self Care | Attending: Surgery | Admitting: Surgery

## 2024-03-09 ENCOUNTER — Encounter (HOSPITAL_COMMUNITY): Payer: Self-pay | Admitting: Surgery

## 2024-03-09 ENCOUNTER — Inpatient Hospital Stay (HOSPITAL_COMMUNITY): Payer: Self-pay | Admitting: Anesthesiology

## 2024-03-09 ENCOUNTER — Encounter (HOSPITAL_COMMUNITY)
Admission: RE | Disposition: A | Discharge status: Home / Self Care | Payer: Self-pay | Source: Home / Self Care | Attending: Surgery

## 2024-03-09 ENCOUNTER — Other Ambulatory Visit: Payer: Self-pay

## 2024-03-09 ENCOUNTER — Inpatient Hospital Stay (HOSPITAL_COMMUNITY): Payer: Self-pay | Admitting: Physician Assistant

## 2024-03-09 ENCOUNTER — Telehealth (HOSPITAL_COMMUNITY): Payer: Self-pay | Admitting: Pharmacy Technician

## 2024-03-09 DIAGNOSIS — Z87891 Personal history of nicotine dependence: Secondary | ICD-10-CM

## 2024-03-09 DIAGNOSIS — L732 Hidradenitis suppurativa: Secondary | ICD-10-CM | POA: Diagnosis present

## 2024-03-09 DIAGNOSIS — J45909 Unspecified asthma, uncomplicated: Secondary | ICD-10-CM | POA: Diagnosis present

## 2024-03-09 DIAGNOSIS — G894 Chronic pain syndrome: Secondary | ICD-10-CM | POA: Diagnosis present

## 2024-03-09 DIAGNOSIS — K219 Gastro-esophageal reflux disease without esophagitis: Secondary | ICD-10-CM | POA: Diagnosis present

## 2024-03-09 DIAGNOSIS — Z79899 Other long term (current) drug therapy: Secondary | ICD-10-CM | POA: Diagnosis not present

## 2024-03-09 DIAGNOSIS — Z6841 Body Mass Index (BMI) 40.0 and over, adult: Secondary | ICD-10-CM | POA: Diagnosis not present

## 2024-03-09 DIAGNOSIS — Z881 Allergy status to other antibiotic agents status: Secondary | ICD-10-CM

## 2024-03-09 DIAGNOSIS — Z7951 Long term (current) use of inhaled steroids: Secondary | ICD-10-CM | POA: Diagnosis not present

## 2024-03-09 DIAGNOSIS — E611 Iron deficiency: Secondary | ICD-10-CM | POA: Diagnosis not present

## 2024-03-09 DIAGNOSIS — K449 Diaphragmatic hernia without obstruction or gangrene: Secondary | ICD-10-CM | POA: Diagnosis present

## 2024-03-09 DIAGNOSIS — Z91013 Allergy to seafood: Secondary | ICD-10-CM | POA: Diagnosis not present

## 2024-03-09 DIAGNOSIS — Z8249 Family history of ischemic heart disease and other diseases of the circulatory system: Secondary | ICD-10-CM

## 2024-03-09 DIAGNOSIS — Z833 Family history of diabetes mellitus: Secondary | ICD-10-CM

## 2024-03-09 DIAGNOSIS — M1990 Unspecified osteoarthritis, unspecified site: Secondary | ICD-10-CM | POA: Diagnosis present

## 2024-03-09 DIAGNOSIS — K760 Fatty (change of) liver, not elsewhere classified: Secondary | ICD-10-CM | POA: Diagnosis present

## 2024-03-09 DIAGNOSIS — J4540 Moderate persistent asthma, uncomplicated: Secondary | ICD-10-CM | POA: Diagnosis not present

## 2024-03-09 HISTORY — PX: HIATAL HERNIA REPAIR: SHX195

## 2024-03-09 HISTORY — PX: LAPAROSCOPIC GASTRIC SLEEVE RESECTION: SHX5895

## 2024-03-09 HISTORY — PX: UPPER GI ENDOSCOPY: SHX6162

## 2024-03-09 LAB — TYPE AND SCREEN
ABO/RH(D): A POS
Antibody Screen: NEGATIVE

## 2024-03-09 LAB — ABO/RH: ABO/RH(D): A POS

## 2024-03-09 LAB — POCT PREGNANCY, URINE: Preg Test, Ur: NEGATIVE

## 2024-03-09 SURGERY — GASTRECTOMY, SLEEVE, LAPAROSCOPIC
Anesthesia: General

## 2024-03-09 MED ORDER — SODIUM CHLORIDE 0.9 % IR SOLN
Status: DC | PRN
  Administered 2024-03-09: 3000 mL

## 2024-03-09 MED ORDER — ACETAMINOPHEN 160 MG/5ML PO SOLN: 1000.0000 mg | Freq: Three times a day (TID) | ORAL | Status: DC

## 2024-03-09 MED ORDER — SODIUM CHLORIDE 0.9 % IV SOLN
2.0000 g | INTRAVENOUS | Status: AC
  Administered 2024-03-09: 2 g via INTRAVENOUS
  Filled 2024-03-09: qty 2

## 2024-03-09 MED ORDER — BUPIVACAINE HCL (PF) 0.25 % IJ SOLN
INTRAMUSCULAR | Status: AC
  Filled 2024-03-09: qty 30

## 2024-03-09 MED ORDER — OXYCODONE HCL 5 MG/5ML PO SOLN: 5.0000 mg | Freq: Once | ORAL | Status: DC | PRN

## 2024-03-09 MED ORDER — GABAPENTIN 100 MG PO CAPS
100.0000 mg | ORAL_CAPSULE | Freq: Two times a day (BID) | ORAL | Status: DC
  Administered 2024-03-09 – 2024-03-10 (×2): 100 mg via ORAL
  Filled 2024-03-09 (×2): qty 1

## 2024-03-09 MED ORDER — PROPOFOL 10 MG/ML IV BOLUS
INTRAVENOUS | Status: DC | PRN
  Administered 2024-03-09: 200 mg via INTRAVENOUS

## 2024-03-09 MED ORDER — DEXAMETHASONE SODIUM PHOSPHATE 10 MG/ML IJ SOLN
INTRAMUSCULAR | Status: DC | PRN
  Administered 2024-03-09: 5 mg via INTRAVENOUS

## 2024-03-09 MED ORDER — MIDAZOLAM HCL 2 MG/2ML IJ SOLN
INTRAMUSCULAR | Status: AC
  Filled 2024-03-09: qty 2

## 2024-03-09 MED ORDER — HYDROMORPHONE HCL 1 MG/ML IJ SOLN: 0.5000 mg | INTRAMUSCULAR | Status: DC | PRN

## 2024-03-09 MED ORDER — SCOPOLAMINE 1 MG/3DAYS TD PT72
1.0000 | MEDICATED_PATCH | TRANSDERMAL | Status: DC
  Administered 2024-03-09: 1.5 mg via TRANSDERMAL
  Filled 2024-03-09: qty 1

## 2024-03-09 MED ORDER — DOCUSATE SODIUM 100 MG PO CAPS
100.0000 mg | ORAL_CAPSULE | Freq: Two times a day (BID) | ORAL | Status: DC
  Administered 2024-03-09 – 2024-03-10 (×2): 100 mg via ORAL
  Filled 2024-03-09 (×2): qty 1

## 2024-03-09 MED ORDER — HEPARIN SODIUM (PORCINE) 5000 UNIT/ML IJ SOLN
5000.0000 [IU] | INTRAMUSCULAR | Status: AC
  Administered 2024-03-09: 5000 [IU] via SUBCUTANEOUS
  Filled 2024-03-09: qty 1

## 2024-03-09 MED ORDER — SODIUM CHLORIDE 0.9 % IV SOLN: 12.5000 mg | INTRAVENOUS | Status: DC | PRN

## 2024-03-09 MED ORDER — FENTANYL CITRATE PF 50 MCG/ML IJ SOSY
25.0000 ug | PREFILLED_SYRINGE | INTRAMUSCULAR | Status: DC | PRN
  Administered 2024-03-09: 50 ug via INTRAVENOUS

## 2024-03-09 MED ORDER — CHLORHEXIDINE GLUCONATE 4 % EX SOLN: 60.0000 mL | Freq: Once | CUTANEOUS | Status: DC

## 2024-03-09 MED ORDER — ONDANSETRON HCL 4 MG/2ML IJ SOLN: 4.0000 mg | INTRAMUSCULAR | Status: DC | PRN

## 2024-03-09 MED ORDER — AMISULPRIDE (ANTIEMETIC) 5 MG/2ML IV SOLN: 10.0000 mg | Freq: Once | INTRAVENOUS | Status: DC | PRN

## 2024-03-09 MED ORDER — FENTANYL CITRATE PF 50 MCG/ML IJ SOSY
PREFILLED_SYRINGE | INTRAMUSCULAR | Status: AC
  Filled 2024-03-09: qty 2

## 2024-03-09 MED ORDER — METHOCARBAMOL 1000 MG/10ML IJ SOLN
500.0000 mg | Freq: Four times a day (QID) | INTRAMUSCULAR | Status: DC | PRN
  Administered 2024-03-09: 500 mg via INTRAVENOUS
  Filled 2024-03-09: qty 10

## 2024-03-09 MED ORDER — OXYCODONE HCL 5 MG/5ML PO SOLN
5.0000 mg | Freq: Four times a day (QID) | ORAL | Status: DC | PRN
  Administered 2024-03-09 (×2): 5 mg via ORAL
  Filled 2024-03-09 (×2): qty 5

## 2024-03-09 MED ORDER — 0.9 % SODIUM CHLORIDE (POUR BTL) OPTIME
TOPICAL | Status: DC | PRN
  Administered 2024-03-09: 1000 mL

## 2024-03-09 MED ORDER — ONDANSETRON HCL 4 MG/2ML IJ SOLN
INTRAMUSCULAR | Status: DC | PRN
  Administered 2024-03-09: 4 mg via INTRAVENOUS

## 2024-03-09 MED ORDER — ORAL CARE MOUTH RINSE: 15.0000 mL | Freq: Once | OROMUCOSAL | Status: DC

## 2024-03-09 MED ORDER — FENTANYL CITRATE (PF) 250 MCG/5ML IJ SOLN
INTRAMUSCULAR | Status: DC | PRN
  Administered 2024-03-09: 100 ug via INTRAVENOUS
  Administered 2024-03-09: 50 ug via INTRAVENOUS
  Administered 2024-03-09: 100 ug via INTRAVENOUS

## 2024-03-09 MED ORDER — LIDOCAINE HCL 2 % IJ SOLN
INTRAMUSCULAR | Status: AC
  Filled 2024-03-09: qty 20

## 2024-03-09 MED ORDER — LIDOCAINE 20MG/ML (2%) 15 ML SYRINGE OPTIME
INTRAMUSCULAR | Status: DC | PRN
  Administered 2024-03-09: 1.5 mg/kg/h via INTRAVENOUS

## 2024-03-09 MED ORDER — ENSURE MAX PROTEIN PO LIQD
2.0000 [oz_av] | ORAL | Status: DC
  Administered 2024-03-10 (×3): 2 [oz_av] via ORAL

## 2024-03-09 MED ORDER — HYDRALAZINE HCL 20 MG/ML IJ SOLN: 10.0000 mg | INTRAMUSCULAR | Status: DC | PRN

## 2024-03-09 MED ORDER — ROCURONIUM BROMIDE 10 MG/ML (PF) SYRINGE
PREFILLED_SYRINGE | INTRAVENOUS | Status: AC
Start: 2024-03-09 — End: ?
  Filled 2024-03-09: qty 10

## 2024-03-09 MED ORDER — CHLORHEXIDINE GLUCONATE 0.12 % MT SOLN
15.0000 mL | Freq: Once | OROMUCOSAL | Status: DC
  Administered 2024-03-09: 15 mL via OROMUCOSAL

## 2024-03-09 MED ORDER — LACTATED RINGERS IV SOLN: INTRAVENOUS | Status: DC

## 2024-03-09 MED ORDER — ENOXAPARIN (LOVENOX) PATIENT EDUCATION KIT
PACK | Freq: Once | Status: AC
  Filled 2024-03-09: qty 1

## 2024-03-09 MED ORDER — PANTOPRAZOLE SODIUM 40 MG IV SOLR
40.0000 mg | Freq: Every day | INTRAVENOUS | Status: DC
Start: 2024-03-09 — End: 2024-03-10
  Administered 2024-03-09: 40 mg via INTRAVENOUS
  Filled 2024-03-09: qty 10

## 2024-03-09 MED ORDER — PROPOFOL 10 MG/ML IV BOLUS
INTRAVENOUS | Status: AC
  Filled 2024-03-09: qty 20

## 2024-03-09 MED ORDER — HEPARIN SODIUM (PORCINE) 5000 UNIT/ML IJ SOLN
5000.0000 [IU] | Freq: Three times a day (TID) | INTRAMUSCULAR | Status: DC
  Administered 2024-03-09 – 2024-03-10 (×2): 5000 [IU] via SUBCUTANEOUS
  Filled 2024-03-09 (×2): qty 1

## 2024-03-09 MED ORDER — STERILE WATER FOR IRRIGATION IR SOLN
Status: DC | PRN
  Administered 2024-03-09: 1000 mL

## 2024-03-09 MED ORDER — BUPIVACAINE LIPOSOME 1.3 % IJ SUSP: 20.0000 mL | Freq: Once | INTRAMUSCULAR | Status: DC

## 2024-03-09 MED ORDER — OXYCODONE HCL 5 MG PO TABS: 5.0000 mg | ORAL_TABLET | Freq: Once | ORAL | Status: DC | PRN

## 2024-03-09 MED ORDER — KETAMINE HCL 50 MG/5ML IJ SOSY
PREFILLED_SYRINGE | INTRAMUSCULAR | Status: DC | PRN
  Administered 2024-03-09: 30 mg via INTRAVENOUS

## 2024-03-09 MED ORDER — ALBUTEROL SULFATE (2.5 MG/3ML) 0.083% IN NEBU: 3.0000 mL | INHALATION_SOLUTION | RESPIRATORY_TRACT | Status: DC | PRN

## 2024-03-09 MED ORDER — ROCURONIUM BROMIDE 10 MG/ML (PF) SYRINGE
PREFILLED_SYRINGE | INTRAVENOUS | Status: DC | PRN
  Administered 2024-03-09: 20 mg via INTRAVENOUS
  Administered 2024-03-09: 60 mg via INTRAVENOUS

## 2024-03-09 MED ORDER — GABAPENTIN 100 MG PO CAPS
100.0000 mg | ORAL_CAPSULE | ORAL | Status: AC
Start: 2024-03-09 — End: 2024-03-09
  Administered 2024-03-09: 100 mg via ORAL
  Filled 2024-03-09: qty 1

## 2024-03-09 MED ORDER — SODIUM CHLORIDE 0.9 % IV SOLN: INTRAVENOUS | Status: AC

## 2024-03-09 MED ORDER — KETAMINE HCL 50 MG/5ML IJ SOSY
PREFILLED_SYRINGE | INTRAMUSCULAR | Status: AC
  Filled 2024-03-09: qty 5

## 2024-03-09 MED ORDER — SIMETHICONE 80 MG PO CHEW
80.0000 mg | CHEWABLE_TABLET | Freq: Four times a day (QID) | ORAL | Status: DC | PRN
  Administered 2024-03-09 (×2): 80 mg via ORAL
  Filled 2024-03-09 (×2): qty 1

## 2024-03-09 MED ORDER — FENTANYL CITRATE (PF) 250 MCG/5ML IJ SOLN
INTRAMUSCULAR | Status: AC
  Filled 2024-03-09: qty 5

## 2024-03-09 MED ORDER — BUDESON-GLYCOPYRROL-FORMOTEROL 160-9-4.8 MCG/ACT IN AERO
2.0000 | INHALATION_SPRAY | Freq: Two times a day (BID) | RESPIRATORY_TRACT | Status: DC
  Administered 2024-03-09 – 2024-03-10 (×2): 2 via RESPIRATORY_TRACT
  Filled 2024-03-09: qty 5.9

## 2024-03-09 MED ORDER — METOPROLOL TARTRATE 5 MG/5ML IV SOLN: 5.0000 mg | Freq: Four times a day (QID) | INTRAVENOUS | Status: DC | PRN

## 2024-03-09 MED ORDER — BUPIVACAINE HCL (PF) 0.25 % IJ SOLN
INTRAMUSCULAR | Status: DC | PRN
  Administered 2024-03-09: 60 mL

## 2024-03-09 MED ORDER — SUGAMMADEX SODIUM 200 MG/2ML IV SOLN
INTRAVENOUS | Status: DC | PRN
  Administered 2024-03-09: 100 mg via INTRAVENOUS
  Administered 2024-03-09: 200 mg via INTRAVENOUS

## 2024-03-09 MED ORDER — METOCLOPRAMIDE HCL 5 MG/ML IJ SOLN
10.0000 mg | Freq: Four times a day (QID) | INTRAMUSCULAR | Status: DC
  Administered 2024-03-09 – 2024-03-10 (×5): 10 mg via INTRAVENOUS
  Filled 2024-03-09 (×5): qty 2

## 2024-03-09 MED ORDER — APREPITANT 40 MG PO CAPS
40.0000 mg | ORAL_CAPSULE | ORAL | Status: AC
  Administered 2024-03-09: 40 mg via ORAL
  Filled 2024-03-09: qty 1

## 2024-03-09 MED ORDER — TRAMADOL HCL 50 MG PO TABS
50.0000 mg | ORAL_TABLET | Freq: Four times a day (QID) | ORAL | Status: DC | PRN
  Administered 2024-03-10: 50 mg via ORAL
  Filled 2024-03-09: qty 1

## 2024-03-09 MED ORDER — LIDOCAINE HCL (CARDIAC) PF 100 MG/5ML IV SOSY
PREFILLED_SYRINGE | INTRAVENOUS | Status: DC | PRN
  Administered 2024-03-09: 80 mg via INTRAVENOUS

## 2024-03-09 MED ORDER — ONDANSETRON HCL 4 MG/2ML IJ SOLN
INTRAMUSCULAR | Status: AC
  Filled 2024-03-09: qty 2

## 2024-03-09 MED ORDER — ACETAMINOPHEN 500 MG PO TABS
1000.0000 mg | ORAL_TABLET | Freq: Three times a day (TID) | ORAL | Status: DC
  Administered 2024-03-09 – 2024-03-10 (×3): 1000 mg via ORAL
  Filled 2024-03-09 (×3): qty 2

## 2024-03-09 MED ORDER — DEXAMETHASONE SODIUM PHOSPHATE 10 MG/ML IJ SOLN
INTRAMUSCULAR | Status: AC
  Filled 2024-03-09: qty 1

## 2024-03-09 MED ORDER — BUPIVACAINE-EPINEPHRINE (PF) 0.25% -1:200000 IJ SOLN
INTRAMUSCULAR | Status: AC
  Filled 2024-03-09: qty 60

## 2024-03-09 MED ORDER — MIDAZOLAM HCL 2 MG/2ML IJ SOLN
INTRAMUSCULAR | Status: DC | PRN
  Administered 2024-03-09: 2 mg via INTRAVENOUS

## 2024-03-09 MED ORDER — ACETAMINOPHEN 500 MG PO TABS
1000.0000 mg | ORAL_TABLET | ORAL | Status: AC
  Administered 2024-03-09: 1000 mg via ORAL
  Filled 2024-03-09: qty 2

## 2024-03-09 MED ORDER — LIDOCAINE HCL (PF) 2 % IJ SOLN
INTRAMUSCULAR | Status: AC
  Filled 2024-03-09: qty 5

## 2024-03-09 SURGICAL SUPPLY — 67 items
ANTIFOG SOL W/FOAM PAD STRL (MISCELLANEOUS) ×1 IMPLANT
APPLIER CLIP ROT 10 11.4 M/L (STAPLE) IMPLANT
APPLIER CLIP ROT 13.4 12 LRG (CLIP) ×1 IMPLANT
BAG COUNTER SPONGE SURGICOUNT (BAG) IMPLANT
BAG LAPAROSCOPIC 12 15 PORT 16 (BASKET) IMPLANT
BAG RETRIEVAL 12/15 (BASKET) ×1 IMPLANT
BENZOIN TINCTURE PRP APPL 2/3 (GAUZE/BANDAGES/DRESSINGS) ×1 IMPLANT
BLADE SURG SZ11 CARB STEEL (BLADE) ×1 IMPLANT
BNDG ADH 1X3 SHEER STRL LF (GAUZE/BANDAGES/DRESSINGS) ×6 IMPLANT
CHLORAPREP W/TINT 26 (MISCELLANEOUS) ×2 IMPLANT
CLIP APPLIE ROT 10 11.4 M/L (STAPLE) IMPLANT
CLIP APPLIE ROT 13.4 12 LRG (CLIP) IMPLANT
COVER SURGICAL LIGHT HANDLE (MISCELLANEOUS) ×1 IMPLANT
DEVICE SUT QUICK LOAD TK 5 (SUTURE) IMPLANT
DEVICE SUT TI-KNOT TK 5X26 (SUTURE) IMPLANT
DEVICE SUTURE ENDOST 10MM (ENDOMECHANICALS) IMPLANT
DRAPE UTILITY XL STRL (DRAPES) ×2 IMPLANT
ELECT REM PT RETURN 15FT ADLT (MISCELLANEOUS) ×1 IMPLANT
GAUZE SPONGE 4X4 12PLY STRL (GAUZE/BANDAGES/DRESSINGS) IMPLANT
GLOVE BIO SURGEON STRL SZ 6 (GLOVE) ×1 IMPLANT
GLOVE INDICATOR 6.5 STRL GRN (GLOVE) ×1 IMPLANT
GOWN STRL REUS W/ TWL LRG LVL3 (GOWN DISPOSABLE) ×1 IMPLANT
GRASPER SUT TROCAR 14GX15 (MISCELLANEOUS) ×1 IMPLANT
IRRIG SUCT STRYKERFLOW 2 WTIP (MISCELLANEOUS) ×1 IMPLANT
IRRIGATION SUCT STRKRFLW 2 WTP (MISCELLANEOUS) ×1 IMPLANT
KIT BASIN OR (CUSTOM PROCEDURE TRAY) ×1 IMPLANT
KIT TURNOVER KIT A (KITS) IMPLANT
MARKER SKIN DUAL TIP RULER LAB (MISCELLANEOUS) ×1 IMPLANT
MAT PREVALON FULL STRYKER (MISCELLANEOUS) ×1 IMPLANT
NDL SPNL 22GX3.5 QUINCKE BK (NEEDLE) ×1 IMPLANT
NEEDLE SPNL 22GX3.5 QUINCKE BK (NEEDLE) ×1 IMPLANT
PACK UNIVERSAL I (CUSTOM PROCEDURE TRAY) ×1 IMPLANT
RELOAD ENDO STITCH (ENDOMECHANICALS) ×1 IMPLANT
RELOAD STAPLE 60 3.6 BLU REG (STAPLE) ×1 IMPLANT
RELOAD STAPLE 60 3.8 GOLD REG (STAPLE) ×1 IMPLANT
RELOAD STAPLE 60 4.1 GRN THCK (STAPLE) IMPLANT
RELOAD STAPLER BLUE 60MM (STAPLE) ×3 IMPLANT
RELOAD STAPLER GOLD 60MM (STAPLE) ×2 IMPLANT
RELOAD STAPLER GREEN 60MM (STAPLE) IMPLANT
RELOAD SUT TRIPLE-STITCH 2-0 (ENDOMECHANICALS) IMPLANT
SCISSORS LAP 5X45 EPIX DISP (ENDOMECHANICALS) ×1 IMPLANT
SET TUBE SMOKE EVAC HIGH FLOW (TUBING) ×1 IMPLANT
SHEARS HARMONIC 45 ACE (MISCELLANEOUS) ×1 IMPLANT
SLEEVE ADV FIXATION 5X100MM (TROCAR) ×2 IMPLANT
SLEEVE GASTRECTOMY 40FR VISIGI (MISCELLANEOUS) ×1 IMPLANT
SOLUTION ANTFG W/FOAM PAD STRL (MISCELLANEOUS) ×1 IMPLANT
SPIKE FLUID TRANSFER (MISCELLANEOUS) ×1 IMPLANT
SPONGE T-LAP 18X18 ~~LOC~~+RFID (SPONGE) ×1 IMPLANT
STAPLE LINE REINFORCEMENT LAP (STAPLE) IMPLANT
STAPLER ECHELON LONG 3000 60 (ENDOMECHANICALS) IMPLANT
STAPLER ECHELON LONG 60 440 (INSTRUMENTS) ×1 IMPLANT
STAPLER RELOAD BLUE 60MM (STAPLE) ×3 IMPLANT
STAPLER RELOAD GOLD 60MM (STAPLE) ×2 IMPLANT
STAPLER RELOAD GREEN 60MM (STAPLE) IMPLANT
STRIP CLOSURE SKIN 1/2X4 (GAUZE/BANDAGES/DRESSINGS) ×1 IMPLANT
SUT MNCRL AB 4-0 PS2 18 (SUTURE) ×1 IMPLANT
SUT SURGIDAC NAB ES-9 0 48 120 (SUTURE) IMPLANT
SUT VICRYL 0 TIES 12 18 (SUTURE) ×1 IMPLANT
SYR 20ML LL LF (SYRINGE) ×1 IMPLANT
SYS KII OPTICAL ACCESS 15MM (TROCAR) ×1 IMPLANT
SYSTEM KII OPTICAL ACCESS 15MM (TROCAR) ×1 IMPLANT
TAPE STRIPS DRAPE STRL (GAUZE/BANDAGES/DRESSINGS) IMPLANT
TOWEL OR 17X26 10 PK STRL BLUE (TOWEL DISPOSABLE) ×1 IMPLANT
TROCAR ADV FIXATION 5X100MM (TROCAR) ×1 IMPLANT
TROCAR XCEL NON-BLD 5MMX100MML (ENDOMECHANICALS) ×1 IMPLANT
TUBING CONNECTING 10 (TUBING) ×1 IMPLANT
TUBING ENDO SMARTCAP (MISCELLANEOUS) ×1 IMPLANT

## 2024-03-09 NOTE — Interval H&P Note (Signed)
 History and Physical Interval Note:  03/09/2024 6:57 AM  [name]  has presented today for surgery, with the diagnosis of MORBID OBESITY.  The various methods of treatment have been discussed with the patient and family. After consideration of risks, benefits and other options for treatment, the patient has consented to  Procedure(s): GASTRECTOMY, SLEEVE, LAPAROSCOPIC (N/A) ENDOSCOPY, UPPER GI TRACT (N/A) REPAIR, HERNIA, HIATAL (N/A) as a surgical intervention.  The patient's history has been reviewed, patient examined, no change in status, stable for surgery.  I have reviewed the patient's chart and labs.  Questions were answered to the patient's satisfaction.     Kia Stavros Loyola Rummage

## 2024-03-09 NOTE — Progress Notes (Signed)

## 2024-03-09 NOTE — Anesthesia Postprocedure Evaluation (Signed)
 Anesthesia Post Note  Patient: [name]  Procedure(s) Performed: GASTRECTOMY, SLEEVE, LAPAROSCOPIC ENDOSCOPY, UPPER GI TRACT REPAIR, HERNIA, HIATAL     Patient location during evaluation: PACU Anesthesia Type: General Level of consciousness: awake and alert Pain management: pain level controlled Vital Signs Assessment: post-procedure vital signs reviewed and stable Respiratory status: spontaneous breathing, nonlabored ventilation and respiratory function stable Cardiovascular status: blood pressure returned to baseline Anesthetic complications: no   No notable events documented.  Last Vitals:  Vitals:   03/09/24 0913 03/09/24 0915  BP: (!) 161/97 (!) 176/99  Pulse: 79 74  Resp: (!) 21 (!) 21  Temp: 36.6 C   SpO2: 98% 99%    Last Pain:  Vitals:   03/09/24 0925  TempSrc:   PainSc: Asleep                 Juventino Oppenheim

## 2024-03-09 NOTE — Discharge Instructions (Signed)

## 2024-03-09 NOTE — Transfer of Care (Signed)
 Immediate Anesthesia Transfer of Care Note  Patient: [name]  Procedure(s) Performed: GASTRECTOMY, SLEEVE, LAPAROSCOPIC ENDOSCOPY, UPPER GI TRACT REPAIR, HERNIA, HIATAL  Patient Location: PACU  Anesthesia Type:General  Level of Consciousness: awake, alert , and patient cooperative  Airway & Oxygen Therapy: Patient Spontanous Breathing and Patient connected to face mask oxygen  Post-op Assessment: Report given to RN and Post -op Vital signs reviewed and stable  Post vital signs: Reviewed and stable  Last Vitals:  Vitals Value Taken Time  BP 161/97 03/09/24 0912  Temp    Pulse 78 03/09/24 0914  Resp 20 03/09/24 0914  SpO2 100 % 03/09/24 0914  Vitals shown include unfiled device data.  Last Pain:  Vitals:   03/09/24 0602  TempSrc:   PainSc: 9       Patients Stated Pain Goal: 5 (03/09/24 0602)  Complications: No notable events documented.

## 2024-03-09 NOTE — Telephone Encounter (Signed)
 Patient Product/process development scientist completed.    The patient is insured through [facility]. Patient has Medicare and is not eligible for a copay card, but may be able to apply for patient assistance or Medicare RX Payment Plan (Patient Must reach out to their plan, if eligible for payment plan), if available.    Ran test claim for enoxaprin (Lovenox) 40 mg/0.4 ml and the current 30 day co-pay is $0.00.   This test claim was processed through [facility]- copay amounts may vary at other pharmacies due to pharmacy/plan contracts, or as the patient moves through the different stages of their insurance plan.     Morgan Arab, CPHT Pharmacy Technician III Certified Patient Advocate [facility] Patient Advocate Team Direct Number: [phone]  Fax: [phone]

## 2024-03-09 NOTE — Plan of Care (Signed)

## 2024-03-09 NOTE — Op Note (Signed)
 Operative Note  LEAN JAEGER  [id number]  [id number]  03/09/2024   Surgeon: Aldon Hung MD FACS   Assistant: Teddie Favre MD FACS   Procedure performed: laparoscopic sleeve gastrectomy, hiatal hernia repair, upper endoscopy   Preop diagnosis: Morbid obesity Body mass index is 46.19 kg/m. Post-op diagnosis/intraop findings: same   Specimens: fundus Retained items: none  EBL: minimal  Complications: none   Description of procedure: After confirming informed consent and administration of chemical DVT prophylaxis in holding, the patient was taken to the operating room and placed supine on operating room table where general endotracheal anesthesia was initiated, preoperative antibiotics were administered, SCDs applied, and a formal timeout was performed. The abdomen was prepped and draped in usual sterile fashion. Peritoneal access was gained using a Visiport technique in the left upper quadrant and insufflation to 15 mmHg ensued without issue. Gross inspection revealed no evidence of injury. Under direct visualization three more 5 mm trochars were placed in the right and left hemiabdomen and the 15mm trocar in the right paramedian upper abdomen. Bilateral laparoscopic assisted TAPS blocks were performed with 0.25 percent Marcaine with epinephrine. The patient was placed in steep reverseTrendelenburg and the liver retractor was introduced through an incision in the upper midline and secured to the post externally to maintain the left lobe retracted anteriorly.  There was a small hiatal hernia present. The pars lucida was entered with Harmonic scalpel and the posterior aspect of the right and left crus were dissected out using Harmonic and blunt dissection. The dissection was carried anteriorly along the right crus and across the midline onto the left crus, completing a full dissection of the hiatus. The distal esophagus was circumferentially dissected and carefully protected throughout,  confirming that there was no remaining herniated stomach above the diaphragm. The hiatus was narrowed posteriorly with a two simple interrupted 0 Ethibonds secured with ty-knots.  Using the Harmonic scalpel, the greater curvature of the stomach was dissected away from the greater omentum and short gastric vessels were divided. This began 6 cm from the pylorus, and dissection proceeded until the left crus was clearly exposed. There were some filmy adhesions of the posterior stomach to the retroperitoneum which were divided with the Harmonic. The 31 [place] VisiGi was then introduced and directed down towards the pylorus. This was placed to suction against the lesser curve. Serial fires of the linear cutting stapler were then employed to create our sleeve. The first fire used a gold load and ensured adequate room at the angularis incisura. Several blue loads were then employed to create an evenly tubular stomach preserving 1cm lateral to the angle of His. We did use staple line reinforcements for the first two (most distal) staple fires. The excised stomach was then removed through our 15 mm trocar site within an Endo Catch bag.  The visigi was taken off of suction and a few puffs of air were introduced, inflating the sleeve. No bubbles were observed in the irrigation fluid around the stomach and the shape was noted to be evenly tubular without any narrowing at the angularis. The visigi was then removed. Upper endoscopy was performed by the assistant surgeon and the sleeve was noted to be airtight, the staple line was hemostatic and the lumen easily traversed to the level of the pylorus. Please see his separate note. The endoscope was removed. A small amount of oozing on the staple line was addressed with clips. The 15 mm trocar site fascia in the right upper abdomen was closed  with a 0 Vicryl using the laparoscopic suture passer under direct visualization. The liver retractor was removed under direct visualization.  The abdomen was then desufflated and all remaining trochars removed. The skin incisions were closed with subcuticular 4-0 Monocryl; benzoin, Steri-Strips and Band-Aids were applied The patient was then awakened, extubated and taken to PACU in stable condition.     All counts were correct at the completion of the case.

## 2024-03-09 NOTE — Anesthesia Procedure Notes (Signed)
 Procedure Name: Intubation Date/Time: 03/09/2024 7:29 AM  Performed by: Vergia Glasgow, CRNAPre-anesthesia Checklist: Patient identified, Emergency Drugs available, Suction available, Patient being monitored and Timeout performed Patient Re-evaluated:Patient Re-evaluated prior to induction Oxygen Delivery Method: Circle system utilized Preoxygenation: Pre-oxygenation with 100% oxygen Induction Type: IV induction Ventilation: Mask ventilation without difficulty Laryngoscope Size: Glidescope and 4 Grade View: Grade I Tube type: Oral Tube size: 7.5 mm Number of attempts: 2 Airway Equipment and Method: Rigid stylet and Video-laryngoscopy Placement Confirmation: ETT inserted through vocal cords under direct vision, positive ETCO2 and breath sounds checked- equal and bilateral Secured at: 24 cm Tube secured with: Tape Dental Injury: Teeth and Oropharynx as per pre-operative assessment  Comments: 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   First attempt by student - unable to visualize tracheal opening.   Second attempt CRNA - full view of cords - endotube passed smoothly.

## 2024-03-09 NOTE — Progress Notes (Signed)
 PHARMACY CONSULT FOR:  Risk Assessment for Post-Discharge VTE Following Bariatric Surgery  Procedure* sleeve gastrectomy  Sex F  Black race Yes  Age (years) 48  BMI (kg/m2) 46  Operation duration (minutes) 80  History of VTE requiring treatment* No  Hypercoagulable condition* No  Liver disorder* No  Pre-op venous stasis No  Pre-op functional health status Independent  Previous foregut or bariatric surg No  Post-op surgical site infection No  Transfusion intra- or post-op* No  Unplanned readmission No  Unplanned reoperation No  GI perforation/leak/obstruction* No  *specific risk factors for portomesenteric venous thrombosis   Predicted probability of 30-day post-discharge VTE:    0.47 % estimated using the [facility] Calculator  Recommendation for Discharge: Enoxaparin 40 mg [place] q12h x 2 weeks post-discharge   [name] is a 48 y.o. female who underwent sleeve gastrectomy on 03/09/24.   Case start: 0743 Case end: 0903   Allergies  Allergen Reactions   Shellfish-Derived Products Hives, Itching and Swelling   Minocycline Other (See Comments)    Skin hyperpigmentation.  Can tolerate doxycycline (it seems)  Other Reaction(s): Other (See Comments)  Skin hyperpigmentation. Can tolerate doxycycline (it seems)    Patient Measurements: Height: 5\' 8"  (172.7 cm) Weight: (!) 137.8 kg (303 lb 12.8 oz) IBW/kg (Calculated) : 63.9 Body mass index is 46.19 kg/m.  No results for input(s): "WBC", "HGB", "HCT", "PLT", "APTT", "CREATININE", "LABCREA", "CREAT24HRUR", "MG", "PHOS", "ALBUMIN", "PROT", "AST", "ALT", "ALKPHOS", "BILITOT", "BILIDIR", "IBILI" in the last 72 hours. Estimated Creatinine Clearance: 128.3 mL/min (by C-G formula based on SCr of 0.67 mg/dL).    Past Medical History:  Diagnosis Date   Arthritis    Asthma    Diverticulitis of colon    GERD (gastroesophageal reflux disease)    Heart murmur    Hydradenitis    Iron (Fe)  deficiency anemia    MRSA (methicillin resistant staph aureus) culture positive    Obesity      Facility-Administered Medications Prior to Admission  Medication Dose Route Frequency Provider Last Rate Last Admin   tezepelumab-ekko (TEZSPIRE) 210 MG/1. syringe 210 mg  210 mg Subcutaneous Q28 days Rochester Chuck, MD   210 mg at 03/02/24 4098   Medications Prior to Admission  Medication Sig Dispense Refill Last Dose/Taking   albuterol (PROVENTIL) (2.5 MG/3ML) 0.083% nebulizer solution Inhale one vial via nebulizer every 4 hours as needed for wheezing or shortness of breath 75 mL 11 Past Month   cefdinir (OMNICEF) 300 MG capsule Take 300 mg by mouth 2 (two) times daily.   Past Month   ELIDEL 1 % cream Apply to aa's atopic dermatitis QD-BID PRN flares. (Patient taking differently: Apply 1 Application topically 2 (two) times daily. Apply to aa's atopic dermatitis QD-BID PRN flares.) 60 g 3 Past Month   ENSKYCE 0.15-30 MG-MCG tablet Take 1 tablet by mouth every day 84 tablet 11 Past Month   EPINEPHrine 0.3 mg/0.3 mL IJ SOAJ injection Inject 0.3ml subcutaneously as needed for anaphylaxis 2 mL 1 Taking   ferrous sulfate (FEROSUL) 325 (65 FE) MG tablet Take 1 tab by mouth twice daily, AM & at bedtime 60 tablet 3 02/24/2024   mesalamine (APRISO) 0.375 g 24 hr capsule Take 4 capsules (1.5 g total) by mouth daily. 120 capsule 1 Past Month   oxyCODONE-acetaminophen (PERCOCET/ROXICET) 5-325 MG tablet Take 1-2 tablets by mouth 2 (two) times daily. (Patient taking differently: Take 1-2 tablets by mouth See admin instructions. Take 2 tablets  in the morning, take 1 tablet at 4 pm & take 1 tablet at 8pm) 120 tablet 0 Past Month   Plecanatide (TRULANCE) 3 MG TABS TAKE 1 TABLET BY MOUTH DAILY (Patient taking differently: Take 1 tablet by mouth at bedtime.) 30 tablet 3 Past Month   Respiratory Therapy Supplies (NEBULIZER MASK ADULT/TUBING) MISC 1 each by Does not apply route daily. 1 each 1 Past Month    Secukinumab (COSENTYX UNOREADY) 300 MG/2ML SOAJ Inject 300 mg into the skin every 28 (twenty-eight) days. 2 mL 1 02/29/2024   Spacer/Aero-Holding Chambers (AEROCHAMBER PLUS) inhaler Use as instructed 1 each 2 Past Month   TEZSPIRE 210 MG/1. syringe 210 mg.   03/02/2024   TRELEGY ELLIPTA 200-62.5-25 MCG/ACT AEPB Inhale 1 puff by mouth every day 60 each 11 03/08/2024 at  4:00 AM   Vitamin D, Ergocalciferol, (DRISDOL) 1.25 MG (50000 UNIT) CAPS capsule Take 1 capsule by mouth once a week (Patient taking differently: Take 50,000 Units by mouth every Monday.) 4 capsule 1 02/24/2024   albuterol (VENTOLIN HFA) 108 (90 Base) MCG/ACT inhaler Inhale 2 puffs by mouth every 4 hours as needed for wheezing/shortness of breath 8.5 each 11 More than a month   Azelastine HCl 137 MCG/SPRAY SOLN Use 2 sprays into each nostril twice daily (Patient taking differently: Place 2 sprays into both nostrils 2 (two) times daily as needed (allergies.).) 30 mL 3 More than a month   clindamycin (CLEOCIN T) 1 % external solution Apply to open sores QD after shower. (Patient taking differently: Apply 1 Application topically daily as needed (wounds). Apply to open sores QD after shower.) 60 mL 5 More than a month   cromolyn (OPTICROM) 4 % ophthalmic solution Place 1 drop into both eyes 4 (four) times daily. 10 mL 5 More than a month   esomeprazole (NEXIUM) 40 MG capsule Take 1 capsule by mouth twice daily 60 capsule 11 More than a month   fluconazole (DIFLUCAN) 150 MG tablet Take 1 tablet (150 mg total) by mouth once a week. (Patient taking differently: Take 150 mg by mouth every Tuesday.) 4 tablet 2 More than a month   levocetirizine (XYZAL) 5 MG tablet Take 1 tablet by mouth every day as needed for allergies (Patient not taking: Reported on 02/27/2024) 30 tablet 11 More than a month   montelukast (SINGULAIR) 10 MG tablet TAKE 1 TABLET BY MOUTH AT  BEDTIME (Patient taking differently: Take 10 mg by mouth daily as needed (allergies).) 100  tablet 4 More than a month       Livian Vanderbeck P 03/09/2024,10:15 AM

## 2024-03-09 NOTE — Op Note (Signed)
   Patient: [name] (07-Oct-1976, [id number])  Date of Surgery: 03/09/2024  Preoperative Diagnosis: MORBID OBESITY   Postoperative Diagnosis: MORBID OBESITY   Surgical Procedure: Upper Endoscopy   Surgeon: Teddie Favre, MD  Anesthesiologist: Juventino Oppenheim, MD CRNA: Vergia Glasgow, CRNA; Williford, Peggy D, CRNA   Anesthesia: General   Fluids:  Total I/O In: 100 [IV Piggyback:100] Out: 10 [Blood:10]  Complications: None  Drains:  None  Specimen: None   Indications for Procedure: [name] is a 48 y.o. female undergoing sleeve gastrectomy and an EGD was requested to evaluate foregut anatomy intraoperatively.  Description of Procedure: During the procedure, I scrubbed out and obtained the Olympus endoscope. I gently placed endoscope in the patient's oropharynx and gently glided it down the esophagus without any difficulty under direct visualization.  The scope was advanced as far as the Pylorus and then slowly withdrawn to inspect the foregut anatomy.  Dr. Lanell Pinta had placed saline in the upper abdomen and all staple lines were submerged to ensure no air leak. There was no evidence of bubbles. There was no evidence of intraluminal bleeding and the mucosa appeared healthy.  The lumen was widely patent without evidence of stricture.  The intraluminal insufflation was decompressed. The scope was withdrawn. The patient tolerated this portion of the procedure well. Please see Dr Beatris Lincoln operative note for details regarding the remainder of the procedure.    Teddie Favre, MD General, Bariatric, & Minimally Invasive Surgery [facility], [place]

## 2024-03-10 ENCOUNTER — Other Ambulatory Visit (HOSPITAL_COMMUNITY): Payer: Self-pay

## 2024-03-10 ENCOUNTER — Encounter (HOSPITAL_COMMUNITY): Payer: Self-pay | Admitting: Surgery

## 2024-03-10 LAB — BASIC METABOLIC PANEL WITH GFR
Anion gap: 5 (ref 5–15)
BUN: 7 mg/dL (ref 6–20)
CO2: 25 mmol/L (ref 22–32)
Calcium: 8.8 mg/dL — ABNORMAL LOW (ref 8.9–10.3)
Chloride: 106 mmol/L (ref 98–111)
Creatinine, Ser: 0.55 mg/dL (ref 0.44–1.00)
GFR, Estimated: >60 mL/min (ref >60)
Glucose, Bld: 97 mg/dL (ref 70–99)
Potassium: 3.6 mmol/L (ref 3.5–5.1)
Sodium: 136 mmol/L (ref 135–145)

## 2024-03-10 LAB — CBC
HCT: 39.1 % (ref 36.0–46.0)
Hemoglobin: 12 g/dL (ref 12.0–15.0)
MCH: 28.1 pg (ref 26.0–34.0)
MCHC: 30.7 g/dL (ref 30.0–36.0)
MCV: 91.6 fL (ref 80.0–100.0)
Platelets: 193 10*3/uL (ref 150–400)
RBC: 4.27 MIL/uL (ref 3.87–5.11)
RDW: 14.3 % (ref 11.5–15.5)
WBC: 11.1 10*3/uL — ABNORMAL HIGH (ref 4.0–10.5)
nRBC: 0 % (ref 0.0–0.2)

## 2024-03-10 LAB — MAGNESIUM: Magnesium: 1.8 mg/dL (ref 1.7–2.4)

## 2024-03-10 LAB — SURGICAL PATHOLOGY

## 2024-03-10 MED ORDER — PANTOPRAZOLE SODIUM 40 MG PO TBEC
40.0000 mg | DELAYED_RELEASE_TABLET | Freq: Every day | ORAL | 0 refills | Status: DC
  Filled 2024-03-10: qty 90, 90d supply, fill #0

## 2024-03-10 MED ORDER — POTASSIUM CHLORIDE 20 MEQ PO PACK
20.0000 meq | PACK | Freq: Once | ORAL | Status: AC
  Administered 2024-03-10: 20 meq via ORAL
  Filled 2024-03-10: qty 1

## 2024-03-10 MED ORDER — ONDANSETRON 4 MG PO TBDP
4.0000 mg | ORAL_TABLET | Freq: Four times a day (QID) | ORAL | 0 refills | Status: DC | PRN
Start: 2024-03-10 — End: 2024-10-15
  Filled 2024-03-10: qty 20, 5d supply, fill #0

## 2024-03-10 MED ORDER — GABAPENTIN 100 MG PO CAPS
100.0000 mg | ORAL_CAPSULE | Freq: Two times a day (BID) | ORAL | 0 refills | Status: DC
  Filled 2024-03-10: qty 10, 5d supply, fill #0

## 2024-03-10 MED ORDER — MAGNESIUM SULFATE 2 GM/50ML IV SOLN
2.0000 g | Freq: Once | INTRAVENOUS | Status: AC
  Administered 2024-03-10: 2 g via INTRAVENOUS
  Filled 2024-03-10: qty 50

## 2024-03-10 MED ORDER — ACETAMINOPHEN 500 MG PO TABS: 1000.0000 mg | ORAL_TABLET | Freq: Three times a day (TID) | ORAL | Status: AC

## 2024-03-10 MED ORDER — DOCUSATE SODIUM 100 MG PO CAPS
100.0000 mg | ORAL_CAPSULE | Freq: Two times a day (BID) | ORAL | 0 refills | Status: AC
  Filled 2024-03-10: qty 30, 15d supply, fill #0

## 2024-03-10 MED ORDER — ENOXAPARIN SODIUM 40 MG/0.4ML IJ SOSY
40.0000 mg | PREFILLED_SYRINGE | Freq: Two times a day (BID) | INTRAMUSCULAR | 0 refills | Status: DC
  Filled 2024-03-10: qty 11.2, 14d supply, fill #0

## 2024-03-10 NOTE — Progress Notes (Signed)
 S: No acute events. Reports some shoulder pain and her hip is bothering her. She has been walking regardless. Tolerating liquids without issue.   O: Vitals, labs, intake/output, and orders reviewed at this time.  Afebrile, no tachycardia, moderately hypertensive throughout the night, respirations 18 saturating 94% on room air.  P.o. intake 590; urine output 700+1 occurrence.  BMP unremarkable, potassium 3.8, creatinine 0.55, magnesium 1.8; WBC 11.1 (10.3 preop), hemoglobin 12.0 (13.7), platelets 193 (228).  PRN meds: Tramadol x 1 at 03 10, simethicone x 2 yesterday, oxycodone x 2 yesterday, Robaxin x 1 yesterday morning.  Gen: A&Ox3, no distress  H&N: EOMI, atraumatic, neck supple Chest: unlabored respirations, RRR Abd: soft, mildly tender around right paramedian port site (extraction site), nondistended, incision(s) c/d/i without cellulitis or hematoma Ext: warm, no edema Neuro: grossly normal  Lines/tubes/drains: PIV  A/P: Postop day 1 status post laparoscopic sleeve gastrectomy with hiatal hernia repair - Continue clear liquids and protein shakes - Continue aggressive pulmonary toilet, ambulate frequently, SCDs while in bed - Plan discharge later today.  She will discharge on a prophylactic Lovenox regimen.   Aldon Hung, MD [facility], [place]

## 2024-03-10 NOTE — Progress Notes (Signed)
   03/10/24 0942  TOC Brief Assessment  Insurance and Status Reviewed  Patient has primary care physician Yes  Home environment has been reviewed resides in an apartment  Prior level of function: Independent  Prior/Current Home Services No current home services  Social Drivers of Health Review SDOH reviewed no interventions necessary  Readmission risk has been reviewed Yes  Transition of care needs no transition of care needs at this time

## 2024-03-10 NOTE — Discharge Summary (Signed)
 Physician Discharge Summary  [name] ZOX:[id number] DOB: Mar 19, 1976 DOA: 03/09/2024  PCP: Zorita Hiss, NP  Admit date: 03/09/2024 Discharge date: 03/10/24  Recommendations for Outpatient Follow-up:    Follow-up Information     Adalberto Acton, MD Follow up on 03/31/2024.   Specialty: General Surgery Why: Please arrive 15 minutes prior to your appointment at 2:20pm Contact information: [address] [place] [address] [phone]         Adalberto Acton, MD Follow up on 05/05/2024.   Specialty: General Surgery Why: Please arrive 15 minutes prior to your appointment 9:20am Contact information: [address] [place] [address] [phone]                Discharge Diagnoses:  Principal Problem:   Morbid obesity (HCC)   Surgical Procedure: Laparoscopic Sleeve Gastrectomy, upper endoscopy  Discharge Condition: Good Disposition: Home  Diet recommendation: Postoperative sleeve gastrectomy diet (liquids only)  Filed Weights   03/09/24 0602  Weight: (!) 137.8 kg     Hospital Course:  The patient was admitted for a planned laparoscopic sleeve gastrectomy. Please see operative note. Preoperatively the patient was given 5000 units of subcutaneous heparin for DVT prophylaxis. Postoperative prophylactic heparin dosing was started on the evening of postoperative day 0. ERAS protocol was used. On the evening of postoperative day 0, the patient was started on water and ice chips. On postoperative day 1 the patient had no fever or tachycardia and was tolerating water and their diet was gradually advanced throughout the day. The patient was ambulating without difficulty. Their vital signs are stable without fever or tachycardia. Their hemoglobin had remained stable. The patient had received discharge instructions and counseling. They were deemed stable for discharge and had met discharge criteria   Discharge  Instructions   Allergies as of 03/10/2024       Reactions   Shellfish-derived Products Hives, Itching, Swelling   Minocycline Other (See Comments)   Skin hyperpigmentation. Can tolerate doxycycline (it seems) Other Reaction(s): Other (See Comments) Skin hyperpigmentation. Can tolerate doxycycline (it seems)        Medication List     STOP taking these medications    esomeprazole 40 MG capsule Commonly known as: NEXIUM       TAKE these medications    acetaminophen 500 MG tablet Commonly known as: TYLENOL Take 2 tablets (1,000 mg total) by mouth every 8 (eight) hours for 5 days.   AeroChamber Plus inhaler Use as instructed   albuterol (2.5 MG/3ML) 0.083% nebulizer solution Commonly known as: PROVENTIL Inhale one vial via nebulizer every 4 hours as needed for wheezing or shortness of breath   albuterol 108 (90 Base) MCG/ACT inhaler Commonly known as: VENTOLIN HFA Inhale 2 puffs by mouth every 4 hours as needed for wheezing/shortness of breath   Azelastine HCl 137 MCG/SPRAY Soln Use 2 sprays into each nostril twice daily   cefdinir 300 MG capsule Commonly known as: OMNICEF Take 300 mg by mouth 2 (two) times daily.   clindamycin 1 % external solution Commonly known as: CLEOCIN T Apply to open sores QD after shower.   Cosentyx UnoReady 300 MG/2ML Soaj Generic drug: Secukinumab Inject 300 mg into the skin every 28 (twenty-eight) days.   cromolyn 4 % ophthalmic solution Commonly known as: OPTICROM Place 1 drop into both eyes 4 (four) times daily.   docusate sodium 100 MG capsule Commonly known as: Colace Take 1 capsule (100 mg total) by mouth  2 (two) times daily. Okay to decrease to once daily or stop taking if having loose bowel movements   Elidel 1 % cream Generic drug: pimecrolimus Apply to aa's atopic dermatitis QD-BID PRN flares. What changed:  how much to take how to take this when to take this   enoxaparin 40 MG/0.4ML injection Commonly known  as: LOVENOX Inject 0.4 mLs (40 mg total) into the skin every 12 (twelve) hours for 14 days.   Enskyce 0.15-30 MG-MCG tablet Generic drug: desogestrel-ethinyl estradiol Take 1 tablet by mouth every day   EPINEPHrine 0.3 mg/0.3 mL Soaj injection Commonly known as: EPI-PEN Inject 0.42ml subcutaneously as needed for anaphylaxis   FeroSul 325 (65 Fe) MG tablet Generic drug: ferrous sulfate Take 1 tab by mouth twice daily, AM & at bedtime   fluconazole 150 MG tablet Commonly known as: DIFLUCAN Take 1 tablet (150 mg total) by mouth once a week.   gabapentin 100 MG capsule Commonly known as: NEURONTIN Take 1 capsule (100 mg total) by mouth every 12 (twelve) hours for 5 days.   levocetirizine 5 MG tablet Commonly known as: XYZAL Take 1 tablet by mouth every day as needed for allergies   mesalamine 0.375 g 24 hr capsule Commonly known as: Apriso Take 4 capsules (1.5 g total) by mouth daily.   montelukast 10 MG tablet Commonly known as: SINGULAIR TAKE 1 TABLET BY MOUTH AT  BEDTIME   Nebulizer Mask Adult/Tubing Misc 1 each by Does not apply route daily.   ondansetron 4 MG disintegrating tablet Commonly known as: ZOFRAN-ODT Take 1 tablet (4 mg total) by mouth every 6 (six) hours as needed for nausea or vomiting.   oxyCODONE-acetaminophen 5-325 MG tablet Commonly known as: PERCOCET/ROXICET Take 1-2 tablets by mouth 2 (two) times daily. What changed:  when to take this additional instructions   pantoprazole 40 MG tablet Commonly known as: PROTONIX Take 1 tablet (40 mg total) by mouth daily. Take this medication daily, regardless of reflux symptoms   Tezspire 210 MG/1. syringe Generic drug: tezepelumab-ekko 210 mg.   Trelegy Ellipta 200-62.5-25 MCG/ACT Aepb Generic drug: Fluticasone-Umeclidin-Vilant Inhale 1 puff by mouth every day   Trulance 3 MG Tabs Generic drug: Plecanatide TAKE 1 TABLET BY MOUTH DAILY What changed:  how much to take when to take this    Vitamin D (Ergocalciferol) 1.25 MG (50000 UNIT) Caps capsule Commonly known as: DRISDOL Take 1 capsule by mouth once a week What changed: when to take this        Follow-up Information     Berna Bue, MD Follow up on 03/31/2024.   Specialty: General Surgery Why: Please arrive 15 minutes prior to your appointment at 2:20pm Contact information: [address] [place] [address] [phone]         Berna Bue, MD Follow up on 05/05/2024.   Specialty: General Surgery Why: Please arrive 15 minutes prior to your appointment 9:20am Contact information: [address] [place] [address] [phone]                  The results of significant diagnostics from this hospitalization (including imaging, microbiology, ancillary and laboratory) are listed below for reference.    Significant Diagnostic Studies: No results found.  Labs: Basic Metabolic Panel: Recent Labs  Lab 03/10/24 0442  NA 136  K 3.6  CL 106  CO2 25  GLUCOSE 97  BUN 7  CREATININE 0.55  CALCIUM 8.8*  MG 1.8  Liver Function Tests: No results for input(s): "AST", "ALT", "ALKPHOS", "BILITOT", "PROT", "ALBUMIN" in the last 168 hours.  CBC: Recent Labs  Lab 03/10/24 0442  WBC 11.1*  HGB 12.0  HCT 39.1  MCV 91.6  PLT 193    CBG: No results for input(s): "GLUCAP" in the last 168 hours.  Principal Problem:   Morbid obesity (HCC)   Signed:  Adalberto Acton, MD [facility] [phone] 03/10/2024, 7:40 AM

## 2024-03-10 NOTE — Progress Notes (Signed)
 Discharge medications delivered to patient at bedside. Discharge instructions given to patient questions ask and answered D Rosevelt Constable RN

## 2024-03-10 NOTE — Progress Notes (Signed)
 Lovenox discharge teaching done with patient Questions ask and answered D  Rosevelt Constable RN

## 2024-03-10 NOTE — Care Management Important Message (Signed)
 Important Message  Patient Details IM Letter given. Name: [name] MRN: [id number] Date of Birth: [birth year]   Important Message Given:  Yes - Medicare IM     [name] 03/10/2024, 1:27 PM

## 2024-03-11 ENCOUNTER — Other Ambulatory Visit (HOSPITAL_COMMUNITY): Payer: Self-pay

## 2024-03-11 ENCOUNTER — Telehealth: Payer: Self-pay | Admitting: *Deleted

## 2024-03-11 NOTE — Transitions of Care (Post Inpatient/ED Visit) (Signed)
 03/11/2024  Name: [name] MRN: [id number] DOB: 1976-09-11  Today's TOC FU Call Status: Today's TOC FU Call Status:: Successful TOC FU Call Completed TOC FU Call Complete Date: 03/11/24 Patient's Name and Date of Birth confirmed.  Transition Care Management Follow-up Telephone Call Date of Discharge: 03/10/24 Discharge Facility: [facility]) Type of Discharge: Inpatient Admission Primary Inpatient Discharge Diagnosis:: Planned surgical gastric sleeve How have you been since you were released from the hospital?: Better ("I am doing okay; pain is under control and I am doing what they told me to.  No problems so far.  Feeling good overall") Any questions or concerns?: No  Items Reviewed: Did you receive and understand the discharge instructions provided?: Yes (thoroughly reviewed with patient who verbalizes good understanding of same) Medications obtained,verified, and reconciled?: Yes (Medications Reviewed) (Full medication reconciliation/ review completed; no concerns or discrepancies identified; confirmed patient obtained/ is taking all newly Rx'd medications as instructed; self-manages medications and denies questions/ concerns around medications today) Any new allergies since your discharge?: No Dietary orders reviewed?: Yes Type of Diet Ordered:: "Liquid after the surgery" Do you have support at home?: Yes People in Home [RPT]: child(ren), adult, grandchild(ren) Name of Support/Comfort Primary Source: Reports independent in self-care activities; resides with supportive adult daughter - assists as/ if needed/ indicated  Medications Reviewed Today: Medications Reviewed Today     Reviewed by Michaela Corner, RN (Registered Nurse) on 03/11/24 at 1452  Med List Status: <None>   Medication Order Taking? Sig Documenting Provider Last Dose Status Informant  acetaminophen (TYLENOL) 500 MG tablet [id number] No Take 2 tablets (1,000 mg total) by mouth every 8 (eight) hours for  5 days.  Patient not taking: Reported on 03/11/2024   Berna Bue, MD Not Taking Active            Med Note Jonnie Kind Mar 11, 2024  2:43 PM) 03/11/24: Reports during [facility] call, she is going to pick up from outpatient pharmacy today   albuterol (PROVENTIL) (2.5 MG/3ML) 0.083% nebulizer solution [id number] Yes Inhale one vial via nebulizer every 4 hours as needed for wheezing or shortness of breath Marcelyn Bruins, MD Taking Active Self  albuterol (VENTOLIN HFA) 108 (90 Base) MCG/ACT inhaler [id number] Yes Inhale 2 puffs by mouth every 4 hours as needed for wheezing/shortness of breath Alfonse Spruce, MD Taking Active Self  Azelastine HCl 137 MCG/SPRAY SOLN [id number] Yes Use 2 sprays into each nostril twice daily  Patient taking differently: Place 2 sprays into both nostrils 2 (two) times daily as needed (allergies.).   Alfonse Spruce, MD Taking Active Self  cefdinir (OMNICEF) 300 MG capsule [id number] No Take 300 mg by mouth 2 (two) times daily.  Patient not taking: Reported on 03/11/2024   [provider] Not Taking Active Self           Med Note Michaela Corner   Thu Mar 11, 2024  2:30 PM) 03/11/24- reports during [facility] call this was completed "2 weeks before my surgery like they told me to"  clindamycin (CLEOCIN T) 1 % external solution [id number] Yes Apply to open sores QD after shower.  Patient taking differently: Apply 1 Application topically daily as needed (wounds). Apply to open sores QD after shower.   Willeen Niece, MD Taking Active Self  cromolyn (OPTICROM) 4 % ophthalmic solution [id number] Yes Place 1 drop into both eyes 4 (four) times daily. Alfonse Spruce, MD Taking Active Self  docusate sodium (COLACE) 100 MG capsule [id number] Yes Take 1 capsule (100 mg total) by mouth 2 (two) times daily. Okay to decrease to once daily or stop taking if having loose bowel movements Berna Bue, MD Taking Active   ELIDEL 1 % cream [id number]  Yes Apply to aa's atopic dermatitis QD-BID PRN flares.  Patient taking differently: Apply 1 Application topically 2 (two) times daily. Apply to aa's atopic dermatitis QD-BID PRN flares.   Willeen Niece, MD Taking Active Self  enoxaparin (LOVENOX) 40 MG/0.4ML injection [id number] Yes Inject 0.4 mLs (40 mg total) into the skin every 12 (twelve) hours for 14 days. Berna Bue, MD Taking Active   ENSKYCE 0.15-30 MG-MCG tablet [id number] No Take 1 tablet by mouth every day  Patient not taking: Reported on 03/11/2024   Conan Bowens, MD Not Taking Active Self           Med Note Jonnie Kind Mar 11, 2024  2:34 PM) 03/11/24: Reports during [facility] call, she was told to "stop taking 2 weeks before surgery;" states "the surgeon will tell me when to start taking again;"  Currently not taking  EPINEPHrine 0.3 mg/0.3 mL IJ SOAJ injection [id number] Yes Inject 0.85ml subcutaneously as needed for anaphylaxis Nehemiah Settle, FNP Taking Active Self           Med Note Bernerd Pho, BESSIE R   Tue Mar 09, 2024  6:01 AM) Has not needed  ferrous sulfate (FEROSUL) 325 (65 FE) MG tablet [id number] No Take 1 tab by mouth twice daily, AM & at bedtime  Patient not taking: Reported on 03/11/2024   Elenore Paddy, NP Not Taking Active Self           Med Note Michaela Corner   Thu Mar 11, 2024  2:35 PM) 03/11/24: Reports during TOC call, she was told to "stop taking 2 weeks before surgery;" states "the surgeon will tell me when to start taking again;"  Currently not taking   fluconazole (DIFLUCAN) 150 MG tablet [id number] No Take 1 tablet (150 mg total) by mouth once a week.  Patient not taking: Reported on 03/11/2024   Willeen Niece, MD Not Taking Active Self           Med Note Jonnie Kind Mar 11, 2024  2:35 PM) 03/11/24: Reports during [facility] call, she was told to "stop taking 2 weeks before surgery;" states "the surgeon will tell me when to start taking again;"  Currently not taking   gabapentin (NEURONTIN) 100 MG  capsule [id number] Yes Take 1 capsule (100 mg total) by mouth every 12 (twelve) hours for 5 days. Berna Bue, MD Taking Active   levocetirizine (XYZAL) 5 MG tablet [id number] No Take 1 tablet by mouth every day as needed for allergies  Patient not taking: No sig reported   Alfonse Spruce, MD Not Taking Active Self           Med Note Michaela Corner   Thu Mar 11, 2024  2:36 PM) 03/11/24: Reports during TOC call, she was told to "stop taking 2 weeks before surgery;" states "the surgeon will tell me when to start taking again;"  Currently not taking   mesalamine (APRISO) 0.375 g 24 hr capsule [id number] Yes Take 4 capsules (1.5 g total) by mouth daily. Beverley Fiedler, MD Taking Active   montelukast (SINGULAIR) 10 MG tablet [id number] No TAKE 1 TABLET BY MOUTH AT  BEDTIME  Patient not taking: Reported on 03/11/2024   Rochester Chuck, MD Not Taking Active Self           Med Note (Lynsay Fesperman M   Thu Mar 11, 2024  2:37 PM) 03/11/24: Reports during [facility] call, she was told to "stop taking 2 weeks before surgery;" states "the surgeon will tell me when to start taking again;"  Currently not taking   ondansetron (ZOFRAN-ODT) 4 MG disintegrating tablet [id number] Yes Take 1 tablet (4 mg total) by mouth every 6 (six) hours as needed for nausea or vomiting. Adalberto Acton, MD Taking Active   oxyCODONE-acetaminophen (PERCOCET/ROXICET) 5-325 MG tablet [id number] Yes Take 1-2 tablets by mouth 2 (two) times daily.  Patient taking differently: Take 1-2 tablets by mouth See admin instructions. Take 2 tablets in the morning, take 1 tablet at 4 pm & take 1 tablet at 8pm   Thomas, Eunice L, NP Taking Active Self  pantoprazole (PROTONIX) 40 MG tablet [id number] Yes Take 1 tablet (40 mg total) by mouth daily. Take this medication daily, regardless of reflux symptoms Adalberto Acton, MD Taking Active   Plecanatide (TRULANCE) 3 MG TABS [id number] No TAKE 1 TABLET BY MOUTH DAILY  Patient not taking: Reported  on 03/11/2024   Zorita Hiss, NP Not Taking Active Self           Med Note (Temima Kutsch M   Thu Mar 11, 2024  2:40 PM) 03/11/24: Reports during [facility] call, she was told to "stop taking 2 weeks before surgery;" states "the surgeon will tell me when to start taking again;"  Currently not taking   Respiratory Therapy Supplies (NEBULIZER MASK ADULT/TUBING) MISC [id number] Yes 1 each by Does not apply route daily. Rochester Chuck, MD Taking Active Self  Secukinumab [facility]) 300 MG/2ML [place] [id number] Yes Inject 300 mg into the skin every 28 (twenty-eight) days. Artemio Larry, MD Taking Active Self  Spacer/Aero-Holding Chambers (AEROCHAMBER PLUS) inhaler [id number] Yes Use as instructed Ethlyn Herd, MD Taking Active Self  tezepelumab-ekko (TEZSPIRE) 210 MG/1. syringe 210 mg [id number]   Rochester Chuck, MD  Active   TEZSPIRE 210 MG/1. syringe [id number] No 210 mg.  Patient not taking: Reported on 03/11/2024   [provider] Not Taking Active Self           Med Note (Shahin Knierim M   Thu Mar 11, 2024  2:39 PM) 03/11/24: Reports during [facility] call, her insurance company did not approve this medication   TRELEGY ELLIPTA 200-62.5-25 MCG/ACT AEPB [id number] Yes Inhale 1 puff by mouth every day Rochester Chuck, MD Taking Active Self  Vitamin D, Ergocalciferol, (DRISDOL) 1.25 MG (50000 UNIT) CAPS capsule [id number] No Take 1 capsule by mouth once a week  Patient not taking: Reported on 03/11/2024   Zorita Hiss, NP Not Taking Active Self           Med Note (Seichi Kaufhold M   Thu Mar 11, 2024  2:40 PM) 03/11/24: Reports during TOC call, she was told to "stop taking 2 weeks before surgery;" states "the surgeon will tell me when to start taking again;"  Currently not taking            Home Care and Equipment/Supplies: Were Home Health Services Ordered?: No Any new equipment or medical supplies ordered?: No  Functional Questionnaire: Do you need assistance with  bathing/showering or dressing?: No Do you need assistance with meal preparation?: No Do you need assistance with eating?: No Do  you have difficulty maintaining continence: No Do you need assistance with getting out of bed/getting out of a chair/moving?: No Do you have difficulty managing or taking your medications?: No  Follow up appointments reviewed: PCP Follow-up appointment confirmed?: Yes Date of PCP follow-up appointment?: 04/02/24 (verified this is recommended time frame for follow up per hospital discharging provider notes) Follow-up Provider: PCP Specialist Hospital Follow-up appointment confirmed?: Yes Date of Specialist follow-up appointment?: 03/31/24 (verified this is recommended time frame for follow up per hospital discharging provider notes) Follow-Up Specialty Provider:: Surgical provider Do you need transportation to your follow-up appointment?: No Do you understand care options if your condition(s) worsen?: Yes-patient verbalized understanding  SDOH Interventions Today    Flowsheet Row Most Recent Value  SDOH Interventions   Food Insecurity Interventions Intervention Not Indicated  Housing Interventions Intervention Not Indicated  Transportation Interventions Intervention Not Indicated  [drives self]  Utilities Interventions Intervention Not Indicated      See TOC assessment tabs for additional assessment/ TOC intervention information  Total time spent from review to signing of note/ including any care coordination interventions:  50 minutes  Pls call/ message for questions,  Rahkeem Senft Mckinney Ocie Tino, RN, BSN, Media planner  Transitions of Care  [facility] [phone]: direct office

## 2024-03-16 ENCOUNTER — Telehealth (HOSPITAL_COMMUNITY): Payer: Self-pay | Admitting: *Deleted

## 2024-03-16 NOTE — Telephone Encounter (Signed)
 1. Tell me about your pain and pain management?     Pt denies pain at this time.   2. Let's talk about fluid intake. How much total fluid are you taking in?   Pt states that she is getting in at least 40oz of fluid including protein shakes, bottled water , and broth Pt states that she is working to meet goal of 64 oz of fluid today. Pt encouraged to continue to work towards meeting goal.   3. How much protein have you taken in the last day?    Pt states she is meeting the goal of 60g of protein each day with the protein shakes.     4. Have you had nausea? Tell me about when you have experienced nausea and what you did to help?   Pt denies nausea.   5. Has the frequency or color changed with your urine?   Pt did not report any changes in frequency or urgency.   6. Tell me what your incisions look like?   "Incisions look fine". Pt denies a fever, chills. Pt states incisions are not swollen, open, or draining. Pt only reported that her mid abdominal incision was more bruised than the others. Pt encouraged to call [facility] if incisions change or concerns increase.    7. Have you been passing gas? BM?   Pt states that they are having BMs. Last BM 04/17  Pt states that they have had a BM. Pt instructed to take either Miralax or MoM as instructed per "Gastric Bypass/Sleeve Discharge Home Care Instructions". Pt to call surgeon's office if not able to have BM with medication.    8. If a problem or question were to arise who would you call? Do you know contact numbers for [facility], [facility], and [facility]?   Pt knows to call [facility] for surgical, [facility] for nutrition, and [facility] for non-urgent questions or concerns. Pt denies dehydration symptoms. Pt can describe s/sx of dehydration.   9. How has the walking going?   Pt states s/he is walking around and able to be active without difficulty.   10. Are you still using your incentive spirometer? If so, how often?   Pt states that she is doing cough and deep  breathing, she uses a pillow to support her abdomen.   11. How are your vitamins and calcium going? How are you taking them?    Pt states that s/he is taking his/her supplements and vitamins without difficulty.    12. How has the anticoagulant Lovenox  been going?   LOVENOX : Pt states that s/he is taking the Lovenox  injections without difficulty. Reinforced education about taking injections q12h and rotating injection sites. Pt also instructed to monitor for unusual bruising and/or signs of bleeding.

## 2024-03-18 ENCOUNTER — Ambulatory Visit: Payer: 59 | Admitting: Allergy & Immunology

## 2024-03-23 ENCOUNTER — Encounter: Attending: Surgery | Admitting: Dietician

## 2024-03-23 ENCOUNTER — Other Ambulatory Visit: Payer: Self-pay | Admitting: Nurse Practitioner

## 2024-03-23 ENCOUNTER — Other Ambulatory Visit: Payer: Self-pay | Admitting: Dermatology

## 2024-03-23 ENCOUNTER — Encounter: Payer: Self-pay | Admitting: Dietician

## 2024-03-23 VITALS — Ht 68.0 in | Wt 284.1 lb

## 2024-03-23 DIAGNOSIS — L732 Hidradenitis suppurativa: Secondary | ICD-10-CM

## 2024-03-23 DIAGNOSIS — Z6841 Body Mass Index (BMI) 40.0 and over, adult: Secondary | ICD-10-CM | POA: Diagnosis not present

## 2024-03-23 DIAGNOSIS — E669 Obesity, unspecified: Secondary | ICD-10-CM | POA: Insufficient documentation

## 2024-03-23 DIAGNOSIS — Z713 Dietary counseling and surveillance: Secondary | ICD-10-CM | POA: Diagnosis not present

## 2024-03-23 NOTE — Progress Notes (Signed)
 2 Week Post-Operative Nutrition Class   Patient was seen on 03/23/2024 for Post-Operative Nutrition education at the [facility].    Surgery date: 03/09/2024 Surgery type: Sleeve Gastrectomy  Anthropometrics  Start weight at [facility]: 324.7 lbs (date: 06/09/2023)  Height: 68 in Weight today: 284.1 lb   Clinical   Pharmacotherapy: History of weight loss medication used: zepbound  has not been approved Medical hx: reflux, asthma Medications: see list   Labs: LDL 126; iron 32; RDW 18.9 Notable signs/symptoms: none noted Any previous deficiencies? No Bowel Habits: Every day to every other day no complaints   Body Composition Scale 03/23/2024  Current Body Weight 284.1  Total Body Fat % 47.2  Visceral Fat 16  Fat-Free Mass % 52.7   Total Body Water  % 40.8  Muscle-Mass lbs 33.4  BMI 43.2  Body Fat Displacement          Torso  lbs 83.2         Left Leg  lbs 16.6         Right Leg  lbs 16.6         Left Arm  lbs 8.3         Right Arm  lbs 8.3    The following the learning objectives were met by the patient during this course: Identifies Soft Prepped Plan Advancement Guide  Identifies Soft, High Proteins (Phase 1), beginning 2 weeks post-operatively to 3 weeks post-operatively Identifies Additional Soft High Proteins, soft non-starchy vegetables, fruits and starches (Phase 2), beginning 3 weeks post-operatively to 3 months post-operatively Identifies appropriate sources of fluids, proteins, vegetables, fruits and starches Identifies appropriate fat sources and healthy verses unhealthy fat types   States protein, vegetable, fruit and starch recommendations and appropriate sources post-operatively Identifies the need for appropriate texture modifications, mastication, and bite sizes when consuming solids Identifies appropriate fat consumption and sources Identifies appropriate multivitamin and calcium sources post-operatively Describes the need for  physical activity post-operatively and will follow MD recommendations States when to call healthcare provider regarding medication questions or post-operative complications   Handouts given during class include: Soft Prepped Plan Advancement Guide   Follow-Up Plan: Patient will follow-up at [facility] in 10 weeks for 3 month post-op nutrition visit for diet advancement per MD.

## 2024-03-24 NOTE — Progress Notes (Signed)
 Subjective:    Patient ID: [name], female    DOB: 06-05-1976, 48 y.o.   MRN: [id number]  HPI: [name] is a 48 y.o. female who returns for follow up appointment for chronic pain and medication refill. She states her pain is located in her lower back radiating into her right lower extremity, right hip and left knee pain. Also reports post op pain in abdomen. She  rates her pain 7.Her  current exercise regime is walking and performing stretching exercises.  Ms. Mctear underwent on 03/09/2024: Dr. Richad Champagne and Dr Lanell Pinta GASTRECTOMY, SLEEVE, LAPAROSCOPIC N/A General  ENDOSCOPY, UPPER GI TRACT N/A General  REPAIR, HERNIA, HIATAL      Ms. [place] Morphine equivalent is 30.00 MME.   Oral Swab was Performed today.   Pain Inventory Average Pain 9 Pain Right Now 7 My pain is constant, sharp, burning, stabbing, tingling, and aching  In the last 24 hours, has pain interfered with the following? General activity 9 Relation with others 7 Enjoyment of life 8 What TIME of day is your pain at its worst? morning , daytime, evening, and night Sleep (in general)  poor-fair  Pain is worse with: walking, bending, sitting, inactivity, standing, and some activites Pain improves with: heat/ice and medication Relief from Meds: 9  Family History  Problem Relation Age of Onset   Diabetes Mother    Hypertension Mother    Diabetes Father    Asthma Other    Hyperlipidemia Other    Hypertension Other    Colon cancer Neg Hx    Esophageal cancer Neg Hx    Rectal cancer Neg Hx    Stomach cancer Neg Hx    Social History   Socioeconomic History   Marital status: Single    Spouse name: Not on file   Number of children: Not on file   Years of education: Not on file   Highest education level: 11th grade  Occupational History   Occupation: disabled  Tobacco Use   Smoking status: Former    Current packs/day: 0.33    Types: Cigarettes    Passive exposure: Current    Smokeless tobacco: Never  Vaping Use   Vaping status: Never Used  Substance and Sexual Activity   Alcohol use: Not Currently   Drug use: No   Sexual activity: Yes    Birth control/protection: None  Other Topics Concern   Not on file  Social History Narrative   Lives with her daughter   Social Drivers of Health   Financial Resource Strain: Low Risk  (02/20/2024)   Overall Financial Resource Strain (CARDIA)    Difficulty of Paying Living Expenses: Not hard at all  Food Insecurity: No Food Insecurity (03/11/2024)   Hunger Vital Sign    Worried About Running Out of Food in the Last Year: Never true    Ran Out of Food in the Last Year: Never true  Transportation Needs: No Transportation Needs (03/11/2024)   PRAPARE - Administrator, Civil Service (Medical): No    Lack of Transportation (Non-Medical): No  Physical Activity: Inactive (02/20/2024)   Exercise Vital Sign    Days of Exercise per Week: 0 days    Minutes of Exercise per Session: 0 min  Stress: Stress Concern Present (02/20/2024)   [employer] of Occupational Health - Occupational Stress Questionnaire    Feeling of Stress : To some extent  Social Connections: Moderately Isolated (03/09/2024)   Social Connection and Isolation Panel [NHANES]  Frequency of Communication with Friends and Family: More than three times a week    Frequency of Social Gatherings with Friends and Family: More than three times a week    Attends Religious Services: More than 4 times per year    Active Member of Clubs or Organizations: No    Attends Engineer, structural: Never    Marital Status: Divorced   Past Surgical History:  Procedure Laterality Date   CESAREAN SECTION     x2   HIATAL HERNIA REPAIR N/A 03/09/2024   Procedure: REPAIR, HERNIA, HIATAL;  Surgeon: Adalberto Acton, MD;  Location: [facility];  Service: General;  Laterality: N/A;   LAPAROSCOPIC GASTRIC SLEEVE RESECTION N/A 03/09/2024   Procedure: GASTRECTOMY,  SLEEVE, LAPAROSCOPIC;  Surgeon: Adalberto Acton, MD;  Location: [facility];  Service: General;  Laterality: N/A;   TUBAL LIGATION     UPPER GI ENDOSCOPY N/A 03/09/2024   Procedure: ENDOSCOPY, UPPER GI TRACT;  Surgeon: Adalberto Acton, MD;  Location: [facility];  Service: General;  Laterality: N/A;   WISDOM TOOTH EXTRACTION  2011   Past Surgical History:  Procedure Laterality Date   CESAREAN SECTION     x2   HIATAL HERNIA REPAIR N/A 03/09/2024   Procedure: REPAIR, HERNIA, HIATAL;  Surgeon: Adalberto Acton, MD;  Location: [facility];  Service: General;  Laterality: N/A;   LAPAROSCOPIC GASTRIC SLEEVE RESECTION N/A 03/09/2024   Procedure: GASTRECTOMY, SLEEVE, LAPAROSCOPIC;  Surgeon: Adalberto Acton, MD;  Location: [facility];  Service: General;  Laterality: N/A;   TUBAL LIGATION     UPPER GI ENDOSCOPY N/A 03/09/2024   Procedure: ENDOSCOPY, UPPER GI TRACT;  Surgeon: Adalberto Acton, MD;  Location: [facility];  Service: General;  Laterality: N/A;   WISDOM TOOTH EXTRACTION  2011   Past Medical History:  Diagnosis Date   Arthritis    Asthma    Diverticulitis of colon    GERD (gastroesophageal reflux disease)    Heart murmur    Hydradenitis    Iron (Fe) deficiency anemia    MRSA (methicillin resistant staph aureus) culture positive    Obesity    BP 121/87   Pulse 85   Ht 5\' 8"  (1.727 m)   Wt 287 lb 6.4 oz (130.4 kg)   LMP 02/19/2024 Comment: POC Pregnancy test (-) negative on 03/09/2024  SpO2 95%   BMI 43.70 kg/m   Opioid Risk Score:   Fall Risk Score:  `1  Depression screen [facility] 2/9     03/25/2024    8:54 AM 02/20/2024    9:38 AM 12/15/2023    9:29 AM 12/01/2023    9:35 AM 10/31/2023    9:57 AM 06/09/2023   10:04 AM 04/25/2023    9:49 AM  Depression screen PHQ 2/9  Decreased Interest 0 0 0 0 0 0 0  Down, Depressed, Hopeless  0 0 0 0 0 0  PHQ - 2 Score 0 0 0 0 0 0 0  Altered sleeping  0     0  Tired, decreased energy  0     0  Change in appetite  0     0  Feeling bad or failure about  yourself   0       Trouble concentrating  0     0  Moving slowly or fidgety/restless  0     0  Suicidal thoughts  0     0  PHQ-9 Score  0  0  Difficult doing work/chores  Not difficult at all         Review of Systems  Gastrointestinal:  Positive for abdominal pain.  Musculoskeletal:  Positive for gait problem.       Left knee, right hip  All other systems reviewed and are negative.      Objective:   Physical Exam        Assessment & Plan:  Primary  Osteoarthritis of Right Hip: Continue HEP as Tolerated. Continue current medication regimen/ Continue to Monitor. 03/25/2024 Chronic Pain of Left Knee : Ortho Following. Continue to Monitor. Continue HEP as Tolerated. 03/25/2024 Acute Left Ankle Pain: No complaints today. Ortho following.  Continue to Monitor. 03/25/2024. Morbid Obesity: S/P:  GASTRECTOMY, SLEEVE, LAPAROSCOPIC N/A General  ENDOSCOPY, UPPER GI TRACT N/A General  REPAIR, HERNIA, HIATAL    Continue Healthy Diet Regimen. Continue to Monitor. 03/25/2024. Chronic Pain Syndrome: Refilled: Oxycodone  5-325 mg 1-2 two tablets twice a day as needed for pain #120. Second script sent for the following month. We will continue the opioid monitoring program, this consists of regular clinic visits, examinations, urine drug screen, pill counts as well as use of [place]  Controlled Substance Reporting system. A 12 month History has been reviewed on the [place]  Controlled Substance Reporting System on 03/25/2024   F/U in 2 months

## 2024-03-25 ENCOUNTER — Encounter: Payer: Self-pay | Admitting: Registered Nurse

## 2024-03-25 ENCOUNTER — Encounter: Attending: Physical Medicine and Rehabilitation | Admitting: Registered Nurse

## 2024-03-25 VITALS — BP 121/87 | HR 85 | Ht 68.0 in | Wt 287.4 lb

## 2024-03-25 DIAGNOSIS — M5416 Radiculopathy, lumbar region: Secondary | ICD-10-CM

## 2024-03-25 DIAGNOSIS — M1611 Unilateral primary osteoarthritis, right hip: Secondary | ICD-10-CM | POA: Diagnosis not present

## 2024-03-25 DIAGNOSIS — M25562 Pain in left knee: Secondary | ICD-10-CM

## 2024-03-25 DIAGNOSIS — G8929 Other chronic pain: Secondary | ICD-10-CM | POA: Diagnosis not present

## 2024-03-25 DIAGNOSIS — G894 Chronic pain syndrome: Secondary | ICD-10-CM | POA: Diagnosis not present

## 2024-03-25 DIAGNOSIS — Z79891 Long term (current) use of opiate analgesic: Secondary | ICD-10-CM | POA: Diagnosis not present

## 2024-03-25 DIAGNOSIS — Z5181 Encounter for therapeutic drug level monitoring: Secondary | ICD-10-CM | POA: Diagnosis not present

## 2024-03-25 MED ORDER — OXYCODONE-ACETAMINOPHEN 5-325 MG PO TABS: 1.0000 | ORAL_TABLET | Freq: Two times a day (BID) | ORAL | 0 refills | Status: DC

## 2024-03-29 ENCOUNTER — Ambulatory Visit: Payer: 59 | Admitting: Physical Medicine and Rehabilitation

## 2024-03-30 ENCOUNTER — Ambulatory Visit

## 2024-03-30 LAB — DRUG TOX MONITOR 1 W/CONF, ORAL FLD
Amphetamines: NEGATIVE ng/mL (ref <10)
Barbiturates: NEGATIVE ng/mL (ref <10)
Benzodiazepines: NEGATIVE ng/mL (ref <0.50)
Buprenorphine: NEGATIVE ng/mL (ref <0.10)
Cocaine: NEGATIVE ng/mL (ref <5.0)
Codeine: NEGATIVE ng/mL (ref <2.5)
Cotinine: 57.8 ng/mL — ABNORMAL HIGH (ref <5.0)
Dihydrocodeine: NEGATIVE ng/mL (ref <2.5)
Fentanyl: NEGATIVE ng/mL (ref <0.10)
Heroin Metabolite: NEGATIVE ng/mL (ref <1.0)
Hydrocodone: NEGATIVE ng/mL (ref <2.5)
Hydromorphone: NEGATIVE ng/mL (ref <2.5)
MARIJUANA: NEGATIVE ng/mL (ref <2.5)
MDMA: NEGATIVE ng/mL (ref <10)
Meprobamate: NEGATIVE ng/mL (ref <2.5)
Methadone: NEGATIVE ng/mL (ref <5.0)
Morphine: NEGATIVE ng/mL (ref <2.5)
Nicotine Metabolite: POSITIVE ng/mL — AB (ref <5.0)
Norhydrocodone: NEGATIVE ng/mL (ref <2.5)
Noroxycodone: 8.1 ng/mL — ABNORMAL HIGH (ref <2.5)
Opiates: POSITIVE ng/mL — AB (ref <2.5)
Oxycodone: 81.6 ng/mL — ABNORMAL HIGH (ref <2.5)
Oxymorphone: NEGATIVE ng/mL (ref <2.5)
Phencyclidine: NEGATIVE ng/mL (ref <10)
Tapentadol: NEGATIVE ng/mL (ref <5.0)
Tramadol: NEGATIVE ng/mL (ref <5.0)
Zolpidem: NEGATIVE ng/mL (ref <5.0)

## 2024-03-30 LAB — DRUG TOX ALC METAB W/CON, ORAL FLD: Alcohol Metabolite: NEGATIVE ng/mL (ref <25)

## 2024-03-31 ENCOUNTER — Ambulatory Visit: Payer: 59 | Admitting: Physical Medicine and Rehabilitation

## 2024-04-01 ENCOUNTER — Telehealth: Payer: Self-pay | Admitting: Dietician

## 2024-04-01 ENCOUNTER — Ambulatory Visit: Admitting: Allergy & Immunology

## 2024-04-01 ENCOUNTER — Ambulatory Visit

## 2024-04-01 ENCOUNTER — Other Ambulatory Visit: Payer: Self-pay

## 2024-04-01 ENCOUNTER — Encounter (HOSPITAL_COMMUNITY): Payer: Self-pay

## 2024-04-01 VITALS — BP 104/72 | HR 94 | Temp 97.7°F | Wt 285.3 lb

## 2024-04-01 DIAGNOSIS — J4550 Severe persistent asthma, uncomplicated: Secondary | ICD-10-CM

## 2024-04-01 DIAGNOSIS — E669 Obesity, unspecified: Secondary | ICD-10-CM

## 2024-04-01 DIAGNOSIS — L732 Hidradenitis suppurativa: Secondary | ICD-10-CM

## 2024-04-01 DIAGNOSIS — H9202 Otalgia, left ear: Secondary | ICD-10-CM

## 2024-04-01 MED ORDER — COSENTYX UNOREADY 300 MG/2ML ~~LOC~~ SOAJ: 300.0000 mg | SUBCUTANEOUS | 3 refills | Status: DC

## 2024-04-01 MED ORDER — TRELEGY ELLIPTA 200-62.5-25 MCG/ACT IN AEPB: 1.0000 | INHALATION_SPRAY | Freq: Every day | RESPIRATORY_TRACT | 11 refills | Status: DC

## 2024-04-01 MED ORDER — PREDNISONE 10 MG PO TABS: 10.0000 mg | ORAL_TABLET | Freq: Two times a day (BID) | ORAL | 0 refills | Status: AC

## 2024-04-01 MED ORDER — PREDNISONE 10 MG PO TABS: 10.0000 mg | ORAL_TABLET | Freq: Two times a day (BID) | ORAL | 0 refills | Status: DC

## 2024-04-01 MED ORDER — EPINEPHRINE 0.3 MG/0.3ML IJ SOAJ: 0.3000 mg | INTRAMUSCULAR | 2 refills | Status: AC | PRN

## 2024-04-01 NOTE — Patient Instructions (Addendum)
 1. Moderate persistent asthma, uncomplicated - Lung testing looked worse today. - Continue with the Tezspire  since this is working so well.  - Daily controller medication(s): Trelegy 200/62.5/25 one puff once daily and Tezspire  every month - Prior to physical activity: albuterol  2 puffs 10-15 minutes before physical activity. - Rescue medications: albuterol  4 puffs every 4-6 hours as needed - Asthma control goals:  * Full participation in all desired activities (may need albuterol  before activity) * Albuterol  use two time or less a week on average (not counting use with activity) * Cough interfering with sleep two time or less a month * Oral steroids no more than once a year * No hospitalizations  2. Anaphylactic shock due to food (shellfish) - EpiPen  refilled.    3. Perennial and seasonal allergic rhinitis (grasses, weeds, ragweed, trees, molds, cat, dog, cockroach, and dust mite) - Continue with fluticasone  nasal spray two sprays per nostril daily EVERY DAY (brown bottle).  - Continue with levocetirizine 5mg  tablet once daily.  - Continue montelukast  (Singulair ) 10mg  daily.   - START PREDNISONE  10mg  TWICE DAILY FOR 7 DAYS.  4. Suppurativa hiadrenitis - Continue following with Dr. Annette Barters. - I will send my note to her to keep her in the loop.  - Take pics so you can show her when you see her.  5. Return in about 6 months (around 10/02/2024). You can have the follow up appointment with Dr. Idolina Maker or a Nurse Practicioner (our Nurse Practitioners are excellent and always have Physician oversight!).    Please inform us  of any Emergency Department visits, hospitalizations, or changes in symptoms. Call us  before going to the ED for breathing or allergy symptoms since we might be able to fit you in for a sick visit. Feel free to contact us  anytime with any questions, problems, or concerns.  It was a pleasure to see you again today!  Websites that have reliable patient information: 1.  American Academy of Asthma, Allergy, and Immunology: www.aaaai.org 2. Food Allergy Research and Education (FARE): foodallergy.org 3. Mothers of Asthmatics: http://www.asthmacommunitynetwork.org 4. American College of Allergy, Asthma, and Immunology: www.acaai.org      "Like" us  on Facebook and Instagram for our latest updates!      A healthy democracy works best when [employer] participate! Make sure you are registered to vote! If you have moved or changed any of your contact information, you will need to get this updated before voting! Scan the QR codes below to learn more!

## 2024-04-01 NOTE — Progress Notes (Unsigned)
 FOLLOW UP  Date of Service/Encounter:  04/01/24   Assessment:   Seasonal and perennial allergic rhinitis (grasses, weeds, ragweed, trees, molds, cat, dog, cockroach, and dust mite)   Moderate persistent asthma, uncomplicated  Allergic    Anaphylactic shock due to food (shellfish)   Hidradenitis suppurativa   Plan/Recommendations:   Patient Instructions  1. Moderate persistent asthma, uncomplicated - Lung testing looked worse today. - Continue with the Tezspire  since this is working so well.  - Daily controller medication(s): Trelegy 200/62.5/25 one puff once daily and Tezspire  every month - Prior to physical activity: albuterol  2 puffs 10-15 minutes before physical activity. - Rescue medications: albuterol  4 puffs every 4-6 hours as needed - Asthma control goals:  * Full participation in all desired activities (may need albuterol  before activity) * Albuterol  use two time or less a week on average (not counting use with activity) * Cough interfering with sleep two time or less a month * Oral steroids no more than once a year * No hospitalizations  2. Anaphylactic shock due to food (shellfish) - EpiPen  refilled.    3. Perennial and seasonal allergic rhinitis (grasses, weeds, ragweed, trees, molds, cat, dog, cockroach, and dust mite) - Continue with fluticasone  nasal spray two sprays per nostril daily EVERY DAY (brown bottle).  - Continue with levocetirizine 5mg  tablet once daily.  - Continue montelukast  (Singulair ) 10mg  daily.   - START PREDNISONE  10mg  TWICE DAILY FOR 7 DAYS.  4. Suppurativa hiadrenitis - Continue following with Dr. Annette Barters. - I will send my note to her to keep her in the loop.  - Take pics so you can show her when you see her.  5. Return in about 6 months (around 10/02/2024). You can have the follow up appointment with Dr. Idolina Maker or a Nurse Practicioner (our Nurse Practitioners are excellent and always have Physician oversight!).    Please inform  us  of any Emergency Department visits, hospitalizations, or changes in symptoms. Call us  before going to the ED for breathing or allergy symptoms since we might be able to fit you in for a sick visit. Feel free to contact us  anytime with any questions, problems, or concerns.  It was a pleasure to see you again today!  Websites that have reliable patient information: 1. American Academy of Asthma, Allergy, and Immunology: www.aaaai.org 2. Food Allergy Research and Education (FARE): foodallergy.org 3. Mothers of Asthmatics: http://www.asthmacommunitynetwork.org 4. American College of Allergy, Asthma, and Immunology: www.acaai.org      "Like" us  on Facebook and Instagram for our latest updates!      A healthy democracy works best when [employer] participate! Make sure you are registered to vote! If you have moved or changed any of your contact information, you will need to get this updated before voting! Scan the QR codes below to learn more!             Subjective:   [name] is a 48 y.o. female presenting today for follow up of  Chief Complaint  Patient presents with   Asthma   Follow-up    [name] has a history of the following: Patient Active Problem List   Diagnosis Date Noted   Abrasion of abdominal wall 04/02/2024   Encounter for general adult medical examination with abnormal findings 04/02/2024   Diabetes mellitus screening 04/02/2024   Encounter for lipid screening for cardiovascular disease 04/02/2024   Vitamin B12 deficiency 12/15/2023   Constipation 10/31/2023   Acute left ankle pain 10/31/2023  Numbness and tingling of right leg 10/05/2023   Rash 08/01/2023   Abdominal pain 06/06/2023   Iron deficiency 06/06/2023   Murmur 06/06/2023   Fibroids 05/13/2023   Abnormal uterine bleeding 04/25/2023   Chronic pain of left knee 03/31/2023   Class 3 severe obesity due to excess calories with body mass index (BMI) of 45.0 to 49.9 in adult  03/21/2023   Hidradenitis suppurativa 03/21/2023   Right lumbar radiculopathy 03/03/2023   Morbid obesity (HCC) 03/03/2023   Chronic pain syndrome 03/03/2023   Encounter for long-term opiate analgesic use 01/29/2023   Primary osteoarthritis of right hip 01/21/2023   Graves disease 07/06/2019   Hyperthyroidism 07/06/2019   Tobacco use disorder 05/26/2019   Perennial and seasonal allergic rhinitis 08/19/2017   Moderate persistent asthma without complication 08/19/2017    History obtained from: chart review and {Persons; PED relatives w/patient:19415::"patient"}.  Discussed the use of AI scribe software for clinical note transcription with the patient and/or guardian, who gave verbal consent to proceed.  [name] is a 47 y.o. female presenting for {Blank single:19197::"a food challenge","a drug challenge","skin testing","a sick visit","an evaluation of ***","a follow up visit"}.  She was last seen in October 2024.  At that time, we started a prednisone  taper and continue Trelegy 1 puff once daily as well as albuterol  as needed.  For her perennial seasonal allergic rhinitis, we will continue with Flonase  as well as levocetirizine and montelukast .  She continue to avoid shellfish.  EpiPen  was refilled.  She continue to follow with Dr. Annette Barters for her HA.  Since the last visit,  Asthma/Respiratory Symptom History: ***  Allergic Rhinitis Symptom History: ***  Food Allergy Symptom History: ***  Skin Symptom History: ***  GERD Symptom History: ***  Infection Symptom History: ***  Otherwise, there have been no changes to her past medical history, surgical history, family history, or social history.    Review of systems otherwise negative other than that mentioned in the HPI.    Objective:   Blood pressure 104/72, pulse 94, temperature 97.7 F (36.5 C), temperature source Temporal, weight 285 lb 4.8 oz (129.4 kg), last menstrual period 02/19/2024, SpO2 95%. Body mass index is 43.38  kg/m.    Physical Exam   Diagnostic studies: {Blank single:19197::"none","deferred due to recent antihistamine use","deferred due to insurance stipulations that require a separate visit for testing","labs sent instead"," "}  Spirometry: {Blank single:19197::"results normal (FEV1: ***%, FVC: ***%, FEV1/FVC: ***%)","results abnormal (FEV1: ***%, FVC: ***%, FEV1/FVC: ***%)"}.    {Blank single:19197::"Spirometry consistent with mild obstructive disease","Spirometry consistent with moderate obstructive disease","Spirometry consistent with severe obstructive disease","Spirometry consistent with possible restrictive disease","Spirometry consistent with mixed obstructive and restrictive disease","Spirometry uninterpretable due to technique","Spirometry consistent with normal pattern"}. {Blank single:19197::"Albuterol /Atrovent  nebulizer","Xopenex/Atrovent  nebulizer","Albuterol  nebulizer","Albuterol  four puffs via MDI","Xopenex four puffs via MDI"} treatment given in clinic with {Blank single:19197::"significant improvement in FEV1 per ATS criteria","significant improvement in FVC per ATS criteria","significant improvement in FEV1 and FVC per ATS criteria","improvement in FEV1, but not significant per ATS criteria","improvement in FVC, but not significant per ATS criteria","improvement in FEV1 and FVC, but not significant per ATS criteria","no improvement"}.  Allergy Studies: {Blank single:19197::"none","deferred due to recent antihistamine use","deferred due to insurance stipulations that require a separate visit for testing","labs sent instead"," "}    {Blank single:19197::"Allergy testing results were read and interpreted by myself, documented by clinical staff."," "}      Drexel Gentles, MD  [facility] 

## 2024-04-01 NOTE — Telephone Encounter (Signed)
 RD called pt to verify fluid intake once starting soft, solid proteins 2 week post-bariatric surgery.   Daily Fluid intake: 48 oz Daily Protein intake: 60-70 Bowel Habits: daily  Concerns/issues: pt states she is doing really

## 2024-04-01 NOTE — Progress Notes (Signed)
 Refill request from [facility] fax. [email or web address]

## 2024-04-02 ENCOUNTER — Ambulatory Visit (INDEPENDENT_AMBULATORY_CARE_PROVIDER_SITE_OTHER): Payer: 59 | Admitting: Nurse Practitioner

## 2024-04-02 VITALS — BP 128/70 | HR 84 | Temp 98.4°F | Ht 68.0 in | Wt 283.5 lb

## 2024-04-02 DIAGNOSIS — E538 Deficiency of other specified B group vitamins: Secondary | ICD-10-CM

## 2024-04-02 DIAGNOSIS — S30811D Abrasion of abdominal wall, subsequent encounter: Secondary | ICD-10-CM

## 2024-04-02 DIAGNOSIS — E0590 Thyrotoxicosis, unspecified without thyrotoxic crisis or storm: Secondary | ICD-10-CM | POA: Diagnosis not present

## 2024-04-02 DIAGNOSIS — Z13220 Encounter for screening for lipoid disorders: Secondary | ICD-10-CM | POA: Insufficient documentation

## 2024-04-02 DIAGNOSIS — E66813 Obesity, class 3: Secondary | ICD-10-CM | POA: Diagnosis not present

## 2024-04-02 DIAGNOSIS — Z131 Encounter for screening for diabetes mellitus: Secondary | ICD-10-CM | POA: Diagnosis not present

## 2024-04-02 DIAGNOSIS — Z6841 Body Mass Index (BMI) 40.0 and over, adult: Secondary | ICD-10-CM

## 2024-04-02 DIAGNOSIS — Z0001 Encounter for general adult medical examination with abnormal findings: Secondary | ICD-10-CM | POA: Insufficient documentation

## 2024-04-02 DIAGNOSIS — S30811A Abrasion of abdominal wall, initial encounter: Secondary | ICD-10-CM | POA: Insufficient documentation

## 2024-04-02 DIAGNOSIS — Z136 Encounter for screening for cardiovascular disorders: Secondary | ICD-10-CM

## 2024-04-02 LAB — COMPREHENSIVE METABOLIC PANEL WITH GFR
ALT: 21 U/L (ref 0–35)
AST: 20 U/L (ref 0–37)
Albumin: 3.8 g/dL (ref 3.5–5.2)
Alkaline Phosphatase: 76 U/L (ref 39–117)
BUN: 11 mg/dL (ref 6–23)
CO2: 26 meq/L (ref 19–32)
Calcium: 9.6 mg/dL (ref 8.4–10.5)
Chloride: 101 meq/L (ref 96–112)
Creatinine, Ser: 0.57 mg/dL (ref 0.40–1.20)
GFR: 108.23 mL/min (ref >60.00)
Glucose, Bld: 105 mg/dL — ABNORMAL HIGH (ref 70–99)
Potassium: 3.6 meq/L (ref 3.5–5.1)
Sodium: 138 meq/L (ref 135–145)
Total Bilirubin: 0.4 mg/dL (ref 0.2–1.2)
Total Protein: 8.9 g/dL — ABNORMAL HIGH (ref 6.0–8.3)

## 2024-04-02 LAB — VITAMIN B12: Vitamin B-12: >1500 pg/mL — ABNORMAL HIGH (ref 211–911)

## 2024-04-02 LAB — LIPID PANEL
Cholesterol: 178 mg/dL (ref 0–200)
HDL: 45 mg/dL (ref >39.00)
LDL Cholesterol: 120 mg/dL — ABNORMAL HIGH (ref 0–99)
NonHDL: 132.57
Total CHOL/HDL Ratio: 4
Triglycerides: 64 mg/dL (ref 0.0–149.0)
VLDL: 12.8 mg/dL (ref 0.0–40.0)

## 2024-04-02 LAB — CBC
HCT: 42.1 % (ref 36.0–46.0)
Hemoglobin: 13.5 g/dL (ref 12.0–15.0)
MCHC: 32 g/dL (ref 30.0–36.0)
MCV: 86.6 fl (ref 78.0–100.0)
Platelets: 200 10*3/uL (ref 150.0–400.0)
RBC: 4.87 Mil/uL (ref 3.87–5.11)
RDW: 16 % — ABNORMAL HIGH (ref 11.5–15.5)
WBC: 16.2 10*3/uL — ABNORMAL HIGH (ref 4.0–10.5)

## 2024-04-02 LAB — TSH: TSH: 0.8 u[IU]/mL (ref 0.35–5.50)

## 2024-04-02 LAB — HEMOGLOBIN A1C: Hgb A1c MFr Bld: 6.1 % (ref 4.6–6.5)

## 2024-04-02 MED ORDER — MUPIROCIN 2 % EX OINT: 1.0000 | TOPICAL_OINTMENT | Freq: Two times a day (BID) | CUTANEOUS | 0 refills | Status: AC

## 2024-04-02 NOTE — Assessment & Plan Note (Signed)
 Labs ordered, further recommendations may be made based upon these results

## 2024-04-02 NOTE — Assessment & Plan Note (Signed)
 Chronic Administer vitamin B12 IM injection today Check serum level at lab draw

## 2024-04-02 NOTE — Assessment & Plan Note (Signed)
 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

## 2024-04-02 NOTE — Assessment & Plan Note (Signed)
 Chronic Labs ordered, further recommendations may be made based upon his results.

## 2024-04-02 NOTE — Progress Notes (Signed)
 Complete physical exam  Patient: [name]   DOB: [birth year]   48 y.o. Female  MRN: [id number]  Subjective:     No chief complaint on file.   [name] is a 48 y.o. female who presents today for a complete physical exam.  Health maintenance: Mammogram scheduled later this month, up-to-date with Pap smear, up-to-date with colonoscopy, had tetanus vaccine administered last month.  Has completed hep C screening, STI screening.  Due for labs today. Recently underwent abdominal surgery (gastric sleeve), she is recovering quite well.  One of her incisions was accidentally scratched yesterday and is causing her some mild discomfort.  No drainage or redness.  Most recent fall risk assessment:    03/25/2024    8:54 AM  Fall Risk   Falls in the past year? 0  Number falls in past yr: 0  Injury with Fall? 0     Most recent depression screenings:    03/25/2024    8:54 AM 02/20/2024    9:38 AM  PHQ 2/9 Scores  PHQ - 2 Score 0 0  PHQ- 9 Score  0      Past Medical History:  Diagnosis Date   Arthritis    Asthma    Diverticulitis of colon    GERD (gastroesophageal reflux disease)    Heart murmur    Hydradenitis    Iron (Fe) deficiency anemia    MRSA (methicillin resistant staph aureus) culture positive    Obesity    Past Surgical History:  Procedure Laterality Date   CESAREAN SECTION     x2   HIATAL HERNIA REPAIR N/A 03/09/2024   Procedure: REPAIR, HERNIA, HIATAL;  Surgeon: Adalberto Acton, MD;  Location: [facility];  Service: General;  Laterality: N/A;   LAPAROSCOPIC GASTRIC SLEEVE RESECTION N/A 03/09/2024   Procedure: GASTRECTOMY, SLEEVE, LAPAROSCOPIC;  Surgeon: Adalberto Acton, MD;  Location: [facility];  Service: General;  Laterality: N/A;   TUBAL LIGATION     UPPER GI ENDOSCOPY N/A 03/09/2024   Procedure: ENDOSCOPY, UPPER GI TRACT;  Surgeon: Adalberto Acton, MD;  Location: [facility];  Service: General;  Laterality: N/A;   WISDOM TOOTH EXTRACTION  2011   Social  History   Socioeconomic History   Marital status: Single    Spouse name: Not on file   Number of children: Not on file   Years of education: Not on file   Highest education level: 11th grade  Occupational History   Occupation: disabled  Tobacco Use   Smoking status: Former    Current packs/day: 0.33    Types: Cigarettes    Passive exposure: Current   Smokeless tobacco: Never  Vaping Use   Vaping status: Never Used  Substance and Sexual Activity   Alcohol use: Not Currently   Drug use: No   Sexual activity: Yes    Birth control/protection: None  Other Topics Concern   Not on file  Social History Narrative   Lives with her daughter   Social Drivers of Health   Financial Resource Strain: Low Risk  (02/20/2024)   Overall Financial Resource Strain (CARDIA)    Difficulty of Paying Living Expenses: Not hard at all  Food Insecurity: No Food Insecurity (03/11/2024)   Hunger Vital Sign    Worried About Running Out of Food in the Last Year: Never true    Ran Out of Food in the Last Year: Never true  Transportation Needs: No Transportation Needs (03/11/2024)   PRAPARE - Transportation  Lack of Transportation (Medical): No    Lack of Transportation (Non-Medical): No  Physical Activity: Inactive (02/20/2024)   Exercise Vital Sign    Days of Exercise per Week: 0 days    Minutes of Exercise per Session: 0 min  Stress: Stress Concern Present (02/20/2024)   [employer] of Occupational Health - Occupational Stress Questionnaire    Feeling of Stress : To some extent  Social Connections: Moderately Isolated (03/09/2024)   Social Connection and Isolation Panel [NHANES]    Frequency of Communication with Friends and Family: More than three times a week    Frequency of Social Gatherings with Friends and Family: More than three times a week    Attends Religious Services: More than 4 times per year    Active Member of [employer] or Organizations: No    Attends Banker Meetings:  Never    Marital Status: Divorced  Catering manager Violence: Not At Risk (03/11/2024)   Humiliation, Afraid, Rape, and Kick questionnaire    Fear of Current or Ex-Partner: No    Emotionally Abused: No    Physically Abused: No    Sexually Abused: No   Family History  Problem Relation Age of Onset   Diabetes Mother    Hypertension Mother    Diabetes Father    Asthma Other    Hyperlipidemia Other    Hypertension Other    Colon cancer Neg Hx    Esophageal cancer Neg Hx    Rectal cancer Neg Hx    Stomach cancer Neg Hx    Allergies  Allergen Reactions   Shellfish-Derived Products Hives, Itching and Swelling   Minocycline  Other (See Comments)    Skin hyperpigmentation.  Can tolerate doxycycline  (it seems)  Other Reaction(s): Other (See Comments)  Skin hyperpigmentation. Can tolerate doxycycline  (it seems)      Patient Care Team: Zorita Hiss, NP as PCP - General (Nurse Practitioner)   Outpatient Medications Prior to Visit  Medication Sig   albuterol  (PROVENTIL ) (2.5 MG/3ML) 0.083% nebulizer solution Inhale one vial via nebulizer every 4 hours as needed for wheezing or shortness of breath   albuterol  (VENTOLIN  HFA) 108 (90 Base) MCG/ACT inhaler Inhale 2 puffs by mouth every 4 hours as needed for wheezing/shortness of breath   cromolyn  (OPTICROM ) 4 % ophthalmic solution Place 1 drop into both eyes 4 (four) times daily.   docusate sodium  (COLACE) 100 MG capsule Take 1 capsule (100 mg total) by mouth 2 (two) times daily. Okay to decrease to once daily or stop taking if having loose bowel movements   ELIDEL  1 % cream Apply to aa's atopic dermatitis QD-BID PRN flares. (Patient taking differently: Apply 1 Application topically 2 (two) times daily. Apply to aa's atopic dermatitis QD-BID PRN flares.)   ENSKYCE  0.15-30 MG-MCG tablet Take 1 tablet by mouth every day   EPINEPHrine  0.3 mg/0.3 mL IJ SOAJ injection Inject 0.3 mg into the muscle as needed for anaphylaxis.   ferrous sulfate   (FEROSUL) 325 (65 FE) MG tablet Take 1 tablet by mouth twice daily   Fluticasone -Umeclidin-Vilant (TRELEGY ELLIPTA ) 200-62.5-25 MCG/ACT AEPB Inhale 1 puff into the lungs daily.   mesalamine  (APRISO ) 0.375 g 24 hr capsule Take 4 capsules (1.5 g total) by mouth daily.   montelukast  (SINGULAIR ) 10 MG tablet TAKE 1 TABLET BY MOUTH AT  BEDTIME   ondansetron  (ZOFRAN -ODT) 4 MG disintegrating tablet Take 1 tablet (4 mg total) by mouth every 6 (six) hours as needed for nausea or vomiting.   oxyCODONE -acetaminophen  (  PERCOCET/ROXICET) 5-325 MG tablet Take 1-2 tablets by mouth 2 (two) times daily.   pantoprazole  (PROTONIX ) 40 MG tablet Take 1 tablet (40 mg total) by mouth daily. Take this medication daily, regardless of reflux symptoms   Plecanatide  (TRULANCE ) 3 MG TABS TAKE 1 TABLET BY MOUTH DAILY   predniSONE  (DELTASONE ) 10 MG tablet Take 1 tablet (10 mg total) by mouth 2 (two) times daily with a meal for 7 days.   Respiratory Therapy Supplies (NEBULIZER MASK ADULT/TUBING) MISC 1 each by Does not apply route daily.   Secukinumab  (COSENTYX  UNOREADY) 300 MG/2ML SOAJ Inject 300 mg into the skin every 28 (twenty-eight) days.   Spacer/Aero-Holding Chambers (AEROCHAMBER PLUS) inhaler Use as instructed   TEZSPIRE  210 MG/1.91ML syringe 210 mg.   Vitamin D , Ergocalciferol , (DRISDOL) 1.25 MG (50000 UNIT) CAPS capsule Take 1 capsule by mouth once a week   enoxaparin  (LOVENOX ) 40 MG/0.4ML injection Inject 0.4 mLs (40 mg total) into the skin every 12 (twelve) hours for 14 days.   gabapentin  (NEURONTIN ) 100 MG capsule Take 1 capsule (100 mg total) by mouth every 12 (twelve) hours for 5 days.   Facility-Administered Medications Prior to Visit  Medication Dose Route Frequency Provider   tezepelumab -ekko (TEZSPIRE ) 210 MG/1. syringe 210 mg  210 mg Subcutaneous Q28 days Rochester Chuck, MD    Review of Systems  Constitutional:  Positive for weight loss (intentional, recently underwent weight loss surgery).  Negative for chills and fever.  Eyes:  Negative for blurred vision and double vision.  Respiratory:  Negative for shortness of breath and wheezing.   Cardiovascular:  Negative for chest pain and palpitations.  Gastrointestinal:  Negative for abdominal pain and blood in stool.  Genitourinary:  Negative for hematuria.  Skin:  Negative for itching and rash.       (+) HS   Neurological:  Negative for seizures and loss of consciousness.  Psychiatric/Behavioral:  Negative for depression and suicidal ideas. The patient is not nervous/anxious.           Objective:     BP 128/70   Pulse 84   Temp 98.4 F (36.9 C) (Temporal)   Ht 5\' 8"  (1.727 m)   Wt 283 lb 8 oz (128.6 kg)   SpO2 98%   BMI 43.11 kg/m  BP Readings from Last 3 Encounters:  04/02/24 128/70  04/01/24 104/72  03/25/24 121/87   Wt Readings from Last 3 Encounters:  04/02/24 283 lb 8 oz (128.6 kg)  04/01/24 285 lb 4.8 oz (129.4 kg)  03/25/24 287 lb 6.4 oz (130.4 kg)      Physical Exam Vitals reviewed.  Constitutional:      Appearance: Normal appearance.  HENT:     Head: Normocephalic and atraumatic.     Right Ear: Tympanic membrane, ear canal and external ear normal.     Left Ear: Tympanic membrane, ear canal and external ear normal.  Eyes:     General:        Right eye: No discharge.        Left eye: No discharge.     Extraocular Movements: Extraocular movements intact.     Conjunctiva/sclera: Conjunctivae normal.     Pupils: Pupils are equal, round, and reactive to light.  Neck:     Vascular: No carotid bruit.  Cardiovascular:     Rate and Rhythm: Normal rate and regular rhythm.     Pulses: Normal pulses.     Heart sounds: Normal heart sounds. No murmur heard. Pulmonary:  Effort: Pulmonary effort is normal.     Breath sounds: Normal breath sounds.  Chest:     Comments: Breast exam deferred per patient preference Abdominal:     General: Abdomen is flat. Bowel sounds are normal. There is no  distension.     Palpations: Abdomen is soft. There is no mass.     Tenderness: There is no abdominal tenderness.  Musculoskeletal:        General: No tenderness.     Cervical back: Neck supple. No muscular tenderness.     Right lower leg: No edema.     Left lower leg: No edema.  Lymphadenopathy:     Cervical: No cervical adenopathy.     Upper Body:     Right upper body: No supraclavicular adenopathy.     Left upper body: No supraclavicular adenopathy.  Skin:    General: Skin is warm and dry.       Neurological:     General: No focal deficit present.     Mental Status: She is alert and oriented to person, place, and time.     Motor: No weakness.     Gait: Gait normal.  Psychiatric:        Mood and Affect: Mood normal.        Behavior: Behavior normal.        Judgment: Judgment normal.      No results found for any visits on 04/02/24.     Assessment & Plan:    Routine Health Maintenance and Physical Exam  Immunization History  Administered Date(s) Administered   Tdap 03/02/2024    Health Maintenance  Topic Date Due   COVID-19 Vaccine (1) Never done   Pneumococcal Vaccine 4-106 Years old (1 of 2 - PCV) Never done   INFLUENZA VACCINE  06/25/2024   Medicare Annual Wellness (AWV)  02/19/2025   Cervical Cancer Screening (HPV/Pap Cotest)  01/04/2028   Colonoscopy  01/25/2034   DTaP/Tdap/Td (2 - Td or Tdap) 03/02/2034   Hepatitis C Screening  Completed   HIV Screening  Completed   HPV VACCINES  Aged Out   Meningococcal B Vaccine  Aged Out    Discussed health benefits of physical activity, and encouraged her to engage in regular exercise appropriate for her age and condition.  Problem List Items Addressed This Visit       Endocrine   Hyperthyroidism   Chronic Labs ordered, further recommendations may be made based upon his results       Relevant Orders   CBC   Comprehensive metabolic panel with GFR   Hemoglobin A1c   Lipid panel   TSH   Vitamin B12      Musculoskeletal and Integument   Abrasion of abdominal wall   Acute, no evidence of current infection or bleeding, surgical site remains well approximated. Prescribe topical mupirocin  to be applied BID x 5 days and as needed thereafter.       Relevant Medications   mupirocin  ointment (BACTROBAN ) 2 %   Other Relevant Orders   CBC   Comprehensive metabolic panel with GFR   Hemoglobin A1c   Lipid panel   TSH   Vitamin B12     Other   Class 3 severe obesity due to excess calories with body mass index (BMI) of 45.0 to 49.9 in adult   Chronic Recovering well from gastric sleeve placement Patient is losing weight appropriately Labs ordered, further recommendations may be made based upon these results  Relevant Orders   CBC   Comprehensive metabolic panel with GFR   Hemoglobin A1c   Lipid panel   TSH   Vitamin B12   Vitamin B12 deficiency   Chronic Administer vitamin B12 IM injection today Check serum level at lab draw      Relevant Orders   CBC   Comprehensive metabolic panel with GFR   Hemoglobin A1c   Lipid panel   TSH   Vitamin B12   Encounter for general adult medical examination with abnormal findings - Primary   Discussed healthy lifestyle and healthcare maintenance/preventative screenings.  Handout provided.      Relevant Orders   CBC   Comprehensive metabolic panel with GFR   Hemoglobin A1c   Lipid panel   TSH   Vitamin B12   Diabetes mellitus screening   Labs ordered, further recommendations may be made based upon these results       Relevant Orders   Hemoglobin A1c   Encounter for lipid screening for cardiovascular disease   Labs ordered, further recommendations may be made based upon these results       Relevant Orders   Hemoglobin A1c   Lipid panel   Return in about 6 months (around 10/03/2024) for F/U with Swan Zayed.  In addition to annual physical exam I also performed an office visit as detailed above.     Zorita Hiss, NP

## 2024-04-02 NOTE — Assessment & Plan Note (Signed)
 Discussed healthy lifestyle and healthcare maintenance/preventative screenings.  Handout provided.

## 2024-04-02 NOTE — Assessment & Plan Note (Signed)
 Acute, no evidence of current infection or bleeding, surgical site remains well approximated. Prescribe topical mupirocin  to be applied BID x 5 days and as needed thereafter.

## 2024-04-05 ENCOUNTER — Other Ambulatory Visit (HOSPITAL_COMMUNITY): Payer: Self-pay

## 2024-04-05 ENCOUNTER — Encounter: Payer: Self-pay | Admitting: Nurse Practitioner

## 2024-04-05 ENCOUNTER — Other Ambulatory Visit: Payer: Self-pay | Admitting: Nurse Practitioner

## 2024-04-05 ENCOUNTER — Encounter: Payer: 59 | Admitting: Physical Medicine and Rehabilitation

## 2024-04-05 DIAGNOSIS — D72829 Elevated white blood cell count, unspecified: Secondary | ICD-10-CM

## 2024-04-05 DIAGNOSIS — R779 Abnormality of plasma protein, unspecified: Secondary | ICD-10-CM

## 2024-04-06 ENCOUNTER — Encounter: Payer: Self-pay | Admitting: Allergy & Immunology

## 2024-04-06 ENCOUNTER — Other Ambulatory Visit (HOSPITAL_COMMUNITY): Payer: Self-pay

## 2024-04-07 ENCOUNTER — Other Ambulatory Visit (HOSPITAL_COMMUNITY): Payer: Self-pay

## 2024-04-07 MED ORDER — CYANOCOBALAMIN 1000 MCG/ML IJ SOLN
1000.0000 ug | Freq: Once | INTRAMUSCULAR | Status: AC
Start: 2024-04-07 — End: 2024-04-02
  Administered 2024-04-02: 1000 ug via INTRAMUSCULAR

## 2024-04-07 NOTE — Addendum Note (Signed)
 Addended by: Arch Beans, Molli Gethers P on: 04/07/2024 11:06 AM   Modules accepted: Orders

## 2024-04-08 ENCOUNTER — Ambulatory Visit

## 2024-04-08 ENCOUNTER — Ambulatory Visit
Admission: RE | Admit: 2024-04-08 | Discharge: 2024-04-08 | Disposition: A | Discharge status: Home / Self Care | Source: Ambulatory Visit | Attending: Nurse Practitioner | Admitting: Nurse Practitioner

## 2024-04-08 DIAGNOSIS — Z1231 Encounter for screening mammogram for malignant neoplasm of breast: Secondary | ICD-10-CM

## 2024-04-09 ENCOUNTER — Ambulatory Visit: Payer: Self-pay

## 2024-04-14 ENCOUNTER — Ambulatory Visit: Payer: Self-pay | Admitting: Nurse Practitioner

## 2024-04-14 NOTE — Telephone Encounter (Signed)
 Made pt aware that she will not be needing it per her provider, she will need to be on the oral supplement 500mcg

## 2024-04-16 ENCOUNTER — Other Ambulatory Visit: Payer: Self-pay

## 2024-04-16 MED ORDER — AZELASTINE-FLUTICASONE 137-50 MCG/ACT NA SUSP
NASAL | 2 refills | Status: DC
Start: 2024-04-16 — End: 2024-10-15

## 2024-04-20 ENCOUNTER — Other Ambulatory Visit: Payer: Self-pay | Admitting: Allergy

## 2024-04-22 DIAGNOSIS — L732 Hidradenitis suppurativa: Secondary | ICD-10-CM | POA: Diagnosis not present

## 2024-04-22 DIAGNOSIS — Z79899 Other long term (current) drug therapy: Secondary | ICD-10-CM | POA: Diagnosis not present

## 2024-04-29 ENCOUNTER — Other Ambulatory Visit (INDEPENDENT_AMBULATORY_CARE_PROVIDER_SITE_OTHER)

## 2024-04-29 ENCOUNTER — Ambulatory Visit

## 2024-04-29 ENCOUNTER — Other Ambulatory Visit: Payer: Self-pay | Admitting: Allergy & Immunology

## 2024-04-29 DIAGNOSIS — R779 Abnormality of plasma protein, unspecified: Secondary | ICD-10-CM

## 2024-04-29 DIAGNOSIS — J4550 Severe persistent asthma, uncomplicated: Secondary | ICD-10-CM

## 2024-04-29 DIAGNOSIS — D72829 Elevated white blood cell count, unspecified: Secondary | ICD-10-CM | POA: Diagnosis not present

## 2024-04-29 LAB — CBC WITH DIFFERENTIAL/PLATELET
Basophils Absolute: 0 10*3/uL (ref 0.0–0.1)
Basophils Relative: 0.4 % (ref 0.0–3.0)
Eosinophils Absolute: 0.1 10*3/uL (ref 0.0–0.7)
Eosinophils Relative: 0.7 % (ref 0.0–5.0)
HCT: 36.4 % (ref 36.0–46.0)
Hemoglobin: 11.9 g/dL — ABNORMAL LOW (ref 12.0–15.0)
Lymphocytes Relative: 22.3 % (ref 12.0–46.0)
Lymphs Abs: 1.8 10*3/uL (ref 0.7–4.0)
MCHC: 32.8 g/dL (ref 30.0–36.0)
MCV: 85.8 fl (ref 78.0–100.0)
Monocytes Absolute: 0.5 10*3/uL (ref 0.1–1.0)
Monocytes Relative: 6 % (ref 3.0–12.0)
Neutro Abs: 5.5 10*3/uL (ref 1.4–7.7)
Neutrophils Relative %: 70.6 % (ref 43.0–77.0)
Platelets: 197 10*3/uL (ref 150.0–400.0)
RBC: 4.24 Mil/uL (ref 3.87–5.11)
RDW: 17.7 % — ABNORMAL HIGH (ref 11.5–15.5)
WBC: 7.8 10*3/uL (ref 4.0–10.5)

## 2024-04-29 LAB — COMPREHENSIVE METABOLIC PANEL WITH GFR
ALT: 19 U/L (ref 0–35)
AST: 18 U/L (ref 0–37)
Albumin: 3.5 g/dL (ref 3.5–5.2)
Alkaline Phosphatase: 67 U/L (ref 39–117)
BUN: 9 mg/dL (ref 6–23)
CO2: 23 meq/L (ref 19–32)
Calcium: 9.1 mg/dL (ref 8.4–10.5)
Chloride: 103 meq/L (ref 96–112)
Creatinine, Ser: 0.64 mg/dL (ref 0.40–1.20)
GFR: 105.2 mL/min (ref >60.00)
Glucose, Bld: 77 mg/dL (ref 70–99)
Potassium: 3.5 meq/L (ref 3.5–5.1)
Sodium: 136 meq/L (ref 135–145)
Total Bilirubin: 0.4 mg/dL (ref 0.2–1.2)
Total Protein: 7.8 g/dL (ref 6.0–8.3)

## 2024-05-01 ENCOUNTER — Other Ambulatory Visit: Payer: Self-pay | Admitting: Internal Medicine

## 2024-05-03 ENCOUNTER — Other Ambulatory Visit: Payer: Self-pay | Admitting: Internal Medicine

## 2024-05-03 NOTE — Progress Notes (Unsigned)
 Subjective:    Patient ID: [name], female    DOB: [birth year], 48 y.o.   MRN: [id number]      HPI [name] is here for No chief complaint on file.   ?  Bariatric surgery/15  Rectal bleeding -   Colonoscopy done 01/2023-diverticula sigmoid, descending colon and ascending colon.  Sigmoid colon with mucosal changes-erythema and loss of vascularity.  Biopsy showed chronic, focally active colitis.  She was placed on Lialda , which was switched to Apriso .    Medications and allergies reviewed with patient and updated if appropriate.  Current Outpatient Medications on File Prior to Visit  Medication Sig Dispense Refill   Azelastine -Fluticasone  137-50 MCG/ACT SUSP 2 sprays into each nostril twice daily 23 g 2   albuterol  (PROVENTIL ) (2.5 MG/3ML) 0.083% nebulizer solution Inhale 1 vial via nebulizer every 4 hours as needed for wheezing/shortness of breath 75 mL 5   albuterol  (VENTOLIN  HFA) 108 (90 Base) MCG/ACT inhaler Inhale 2 puffs by mouth every 4 hours as needed for wheezing/shortness of breath 8.5 each 11   cromolyn  (OPTICROM ) 4 % ophthalmic solution Place 1 drop into both eyes 4 (four) times daily. 10 mL 5   ELIDEL  1 % cream Apply to aa's atopic dermatitis QD-BID PRN flares. (Patient taking differently: Apply 1 Application topically 2 (two) times daily. Apply to aa's atopic dermatitis QD-BID PRN flares.) 60 g 3   enoxaparin  (LOVENOX ) 40 MG/0.4ML injection Inject 0.4 mLs (40 mg total) into the skin every 12 (twelve) hours for 14 days. 11.2 mL 0   ENSKYCE  0.15-30 MG-MCG tablet Take 1 tablet by mouth every day 84 tablet 11   EPINEPHrine  0.3 mg/0.3 mL IJ SOAJ injection Inject 0.3 mg into the muscle as needed for anaphylaxis. 2 mL 2   ferrous sulfate  (FEROSUL) 325 (65 FE) MG tablet Take 1 tablet by mouth twice daily 60 tablet 0   fluticasone  (FLONASE ) 50 MCG/ACT nasal spray Use 1 spray into each nostril every day 16 g 11   Fluticasone -Umeclidin-Vilant (TRELEGY ELLIPTA ) 200-62.5-25  MCG/ACT AEPB Inhale 1 puff into the lungs daily. 60 each 11   gabapentin  (NEURONTIN ) 100 MG capsule Take 1 capsule (100 mg total) by mouth every 12 (twelve) hours for 5 days. 10 capsule 0   mesalamine  (APRISO ) 0.375 g 24 hr capsule TAKE 4 CAPSULES BY MOUTH DAILY. 360 capsule 1   montelukast  (SINGULAIR ) 10 MG tablet TAKE 1 TABLET BY MOUTH AT  BEDTIME 100 tablet 4   mupirocin  ointment (BACTROBAN ) 2 % Apply 1 Application topically 2 (two) times daily. 22 g 0   ondansetron  (ZOFRAN -ODT) 4 MG disintegrating tablet Take 1 tablet (4 mg total) by mouth every 6 (six) hours as needed for nausea or vomiting. 20 tablet 0   oxyCODONE -acetaminophen  (PERCOCET/ROXICET) 5-325 MG tablet Take 1-2 tablets by mouth 2 (two) times daily. 120 tablet 0   pantoprazole  (PROTONIX ) 40 MG tablet Take 1 tablet (40 mg total) by mouth daily. Take this medication daily, regardless of reflux symptoms 90 tablet 0   Plecanatide  (TRULANCE ) 3 MG TABS TAKE 1 TABLET BY MOUTH DAILY 30 tablet 3   Respiratory Therapy Supplies (NEBULIZER MASK ADULT/TUBING) MISC 1 each by Does not apply route daily. 1 each 1   Secukinumab  (COSENTYX  UNOREADY) 300 MG/2ML SOAJ Inject 300 mg into the skin every 28 (twenty-eight) days. 2 mL 3   Spacer/Aero-Holding Chambers (AEROCHAMBER PLUS) inhaler Use as instructed 1 each 2   TEZSPIRE  210 MG/1. syringe 210 mg.     Vitamin D ,  Ergocalciferol , (DRISDOL) 1.25 MG (50000 UNIT) CAPS capsule Take 1 capsule by mouth once a week 4 capsule 0   Current Facility-Administered Medications on File Prior to Visit  Medication Dose Route Frequency Provider Last Rate Last Admin   tezepelumab -ekko (TEZSPIRE ) 210 MG/1. syringe 210 mg  210 mg Subcutaneous Q28 days Rochester Chuck, MD   210 mg at 04/29/24 1612    Review of Systems     Objective:  There were no vitals filed for this visit. BP Readings from Last 3 Encounters:  04/02/24 128/70  04/01/24 104/72  03/25/24 121/87   Wt Readings from Last 3 Encounters:   04/02/24 283 lb 8 oz (128.6 kg)  04/01/24 285 lb 4.8 oz (129.4 kg)  03/25/24 287 lb 6.4 oz (130.4 kg)   There is no height or weight on file to calculate BMI.    Physical Exam         Assessment & Plan:    See Problem List for Assessment and Plan of chronic medical problems.

## 2024-05-04 ENCOUNTER — Encounter: Payer: Self-pay | Admitting: Internal Medicine

## 2024-05-04 ENCOUNTER — Ambulatory Visit (INDEPENDENT_AMBULATORY_CARE_PROVIDER_SITE_OTHER): Admitting: Internal Medicine

## 2024-05-04 ENCOUNTER — Encounter: Payer: Self-pay | Admitting: Family Medicine

## 2024-05-04 ENCOUNTER — Other Ambulatory Visit: Payer: Self-pay

## 2024-05-04 ENCOUNTER — Ambulatory Visit: Admitting: Family Medicine

## 2024-05-04 VITALS — BP 124/80 | HR 63 | Temp 98.4°F | Ht 68.0 in | Wt 280.0 lb

## 2024-05-04 VITALS — BP 124/80 | HR 75 | Ht 68.0 in | Wt 282.0 lb

## 2024-05-04 DIAGNOSIS — G8929 Other chronic pain: Secondary | ICD-10-CM | POA: Diagnosis not present

## 2024-05-04 DIAGNOSIS — M25551 Pain in right hip: Secondary | ICD-10-CM | POA: Diagnosis not present

## 2024-05-04 DIAGNOSIS — M25562 Pain in left knee: Secondary | ICD-10-CM | POA: Diagnosis not present

## 2024-05-04 DIAGNOSIS — K625 Hemorrhage of anus and rectum: Secondary | ICD-10-CM

## 2024-05-04 DIAGNOSIS — M1712 Unilateral primary osteoarthritis, left knee: Secondary | ICD-10-CM

## 2024-05-04 MED ORDER — DOCUSATE SODIUM 100 MG PO CAPS: 100.0000 mg | ORAL_CAPSULE | Freq: Every day | ORAL | 3 refills | Status: DC | PRN

## 2024-05-04 MED ORDER — TRIAMCINOLONE ACETONIDE 32 MG IX SRER
32.0000 mg | Freq: Once | INTRA_ARTICULAR | Status: AC
Start: 2024-05-04 — End: 2024-05-04
  Administered 2024-05-04: 32 mg via INTRA_ARTICULAR

## 2024-05-04 NOTE — Patient Instructions (Addendum)
 Thank you for coming in today.   You received an injection today. Seek immediate medical attention if the joint becomes red, extremely painful, or is oozing fluid.   We've placed a referral to Dr. Lucienne Ryder with [facility] for a consult for right hip surgery.   See you back as needed.

## 2024-05-04 NOTE — Progress Notes (Signed)
 I, Miquel Amen, CMA acting as a scribe for Garlan Juniper, MD.  [name] is a 48 y.o. female who presents to [facility] today for exacerbation of her L knee pain. Pt was last seen by Dr. Alease Hunter on 12/09/23 and was given a repeat L knee Zilretta  injection.  Today, pt reports exacerbation of left knee sx over the past 2 weeks. Got good relief with last Zilretta  injection. Mechanical sx present. Denies visible swelling. Notes n/t in the peripatellar region of the knee. Ambulating with a cane today. Also notes increased right hip pain s/p Gastric Sleeve surgery.   Dx imaging: 04/20/23 L knee MRI 03/25/23 L knee XR  Pertinent review of systems: No fevers or chills  Relevant historical information: 6 weeks status post gastric sleeve.  Doing quite well with lots of weight loss. Right hip arthritis.   Exam:  BP 124/80   Pulse 75   Ht 5\' 8"  (1.727 m)   Wt 282 lb (127.9 kg)   LMP 02/04/2024   SpO2 99%   BMI 42.88 kg/m  General: Well Developed, well nourished, and in no acute distress.   MSK: Left knee mild effusion normal-appearing otherwise normal motion.    Lab and Radiology Results   Zilretta  injection left knee Procedure: Real-time Ultrasound Guided Injection of left knee joint superior lateral patellar space Device: Philips Affiniti 50G Images permanently stored and available for review in PACS Verbal informed consent obtained.  Discussed risks and benefits of procedure. Warned about infection, hyperglycemia bleeding, damage to structures among others. Patient expresses understanding and agreement Time-out conducted.   Noted no overlying erythema, induration, or other signs of local infection.   Skin prepped in a sterile fashion.   Local anesthesia: Topical Ethyl chloride.   With sterile technique and under real time ultrasound guidance: Zilretta  32 mg injected into knee joint. Fluid seen entering the joint capsule.   Completed without  difficulty   Advised to call if fevers/chills, erythema, induration, drainage, or persistent bleeding.   Images permanently stored and available for review in the ultrasound unit.  Impression: Technically successful ultrasound guided injection.    Lot number: [id number]     Assessment and Plan: 48 y.o. female with left knee pain due to DJD.  Plan for Zilretta  injection today.  Can repeat this in 3 months if needed.  Additionally she has significant right hip pain due to DJD.  She is in the process of losing weight after having a gastric sleeve with BMI currently 42.8.  I think it is reasonable to refer her back to orthopedic surgery to talk total hip replacement in the near future.  I expect she will continue to lose weight pretty quickly and have her BMI under 40 pretty soon.   PDMP not reviewed this encounter. Orders Placed This Encounter  Procedures   US  LIMITED JOINT SPACE STRUCTURES LOW LEFT(NO LINKED CHARGES)    Reason for Exam (SYMPTOM  OR DIAGNOSIS REQUIRED):   left knee pain / OA    Preferred imaging location?:   [facility] referral to Orthopedic Surgery    Referral Priority:   Routine    Referral Type:   Surgical    Referral Reason:   Specialty Services Required    Referred to Provider:   Arnie Lao, MD    Requested Specialty:   Orthopedic Surgery    Number of Visits Requested:   1   Meds ordered this encounter  Medications   Triamcinolone  Acetonide (ZILRETTA ) intra-articular injection 32 mg     Discussed warning signs or symptoms. Please see discharge instructions. Patient expresses understanding.   The above documentation has been reviewed and is accurate and complete Garlan Juniper, M.D.

## 2024-05-04 NOTE — Patient Instructions (Addendum)
         Medications changes include :   start docusate 100 mg twice daily as needed      Return if symptoms worsen or fail to improve.

## 2024-05-05 ENCOUNTER — Encounter: Payer: Self-pay | Admitting: *Deleted

## 2024-05-11 ENCOUNTER — Ambulatory Visit (HOSPITAL_COMMUNITY)
Admission: EM | Admit: 2024-05-11 | Discharge: 2024-05-11 | Disposition: A | Discharge status: Home / Self Care | Attending: Emergency Medicine | Admitting: Emergency Medicine

## 2024-05-11 ENCOUNTER — Encounter (HOSPITAL_COMMUNITY): Payer: Self-pay

## 2024-05-11 DIAGNOSIS — N939 Abnormal uterine and vaginal bleeding, unspecified: Secondary | ICD-10-CM | POA: Diagnosis present

## 2024-05-11 LAB — CBC
HCT: 41.3 % (ref 36.0–46.0)
Hemoglobin: 13.4 g/dL (ref 12.0–15.0)
MCH: 28.9 pg (ref 26.0–34.0)
MCHC: 32.4 g/dL (ref 30.0–36.0)
MCV: 89.2 fL (ref 80.0–100.0)
Platelets: 212 10*3/uL (ref 150–400)
RBC: 4.63 MIL/uL (ref 3.87–5.11)
RDW: 17.6 % — ABNORMAL HIGH (ref 11.5–15.5)
WBC: 15.5 10*3/uL — ABNORMAL HIGH (ref 4.0–10.5)
nRBC: 0 % (ref 0.0–0.2)

## 2024-05-11 MED ORDER — MEDROXYPROGESTERONE ACETATE 10 MG PO TABS: 10.0000 mg | ORAL_TABLET | Freq: Every day | ORAL | 0 refills | Status: DC

## 2024-05-11 NOTE — Discharge Instructions (Signed)
 Start taking 1 tablet of Provera  once daily for 10 days to assist with your bleeding. Stop taking your birth control while you are taking this. Follow-up with your OB/GYN as soon as possible for further evaluation and management of this. If your bleeding becomes more severe and you are going through a pad or more in 1 hour, you develop severe abdominal cramping, excessive vomiting, or fever please seek immediate medical treatment in the emergency department. We have drawn some blood work today to evaluate your blood counts and we will call if these results are concerning and require any additional treatment. Return here as needed.

## 2024-05-11 NOTE — ED Triage Notes (Signed)
 Pt states vaginal bleeding for the past 30 days.  States she was on birth control pills for a uterine fibroid but had to go off of them for surgery.  States she started taking them again but the bleeding hasn't stopped.

## 2024-05-11 NOTE — ED Provider Notes (Signed)
 [facility]    CSN: [id number] Arrival date & time: 05/11/24  1016      History   Chief Complaint Chief Complaint  Patient presents with   Vaginal Bleeding    HPI [name] is a 48 y.o. female.   Patient presents with abnormal vaginal bleeding x 30 days.  Patient states that she has a history of uterine fibroids and has had abnormal bleeding in the past.  Patient states that she was seen in the ER last year for abnormal uterine bleeding and was put on Provera  at that time which helped the bleeding stopped.  Patient later followed up with OB/GYN and was started on birth control pills for management of this.  Patient states that she had gastric sleeve surgery on 03/09/2024 and she had to stop taking her birth control pills for this procedure.  Patient states that approximately 30 days ago she began to have some vaginal bleeding and restarted her birth control pills and attempt to manage this, but patient states that the bleeding has persisted.  Patient reports that she is going through approximately 4 pads in a day and reports noticing some clots periodically as well.  Denies abdominal cramping, back pain, vomiting, fever, body aches, chills, and urinary symptoms.  Patient did have a bilateral tubal ligation performed.  The history is provided by the patient and medical records.  Vaginal Bleeding   Past Medical History:  Diagnosis Date   Arthritis    Asthma    Diverticulitis of colon    GERD (gastroesophageal reflux disease)    Heart murmur    Hydradenitis    Iron (Fe) deficiency anemia    MRSA (methicillin resistant staph aureus) culture positive    Obesity     Patient Active Problem List   Diagnosis Date Noted   Abrasion of abdominal wall 04/02/2024   Encounter for general adult medical examination with abnormal findings 04/02/2024   Diabetes mellitus screening 04/02/2024   Encounter for lipid screening for cardiovascular disease 04/02/2024   Vitamin  B12 deficiency 12/15/2023   Constipation 10/31/2023   Acute left ankle pain 10/31/2023   Numbness and tingling of right leg 10/05/2023   Rash 08/01/2023   Abdominal pain 06/06/2023   Iron deficiency 06/06/2023   Murmur 06/06/2023   Fibroids 05/13/2023   Abnormal uterine bleeding 04/25/2023   Chronic pain of left knee 03/31/2023   Class 3 severe obesity due to excess calories with body mass index (BMI) of 45.0 to 49.9 in adult 03/21/2023   Hidradenitis suppurativa 03/21/2023   Right lumbar radiculopathy 03/03/2023   Morbid obesity (HCC) 03/03/2023   Chronic pain syndrome 03/03/2023   Encounter for long-term opiate analgesic use 01/29/2023   Primary osteoarthritis of right hip 01/21/2023   Graves disease 07/06/2019   Hyperthyroidism 07/06/2019   Tobacco use disorder 05/26/2019   Perennial and seasonal allergic rhinitis 08/19/2017   Moderate persistent asthma without complication 08/19/2017    Past Surgical History:  Procedure Laterality Date   CESAREAN SECTION     x2   HIATAL HERNIA REPAIR N/A 03/09/2024   Procedure: REPAIR, HERNIA, HIATAL;  Surgeon: Adalberto Acton, MD;  Location: [facility];  Service: General;  Laterality: N/A;   LAPAROSCOPIC GASTRIC SLEEVE RESECTION N/A 03/09/2024   Procedure: GASTRECTOMY, SLEEVE, LAPAROSCOPIC;  Surgeon: Adalberto Acton, MD;  Location: [facility];  Service: General;  Laterality: N/A;   TUBAL LIGATION     UPPER GI ENDOSCOPY N/A 03/09/2024   Procedure: ENDOSCOPY, UPPER GI  TRACT;  Surgeon: Adalberto Acton, MD;  Location: [facility];  Service: General;  Laterality: N/A;   WISDOM TOOTH EXTRACTION  2011    OB History     Gravida  3   Para  2   Term  2   Preterm      AB  1   Living         SAB  1   IAB      Ectopic      Multiple      Live Births               Home Medications    Prior to Admission medications   Medication Sig Start Date End Date Taking? Authorizing Provider  medroxyPROGESTERone  (PROVERA ) 10 MG tablet Take 1  tablet (10 mg total) by mouth daily for 10 days. 05/11/24 05/21/24 Yes Levora Reas A, NP  albuterol  (PROVENTIL ) (2.5 MG/3ML) 0.083% nebulizer solution Inhale 1 vial via nebulizer every 4 hours as needed for wheezing/shortness of breath 04/20/24   Brian Campanile, MD  albuterol  (VENTOLIN  HFA) 108 (90 Base) MCG/ACT inhaler Inhale 2 puffs by mouth every 4 hours as needed for wheezing/shortness of breath 02/27/24   Rochester Chuck, MD  Azelastine  HCl 137 MCG/SPRAY SOLN Place into both nostrils. 04/24/24   [provider]  Azelastine -Fluticasone  137-50 MCG/ACT SUSP 2 sprays into each nostril twice daily 04/16/24   Rochester Chuck, MD  cromolyn  (OPTICROM ) 4 % ophthalmic solution Place 1 drop into both eyes 4 (four) times daily. 03/20/23   Rochester Chuck, MD  docusate sodium  (COLACE) 100 MG capsule Take 1 capsule (100 mg total) by mouth daily as needed for mild constipation. 05/04/24   Colene Dauphin, MD  ELIDEL  1 % cream Apply to aa's atopic dermatitis QD-BID PRN flares. Patient taking differently: Apply 1 Application topically 2 (two) times daily. Apply to aa's atopic dermatitis QD-BID PRN flares. 01/20/24   Artemio Larry, MD  enoxaparin  (LOVENOX ) 40 MG/0.4ML injection Inject 0.4 mLs (40 mg total) into the skin every 12 (twelve) hours for 14 days. 03/10/24 03/24/24  Adalberto Acton, MD  ENSKYCE  0.15-30 MG-MCG tablet Take 1 tablet by mouth every day 09/05/23   Jan Mcgill, MD  EPINEPHrine  0.3 mg/0.3 mL IJ SOAJ injection Inject 0.3 mg into the muscle as needed for anaphylaxis. 04/01/24   Rochester Chuck, MD  esomeprazole  (NEXIUM ) 40 MG capsule Take 40 mg by mouth 2 (two) times daily. 04/24/24   [provider]  ferrous sulfate  (FEROSUL) 325 (65 FE) MG tablet Take 1 tablet by mouth twice daily 03/23/24   Gray, Sarah E, NP  fluticasone  (FLONASE ) 50 MCG/ACT nasal spray Use 1 spray into each nostril every day 04/30/24   Rochester Chuck, MD   Fluticasone -Umeclidin-Vilant (TRELEGY ELLIPTA ) 200-62.5-25 MCG/ACT AEPB Inhale 1 puff into the lungs daily. 04/01/24   Rochester Chuck, MD  gabapentin  (NEURONTIN ) 100 MG capsule Take 1 capsule (100 mg total) by mouth every 12 (twelve) hours for 5 days. 03/10/24 03/15/24  Adalberto Acton, MD  levocetirizine (XYZAL ) 5 MG tablet Take 5 mg by mouth daily. 04/24/24   [provider]  mesalamine  (APRISO ) 0.375 g 24 hr capsule TAKE 4 CAPSULES BY MOUTH DAILY. 05/03/24   Pyrtle, Amber Bail, MD  montelukast  (SINGULAIR ) 10 MG tablet TAKE 1 TABLET BY MOUTH AT  BEDTIME 12/09/23   Rochester Chuck, MD  mupirocin  ointment (BACTROBAN ) 2 % Apply 1 Application topically 2 (two) times daily.  04/02/24   Zorita Hiss, NP  ondansetron  (ZOFRAN -ODT) 4 MG disintegrating tablet Take 1 tablet (4 mg total) by mouth every 6 (six) hours as needed for nausea or vomiting. 03/10/24   Adalberto Acton, MD  oxyCODONE -acetaminophen  (PERCOCET/ROXICET) 5-325 MG tablet Take 1-2 tablets by mouth 2 (two) times daily. 03/25/24   Jodi Munroe, NP  pantoprazole  (PROTONIX ) 40 MG tablet Take 1 tablet (40 mg total) by mouth daily. Take this medication daily, regardless of reflux symptoms 03/10/24   Adalberto Acton, MD  Plecanatide  (TRULANCE ) 3 MG TABS TAKE 1 TABLET BY MOUTH DAILY 01/16/24   Zorita Hiss, NP  Respiratory Therapy Supplies (NEBULIZER MASK ADULT/TUBING) MISC 1 each by Does not apply route daily. 01/21/24   Rochester Chuck, MD  Secukinumab  (COSENTYX  UNOREADY) 300 MG/2ML SOAJ Inject 300 mg into the skin every 28 (twenty-eight) days. 04/01/24   Artemio Larry, MD  Spacer/Aero-Holding Chambers (AEROCHAMBER PLUS) inhaler Use as instructed 08/05/17   Ethlyn Herd, MD  TEZSPIRE  210 MG/1. syringe 210 mg. 01/23/24   [provider]  Vitamin D , Ergocalciferol , (DRISDOL) 1.25 MG (50000 UNIT) CAPS capsule Take 1 capsule by mouth once a week 03/23/24   Zorita Hiss, NP    Family History Family History  Problem  Relation Age of Onset   Diabetes Mother    Hypertension Mother    Diabetes Father    Asthma Other    Hyperlipidemia Other    Hypertension Other    Colon cancer Neg Hx    Esophageal cancer Neg Hx    Rectal cancer Neg Hx    Stomach cancer Neg Hx    Breast cancer Neg Hx    BRCA 1/2 Neg Hx     Social History Social History   Tobacco Use   Smoking status: Former    Current packs/day: 0.33    Types: Cigarettes    Passive exposure: Current   Smokeless tobacco: Never  Vaping Use   Vaping status: Never Used  Substance Use Topics   Alcohol use: Not Currently   Drug use: No     Allergies   Shellfish-derived products and Minocycline    Review of Systems Review of Systems  Genitourinary:  Positive for vaginal bleeding.   Per HPI  Physical Exam Triage Vital Signs ED Triage Vitals  Encounter Vitals Group     BP 05/11/24 1030 (!) 169/93     Girls Systolic BP Percentile --      Girls Diastolic BP Percentile --      Boys Systolic BP Percentile --      Boys Diastolic BP Percentile --      Pulse Rate 05/11/24 1030 76     Resp 05/11/24 1030 16     Temp 05/11/24 1030 98.1 F (36.7 C)     Temp Source 05/11/24 1030 Oral     SpO2 05/11/24 1030 96 %     Weight --      Height --      Head Circumference --      Peak Flow --      Pain Score 05/11/24 1032 0     Pain Loc --      Pain Education --      Exclude from Growth Chart --    No data found.  Updated Vital Signs BP (!) 169/93 (BP Location: Right Arm)   Pulse 76   Temp 98.1 F (36.7 C) (Oral)   Resp 16   LMP 04/10/2024   SpO2  96%   Visual Acuity Right Eye Distance:   Left Eye Distance:   Bilateral Distance:    Right Eye Near:   Left Eye Near:    Bilateral Near:     Physical Exam Vitals and nursing note reviewed.  Constitutional:      General: She is awake. She is not in acute distress.    Appearance: Normal appearance. She is well-developed and well-groomed. She is not ill-appearing.  Abdominal:      General: Abdomen is protuberant.     Palpations: Abdomen is soft. There is no mass.     Tenderness: There is no abdominal tenderness. There is no right CVA tenderness, left CVA tenderness, guarding or rebound.     Hernia: No hernia is present.  Genitourinary:    Comments: Exam deferred  Skin:    General: Skin is warm and dry.   Neurological:     Mental Status: She is alert.   Psychiatric:        Behavior: Behavior is cooperative.      UC Treatments / Results  Labs (all labs ordered are listed, but only abnormal results are displayed) Labs Reviewed  CBC    EKG   Radiology No results found.  Procedures Procedures (including critical care time)  Medications Ordered in UC Medications - No data to display  Initial Impression / Assessment and Plan / UC Course  I have reviewed the triage vital signs and the nursing notes.  Pertinent labs & imaging results that were available during my care of the patient were reviewed by me and considered in my medical decision making (see chart for details).     Patient is well-appearing.  Vitals are stable.  Blood pressure mildly elevated at 169/93.  No significant findings upon assessment.  Nontender upon palpation to abdomen.  GU exam deferred.  Prescribed Provera  for abnormal uterine bleeding.  Discussed importance of stopping birth control while taking this.  Ordered CBC.  Discussed follow-up, return, and strict ER precautions. Final Clinical Impressions(s) / UC Diagnoses   Final diagnoses:  Abnormal uterine bleeding (AUB)     Discharge Instructions      Start taking 1 tablet of Provera  once daily for 10 days to assist with your bleeding. Stop taking your birth control while you are taking this. Follow-up with your OB/GYN as soon as possible for further evaluation and management of this. If your bleeding becomes more severe and you are going through a pad or more in 1 hour, you develop severe abdominal cramping, excessive  vomiting, or fever please seek immediate medical treatment in the emergency department. We have drawn some blood work today to evaluate your blood counts and we will call if these results are concerning and require any additional treatment. Return here as needed.    ED Prescriptions     Medication Sig Dispense Auth. Provider   medroxyPROGESTERone  (PROVERA ) 10 MG tablet Take 1 tablet (10 mg total) by mouth daily for 10 days. 10 tablet Levora Reas A, NP      PDMP not reviewed this encounter.   Levora Reas A, NP 05/11/24 1108

## 2024-05-12 ENCOUNTER — Ambulatory Visit (HOSPITAL_COMMUNITY): Payer: Self-pay

## 2024-05-12 DIAGNOSIS — L732 Hidradenitis suppurativa: Secondary | ICD-10-CM | POA: Diagnosis not present

## 2024-05-18 ENCOUNTER — Other Ambulatory Visit: Payer: Self-pay | Admitting: Allergy & Immunology

## 2024-05-26 ENCOUNTER — Encounter: Attending: Physical Medicine and Rehabilitation | Admitting: Physical Medicine and Rehabilitation

## 2024-05-26 ENCOUNTER — Encounter: Payer: Self-pay | Admitting: Physical Medicine and Rehabilitation

## 2024-05-26 VITALS — BP 127/84 | HR 66 | Wt 274.0 lb

## 2024-05-26 DIAGNOSIS — L732 Hidradenitis suppurativa: Secondary | ICD-10-CM | POA: Diagnosis not present

## 2024-05-26 DIAGNOSIS — Z5181 Encounter for therapeutic drug level monitoring: Secondary | ICD-10-CM | POA: Diagnosis not present

## 2024-05-26 DIAGNOSIS — R202 Paresthesia of skin: Secondary | ICD-10-CM | POA: Insufficient documentation

## 2024-05-26 DIAGNOSIS — G894 Chronic pain syndrome: Secondary | ICD-10-CM | POA: Insufficient documentation

## 2024-05-26 DIAGNOSIS — Z79891 Long term (current) use of opiate analgesic: Secondary | ICD-10-CM | POA: Insufficient documentation

## 2024-05-26 DIAGNOSIS — R200 Anesthesia of skin: Secondary | ICD-10-CM | POA: Diagnosis not present

## 2024-05-26 NOTE — Patient Instructions (Signed)
 Contonue current medicaitons - I will refill Percocet for 3 months  Once you get an appointment with Dr. Vernetta, message me and we will coordinate on Lumbar MRI if you get a hip MRI as well  Continue being active and losing weight  Follow up with Fidela in 3 months

## 2024-05-26 NOTE — Progress Notes (Signed)
 Subjective:    Patient ID: [name], female    DOB: 1976/03/05, 48 y.o.   MRN: [id number]  HPI  [name] is a 48 y.o. year old female  who  has a past medical history of Arthritis, Asthma, Diverticulitis of colon, GERD (gastroesophageal reflux disease), Heart murmur, Hydradenitis, Iron (Fe) deficiency anemia, MRSA (methicillin resistant staph aureus) culture positive, and Obesity.   They are presenting to PM&R clinic for follow up related to  chronic pain management with right hip and left knee pain, right lumbar radiculopathy .  Plan from last visit: Primary  Osteoarthritis of Right Hip: Continue HEP as Tolerated. Continue current medication regimen/ Continue to Monitor. 03/25/2024 Chronic Pain of Left Knee : Ortho Following. Continue to Monitor. Continue HEP as Tolerated. 03/25/2024 Acute Left Ankle Pain: No complaints today. Ortho following.  Continue to Monitor. 03/25/2024. Morbid Obesity: S/P:  GASTRECTOMY, SLEEVE, LAPAROSCOPIC N/A General  ENDOSCOPY, UPPER GI TRACT N/A General  REPAIR, HERNIA, HIATAL      Continue Healthy Diet Regimen. Continue to Monitor. 03/25/2024. Chronic Pain Syndrome: Refilled: Oxycodone  5-325 mg 1-2 two tablets twice a day as needed for pain #120. Second script sent for the following month. We will continue the opioid monitoring program, this consists of regular clinic visits, examinations, urine drug screen, pill counts as well as use of [place]  Controlled Substance Reporting system. A 12 month History has been reviewed on the [place]  Controlled Substance Reporting System on 03/25/2024   F/U in 2 months    Interval Hx:  - Therapies: She's walking more at home since her sleeve surgery-no formal PT just me working myself out with a barbells, treadmill. No specific goals other than weight loss. With increased activity, her right hip is hurting more.    - Follow ups: 6/10 - Dr. Joane w/ sports medicine - L knee pain 48 y.o.  female with left knee pain due to DJD.  Plan for Zilretta  injection today.  Can repeat this in 3 months if needed.   Additionally she has significant right hip pain due to DJD.  She is in the process of losing weight after having a gastric sleeve with BMI currently 42.8.  I think it is reasonable to refer her back to orthopedic surgery to talk total hip replacement in the near future.  I expect she will continue to lose weight pretty quickly and have her BMI under 40 pretty soon. -- Knee injection worked well; still getting benefit. Next shot is in September. Has not gotten a call from Dr. Damian office yet.   Gastric sleeve 03/09/24 with Dr. Signe -  did well after surgery!SABRA She went back last week and was told she is doing well; hitting weight loss goals    - Falls:  Clemens the other day, I was getting up to go cook for her grandkids-my grandsone got in my way and I went down. She fell on her right hip, no head strike, no LOC. She does not feel any residual pain from the fall. Only fall since she's been here.    - DME: Using narrow based 3 point cane   - Medications:  Percocet 5 mg 05/02/24 1-2 tabs BID - She uses the full 4 tabs; 1 in the morning, one in the afternoon, and 2 at nighttime. No s/e--she had constipation before starting medication but it's well managed.   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    - Other concerns: Grandchildren she takes care of a 20 year old and 2x 1 year olds. 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   Pain Inventory Average Pain 7 Pain Right Now 8 My pain is constant, sharp, burning, and stabbing  In the last 24 hours, has pain interfered with the following? General activity 7 Relation with others 7 Enjoyment of life 6 What TIME of day is  your pain at its worst? night Sleep (in general) Fair  Pain is worse with: walking, sitting, standing, and some activites Pain improves with: medication and heat Relief from Meds: 8  Family History  Problem Relation Age of Onset   Diabetes Mother    Hypertension Mother    Diabetes Father    Asthma Other    Hyperlipidemia Other    Hypertension Other    Colon cancer Neg Hx    Esophageal cancer Neg Hx    Rectal cancer Neg Hx    Stomach cancer Neg Hx    Breast cancer Neg Hx    BRCA 1/2 Neg Hx    Social History   Socioeconomic History   Marital status: Single    Spouse name: Not on file   Number of children: Not on file   Years of education: Not on file   Highest education level: 11th grade  Occupational History   Occupation: disabled  Tobacco Use   Smoking status: Former    Current packs/day: 0.33    Types: Cigarettes    Passive exposure: Current   Smokeless tobacco: Never  Vaping Use   Vaping status: Never Used  Substance and Sexual Activity   Alcohol use: Not Currently   Drug use: No   Sexual activity: Yes    Birth control/protection: None  Other Topics Concern   Not on file  Social History Narrative   Lives with her daughter   Social Drivers of Health   Financial Resource Strain: Low Risk  (02/20/2024)   Overall Financial Resource Strain (CARDIA)    Difficulty of Paying Living Expenses: Not hard at all  Food Insecurity: No Food Insecurity (03/11/2024)   Hunger Vital Sign    Worried About Running Out of Food in the Last Year: Never true    Ran Out of Food in the Last Year: Never true  Transportation Needs: No Transportation Needs (03/11/2024)   PRAPARE - Administrator, Civil Service (Medical): No    Lack of Transportation (Non-Medical): No  Physical Activity: Inactive (02/20/2024)   Exercise Vital Sign    Days of Exercise per Week: 0 days    Minutes of Exercise per Session: 0 min  Stress: Stress Concern Present (02/20/2024)   [employer]  of Occupational Health - Occupational Stress Questionnaire    Feeling of Stress : To some extent  Social Connections: Moderately Isolated (03/09/2024)   Social Connection and Isolation Panel    Frequency of Communication with Friends and Family: More than three times a week    Frequency of Social Gatherings with Friends and Family: More than three times a week    Attends Religious Services: More than 4 times per year    Active Member of [employer] or Organizations: No    Attends Banker Meetings: Never    Marital Status: Divorced   Past Surgical  History:  Procedure Laterality Date   CESAREAN SECTION     x2   HIATAL HERNIA REPAIR N/A 03/09/2024   Procedure: REPAIR, HERNIA, HIATAL;  Surgeon: Signe Mitzie LABOR, MD;  Location: [facility];  Service: General;  Laterality: N/A;   LAPAROSCOPIC GASTRIC SLEEVE RESECTION N/A 03/09/2024   Procedure: GASTRECTOMY, SLEEVE, LAPAROSCOPIC;  Surgeon: Signe Mitzie LABOR, MD;  Location: [facility];  Service: General;  Laterality: N/A;   TUBAL LIGATION     UPPER GI ENDOSCOPY N/A 03/09/2024   Procedure: ENDOSCOPY, UPPER GI TRACT;  Surgeon: Signe Mitzie LABOR, MD;  Location: [facility];  Service: General;  Laterality: N/A;   WISDOM TOOTH EXTRACTION  2011   Past Surgical History:  Procedure Laterality Date   CESAREAN SECTION     x2   HIATAL HERNIA REPAIR N/A 03/09/2024   Procedure: REPAIR, HERNIA, HIATAL;  Surgeon: Signe Mitzie LABOR, MD;  Location: [facility];  Service: General;  Laterality: N/A;   LAPAROSCOPIC GASTRIC SLEEVE RESECTION N/A 03/09/2024   Procedure: GASTRECTOMY, SLEEVE, LAPAROSCOPIC;  Surgeon: Signe Mitzie LABOR, MD;  Location: [facility];  Service: General;  Laterality: N/A;   TUBAL LIGATION     UPPER GI ENDOSCOPY N/A 03/09/2024   Procedure: ENDOSCOPY, UPPER GI TRACT;  Surgeon: Signe Mitzie LABOR, MD;  Location: [facility];  Service: General;  Laterality: N/A;   WISDOM TOOTH EXTRACTION  2011   Past Medical History:  Diagnosis Date   Arthritis    Asthma     Diverticulitis of colon    GERD (gastroesophageal reflux disease)    Heart murmur    Hydradenitis    Iron (Fe) deficiency anemia    MRSA (methicillin resistant staph aureus) culture positive    Obesity    LMP 04/10/2024   Opioid Risk Score:   Fall Risk Score:  `1  Depression screen [facility] 2/9     03/25/2024    8:54 AM 02/20/2024    9:38 AM 12/15/2023    9:29 AM 12/01/2023    9:35 AM 10/31/2023    9:57 AM 06/09/2023   10:04 AM 04/25/2023    9:49 AM  Depression screen PHQ 2/9  Decreased Interest 0 0 0 0 0 0 0  Down, Depressed, Hopeless  0 0 0 0 0 0  PHQ - 2 Score 0 0 0 0 0 0 0  Altered sleeping  0     0  Tired, decreased energy  0     0  Change in appetite  0     0  Feeling bad or failure about yourself   0       Trouble concentrating  0     0  Moving slowly or fidgety/restless  0     0  Suicidal thoughts  0     0  PHQ-9 Score  0     0  Difficult doing work/chores  Not difficult at all         Review of Systems  Musculoskeletal:  Positive for back pain.       Pain in left knee, right hip down to right thigh, left thigh pain  All other systems reviewed and are negative.      Objective:   Physical Exam   Constitution: Appropriate appearance for age. No apparent distress  +Obese Resp: No respiratory distress. No accessory muscle usage. on RA and CTAB Cardio: Well perfused appearance. No peripheral edema. Abdomen: Nondistended. Nontender.   Psych: Appropriate mood and affect. Neuro: AAOx4. No apparent cognitive deficits  Neurologic Exam:   Babinsky: flexor responses b/l.   Hoffmans: negative b/l Sensory exam:  Reduced sensation to light touch R anteriolateral thigh, ending at lateral knee; otherwise intact  -- Does endorse numbness/tingling in same distribution on the L after prolonged sitting >10 minutes  Motor exam: strength 5/5 throughout bilateral upper extremities, bilateral lower extremity  Coordination: Fine motor coordination was normal.   Gait: +  trendelenberg with right hip drop; L had cane   MSK:  + TTP bilateral lumbar paraspinals  + facet loading  -  slump        Assessment & Plan:   SOL ODOR is a 47 y.o. year old female  who  has a past medical history of Arthritis, Asthma, Diverticulitis of colon, GERD (gastroesophageal reflux disease), Heart murmur, Hydradenitis, Iron (Fe) deficiency anemia, MRSA (methicillin resistant staph aureus) culture positive, and Obesity.   They are presenting to PM&R clinic for follow up related to  chronic pain management with right hip and left knee pain, right lumbar radiculopathy .  Chronic pain syndrome Encounter for long-term opiate analgesic use Encounter for therapeutic drug monitoring Continue current medicaitons - I will refill Percocet for 3 months  Follow up with Fidela in 3 months  Numbness and tingling of right leg  Once you get an appointment with Dr. Vernetta, message me and we will coordinate on Lumbar MRI if you get a hip MRI as well  Morbid obesity (HCC)  Continue being active and losing weight

## 2024-05-27 ENCOUNTER — Other Ambulatory Visit: Payer: Self-pay | Admitting: Nurse Practitioner

## 2024-05-27 ENCOUNTER — Ambulatory Visit

## 2024-05-27 DIAGNOSIS — J4550 Severe persistent asthma, uncomplicated: Secondary | ICD-10-CM | POA: Diagnosis not present

## 2024-05-29 ENCOUNTER — Encounter (HOSPITAL_COMMUNITY): Payer: Self-pay | Admitting: *Deleted

## 2024-05-29 ENCOUNTER — Ambulatory Visit (HOSPITAL_COMMUNITY)
Admission: EM | Admit: 2024-05-29 | Discharge: 2024-05-29 | Disposition: A | Discharge status: Home / Self Care | Attending: Nurse Practitioner | Admitting: Nurse Practitioner

## 2024-05-29 ENCOUNTER — Ambulatory Visit (INDEPENDENT_AMBULATORY_CARE_PROVIDER_SITE_OTHER)

## 2024-05-29 DIAGNOSIS — S63602A Unspecified sprain of left thumb, initial encounter: Secondary | ICD-10-CM

## 2024-05-29 DIAGNOSIS — T07XXXA Unspecified multiple injuries, initial encounter: Secondary | ICD-10-CM | POA: Diagnosis not present

## 2024-05-29 DIAGNOSIS — S299XXA Unspecified injury of thorax, initial encounter: Secondary | ICD-10-CM

## 2024-05-29 DIAGNOSIS — M79642 Pain in left hand: Secondary | ICD-10-CM
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
## 2024-06-01 ENCOUNTER — Other Ambulatory Visit: Payer: Self-pay

## 2024-06-01 MED ORDER — OXYCODONE-ACETAMINOPHEN 5-325 MG PO TABS: 1.0000 | ORAL_TABLET | Freq: Two times a day (BID) | ORAL | 0 refills | Status: DC

## 2024-06-01 NOTE — Telephone Encounter (Signed)
 Patient came by the office stating she needs her Oxy/APAP filled. Last pick up was 05/02/24, last appt 05/26/24. No script was sent in at last visit.[facility].

## 2024-06-02 MED ORDER — OXYCODONE-ACETAMINOPHEN 5-325 MG PO TABS: 1.0000 | ORAL_TABLET | Freq: Two times a day (BID) | ORAL | 0 refills | Status: AC

## 2024-06-02 MED ORDER — OXYCODONE-ACETAMINOPHEN 5-325 MG PO TABS: 1.0000 | ORAL_TABLET | Freq: Two times a day (BID) | ORAL | 0 refills | Status: DC

## 2024-06-02 NOTE — Addendum Note (Signed)
 Addended by: EMELINE SEARCH on: 06/02/2024 10:36 AM   Modules accepted: Orders

## 2024-06-07 ENCOUNTER — Other Ambulatory Visit (HOSPITAL_COMMUNITY)
Admission: RE | Admit: 2024-06-07 | Discharge: 2024-06-07 | Disposition: A | Discharge status: Home / Self Care | Source: Ambulatory Visit | Attending: Obstetrics and Gynecology | Admitting: Obstetrics and Gynecology

## 2024-06-07 ENCOUNTER — Encounter: Payer: Self-pay | Admitting: Obstetrics and Gynecology

## 2024-06-07 ENCOUNTER — Encounter (HOSPITAL_COMMUNITY): Payer: Self-pay | Admitting: Surgery

## 2024-06-07 ENCOUNTER — Ambulatory Visit: Admitting: Obstetrics and Gynecology

## 2024-06-07 VITALS — BP 139/87 | HR 88 | Ht 68.0 in | Wt 270.2 lb

## 2024-06-07 DIAGNOSIS — Z01419 Encounter for gynecological examination (general) (routine) without abnormal findings: Secondary | ICD-10-CM

## 2024-06-07 DIAGNOSIS — Z113 Encounter for screening for infections with a predominantly sexual mode of transmission: Secondary | ICD-10-CM | POA: Diagnosis not present

## 2024-06-07 DIAGNOSIS — Z1331 Encounter for screening for depression: Secondary | ICD-10-CM

## 2024-06-07 NOTE — Progress Notes (Signed)
 June 17th went to urgent due to vaginal bleeding, was given 10 day meds, hasn't bled since.   Pt PCP sent her for PAP screening. Last PAP was 01/03/2023, negative for NILM.   Pt scored 12 on PHQ and 21 on GAD. Pt refuses a referral to counselor, stating she will not feel this way tomorrow. She has been homeless but moves into her apartment tomorrow.   Pt states her breast are leaking from hidradenitis. Had mammogram in 04/14/24. Had colonoscopy 01/26/24 prior to gastric sleeve surgery on 03/09/24.

## 2024-06-07 NOTE — Progress Notes (Signed)
 ANNUAL EXAM Patient name: [name] MRN [id number]  Date of birth: 1976/07/25 Chief Complaint:   Annual exam  History of Present Illness:   [name] is a 48 y.o. [phone]  female being seen today for a routine annual exam.  Current complaints: was seen in [facility] June 17th for AUB, given provera , no bleeding since then  Was on birth control prior to gastric sleeve,stopped it for surgery, then was given the ok to restart birth control 3 weeks ago and started back  Sees derm for HS that she has had for years, has tried multiple different regimens, just started doing palmetto infusions   Patient's last menstrual period was 05/11/2024 (approximate).   Gynecologic History Patient's last menstrual period was . Contraception: BTL Last Eje:7975 . Results were: NILM, neg HPV Last mammogram:03/2024 . Results were:Normal     06/07/2024    2:22 PM 05/26/2024    9:10 AM 03/25/2024    8:54 AM 02/20/2024    9:38 AM 12/15/2023    9:29 AM  Depression screen PHQ 2/9  Decreased Interest 0 0 0 0 0  Down, Depressed, Hopeless 0 0  0 0  PHQ - 2 Score 0 0 0 0 0  Altered sleeping 3   0   Tired, decreased energy 3   0   Change in appetite 3   0   Feeling bad or failure about yourself  3   0   Trouble concentrating 0   0   Moving slowly or fidgety/restless 0   0   Suicidal thoughts 0   0   PHQ-9 Score 12   0   Difficult doing work/chores    Not difficult at all         06/07/2024    2:23 PM 01/03/2023    9:15 AM  GAD 7 : Generalized Anxiety Score  Nervous, Anxious, on Edge 3 0  Control/stop worrying 3 0  Worry too much - different things 3 0  Trouble relaxing 3 0  Restless 3 0  Easily annoyed or irritable 3 0  Afraid - awful might happen 3 0  Total GAD 7 Score 21 0     Review of Systems:   Pertinent items are noted in HPI Denies any headaches, blurred vision, fatigue, shortness of breath, chest pain, abdominal pain, abnormal vaginal discharge/itching/odor/irritation, problems  with periods, bowel movements, urination, or intercourse unless otherwise stated above. Pertinent History Reviewed:  Reviewed past medical,surgical, social and family history.  Reviewed problem list, medications and allergies. Physical Assessment:   Vitals:   06/07/24 1354  BP: 139/87  Pulse: 88  Weight: 270 lb 3.2 oz (122.6 kg)  Height: 5' 8 (1.727 m)  Body mass index is 41.08 kg/m.        Physical Examination:   General appearance - well appearing, and in no distress  Mental status - alert, oriented   Psych:  She has a normal mood and affect  Skin - warm and dry, normal color  Chest - effort normal, all lung fields clear to auscultation bilaterally  Heart - normal rate and regular rhythm  Neck:  midline trachea  Breasts - breasts appear normal, no suspicious masses, HS lesion R breast and L breast   Abdomen - soft, nontender, nondistended  Pelvic - VULVA: normal appearing vulva with no masses, tenderness or lesions  VAGINA: normal appearing vagina with normal color and discharge, no lesions  CERVIX: normal appearing cervix without discharge or lesions, no  CMT  UTERUS: uterus is felt to be normal size, shape, consistency and nontender   ADNEXA: No adnexal masses or tenderness noted.  Extremities:  No swelling or varicosities noted  Chaperone present for exam  No results found for this or any previous visit (from the past 24 hours).  Assessment & Plan:  1. Encounter for annual routine gynecological examination (Primary) UTD on mammogram UTD on pap UTD on colonoscopy  Continue with infusions for HS  Routine annual blood with with PCP  2. Positive depression screening Elevated screening today, declines counseling referral. Is moving in to an apartment tomorrow and reports these numbers will be improved tomorrow   3. Screening examination for STD (sexually transmitted disease)  - Cervicovaginal ancillary only( [place]) - HIV antibody (with reflex) - Hepatitis B  Surface AntiGEN - Hepatitis C Antibody - RPR  Labs/procedures today:   Mammogram: in 1 year, or sooner if problems Colonoscopy: per GI, or sooner if problems  Orders Placed This Encounter  Procedures   HIV antibody (with reflex)   Hepatitis B Surface AntiGEN   Hepatitis C Antibody   RPR    Meds: No orders of the defined types were placed in this encounter.   Follow-up: Return in about 1 year (around 06/07/2025) for Destiny [place] [name], FNP

## 2024-06-08 ENCOUNTER — Ambulatory Visit: Payer: Self-pay | Admitting: Obstetrics and Gynecology

## 2024-06-08 ENCOUNTER — Encounter: Payer: Self-pay | Admitting: Dietician

## 2024-06-08 ENCOUNTER — Encounter: Attending: Surgery | Admitting: Dietician

## 2024-06-08 VITALS — Ht 68.0 in | Wt 268.8 lb

## 2024-06-08 DIAGNOSIS — Z6841 Body Mass Index (BMI) 40.0 and over, adult: Secondary | ICD-10-CM | POA: Diagnosis not present

## 2024-06-08 DIAGNOSIS — Z713 Dietary counseling and surveillance: Secondary | ICD-10-CM | POA: Insufficient documentation

## 2024-06-08 DIAGNOSIS — E669 Obesity, unspecified: Secondary | ICD-10-CM | POA: Insufficient documentation

## 2024-06-08 DIAGNOSIS — Z903 Acquired absence of stomach [part of]: Secondary | ICD-10-CM | POA: Insufficient documentation

## 2024-06-08 LAB — HEPATITIS C ANTIBODY: Hep C Virus Ab: NONREACTIVE

## 2024-06-08 LAB — CERVICOVAGINAL ANCILLARY ONLY
Chlamydia: NEGATIVE
Comment: NEGATIVE
Comment: NEGATIVE
Comment: NORMAL
Neisseria Gonorrhea: NEGATIVE
Trichomonas: NEGATIVE

## 2024-06-08 LAB — HIV ANTIBODY (ROUTINE TESTING W REFLEX): HIV Screen 4th Generation wRfx: NONREACTIVE

## 2024-06-08 LAB — RPR: RPR Ser Ql: NONREACTIVE

## 2024-06-08 LAB — HEPATITIS B SURFACE ANTIGEN: Hepatitis B Surface Ag: NEGATIVE

## 2024-06-08 NOTE — Progress Notes (Signed)
 Bariatric Nutrition Follow-Up Visit Medical Nutrition Therapy  Appt Start Time: 0859   End Time: 0932  Surgery date: 03/09/2024 Surgery type: Sleeve Gastrectomy  NUTRITION ASSESSMENT  Anthropometrics  Start weight at [facility]: 324.7 lbs (date: 06/09/2023)  Height: 68 in Weight today: 268.8 lb  Clinical   Pharmacotherapy: History of weight loss medication used: zepbound  has not been approved Medical hx: reflux, asthma Medications: see list   Labs: LDL 126; iron 32; RDW 18.9; A1c 6.1 Notable signs/symptoms: none noted Any previous deficiencies? No Bowel Habits: Every day to every other day no complaints   Body Composition Scale 03/23/2024 06/08/2024  Current Body Weight 284.1 268.8  Total Body Fat % 47.2 45.7  Visceral Fat 16 15  Fat-Free Mass % 52.7 54.2   Total Body Water  % 40.8 41.6  Muscle-Mass lbs 33.4 33.6  BMI 43.2 40.9  Body Fat Displacement           Torso  lbs 83.2 76.3         Left Leg  lbs 16.6 15.2         Right Leg  lbs 16.6 15.2         Left Arm  lbs 8.3 7.6         Right Arm  lbs 8.3 7.6    Lifestyle & Dietary Hx  Pt states she has been living with others, stating she gets her own place today (picking up the key). Pt states right now everything she owns is in her car. Pt states she still has a membership with [employer], stating her friend wants her to go the [facility] with her. Pt states she tried a soda, but it did not feel good. Pt states she does not like the chewable multivitamin and would like to move to a capsule  Estimated daily fluid intake: 64 oz Estimated daily protein intake: 60 g Supplements: multivitamin and calcium Current average weekly physical activity: ADLs   24-Hr Dietary Recall First Meal: half a protein shake with meds and vitamin Snack: slice of bread with [place]  Second Meal: other half of protein shake Snack: chicken salad with crackers  Third Meal: vegetables and a meat Snack: nabs Beverages: water   Post-Op Goals/ Signs/  Symptoms Using straws: yes Drinking while eating: no Chewing/swallowing difficulties: no Changes in vision: no Changes to mood/headaches: no Hair loss/changes to skin/nails: no Difficulty focusing/concentrating: no Sweating: no Limb weakness: no Dizziness/lightheadedness: no Palpitations: no  Carbonated/caffeinated beverages: no N/V/D/C/Gas: takes stool softener prescribed by PCP Abdominal pain: no Dumping syndrome: no   NUTRITION DIAGNOSIS  Overweight/obesity ([place]-3.3) related to past poor dietary habits and physical inactivity as evidenced by completed bariatric surgery and following dietary guidelines for continued weight loss and healthy nutrition status.   NUTRITION INTERVENTION Nutrition counseling (C-1) and education (E-2) to facilitate bariatric surgery goals, including: Diet advancement to the standard prep plan The importance of consuming adequate calories as well as certain nutrients daily due to the body's need for essential vitamins, minerals, and fats The importance of daily physical activity and to reach a goal of at least 150 minutes of moderate to vigorous physical activity weekly (or as directed by their physician) due to benefits such as increased musculature and improved lab values The importance of intuitive eating specifically learning hunger-satiety cues and understanding the importance of learning a new body: The importance of mindful eating to avoid grazing behaviors   Goals New: remember vitamins prescribed by PCP (Vit B12), as well as multivitamin and calcium.  Find a capsule instead of chewable. New: increase physical activity; get back to the gym; check out the BEL[employer] program  Handouts Provided Include  Standard Prep Plan Advancement Guide Multivitamin and Calcium recommendation sheet  Learning Style & Readiness for Change Teaching method utilized: Visual & Auditory  Demonstrated degree of understanding via: Teach Back  Readiness Level: ready Barriers  to learning/adherence to lifestyle change: nothing identified  RD's Notes for Next Visit Assess adherence to pt chosen goals  MONITORING & EVALUATION Dietary intake, weekly physical activity, body weight.  Next Steps Patient is to follow-up in 3 months for 6 month post-op follow-up/class.

## 2024-06-12 ENCOUNTER — Other Ambulatory Visit (HOSPITAL_COMMUNITY): Payer: Self-pay

## 2024-06-18 ENCOUNTER — Other Ambulatory Visit (HOSPITAL_COMMUNITY): Payer: Self-pay

## 2024-06-18 MED ORDER — PANTOPRAZOLE SODIUM 40 MG PO TBEC
40.0000 mg | DELAYED_RELEASE_TABLET | Freq: Every day | ORAL | 1 refills | Status: DC
  Filled 2024-06-18: qty 90, 90d supply, fill #0
  Filled 2024-09-16: qty 90, 90d supply, fill #1

## 2024-06-23 DIAGNOSIS — L732 Hidradenitis suppurativa: Secondary | ICD-10-CM | POA: Diagnosis not present

## 2024-06-24 ENCOUNTER — Ambulatory Visit (INDEPENDENT_AMBULATORY_CARE_PROVIDER_SITE_OTHER)

## 2024-06-24 DIAGNOSIS — J4550 Severe persistent asthma, uncomplicated: Secondary | ICD-10-CM

## 2024-07-05 ENCOUNTER — Ambulatory Visit: Payer: 59 | Admitting: Dermatology

## 2024-07-05 ENCOUNTER — Encounter: Payer: Self-pay | Admitting: Dermatology

## 2024-07-05 DIAGNOSIS — Z79899 Other long term (current) drug therapy: Secondary | ICD-10-CM

## 2024-07-05 DIAGNOSIS — Z7189 Other specified counseling: Secondary | ICD-10-CM

## 2024-07-05 DIAGNOSIS — L732 Hidradenitis suppurativa: Secondary | ICD-10-CM | POA: Diagnosis not present

## 2024-07-05 NOTE — Progress Notes (Signed)
   Follow-Up Visit   Subjective  [name] is a 48 y.o. female who presents for the following: HS 43m f/u, inframammary, intermammary, breast, axilla Infliximab infusions q 3-4 wks, improved since she has been on infusions   The following portions of the chart were reviewed this encounter and updated as appropriate: medications, allergies, medical history  Review of Systems:  No other skin or systemic complaints except as noted in HPI or Assessment and Plan.  Objective  Well appearing patient in no apparent distress; mood and affect are within normal limits.   A focused examination was performed of the following areas: Inframammary, axilla, flanks, buttocks  Relevant exam findings are noted in the Assessment and Plan.    Assessment & Plan   HIDRADENITIS SUPPURATIVA Inframammary, intermammary, breast, axilla Quantiferon TB Gold 12/18/23 negative Exam: soft hyperpigmented plaque with focal areas of draining cysts bil breast areola area, inframammary hyperpigmented pitted scarring, ropy scarring with hyperpigmentation and no draining cysts bil axilla,  cyst at gluteal cleft, hyperpigmented pitted scarring at flanks  Chronic and persistent condition with duration or expected duration over one year. Condition is improving with treatment but not currently at goal.   Hidradenitis Suppurativa is a chronic; persistent; non-curable, but treatable condition due to abnormal inflamed sweat glands in the body folds (axilla, inframammary, groin, medial thighs), causing recurrent painful draining cysts and scarring. It can be associated with severe scarring acne and cysts; also abscesses and scarring of scalp. The goal is control and prevention of flares, as it is not curable. Scars are permanent and can be thickened. Treatment may include daily use of topical medication and oral antibiotics.  Oral isotretinoin may also be helpful.  For some cases, Humira or Cosentyx  (biologic injections) may  be prescribed to decrease the inflammatory process and prevent flares.  When indicated, inflamed cysts may also be treated surgically.  Treatment Plan: Cont Infliximab infusions as prescribed by [facility], finishing loading , then q 8 wks Cont Cefdinir  300 mg PO bid prn severe flares as prescribed by [facility] Cont Fluconazole  150mg  1 po q wk prn yeast injections Pt has d/c'd topical clindamycin      Return in about 6 months (around 01/05/2025) for HS.  I, Sonya Hupman, RMA, am acting as scribe for Rexene Rattler, MD .   Documentation: I have reviewed the above documentation for accuracy and completeness, and I agree with the above.  Rexene Rattler, MD

## 2024-07-05 NOTE — Patient Instructions (Signed)

## 2024-07-06 ENCOUNTER — Other Ambulatory Visit: Payer: Self-pay | Admitting: Obstetrics and Gynecology

## 2024-07-06 DIAGNOSIS — D219 Benign neoplasm of connective and other soft tissue, unspecified: Secondary | ICD-10-CM

## 2024-07-06 DIAGNOSIS — N939 Abnormal uterine and vaginal bleeding, unspecified: Secondary | ICD-10-CM

## 2024-07-15 DIAGNOSIS — Z4889 Encounter for other specified surgical aftercare: Secondary | ICD-10-CM | POA: Diagnosis not present

## 2024-07-22 ENCOUNTER — Ambulatory Visit (INDEPENDENT_AMBULATORY_CARE_PROVIDER_SITE_OTHER)

## 2024-07-22 DIAGNOSIS — J4550 Severe persistent asthma, uncomplicated: Secondary | ICD-10-CM

## 2024-07-23 ENCOUNTER — Other Ambulatory Visit: Payer: Self-pay | Admitting: Allergy & Immunology

## 2024-08-10 NOTE — Telephone Encounter (Signed)
 Patient reran for Zilretta . Case ID: [id number]. Pending approval.

## 2024-08-10 NOTE — Telephone Encounter (Signed)
 Patient called asking to schedule a repeat Zilretta  injection.  Can you confirm that authorization is still okay at this time?

## 2024-08-11 NOTE — Telephone Encounter (Signed)
Scheduled 9/24

## 2024-08-11 NOTE — Telephone Encounter (Signed)
 Patient needs an appointment once medication is stocked.  Zilretta  is approved for left knee. Primary: No prior authorization, medical notes or referrals needed. Patient has a Fully Funded Dual complete (Medicare/Medicaid) HMO/POS plan with an effective date of 11/26/2023. Plan follows Medicare guidelines. Patient responsibility for [phone] (Zilretta ) CPT code 79388 and specialist office visit will be 20% with the remaining covered at 80% by the payer at the contracted rate. Patient has a deductible of $257. Deductible has been met. Patient has an out of pocket maximum of $9350 and has accumulated $2804.08. If out of pocket is met, coverage goes to 100%. Patient has Medicaid as secondary insurance. Member's responsibilities left over by Medicare can be billed to Medicaid (if eligible). Member will be covered 100% of the allowable amount. Reference #: [id number]  Secondary: [employer] is unable to complete your request for a benefit investigation at this time for J3304 (Zilretta ) and CPT 20611. [place]  [employer] will not communicate with third parties. Please contact them directly at [email or web address] to obtain eligibility and coverage.  Reference #: Sharion HERO 08/10/2024  Case ID: [id number] Exp: 02/08/2024

## 2024-08-12 ENCOUNTER — Other Ambulatory Visit: Payer: Self-pay | Admitting: Allergy & Immunology

## 2024-08-18 ENCOUNTER — Ambulatory Visit: Admitting: Family Medicine

## 2024-08-18 ENCOUNTER — Other Ambulatory Visit: Payer: Self-pay

## 2024-08-18 ENCOUNTER — Encounter: Payer: Self-pay | Admitting: Family Medicine

## 2024-08-18 ENCOUNTER — Telehealth: Payer: Self-pay | Admitting: Nurse Practitioner

## 2024-08-18 VITALS — BP 118/76 | HR 84 | Ht 68.0 in | Wt 257.0 lb

## 2024-08-18 DIAGNOSIS — M1611 Unilateral primary osteoarthritis, right hip: Secondary | ICD-10-CM

## 2024-08-18 DIAGNOSIS — M25562 Pain in left knee: Secondary | ICD-10-CM | POA: Diagnosis not present

## 2024-08-18 DIAGNOSIS — L732 Hidradenitis suppurativa: Secondary | ICD-10-CM | POA: Diagnosis not present

## 2024-08-18 DIAGNOSIS — M1712 Unilateral primary osteoarthritis, left knee: Secondary | ICD-10-CM | POA: Diagnosis not present

## 2024-08-18 DIAGNOSIS — E611 Iron deficiency: Secondary | ICD-10-CM

## 2024-08-18 DIAGNOSIS — G8929 Other chronic pain: Secondary | ICD-10-CM | POA: Diagnosis not present

## 2024-08-18 DIAGNOSIS — E538 Deficiency of other specified B group vitamins: Secondary | ICD-10-CM

## 2024-08-18 MED ORDER — TRIAMCINOLONE ACETONIDE 32 MG IX SRER
32.0000 mg | Freq: Once | INTRA_ARTICULAR | Status: AC
  Administered 2024-08-18: 32 mg via INTRA_ARTICULAR

## 2024-08-18 NOTE — Telephone Encounter (Signed)
 Pt has a follow up on Oct 07, 2024 and will like for her Dr. to put in lab orders for b12 and iron to be checked before OCT 14th because she have an appt with her Nutritionist because she is taking two different iron pills. Pt and the Nutritionist just trying to make sure that she is not overdosing. Please advise, Thanks

## 2024-08-18 NOTE — Progress Notes (Unsigned)
   I, Leotis Batter, CMA acting as a scribe for Artist Lloyd, MD.  [name] is a 48 y.o. female who presents to [facility] today for exacerbation of her L knee pain. Pt was last seen by Dr. Lloyd on 05/04/24 and was given a repeat L knee Zilretta  injection.   Today, pt reports good relief of knee OA sx with last Zilretta  injection. Notes sx exacerbation over the past week. Would like repeat Zilretta  inj today.   Dx imaging: 04/20/23 L knee MRI 03/25/23 L knee XR  Pertinent review of systems: No fevers or chills  Relevant historical information: Hip arthritis anticipating hip replacement right hip.  Patient has successfully lost weight to get BMI under 40.   Exam:  BP 118/76   Pulse 84   Ht 5' 8 (1.727 m)   Wt 257 lb (116.6 kg)   SpO2 97%   BMI 39.08 kg/m  General: Well Developed, well nourished, and in no acute distress.   MSK: Left knee mild effusion normal-appearing otherwise normal motion.    Lab and Radiology Results   Zilretta  injection left knee Procedure: Real-time Ultrasound Guided Injection of left knee joint superior lateral patellar space Device: Philips Affiniti 50G Images permanently stored and available for review in PACS Verbal informed consent obtained.  Discussed risks and benefits of procedure. Warned about infection, hyperglycemia bleeding, damage to structures among others. Patient expresses understanding and agreement Time-out conducted.   Noted no overlying erythema, induration, or other signs of local infection.   Skin prepped in a sterile fashion.   Local anesthesia: Topical Ethyl chloride.   With sterile technique and under real time ultrasound guidance: Zilretta  32 mg injected into knee joint. Fluid seen entering the joint capsule.   Completed without difficulty   Advised to call if fevers/chills, erythema, induration, drainage, or persistent bleeding.   Images permanently stored and available for review  in the ultrasound unit.  Impression: Technically successful ultrasound guided injection.  Lot number: [id number]     Assessment and Plan: 48 y.o. female with left knee pain due to DJD.  Plan for Zilretta  injection today.  Can repeat this every 3 months if needed.  Additionally she has significant right hip arthritis and is in need of a hip replacement.  She has successfully lost weight to get her BMI under 40.  She was referred to orthopedic surgery last time but there was some difficulty with communication.  Provided the phone number so that she can call and get set up for a consultation.   PDMP not reviewed this encounter. Orders Placed This Encounter  Procedures   US  LIMITED JOINT SPACE STRUCTURES LOW LEFT(NO LINKED CHARGES)    Reason for Exam (SYMPTOM  OR DIAGNOSIS REQUIRED):   left knee pain / OA    Preferred imaging location?:   [facility] ordered this encounter  Medications   Triamcinolone  Acetonide (ZILRETTA ) intra-articular injection 32 mg     Discussed warning signs or symptoms. Please see discharge instructions. Patient expresses understanding.   The above documentation has been reviewed and is accurate and complete Artist Lloyd, M.D.

## 2024-08-18 NOTE — Patient Instructions (Signed)
 Thank you for coming in today.   You received an injection today. Seek immediate medical attention if the joint becomes red, extremely painful, or is oozing fluid.   [facility] (Dr. Vernetta): Phone: [phone]  Check back as needed.  Can repeat the Zilretta  injection in 3 months, just give us  a heads up to get it authorized.

## 2024-08-19 ENCOUNTER — Ambulatory Visit

## 2024-08-19 DIAGNOSIS — J4550 Severe persistent asthma, uncomplicated: Secondary | ICD-10-CM | POA: Diagnosis not present

## 2024-08-20 ENCOUNTER — Other Ambulatory Visit

## 2024-08-20 DIAGNOSIS — E611 Iron deficiency: Secondary | ICD-10-CM | POA: Diagnosis not present

## 2024-08-20 DIAGNOSIS — E538 Deficiency of other specified B group vitamins: Secondary | ICD-10-CM

## 2024-08-20 LAB — VITAMIN B12: Vitamin B-12: 450 pg/mL (ref 200–1100)

## 2024-08-20 LAB — FERRITIN: Ferritin: 28.3 ng/mL (ref 10.0–291.0)

## 2024-08-23 ENCOUNTER — Ambulatory Visit: Payer: Self-pay | Admitting: Nurse Practitioner

## 2024-08-24 ENCOUNTER — Other Ambulatory Visit: Payer: Self-pay | Admitting: Obstetrics and Gynecology

## 2024-08-24 DIAGNOSIS — L732 Hidradenitis suppurativa: Secondary | ICD-10-CM | POA: Diagnosis not present

## 2024-08-24 DIAGNOSIS — D219 Benign neoplasm of connective and other soft tissue, unspecified: Secondary | ICD-10-CM

## 2024-08-24 DIAGNOSIS — N939 Abnormal uterine and vaginal bleeding, unspecified: Secondary | ICD-10-CM

## 2024-08-24 DIAGNOSIS — Z79899 Other long term (current) drug therapy: Secondary | ICD-10-CM | POA: Diagnosis not present

## 2024-08-24 MED ORDER — ENSKYCE 0.15-30 MG-MCG PO TABS: 1.0000 | ORAL_TABLET | Freq: Every day | ORAL | 11 refills | Status: AC

## 2024-08-25 NOTE — Progress Notes (Unsigned)
 Subjective:    Patient ID: [name], female    DOB: 01-Sep-1976, 48 y.o.   MRN: [id number]  HPI: [name] is a 48 y.o. female who returns for follow up appointment for chronic pain and medication refill. She states her pain is located in her lower back radiating into her right hip and left knee pain. She rates her pain 8. Her current exercise regime is walking and performing stretching exercises.  Paulene L Dilley Morphine equivalent is 30.00 MME.   Oral Swab was Performed today.      Pain Inventory Average Pain 9 Pain Right Now 8 My pain is sharp, burning, dull, stabbing, and tingling  In the last 24 hours, has pain interfered with the following? General activity 10 Relation with others 10 Enjoyment of life 9 What TIME of day is your pain at its worst? night Sleep (in general) Poor  Pain is worse with: walking, sitting, inactivity, and standing Pain improves with: heat/ice and medication Relief from Meds: 4  Family History  Problem Relation Age of Onset   Diabetes Mother    Hypertension Mother    Diabetes Father    Asthma Other    Hyperlipidemia Other    Hypertension Other    Colon cancer Neg Hx    Esophageal cancer Neg Hx    Rectal cancer Neg Hx    Stomach cancer Neg Hx    Breast cancer Neg Hx    BRCA 1/2 Neg Hx    Social History   Socioeconomic History   Marital status: Single    Spouse name: Not on file   Number of children: Not on file   Years of education: Not on file   Highest education level: 11th grade  Occupational History   Occupation: disabled  Tobacco Use   Smoking status: Former    Current packs/day: 0.33    Types: Cigarettes    Passive exposure: Current   Smokeless tobacco: Never  Vaping Use   Vaping status: Never Used  Substance and Sexual Activity   Alcohol use: Not Currently   Drug use: No   Sexual activity: Yes    Birth control/protection: Surgical, Pill  Other Topics Concern   Not on file  Social History  Narrative   Lives with her daughter   Social Drivers of Health   Financial Resource Strain: Low Risk  (02/20/2024)   Overall Financial Resource Strain (CARDIA)    Difficulty of Paying Living Expenses: Not hard at all  Food Insecurity: No Food Insecurity (03/11/2024)   Hunger Vital Sign    Worried About Running Out of Food in the Last Year: Never true    Ran Out of Food in the Last Year: Never true  Transportation Needs: No Transportation Needs (03/11/2024)   PRAPARE - Administrator, Civil Service (Medical): No    Lack of Transportation (Non-Medical): No  Physical Activity: Inactive (02/20/2024)   Exercise Vital Sign    Days of Exercise per Week: 0 days    Minutes of Exercise per Session: 0 min  Stress: Stress Concern Present (02/20/2024)   [employer] of Occupational Health - Occupational Stress Questionnaire    Feeling of Stress : To some extent  Social Connections: Moderately Isolated (03/09/2024)   Social Connection and Isolation Panel    Frequency of Communication with Friends and Family: More than three times a week    Frequency of Social Gatherings with Friends and Family: More than three times a week  Attends Religious Services: More than 4 times per year    Active Member of Clubs or Organizations: No    Attends Banker Meetings: Never    Marital Status: Divorced   Past Surgical History:  Procedure Laterality Date   CESAREAN SECTION     x2   HIATAL HERNIA REPAIR N/A 03/09/2024   Procedure: REPAIR, HERNIA, HIATAL;  Surgeon: Signe Mitzie LABOR, MD;  Location: [facility];  Service: General;  Laterality: N/A;   LAPAROSCOPIC GASTRIC SLEEVE RESECTION N/A 03/09/2024   Procedure: GASTRECTOMY, SLEEVE, LAPAROSCOPIC;  Surgeon: Signe Mitzie LABOR, MD;  Location: [facility];  Service: General;  Laterality: N/A;   TUBAL LIGATION     UPPER GI ENDOSCOPY N/A 03/09/2024   Procedure: ENDOSCOPY, UPPER GI TRACT;  Surgeon: Signe Mitzie LABOR, MD;  Location: [facility];   Service: General;  Laterality: N/A;   WISDOM TOOTH EXTRACTION  2011   Past Surgical History:  Procedure Laterality Date   CESAREAN SECTION     x2   HIATAL HERNIA REPAIR N/A 03/09/2024   Procedure: REPAIR, HERNIA, HIATAL;  Surgeon: Signe Mitzie LABOR, MD;  Location: [facility];  Service: General;  Laterality: N/A;   LAPAROSCOPIC GASTRIC SLEEVE RESECTION N/A 03/09/2024   Procedure: GASTRECTOMY, SLEEVE, LAPAROSCOPIC;  Surgeon: Signe Mitzie LABOR, MD;  Location: [facility];  Service: General;  Laterality: N/A;   TUBAL LIGATION     UPPER GI ENDOSCOPY N/A 03/09/2024   Procedure: ENDOSCOPY, UPPER GI TRACT;  Surgeon: Signe Mitzie LABOR, MD;  Location: [facility];  Service: General;  Laterality: N/A;   WISDOM TOOTH EXTRACTION  2011   Past Medical History:  Diagnosis Date   Arthritis    Asthma    Diverticulitis of colon    GERD (gastroesophageal reflux disease)    Heart murmur    Hydradenitis    Iron (Fe) deficiency anemia    MRSA (methicillin resistant staph aureus) culture positive    Obesity    BP 134/84 (BP Location: Left Arm, Patient Position: Sitting, Cuff Size: Large)   Pulse 65   Ht 5' 8 (1.727 m)   Wt 249 lb 12.8 oz (113.3 kg)   BMI 37.98 kg/m   Opioid Risk Score:   Fall Risk Score:  `1  Depression screen [facility] 2/9     08/26/2024    8:53 AM 06/07/2024    2:22 PM 05/26/2024    9:10 AM 03/25/2024    8:54 AM 02/20/2024    9:38 AM 12/15/2023    9:29 AM 12/01/2023    9:35 AM  Depression screen PHQ 2/9  Decreased Interest 0 0 0 0 0 0 0  Down, Depressed, Hopeless 0 0 0  0 0 0  PHQ - 2 Score 0 0 0 0 0 0 0  Altered sleeping 0 3   0    Tired, decreased energy 0 3   0    Change in appetite 0 3   0    Feeling bad or failure about yourself   3   0    Trouble concentrating 0 0   0    Moving slowly or fidgety/restless 0 0   0    Suicidal thoughts 0 0   0    PHQ-9 Score 0 12   0    Difficult doing work/chores     Not difficult at all      Review of Systems  Musculoskeletal:  Positive for  arthralgias and myalgias.       Right hip  pain, left knee pain  All other systems reviewed and are negative.      Objective:   Physical Exam Vitals and nursing note reviewed.  Constitutional:      Appearance: Normal appearance. She is obese.  Cardiovascular:     Rate and Rhythm: Normal rate and regular rhythm.     Pulses: Normal pulses.     Heart sounds: Normal heart sounds.  Pulmonary:     Effort: Pulmonary effort is normal.     Breath sounds: Normal breath sounds.  Musculoskeletal:     Comments: Normal Muscle Bulk and Muscle Testing Reveals:  Upper Extremities: Full ROM and Muscle Strength 5/5 Lumbar Paraspinal Tenderness: L-4-L-5 Lower Extremities: Right: Decreased ROM and Muscle Strength 5/5 Right Lower extremity Flexion Produces Pain into her Right Hip and Right Lower Extremity Left Lower Extremity : Full ROM and Muscle Strength 5/5 Arises from Chair slowly using cane for support Antalgic  Gait     Skin:    General: Skin is warm and dry.  Neurological:     Mental Status: She is alert and oriented to person, place, and time.  Psychiatric:        Mood and Affect: Mood normal.        Behavior: Behavior normal.          Assessment & Plan:  Primary  Osteoarthritis of Right Hip: Continue HEP as Tolerated. Continue current medication regimen/ Continue to Monitor.She has a scheduled appointment with Dr Vernetta in October. 08/26/2024 Chronic Pain of Left Knee : Ortho Following. Continue to Monitor. Continue HEP as Tolerated. 08/26/2024 Acute Left Ankle Pain: No complaints today. Ortho following.  Continue to Monitor. 08/26/2024. Morbid Obesity: S/P:  GASTRECTOMY, SLEEVE, LAPAROSCOPIC N/A General  ENDOSCOPY, UPPER GI TRACT N/A General  REPAIR, HERNIA, HIATAL      Continue Healthy Diet Regimen. Continue to Monitor. 08/26/2024. Chronic Pain Syndrome: Refilled: Oxycodone  5-325 mg 1-2 two tablets twice a day as needed for pain #120. Second script sent for the following  month. We will continue the opioid monitoring program, this consists of regular clinic visits, examinations, urine drug screen, pill counts as well as use of [place]  Controlled Substance Reporting system. A 12 month History has been reviewed on the [place]  Controlled Substance Reporting System on 08/26/2024   F/U in 2 months

## 2024-08-26 ENCOUNTER — Encounter: Payer: Self-pay | Admitting: Registered Nurse

## 2024-08-26 ENCOUNTER — Encounter: Attending: Physical Medicine and Rehabilitation | Admitting: Registered Nurse

## 2024-08-26 VITALS — BP 134/84 | HR 65 | Ht 68.0 in | Wt 249.8 lb

## 2024-08-26 DIAGNOSIS — G8929 Other chronic pain: Secondary | ICD-10-CM | POA: Insufficient documentation

## 2024-08-26 DIAGNOSIS — G894 Chronic pain syndrome: Secondary | ICD-10-CM | POA: Diagnosis not present

## 2024-08-26 DIAGNOSIS — Z5181 Encounter for therapeutic drug level monitoring: Secondary | ICD-10-CM | POA: Insufficient documentation

## 2024-08-26 DIAGNOSIS — M5416 Radiculopathy, lumbar region: Secondary | ICD-10-CM | POA: Insufficient documentation

## 2024-08-26 DIAGNOSIS — Z79891 Long term (current) use of opiate analgesic: Secondary | ICD-10-CM | POA: Diagnosis not present

## 2024-08-26 DIAGNOSIS — M25562 Pain in left knee: Secondary | ICD-10-CM | POA: Diagnosis not present

## 2024-08-26 MED ORDER — OXYCODONE-ACETAMINOPHEN 5-325 MG PO TABS: 1.0000 | ORAL_TABLET | Freq: Two times a day (BID) | ORAL | 0 refills | Status: DC

## 2024-08-26 MED ORDER — OXYCODONE-ACETAMINOPHEN 5-325 MG PO TABS: 1.0000 | ORAL_TABLET | Freq: Two times a day (BID) | ORAL | 0 refills | Status: AC

## 2024-08-30 LAB — DRUG TOX MONITOR 1 W/CONF, ORAL FLD
Amphetamines: NEGATIVE ng/mL (ref <10)
Barbiturates: NEGATIVE ng/mL (ref <10)
Benzodiazepines: NEGATIVE ng/mL (ref <0.50)
Buprenorphine: NEGATIVE ng/mL (ref <0.10)
Cocaine: NEGATIVE ng/mL (ref <5.0)
Codeine: NEGATIVE ng/mL (ref <2.5)
Cotinine: >250 ng/mL — ABNORMAL HIGH (ref <5.0)
Dihydrocodeine: NEGATIVE ng/mL (ref <2.5)
Fentanyl: NEGATIVE ng/mL (ref <0.10)
Heroin Metabolite: NEGATIVE ng/mL (ref <1.0)
Hydrocodone: NEGATIVE ng/mL (ref <2.5)
Hydromorphone: NEGATIVE ng/mL (ref <2.5)
MARIJUANA: NEGATIVE ng/mL (ref <2.5)
MDMA: NEGATIVE ng/mL (ref <10)
Meprobamate: NEGATIVE ng/mL (ref <2.5)
Methadone: NEGATIVE ng/mL (ref <5.0)
Morphine: NEGATIVE ng/mL (ref <2.5)
Nicotine Metabolite: POSITIVE ng/mL — AB (ref <5.0)
Norhydrocodone: NEGATIVE ng/mL (ref <2.5)
Noroxycodone: 6.2 ng/mL — ABNORMAL HIGH (ref <2.5)
Opiates: POSITIVE ng/mL — AB (ref <2.5)
Oxycodone: >250 ng/mL — ABNORMAL HIGH (ref <2.5)
Oxymorphone: NEGATIVE ng/mL (ref <2.5)
Phencyclidine: NEGATIVE ng/mL (ref <10)
Tapentadol: NEGATIVE ng/mL (ref <5.0)
Tramadol: NEGATIVE ng/mL (ref <5.0)
Zolpidem: NEGATIVE ng/mL (ref <5.0)

## 2024-08-30 LAB — DRUG TOX ALC METAB W/CON, ORAL FLD: Alcohol Metabolite: NEGATIVE ng/mL (ref <25)

## 2024-09-07 ENCOUNTER — Encounter: Attending: Surgery | Admitting: Skilled Nursing Facility1

## 2024-09-07 ENCOUNTER — Other Ambulatory Visit: Payer: Self-pay | Admitting: Allergy & Immunology

## 2024-09-07 DIAGNOSIS — Z6837 Body mass index (BMI) 37.0-37.9, adult: Secondary | ICD-10-CM | POA: Diagnosis not present

## 2024-09-07 DIAGNOSIS — Z713 Dietary counseling and surveillance: Secondary | ICD-10-CM | POA: Insufficient documentation

## 2024-09-07 DIAGNOSIS — E669 Obesity, unspecified: Secondary | ICD-10-CM | POA: Diagnosis present

## 2024-09-08 NOTE — Progress Notes (Signed)
 Follow-up visit:  Post-Operative sleeve Surgery  Medical Nutrition Therapy:  Appt start time: 5:15 end time:  6:30pm  Primary concerns today: Post-operative Bariatric Surgery Nutrition Management 6 Month Post-Op Class  Surgery date: 03/09/2024 Surgery type: Sleeve Gastrectomy  NUTRITION ASSESSMENT  Anthropometrics  Start weight at [facility]: 324.7 lbs (date: 06/09/2023)  Height: 68 in Weight today: 247.1 lb  Clinical   Pharmacotherapy: History of weight loss medication used: zepbound  has not been approved Medical hx: reflux, asthma Medications: see list   Labs: LDL 126; iron 32; RDW 18.9; A1c 6.1 Notable signs/symptoms: none noted Any previous deficiencies? No Bowel Habits: Every day to every other day no complaints   Body Composition Scale 03/23/2024 06/08/2024 09/08/2024  Current Body Weight 284.1 268.8 247.1  Total Body Fat % 47.2 45.7 43.8  Visceral Fat 16 15 13   Fat-Free Mass % 52.7 54.2 56.1   Total Body Water  % 40.8 41.6 42.5  Muscle-Mass lbs 33.4 33.6 33.3  BMI 43.2 40.9 37.5  Body Fat Displacement            Torso  lbs 83.2 76.3 67.1         Left Leg  lbs 16.6 15.2 13.4         Right Leg  lbs 16.6 15.2 13.4         Left Arm  lbs 8.3 7.6 6.7         Right Arm  lbs 8.3 7.6 6.7     Information Reviewed/ Discussed During Appointment: -Review of composition scale numbers -Fluid requirements (64-100 ounces) -Protein requirements (60-80g) -Strategies for tolerating diet -Advancement of diet to include Starchy vegetables -Barriers to inclusion of new foods -Inclusion of appropriate multivitamin and calcium supplements  -Exercise recommendations   Fluid intake: adequate   Medications: See List Supplementation: appropriate    Using straws: no Drinking while eating: no Having you been chewing well: yes Chewing/swallowing difficulties: no Changes in vision: no Changes to mood/headaches: no Hair loss/Cahnges to skin/Changes to nails: no Any difficulty  focusing or concentrating: no Sweating: no Dizziness/Lightheaded: no Palpitations: no  Carbonated beverages: no N/V/D/C/GAS: no Abdominal Pain: no Dumping syndrome: no  Recent physical activity:  ADL's  Progress Towards Goal(s):  In Progress Teaching method utilized: Visual & Auditory  Demonstrated degree of understanding via: Teach Back  Readiness Level: Action Barriers to learning/adherence to lifestyle change: none identified  Handouts given during visit include: Phase V diet Progression  Goals Sheet The Benefits of Exercise are endless..... Support Group Topics   Teaching Method Utilized:  Visual Auditory Hands on  Demonstrated degree of understanding via:  Teach Back   Monitoring/Evaluation:  Dietary intake, exercise, and body weight. Follow up in 3 months for 9 month post-op visit.

## 2024-09-16 ENCOUNTER — Ambulatory Visit: Admitting: Orthopaedic Surgery

## 2024-09-16 ENCOUNTER — Ambulatory Visit (INDEPENDENT_AMBULATORY_CARE_PROVIDER_SITE_OTHER)

## 2024-09-16 ENCOUNTER — Other Ambulatory Visit: Payer: Self-pay

## 2024-09-16 VITALS — Ht 67.72 in | Wt 250.4 lb

## 2024-09-16 DIAGNOSIS — M1611 Unilateral primary osteoarthritis, right hip: Secondary | ICD-10-CM

## 2024-09-16 DIAGNOSIS — J4550 Severe persistent asthma, uncomplicated: Secondary | ICD-10-CM | POA: Diagnosis not present

## 2024-09-16 DIAGNOSIS — G8929 Other chronic pain: Secondary | ICD-10-CM | POA: Diagnosis not present

## 2024-09-16 DIAGNOSIS — M25551 Pain in right hip: Secondary | ICD-10-CM

## 2024-09-16 NOTE — Progress Notes (Signed)
 The patient is a very pleasant 48 year old who was sent to me from Dr. 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  She is someone who has been on a significant weight loss journey.  She says that her highest weight she was around 400 pounds.  Today even in the office she is down to 250 pounds with a BMI of only 38.39.  She does report significant and severe daily right hip pain but also left knee pain.  She has had injections in both.  She has also had a gastric sleeve surgery.  She says the groin pain is quite a bit with that right side and it does lock and catch on her.  I was able to look at all of her medications and past medical history within epic.  She has tried and failed conservative treatment at this point and her right hip pain is daily and it is definitely affecting her mobility, her quality of life and her actives daily living.  On exam her left hip moves smoothly and fluidly but her right hip is significantly painful with any rotation of the hip with tightness of rotation as well.  An x-ray of the pelvis and right hip shows severe end-stage bone-on-bone arthritis of the right hip with protrusio as well.  The left hip appears normal but the right hip is significant osteophytes around it as well as sclerotic changes and joint space narrowing.  We had a long and thorough discussion about hip replacement surgery.  We discussed the risks and benefits of the surgery and what to expect from an intraoperative and postoperative standpoint.  I share with her x-rays and a hip replacement model and gave her handout about hip replacement surgery.  She would like to have this sometime after January 9 after the holiday season.  We will be in touch with scheduling the surgery.

## 2024-09-17 ENCOUNTER — Other Ambulatory Visit (HOSPITAL_COMMUNITY): Payer: Self-pay

## 2024-09-18 ENCOUNTER — Other Ambulatory Visit (HOSPITAL_COMMUNITY): Payer: Self-pay

## 2024-09-20 ENCOUNTER — Telehealth: Payer: Self-pay | Admitting: Orthopaedic Surgery

## 2024-09-20 NOTE — Telephone Encounter (Signed)
 Pt would like to go ahead and schedule surgery ASAP.

## 2024-09-25 ENCOUNTER — Other Ambulatory Visit: Payer: Self-pay | Admitting: Internal Medicine

## 2024-09-27 ENCOUNTER — Encounter: Payer: Self-pay | Admitting: Radiology

## 2024-09-28 NOTE — Telephone Encounter (Signed)
 I called patient and set surgery date for 11/02/24.

## 2024-09-30 ENCOUNTER — Ambulatory Visit: Admitting: Allergy & Immunology

## 2024-09-30 ENCOUNTER — Encounter: Payer: Self-pay | Admitting: Allergy & Immunology

## 2024-09-30 ENCOUNTER — Other Ambulatory Visit: Payer: Self-pay

## 2024-09-30 VITALS — BP 118/64 | Temp 98.0°F | Resp 18 | Ht 66.93 in | Wt 240.7 lb

## 2024-09-30 DIAGNOSIS — Z91013 Allergy to seafood: Secondary | ICD-10-CM | POA: Diagnosis not present

## 2024-09-30 DIAGNOSIS — L732 Hidradenitis suppurativa: Secondary | ICD-10-CM

## 2024-09-30 DIAGNOSIS — J4550 Severe persistent asthma, uncomplicated: Secondary | ICD-10-CM

## 2024-09-30 DIAGNOSIS — J3089 Other allergic rhinitis: Secondary | ICD-10-CM | POA: Diagnosis not present

## 2024-09-30 NOTE — Patient Instructions (Addendum)
 1. Moderate persistent asthma, uncomplicated - Lung testing not done today. - Continue with the Tezspire  since this is working so well.  - Daily controller medication(s): Trelegy 200/62.5/25 one puff once daily and Tezspire  every month - Prior to physical activity: albuterol  2 puffs 10-15 minutes before physical activity. - Rescue medications: albuterol  4 puffs every 4-6 hours as needed - Asthma control goals:  * Full participation in all desired activities (may need albuterol  before activity) * Albuterol  use two time or less a week on average (not counting use with activity) * Cough interfering with sleep two time or less a month * Oral steroids no more than once a year * No hospitalizations  2. Anaphylactic shock due to food (shellfish) - EpiPen  refilled.  - Labs sent today.    3. Perennial and seasonal allergic rhinitis (grasses, weeds, ragweed, trees, molds, cat, dog, cockroach, and dust mite) - Continue with fluticasone  nasal spray two sprays per nostril daily EVERY DAY (brown bottle).  - Continue with levocetirizine 5mg  tablet once daily.  - Continue montelukast  (Singulair ) 10mg  daily.    4. Suppurativa hiadrenitis - Continue following with Dr. Jackquline.  5. Return in about 6 months (around 03/30/2025). You can have the follow up appointment with Dr. Iva or a Nurse Practicioner (our Nurse Practitioners are excellent and always have Physician oversight!).    Please inform us  of any Emergency Department visits, hospitalizations, or changes in symptoms. Call us  before going to the ED for breathing or allergy symptoms since we might be able to fit you in for a sick visit. Feel free to contact us  anytime with any questions, problems, or concerns.  It was a pleasure to see you again today!  Websites that have reliable patient information: 1. American Academy of Asthma, Allergy, and Immunology: www.aaaai.org 2. Food Allergy Research and Education (FARE): foodallergy.org 3.  Mothers of Asthmatics: http://www.asthmacommunitynetwork.org 4. American College of Allergy, Asthma, and Immunology: www.acaai.org      "Like" us  on Facebook and Instagram for our latest updates!      A healthy democracy works best when [employer] participate! Make sure you are registered to vote! If you have moved or changed any of your contact information, you will need to get this updated before voting! Scan the QR codes below to learn more!

## 2024-09-30 NOTE — Progress Notes (Unsigned)
 FOLLOW UP  Date of Service/Encounter:  09/30/24   Assessment:   Seasonal and perennial allergic rhinitis (grasses, weeds, ragweed, trees, molds, cat, dog, cockroach, and dust mite)   Moderate persistent asthma, uncomplicated   Anaphylactic shock due to food (shellfish)   Hidradenitis suppurativa - on infliximab   Plan/Recommendations:   1. Moderate persistent asthma, uncomplicated - Lung testing not done today. - Continue with the Tezspire  since this is working so well.  - Daily controller medication(s): Trelegy 200/62.5/25 one puff once daily and Tezspire  every month - Prior to physical activity: albuterol  2 puffs 10-15 minutes before physical activity. - Rescue medications: albuterol  4 puffs every 4-6 hours as needed - Asthma control goals:  * Full participation in all desired activities (may need albuterol  before activity) * Albuterol  use two time or less a week on average (not counting use with activity) * Cough interfering with sleep two time or less a month * Oral steroids no more than once a year * No hospitalizations  2. Anaphylactic shock due to food (shellfish) - EpiPen  refilled.  - Labs sent today.    3. Perennial and seasonal allergic rhinitis (grasses, weeds, ragweed, trees, molds, cat, dog, cockroach, and dust mite) - Continue with fluticasone  nasal spray two sprays per nostril daily EVERY DAY (brown bottle).  - Continue with levocetirizine 5mg  tablet once daily.  - Continue montelukast  (Singulair ) 10mg  daily.    4. Suppurativa hiadrenitis - Continue following with Dr. Jackquline.  5. Return in about 6 months (around 03/30/2025). You can have the follow up appointment with Dr. Iva or a Nurse Practicioner (our Nurse Practitioners are excellent and always have Physician oversight!).   Subjective:   [name] is a 48 y.o. female presenting today for follow up of No chief complaint on file.   [name] has a history of the  following: Patient Active Problem List   Diagnosis Date Noted   Abrasion of abdominal wall 04/02/2024   Diabetes mellitus screening 04/02/2024   Vitamin B12 deficiency 12/15/2023   Constipation 10/31/2023   Acute left ankle pain 10/31/2023   Numbness and tingling of right leg 10/05/2023   Rash 08/01/2023   Abdominal pain 06/06/2023   Iron deficiency 06/06/2023   Murmur 06/06/2023   Fibroids 05/13/2023   Abnormal uterine bleeding 04/25/2023   Chronic pain of left knee 03/31/2023   Class 3 severe obesity due to excess calories with body mass index (BMI) of 45.0 to 49.9 in adult [facility]) 03/21/2023   Hidradenitis suppurativa 03/21/2023   Right lumbar radiculopathy 03/03/2023   Morbid obesity (HCC) 03/03/2023   Chronic pain syndrome 03/03/2023   Encounter for long-term opiate analgesic use 01/29/2023   Unilateral primary osteoarthritis, right hip 01/21/2023   Graves disease 07/06/2019   Hyperthyroidism 07/06/2019   Tobacco use disorder 05/26/2019   Perennial and seasonal allergic rhinitis 08/19/2017   Moderate persistent asthma without complication 08/19/2017    History obtained from: chart review and patient.  Discussed the use of AI scribe software for clinical note transcription with the patient and/or guardian, who gave verbal consent to proceed.  [name] is a 48 y.o. female presenting for a follow up visit. She was last seen in May 2025. At that time, her lung testing looked great. We continued with Trelegy one puff once daily as well as Tezspire  monthly. For her rhinitis, we continued with the fluticasone  and the levocetirizine as well as the montelukast . We did start her on prednisone  for a flare. For her HS,  she continued to follow with Dr. Jackquline.   Since the last visit, she has done well.  She has hip surgery scheduled for this month, which may impact her ability to attend appointments next month. She has previously undergone gastroesophageal surgery and has experienced  significant weight loss since then.  Asthma/Respiratory Symptom History: She is currently using Tezspire  for her asthma injections and takes Trelegy regularly. She uses her rescue inhaler, albuterol , as needed, particularly when she feels a flare-up coming on, usually the day before her scheduled injection. Her medication use has decreased, but she still has it available when needed. She has not been on prednisone  and she has not been to the ED for her symptoms.   Allergic Rhinitis Symptom History: She remains on the Flonase  as well as Xyzal  and Singulair . This seems to be working well to control her symptoms. She has not been on antibiotics for any sinus or ear infections.   Food Allergy Symptom History: She wants to retest her shellfish allergy, noting that her previous tests showed a decrease in her crab allergy to mild, although her shrimp allergy remains severe. She has not received her allergy medications in the mail for about four months.  Skin Symptom History: She receives monthly infusions at a clinic on [address] for her HS. This is much better controlled at this time and no longer causes pain. She attributes flare-ups to stress. She continues to follow with Dr. Jackquline. She has also seen [name], a PA with [facility]. She recently had her infliximab  increased to 10mg /kg every 6 weeks. She receives these at [facility] in [place].   She has previously tried and failed: cosentyx , doxycyline (yeast infections), topical clindamycin .  GERD Symptom History: She continues to use Nexium  40 mg daily for reflux control. Losing weight has helped a lot with her symptoms as well.   Otherwise, there have been no changes to her past medical history, surgical history, family history, or social history.    Review of systems otherwise negative other than that mentioned in the HPI.    Objective:   Blood pressure 118/64, temperature 98 F (36.7 C), temperature source  Temporal, resp. rate 18, height 5' 6.93 (1.7 m), weight 240 lb 11.2 oz (109.2 kg). Body mass index is 37.78 kg/m.    Physical Exam Vitals reviewed.  Constitutional:      Appearance: She is well-developed.     Comments: Lost a lot of weight since I saw her last time.   HENT:     Head: Normocephalic and atraumatic.     Right Ear: Tympanic membrane, ear canal and external ear normal.     Left Ear: Tympanic membrane, ear canal and external ear normal.     Nose: No nasal deformity, septal deviation, mucosal edema or rhinorrhea.     Right Turbinates: Enlarged, swollen and pale.     Left Turbinates: Enlarged, swollen and pale.     Right Sinus: No maxillary sinus tenderness or frontal sinus tenderness.     Left Sinus: No maxillary sinus tenderness or frontal sinus tenderness.     Comments: No nasal polyps noted.     Mouth/Throat:     Lips: Pink.     Mouth: Mucous membranes are moist. Mucous membranes are not pale and not dry.     Pharynx: Uvula midline.     Comments: Cobblestoning present in the posterior oropharynx. Eyes:     General: Allergic shiner present.  Right eye: No discharge.        Left eye: No discharge.     Conjunctiva/sclera: Conjunctivae normal.     Right eye: Right conjunctiva is not injected. No chemosis.    Left eye: Left conjunctiva is not injected. No chemosis.    Pupils: Pupils are equal, round, and reactive to light.  Cardiovascular:     Rate and Rhythm: Normal rate and regular rhythm.     Heart sounds: Normal heart sounds.  Pulmonary:     Effort: Pulmonary effort is normal. No tachypnea, accessory muscle usage or respiratory distress.     Breath sounds: Normal breath sounds. No wheezing, rhonchi or rales.     Comments: Moving air well in all lung fields. No increased work of breathing noted.  Chest:     Chest wall: No tenderness.  Lymphadenopathy:     Cervical: No cervical adenopathy.  Skin:    General: Skin is warm.     Capillary Refill: Capillary  refill takes less than 2 seconds.     Coloration: Skin is not pale.     Findings: Rash present. No abrasion, erythema or petechiae. Rash is not papular, urticarial or vesicular.     Comments: She has some hyperkeratosis in her right breast, but there is no oozing as there was last time. There is some crusting, but no drainage.  Neurological:     Mental Status: She is alert.  Psychiatric:        Behavior: Behavior is cooperative.      Diagnostic studies: none      Marty Shaggy, MD  [facility] 

## 2024-10-04 LAB — ALLERGEN PROFILE, SHELLFISH
Clam IgE: 2.11 kU/L — AB
F023-IgE Crab: 2.52 kU/L — AB
F080-IgE Lobster: 2.94 kU/L — AB
F290-IgE Oyster: 0.62 kU/L — AB
Scallop IgE: 3.49 kU/L — AB
Shrimp IgE: 6.44 kU/L — AB

## 2024-10-04 LAB — TRYPTASE: Tryptase: 2.5 ug/L (ref 2.2–13.2)

## 2024-10-05 ENCOUNTER — Other Ambulatory Visit: Payer: Self-pay | Admitting: Allergy & Immunology

## 2024-10-05 DIAGNOSIS — J3089 Other allergic rhinitis: Secondary | ICD-10-CM

## 2024-10-06 ENCOUNTER — Encounter: Payer: Self-pay | Admitting: Allergy & Immunology

## 2024-10-07 ENCOUNTER — Ambulatory Visit

## 2024-10-07 ENCOUNTER — Ambulatory Visit: Admitting: Podiatry

## 2024-10-07 ENCOUNTER — Ambulatory Visit: Admitting: Nurse Practitioner

## 2024-10-07 VITALS — BP 130/72 | HR 63 | Temp 97.6°F | Ht 66.0 in | Wt 243.4 lb

## 2024-10-07 DIAGNOSIS — M21611 Bunion of right foot: Secondary | ICD-10-CM

## 2024-10-07 DIAGNOSIS — E66813 Obesity, class 3: Secondary | ICD-10-CM

## 2024-10-07 DIAGNOSIS — E611 Iron deficiency: Secondary | ICD-10-CM

## 2024-10-07 DIAGNOSIS — Z6841 Body Mass Index (BMI) 40.0 and over, adult: Secondary | ICD-10-CM

## 2024-10-07 DIAGNOSIS — M21612 Bunion of left foot: Secondary | ICD-10-CM

## 2024-10-07 DIAGNOSIS — Z23 Encounter for immunization: Secondary | ICD-10-CM

## 2024-10-07 DIAGNOSIS — M21621 Bunionette of right foot: Secondary | ICD-10-CM | POA: Diagnosis not present

## 2024-10-07 DIAGNOSIS — M21619 Bunion of unspecified foot: Secondary | ICD-10-CM | POA: Diagnosis not present

## 2024-10-07 DIAGNOSIS — M21622 Bunionette of left foot: Secondary | ICD-10-CM | POA: Diagnosis not present

## 2024-10-07 DIAGNOSIS — M1611 Unilateral primary osteoarthritis, right hip: Secondary | ICD-10-CM | POA: Diagnosis not present

## 2024-10-07 MED ORDER — FERROUS SULFATE 325 (65 FE) MG PO TABS: 325.0000 mg | ORAL_TABLET | Freq: Every day | ORAL | 3 refills | Status: AC

## 2024-10-07 NOTE — Progress Notes (Signed)
 Established Patient Office Visit  Subjective   Patient ID: [name], female    DOB: 05/20/1976  Age: 48 y.o. MRN: [id number]  Chief Complaint  Patient presents with   Obesity    Discussed the use of AI scribe software for clinical note transcription with the patient, who gave verbal consent to proceed.  History of Present Illness [name] is a 48 year old female with severe osteoarthritis who presents for follow-up after laparoscopic gastric sleeve surgery.  Postoperative status following bariatric surgery - Status post laparoscopic gastric sleeve surgery - Weight loss of approximately 103 pounds since surgery - Increased energy levels - Improved asthma symptoms  Chronic joint pain and functional impairment - Severe osteoarthritis with worsening joint symptoms despite significant weight loss - Increased pain and difficulty ambulating, particularly in the right hip and left knee - Severe right hip pain causing forward falls when standing - Upcoming total hip replacement next month  Anemia - Iron supplement reduced to once daily due to constipation - Recent blood work shows normal iron and ferritin levels  Physical activity and rehabilitation - Initiated gym routine and pool exercises to improve mobility and strength - Coordinating living arrangements and family support for postoperative recovery  Bilateral foot pain - Located to foot pads and MTP joints bilaterally       ROS: See HPI    Objective:     BP 130/72   Pulse 63   Temp 97.6 F (36.4 C) (Temporal)   Ht 5' 6 (1.676 m)   Wt 243 lb 6 oz (110.4 kg)   SpO2 94%   BMI 39.28 kg/m  BP Readings from Last 3 Encounters:  10/07/24 130/72  09/30/24 118/64  08/26/24 134/84   Wt Readings from Last 3 Encounters:  10/07/24 243 lb 6 oz (110.4 kg)  09/30/24 240 lb 11.2 oz (109.2 kg)  09/16/24 250 lb 6.4 oz (113.6 kg)      Physical Exam Vitals reviewed.  Constitutional:      General:  She is not in acute distress.    Appearance: Normal appearance.  HENT:     Head: Normocephalic and atraumatic.  Cardiovascular:     Rate and Rhythm: Normal rate and regular rhythm.     Pulses: Normal pulses.     Heart sounds: Normal heart sounds.  Pulmonary:     Effort: Pulmonary effort is normal.     Breath sounds: Normal breath sounds.  Feet:     Comments: Bilateral bunions and callus formation to foot pads Skin:    General: Skin is warm and dry.  Neurological:     General: No focal deficit present.     Mental Status: She is alert and oriented to person, place, and time.  Psychiatric:        Mood and Affect: Mood normal.        Behavior: Behavior normal.        Judgment: Judgment normal.      No results found for any visits on 10/07/24.    The 10-year ASCVD risk score (Arnett DK, et al., 2019) is: 1.8%    Assessment & Plan:   Problem List Items Addressed This Visit       Musculoskeletal and Integument   Unilateral primary osteoarthritis, right hip - Primary   Severe osteoarthritis of right hip and left knee with impaired mobility and ADLs Severe osteoarthritis in the right hip and left knee causing significant pain and impaired mobility. Planning for total right  hip replacement surgery within the next month. Difficulty with ADLs such as bathing and dressing due to pain and mobility issues. Requesting a bedside commode due to difficulty with toilet use. - Ordered referral to home health for evaluation and potential therapy services. - Ordered prescription for bedside commode to assist with toilet use. - Plan for total right hip replacement surgery within the next month.      Relevant Orders   Ambulatory referral to Home Health   For home use only DME Other see comment   Bunion   Relevant Orders   Ambulatory referral to Podiatry     Other   Class 3 severe obesity due to excess calories with body mass index (BMI) of 45.0 to 49.9 in adult [facility])   Status post  laparoscopic gastric sleeve with class 3 severe obesity, significant weight loss, and ongoing nutritional supplementation Status post laparoscopic gastric sleeve with significant weight loss of approximately 103 pounds. Ongoing nutritional supplementation with multivitamins and vitamin D . Blood pressure is well-managed. - Continue multivitamin and vitamin D  supplementation.      Iron deficiency    Iron deficiency, currently supplemented and monitored Iron deficiency with normal iron and ferritin levels as of last serum check. Currently taking iron supplements once daily due to previous constipation issues with higher dosage. - Continue iron supplementation once daily.      Relevant Medications   ferrous sulfate  (FEROSUL) 325 (65 FE) MG tablet  Assessment and Plan Assessment & Plan Severe osteoarthritis of right hip and left knee with impaired mobility and ADLs Severe osteoarthritis in the right hip and left knee causing significant pain and impaired mobility. Planning for total right hip replacement surgery within the next month. Difficulty with ADLs such as bathing and dressing due to pain and mobility issues. Requesting a bedside commode due to difficulty with toilet use. - Ordered referral to home health for evaluation and potential therapy services. - Ordered prescription for bedside commode to assist with toilet use. - Plan for total right hip replacement surgery within the next month.  Status post laparoscopic gastric sleeve with class 3 severe obesity, significant weight loss, and ongoing nutritional supplementation Status post laparoscopic gastric sleeve with significant weight loss of approximately 103 pounds. Ongoing nutritional supplementation with multivitamins and vitamin D . Blood pressure is well-managed. - Continue multivitamin and vitamin D  supplementation.  Iron deficiency, currently supplemented and monitored Iron deficiency with normal iron and ferritin levels as of last  serum check. Currently taking iron supplements once daily due to previous constipation issues with higher dosage. - Continue iron supplementation once daily.   Foot pain, possible bunion, referred to podiatry Foot pain possibly due to bunion or lack of fat pads in feet. Previous podiatry consultation recommended surgery when ready. - Ordered referral to podiatry for evaluation and management of foot pain.    Return in about 6 months (around 04/06/2025) for F/U with Kimmerly Lora.    Lauraine FORBES Pereyra, NP

## 2024-10-07 NOTE — Assessment & Plan Note (Signed)
 Status post laparoscopic gastric sleeve with class 3 severe obesity, significant weight loss, and ongoing nutritional supplementation Status post laparoscopic gastric sleeve with significant weight loss of approximately 103 pounds. Ongoing nutritional supplementation with multivitamins and vitamin D . Blood pressure is well-managed. - Continue multivitamin and vitamin D  supplementation.

## 2024-10-07 NOTE — Addendum Note (Signed)
 Addended by: LEAR, Torrance Stockley P on: 10/07/2024 01:32 PM   Modules accepted: Orders

## 2024-10-07 NOTE — Assessment & Plan Note (Signed)
 Foot pain, possible bunion, referred to podiatry Foot pain possibly due to bunion or lack of fat pads in feet. Previous podiatry consultation recommended surgery when ready. - Ordered referral to podiatry for evaluation and management of foot pain.

## 2024-10-07 NOTE — Assessment & Plan Note (Signed)
  Iron deficiency, currently supplemented and monitored Iron deficiency with normal iron and ferritin levels as of last serum check. Currently taking iron supplements once daily due to previous constipation issues with higher dosage. - Continue iron supplementation once daily.

## 2024-10-07 NOTE — Assessment & Plan Note (Signed)
 Severe osteoarthritis of right hip and left knee with impaired mobility and ADLs Severe osteoarthritis in the right hip and left knee causing significant pain and impaired mobility. Planning for total right hip replacement surgery within the next month. Difficulty with ADLs such as bathing and dressing due to pain and mobility issues. Requesting a bedside commode due to difficulty with toilet use. - Ordered referral to home health for evaluation and potential therapy services. - Ordered prescription for bedside commode to assist with toilet use. - Plan for total right hip replacement surgery within the next month.

## 2024-10-09 NOTE — Progress Notes (Signed)
 Subjective:   Patient ID: [name], female   DOB: 48 y.o.   MRN: [id number]   HPI Patient presents stating that she is having a lot of pain with her bunions of both feet and the bones on the outside of both feet.  Patient states that this has been ongoing and that it is increasingly hard to wear shoe gear and she has tried shoe gear modifications she has tried soaks without relief of symptoms.  Patient does not currently smoke likes to be active   Review of Systems  All other systems reviewed and are negative.       Objective:  Physical Exam Vitals and nursing note reviewed.  Constitutional:      Appearance: She is well-developed.  Pulmonary:     Effort: Pulmonary effort is normal.  Musculoskeletal:        General: Normal range of motion.  Skin:    General: Skin is warm.  Neurological:     Mental Status: She is alert.     Neurovascular status was found to be intact muscle strength was found to be adequate range of motion adequate with the patient noted to have significant structural deformity of the first metatarsal with redness and pain bilateral with pain and redness around the fifth metatarsal head bilateral with inability to wear shoe gear comfortably at the current time.  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  I recommended osteotomy surgery first metatarsal bilateral along with fifth metatarsal bilateral and 1 foot to be done at the time.  Patient wants to pursue this will tentatively have surgery middle of January and all questions answered concerning distal osteotomy today.  X-rays indicate elevation of  1-2 intermetatarsal angle approximately 15 degrees bilateral with elevation of 4 5 intermetatarsal angle

## 2024-10-12 ENCOUNTER — Ambulatory Visit: Payer: Self-pay | Admitting: Allergy & Immunology

## 2024-10-14 ENCOUNTER — Ambulatory Visit

## 2024-10-14 DIAGNOSIS — J4550 Severe persistent asthma, uncomplicated: Secondary | ICD-10-CM

## 2024-10-18 ENCOUNTER — Other Ambulatory Visit: Payer: Self-pay | Admitting: Physician Assistant

## 2024-10-18 DIAGNOSIS — Z01818 Encounter for other preprocedural examination: Secondary | ICD-10-CM

## 2024-10-19 NOTE — Patient Instructions (Addendum)
 SURGICAL WAITING ROOM VISITATION Patients having surgery or a procedure may have no more than 2 support people in the waiting area - these visitors may rotate in the visitor waiting room.   If the patient needs to stay at the hospital during part of their recovery, the visitor guidelines for inpatient rooms apply.  PRE-OP VISITATION  Pre-op nurse will coordinate an appropriate time for 1 support person to accompany the patient in pre-op.  This support person may not rotate.  This visitor will be contacted when the time is appropriate for the visitor to come back in the pre-op area.  Please refer to the [facility] website for the visitor guidelines for Inpatients (after your surgery is over and you are in a regular room).  You are not required to quarantine at this time prior to your surgery. However, you must do this: Hand Hygiene often Do NOT share personal items Notify your provider if you are in close contact with someone who has COVID or you develop fever 100.4 or greater, new onset of sneezing, cough, sore throat, shortness of breath or body aches.  If you test positive for Covid or have been in contact with anyone that has tested positive in the last 10 days please notify you surgeon.    Your procedure is scheduled on:  Friday 10-29-2024  Report to [facility] Main Entrance: [address] entrance where the [employer] is available.   Report to admitting at: 08:45    AM  Call this number if you have any questions or problems the morning of surgery [phone]  Do not eat food after Midnight the night prior to your surgery/procedure.  After Midnight you may have the following liquids until  0815  AM DAY OF SURGERY  Clear Liquid Diet Water  Black Coffee (sugar ok, NO MILK/CREAM OR CREAMERS)  Tea (sugar ok, NO MILK/CREAM OR CREAMERS) regular and decaf                             Plain Jell-O  with no fruit (NO RED)                                           Fruit ices (not  with fruit pulp, NO RED)                                     Popsicles (NO RED)                                                                  Juice: NO CITRUS JUICES: only apple, WHITE grape, WHITE cranberry Sports drinks like Gatorade or Powerade (NO RED)                  FOLLOW ANY ADDITIONAL PRE OP INSTRUCTIONS YOU RECEIVED FROM YOUR SURGEON'S OFFICE!!!   Oral Hygiene is also important to reduce your risk of infection.        Remember - BRUSH YOUR TEETH THE MORNING OF SURGERY WITH YOUR REGULAR TOOTHPASTE  Do NOT  smoke after Midnight the night before surgery.  STOP TAKING all Vitamins, Herbs and supplements 1 week before your surgery.   Take ONLY these medicines the morning of surgery with A SIP OF WATER : Pantoprazole , esomeprazole , levocetirizine, Nebulizers / inhalers as needed,  Oxycodone  APAP as needed and  you may use your Eye drops.  If You have been diagnosed with Sleep Apnea - Bring CPAP mask and tubing day of surgery. We will provide you with a CPAP machine on the day of your surgery.                   You may not have any metal on your body including hair pins, jewelry, and body piercing  Do not wear make-up, lotions, powders, perfumes or deodorant  Do not wear nail polish including gel and S&S, artificial / acrylic nails, or any other type of covering on natural nails including finger and toenails. If you have artificial nails, gel coating, etc., that needs to be removed by a nail salon, Please have this removed prior to surgery. Not doing so may mean that your surgery could be cancelled or delayed if the Surgeon or anesthesia staff feels like they are unable to monitor you safely.   Do not shave 48 hours prior to surgery to avoid nicks in your skin which may contribute to postoperative infections.    Contacts, Hearing Aids, dentures or bridgework may not be worn into surgery. DENTURES WILL BE REMOVED PRIOR TO SURGERY PLEASE DO NOT APPLY Poly grip OR ADHESIVES!!!  You  may bring a small overnight bag with you on the day of surgery, only pack items that are not valuable. [facility] IS NOT RESPONSIBLE   FOR VALUABLES THAT ARE LOST OR STOLEN.   Do not bring your home medications to the hospital. The Pharmacy will dispense medications listed on your medication list to you during your admission in the Hospital.  Please read over the following fact sheets you were given: IF YOU HAVE QUESTIONS ABOUT YOUR PRE-OP INSTRUCTIONS, PLEASE CALL [phone].      Pre-operative 4 CHG Bath Instructions   You can play a key role in reducing the risk of infection after surgery. Your skin needs to be as free of germs as possible. You can reduce the number of germs on your skin by washing with CHG (chlorhexidine  gluconate) soap before surgery. CHG is an antiseptic soap that kills germs and continues to kill germs even after washing.   DO NOT use if you have an allergy to chlorhexidine /CHG or antibacterial soaps. If your skin becomes reddened or irritated, stop using the CHG and notify one of our RNs at [phone]  Please shower with the CHG soap starting 4 days before surgery using the following schedule:      Do NOT use CHG soap  the morning of your                                                                                                                                 surgery.         Please keep in mind the following:  DO NOT shave, including legs and underarms, starting the day of your first shower.   You may shave your face at any point before/day of surgery.  Place clean sheets on your bed the day you start using CHG soap. Use a clean washcloth (not used since being washed) for each shower. DO NOT sleep with pets once you start using the CHG.  CHG Shower Instructions:  If you choose to wash your hair and private area, wash first with your  normal shampoo/soap.  After you use shampoo/soap, rinse your hair and body thoroughly to remove shampoo/soap residue.  Turn the water  OFF and apply about 3 tablespoons (45 ml) of CHG soap to a CLEAN washcloth.  Apply CHG soap ONLY FROM YOUR NECK DOWN TO YOUR TOES (washing for 3-5 minutes)  DO NOT use CHG soap on face, private areas, open wounds, or sores.  Pay special attention to the area where your surgery is being performed.  If you are having back surgery, having someone wash your back for you may be helpful. Wait 2 minutes after CHG soap is applied, then you may rinse off the CHG soap.  Pat dry with a clean towel  Put on clean clothes/pajamas   If you choose to wear lotion, please use ONLY the CHG-compatible lotions on the back of this paper.     Additional instructions for the day of surgery: DO NOT APPLY any CHG Soap,  lotions, deodorants, cologne, or perfumes on the day of surgery  Put on clean/comfortable clothes.  Brush your teeth.  Ask your nurse before applying any prescription medications to the skin.   CHG Compatible Lotions   Aveeno Moisturizing lotion  Cetaphil Moisturizing Cream  Cetaphil Moisturizing Lotion  Clairol Herbal Essence Moisturizing Lotion, Dry Skin  Clairol Herbal Essence Moisturizing Lotion, Extra Dry Skin  Clairol Herbal Essence Moisturizing Lotion, Normal Skin  Curel Age Defying Therapeutic Moisturizing Lotion with Alpha Hydroxy  Curel Extreme Care Body Lotion  Curel Soothing Hands Moisturizing Hand Lotion  Curel Therapeutic Moisturizing Cream, Fragrance-Free  Curel Therapeutic Moisturizing Lotion, Fragrance-Free  Curel Therapeutic Moisturizing Lotion, Original Formula  Eucerin Daily Replenishing Lotion  Eucerin Dry Skin Therapy Plus Alpha Hydroxy Crme  Eucerin Dry Skin Therapy Plus Alpha Hydroxy Lotion  Eucerin Original Crme  Eucerin Original Lotion  Eucerin Plus Crme Eucerin Plus Lotion  Eucerin TriLipid Replenishing Lotion  Keri  Anti-Bacterial Hand Lotion  Keri Deep Conditioning Original Lotion Dry Skin Formula Softly Scented  Keri Deep Conditioning Original Lotion, Fragrance Free Sensitive Skin Formula  Keri Lotion Fast Absorbing Fragrance Free Sensitive Skin Formula  Keri Lotion Fast Absorbing Softly Scented Dry Skin  Formula  Keri Original Lotion  Keri Skin Renewal Lotion Keri Silky Smooth Lotion  Keri Silky Smooth Sensitive Skin Lotion  Nivea Body Creamy Conditioning Oil  Nivea Body Extra Enriched Lotion  Nivea Body Original Lotion  Nivea Body Sheer Moisturizing Lotion Nivea Crme  Nivea Skin Firming Lotion  NutraDerm 30 Skin Lotion  NutraDerm Skin Lotion  NutraDerm Therapeutic Skin Cream  NutraDerm Therapeutic Skin Lotion  ProShield Protective Hand Cream  Provon moisturizing lotion   FAILURE TO FOLLOW THESE INSTRUCTIONS MAY RESULT IN THE CANCELLATION OF YOUR SURGERY  PATIENT SIGNATURE_________________________________  NURSE SIGNATURE__________________________________  ________________________________________________________________________       Nasario Exon    An incentive spirometer is a tool that can help keep your lungs clear and active. This tool measures how well you are filling your lungs with each breath. Taking long deep breaths may help reverse or decrease the chance of developing breathing (pulmonary) problems (especially infection) following: A long period of time when you are unable to move or be active. BEFORE THE PROCEDURE  If the spirometer includes an indicator to show your best effort, your nurse or respiratory therapist will set it to a desired goal. If possible, sit up straight or lean slightly forward. Try not to slouch. Hold the incentive spirometer in an upright position. INSTRUCTIONS FOR USE  Sit on the edge of your bed if possible, or sit up as far as you can in bed or on a chair. Hold the incentive spirometer in an upright position. Breathe out  normally. Place the mouthpiece in your mouth and seal your lips tightly around it. Breathe in slowly and as deeply as possible, raising the piston or the ball toward the top of the column. Hold your breath for 3-5 seconds or for as long as possible. Allow the piston or ball to fall to the bottom of the column. Remove the mouthpiece from your mouth and breathe out normally. Rest for a few seconds and repeat Steps 1 through 7 at least 10 times every 1-2 hours when you are awake. Take your time and take a few normal breaths between deep breaths. The spirometer may include an indicator to show your best effort. Use the indicator as a goal to work toward during each repetition. After each set of 10 deep breaths, practice coughing to be sure your lungs are clear. If you have an incision (the cut made at the time of surgery), support your incision when coughing by placing a pillow or rolled up towels firmly against it. Once you are able to get out of bed, walk around indoors and cough well. You may stop using the incentive spirometer when instructed by your caregiver.  RISKS AND COMPLICATIONS Take your time so you do not get dizzy or light-headed. If you are in pain, you may need to take or ask for pain medication before doing incentive spirometry. It is harder to take a deep breath if you are having pain. AFTER USE Rest and breathe slowly and easily. It can be helpful to keep track of a log of your progress. Your caregiver can provide you with a simple table to help with this. If you are using the spirometer at home, follow these instructions: SEEK MEDICAL CARE IF:  You are having difficultly using the spirometer. You have trouble using the spirometer as often as instructed. Your pain medication is not giving enough relief while using the spirometer. You develop fever of 100.5 F (38.1 C) or higher.  SEEK IMMEDIATE MEDICAL CARE IF:  You cough up bloody sputum that had not been present before. You develop fever of 102 F (38.9 C) or greater. You develop worsening pain at or near the incision site. MAKE SURE YOU:  Understand these instructions. Will watch your condition. Will get help right away if you are not doing well or get worse. Document Released: 03/24/2007 Document Revised: 02/03/2012 Document Reviewed: 05/25/2007 [facility] Patient Information 2014 [place], [place].       WHAT IS A BLOOD TRANSFUSION? Blood Transfusion Information  A transfusion is the replacement of blood or some of its parts. Blood is made up of multiple cells which provide different functions. Red blood cells carry oxygen and are used for blood loss replacement. White blood cells fight against infection. Platelets control bleeding. Plasma helps clot blood. Other blood products are available for specialized needs, such as hemophilia or other clotting disorders. BEFORE THE TRANSFUSION  Who gives blood for transfusions?  Healthy volunteers who are fully evaluated to make sure their blood is safe. This is blood bank blood. Transfusion therapy is the safest it has ever been in the practice of medicine. Before blood is taken from a donor, a complete history is taken to make sure that person has no history of diseases nor engages in risky social behavior (examples are intravenous drug use or sexual activity with multiple partners). The donor's travel history is screened to minimize risk of transmitting infections, such as malaria. The donated blood is tested for signs of infectious diseases, such as HIV and hepatitis. The blood is then tested to be sure it is compatible with you in order to minimize the chance of a transfusion reaction. If you or a relative donates blood, this is often done in anticipation of surgery and is not appropriate for emergency situations. It takes many days to process the donated  blood. RISKS AND COMPLICATIONS Although transfusion therapy is very safe and saves many lives, the main dangers of transfusion include:  Getting an infectious disease. Developing a transfusion reaction. This is an allergic reaction to something in the blood you were given. Every precaution is taken to prevent this. The decision to have a blood transfusion has been considered carefully by your caregiver before blood is given. Blood is not given unless the benefits outweigh the risks. AFTER THE TRANSFUSION Right after receiving a blood transfusion, you will usually feel much better and more energetic. This is especially true if your red blood cells have gotten low (anemic). The transfusion raises the level of the red blood cells which carry oxygen, and this usually causes an energy increase. The nurse administering the transfusion will monitor you carefully for complications. HOME CARE INSTRUCTIONS  No special instructions are needed after a transfusion. You may find your energy is better. Speak with your caregiver about any limitations on activity for underlying diseases you may have. SEEK MEDICAL CARE IF:  Your condition is not improving after your transfusion. You develop redness or irritation at the intravenous (IV) site. SEEK IMMEDIATE MEDICAL CARE IF:  Any of the following symptoms occur over the next 12 hours: Shaking chills. You have a temperature by mouth above 102 F (38.9 C), not controlled by medicine. Chest, back, or muscle pain. People around you feel you are not acting correctly or are confused. Shortness of breath or difficulty breathing. Dizziness and fainting. You get a rash or develop hives. You have a decrease in urine output. Your urine turns a dark color or changes to pink,  red, or brown. Any of the following symptoms occur over the next 10 days: You have a temperature by mouth above 102 F (38.9 C), not controlled by medicine. Shortness of breath. Weakness after  normal activity. The white part of the eye turns yellow (jaundice). You have a decrease in the amount of urine or are urinating less often. Your urine turns a dark color or changes to pink, red, or brown. Document Released: 11/08/2000 Document Revised: 02/03/2012 Document Reviewed: 06/27/2008 [facility] Patient Information 2014 [employer], [place].  _______________________________________________________________________  If you would like to see a video about joint replacement:   [email or web address]

## 2024-10-19 NOTE — Progress Notes (Signed)
 COVID Vaccine received:  [x]  No []  Yes Date of any COVID positive Test in last 90 days:  none  PCP - Lauraine Pereyra, NP  Cardiologist - None PM&R- Fidela Ned, NP    Chest x-ray - 06-11-2023  2v    Epic EKG - 05-29-24  Epic  Stress Test -  ECHO - 07-01-2023  Epic Cardiac Cath -  CT Coronary Calcium score:   Pacemaker / ICD device [x]  No []  Yes   Spinal Cord Stimulator:[x]  No []  Yes       History of Sleep Apnea? [x]  No []  Yes   CPAP used?- [x]  No []  Yes    MED RECON. 10-15-2024  Medication on DOS: Pantoprazole , esomeprazole , Eye drops prn, levocetirizine,  Nebulizers / inhalers prn. Oxycodone  APAP prn   Patient is going to talk to Dr. Damian staff re:  when to stop Birth Control Pills, and her DME needs postop.   Patient has: [x]  NO Hx DM   []  Pre-DM   []  DM1  []   DM2 Does the patient monitor blood sugar?   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  []  No   [x]    Yes  Patient can perform ADLs without assistance. []  No   [x]   Yes  Anesthesia review: asthma, Graves Disease (no meds- hasn't seen MD in a while) , CPS-long term opiates , Heart murmur (Echo 07-01-2023), GERD, hx MRSA  Patient denies any S&S of respiratory illness or Covid - no shortness of breath, fever, cough or chest pain at PAT appointment.  Patient verbalized understanding and agreement to the Pre-Surgical Instructions that were given to them at this PAT appointment. Patient was also educated of the need to review these PAT instructions again prior to her surgery.I reviewed the appropriate phone numbers to call if they have any and questions or concerns.

## 2024-10-20 ENCOUNTER — Encounter (HOSPITAL_COMMUNITY)
Admission: RE | Admit: 2024-10-20 | Discharge: 2024-10-20 | Disposition: A | Discharge status: Home / Self Care | Source: Ambulatory Visit | Attending: Orthopaedic Surgery | Admitting: Orthopaedic Surgery

## 2024-10-20 ENCOUNTER — Encounter (HOSPITAL_COMMUNITY): Payer: Self-pay

## 2024-10-20 ENCOUNTER — Other Ambulatory Visit: Payer: Self-pay

## 2024-10-20 VITALS — BP 119/69 | HR 64 | Temp 97.9°F | Resp 16 | Ht 67.0 in | Wt 239.0 lb

## 2024-10-20 DIAGNOSIS — G894 Chronic pain syndrome: Secondary | ICD-10-CM | POA: Insufficient documentation

## 2024-10-20 DIAGNOSIS — Z01812 Encounter for preprocedural laboratory examination: Secondary | ICD-10-CM | POA: Diagnosis present

## 2024-10-20 DIAGNOSIS — M1611 Unilateral primary osteoarthritis, right hip: Secondary | ICD-10-CM | POA: Insufficient documentation

## 2024-10-20 DIAGNOSIS — Z01818 Encounter for other preprocedural examination: Secondary | ICD-10-CM

## 2024-10-20 DIAGNOSIS — Z79899 Other long term (current) drug therapy: Secondary | ICD-10-CM | POA: Diagnosis not present

## 2024-10-20 HISTORY — DX: Anxiety disorder, unspecified: F41.9

## 2024-10-20 HISTORY — DX: Thyrotoxicosis, unspecified without thyrotoxic crisis or storm: E05.90

## 2024-10-20 LAB — COMPREHENSIVE METABOLIC PANEL WITH GFR
ALT: 11 U/L (ref 0–44)
AST: 16 U/L (ref 15–41)
Albumin: 3.8 g/dL (ref 3.5–5.0)
Alkaline Phosphatase: 82 U/L (ref 38–126)
Anion gap: 9 (ref 5–15)
BUN: 12 mg/dL (ref 6–20)
CO2: 24 mmol/L (ref 22–32)
Calcium: 9.4 mg/dL (ref 8.9–10.3)
Chloride: 105 mmol/L (ref 98–111)
Creatinine, Ser: 0.65 mg/dL (ref 0.44–1.00)
GFR, Estimated: >60 mL/min (ref >60)
Glucose, Bld: 89 mg/dL (ref 70–99)
Potassium: 4 mmol/L (ref 3.5–5.1)
Sodium: 138 mmol/L (ref 135–145)
Total Bilirubin: 0.4 mg/dL (ref 0.0–1.2)
Total Protein: 7.8 g/dL (ref 6.5–8.1)

## 2024-10-20 LAB — CBC
HCT: 42.2 % (ref 36.0–46.0)
Hemoglobin: 13.7 g/dL (ref 12.0–15.0)
MCH: 30.2 pg (ref 26.0–34.0)
MCHC: 32.5 g/dL (ref 30.0–36.0)
MCV: 93.2 fL (ref 80.0–100.0)
Platelets: 203 K/uL (ref 150–400)
RBC: 4.53 MIL/uL (ref 3.87–5.11)
RDW: 14.6 % (ref 11.5–15.5)
WBC: 10.6 K/uL — ABNORMAL HIGH (ref 4.0–10.5)
nRBC: 0 % (ref 0.0–0.2)

## 2024-10-20 LAB — SURGICAL PCR SCREEN
MRSA, PCR: NEGATIVE
Staphylococcus aureus: NEGATIVE

## 2024-10-26 NOTE — Care Plan (Signed)
 Ortho Bundle Case Management Note  Patient Details  Name: [name] MRN: [id number] Date of Birth: 22-Apr-1976  [facility] RNCM call to patient to discuss her upcoming Right total hip arthroplasty with Dr. Vernetta at [facility] on 10/29/24. She is agreeable to case management. Patient plans to return home with assistance from sister, daughters and boyfriend. She will need a RW and requested a 3in1/BSC. Referral placed to [facility] prior to surgery. Anticipate HHPT will be needed after a short hospital stay. Referral made to [facility] after choice provided. Patient informed that her PCP has also put in order for skillled nursing, HHA, and therapy as well. She called back and states [facility] is who she would like to go with. Explained difference between a paid CG through Medicaid and skilled HHPT. Will continue with referral to [facility] for HHPT and will attempt to check to see if [facility] has gotten referral regarding a paid CG. Reviewed post op care instructions and questions answered. Will continue to follow for needs.                 DME Arranged:  3-N-1, Walker rolling DME Agency:  [facility]  HH Arranged:  PT HH Agency:  [facility]  Additional Comments: Please contact me with any questions of if this plan should need to change.  Tylene Ned, RN, BSN, [employer]  [phone] 10/26/2024, 3:45 PM

## 2024-10-28 NOTE — H&P (Signed)
 TOTAL HIP ADMISSION H&P  Patient is admitted for right total hip arthroplasty.  Subjective:  Chief Complaint: right hip pain  HPI: [name], 48 y.o. female, has a history of pain and functional disability in the right hip(s) due to arthritis and patient has failed non-surgical conservative treatments for greater than 12 weeks to include NSAID's and/or analgesics, corticosteriod injections, flexibility and strengthening excercises, use of assistive devices, weight reduction as appropriate, and activity modification.  Onset of symptoms was gradual starting several years ago with gradually worsening course since that time.The patient noted no past surgery on the right hip(s).  Patient currently rates pain in the right hip at 10 out of 10 with activity. Patient has night pain, worsening of pain with activity and weight bearing, trendelenberg gait, pain that interfers with activities of daily living, and pain with passive range of motion. Patient has evidence of subchondral sclerosis, periarticular osteophytes, joint space narrowing, and protrusio by imaging studies. This condition presents safety issues increasing the risk of falls.  There is no current active infection.  Patient Active Problem List   Diagnosis Date Noted   Bunion 10/07/2024   Abrasion of abdominal wall 04/02/2024   Diabetes mellitus screening 04/02/2024   Vitamin B12 deficiency 12/15/2023   Constipation 10/31/2023   Acute left ankle pain 10/31/2023   Numbness and tingling of right leg 10/05/2023   Rash 08/01/2023   Abdominal pain 06/06/2023   Iron deficiency 06/06/2023   Murmur 06/06/2023   Fibroids 05/13/2023   Abnormal uterine bleeding 04/25/2023   Chronic pain of left knee 03/31/2023   Class 3 severe obesity due to excess calories with body mass index (BMI) of 45.0 to 49.9 in adult [facility]) 03/21/2023   Hidradenitis suppurativa 03/21/2023   Right lumbar radiculopathy 03/03/2023   Morbid obesity (HCC) 03/03/2023    Chronic pain syndrome 03/03/2023   Encounter for long-term opiate analgesic use 01/29/2023   Unilateral primary osteoarthritis, right hip 01/21/2023   Graves disease 07/06/2019   Hyperthyroidism 07/06/2019   Tobacco use disorder 05/26/2019   Perennial and seasonal allergic rhinitis 08/19/2017   Moderate persistent asthma without complication 08/19/2017   Past Medical History:  Diagnosis Date   Anxiety    Arthritis    Asthma    Diverticulitis of colon    GERD (gastroesophageal reflux disease)    Heart murmur    Hydradenitis    Hyperthyroidism    Graves Disease   Iron (Fe) deficiency anemia    MRSA (methicillin resistant staph aureus) culture positive    Obesity     Past Surgical History:  Procedure Laterality Date   CESAREAN SECTION     x2   HIATAL HERNIA REPAIR N/A 03/09/2024   Procedure: REPAIR, HERNIA, HIATAL;  Surgeon: Signe Mitzie LABOR, MD;  Location: [facility];  Service: General;  Laterality: N/A;   LAPAROSCOPIC GASTRIC SLEEVE RESECTION N/A 03/09/2024   Procedure: GASTRECTOMY, SLEEVE, LAPAROSCOPIC;  Surgeon: Signe Mitzie LABOR, MD;  Location: [facility];  Service: General;  Laterality: N/A;   TUBAL LIGATION     UPPER GI ENDOSCOPY N/A 03/09/2024   Procedure: ENDOSCOPY, UPPER GI TRACT;  Surgeon: Signe Mitzie LABOR, MD;  Location: [facility];  Service: General;  Laterality: N/A;   WISDOM TOOTH EXTRACTION  2011    Current Facility-Administered Medications  Medication Dose Route Frequency Provider Last Rate Last Admin   tezepelumab -ekko (TEZSPIRE ) 210 MG/1. syringe 210 mg  210 mg Subcutaneous Q28 days Iva Marty Saltness, MD   210 mg at 10/14/24 [id number]  Current Outpatient Medications  Medication Sig Dispense Refill Last Dose/Taking   albuterol  (PROVENTIL ) (2.5 MG/3ML) 0.083% nebulizer solution Inhale 1 vial via nebulizer every 4 hours as needed for wheezing/shortness of breath 75 mL 5 Taking   albuterol  (VENTOLIN  HFA) 108 (90 Base) MCG/ACT inhaler Inhale 2 puffs by mouth every 4  hours as needed for wheezing/shortness of breath 8.5 each 11 Taking   Calcium Carbonate (CALCI-CHEW PO) Take 1 tablet by mouth in the morning, at noon, and at bedtime. Bariatric Calcium Chews   Taking   cromolyn  (OPTICROM ) 4 % ophthalmic solution Place 1 drop into both eyes 4 (four) times daily. (Patient taking differently: Place 1 drop into both eyes 4 (four) times daily as needed (allergies.).) 10 mL 5 Taking Differently   desogestrel -ethinyl estradiol  (ENSKYCE ) 0.15-30 MG-MCG tablet Take 1 tablet by mouth daily. 84 tablet 11 Taking   ELIDEL  1 % cream Apply to aa's atopic dermatitis QD-BID PRN flares. (Patient taking differently: Apply 1 Application topically 2 (two) times daily as needed (skin irritation.).) 60 g 3 Taking Differently   esomeprazole  (NEXIUM ) 40 MG capsule Take 1 capsule by mouth twice daily (Patient taking differently: Take 40 mg by mouth in the morning.) 60 capsule 11 Taking Differently   ferrous sulfate  (FEROSUL) 325 (65 FE) MG tablet Take 1 tablet (325 mg total) by mouth daily with breakfast. 90 tablet 3 Taking   Fluticasone -Umeclidin-Vilant (TRELEGY ELLIPTA ) 200-62.5-25 MCG/ACT AEPB Inhale 1 puff into the lungs daily. 60 each 11 Taking   levocetirizine (XYZAL ) 5 MG tablet Take 1 tablet by mouth every day as needed for allergies 30 tablet 11 Taking   mesalamine  (APRISO ) 0.375 g 24 hr capsule Take 4 capsules (1.5 g total) by mouth daily. NEEDS APPT FOR FURTHER REFILLS 360 capsule 0 Taking   montelukast  (SINGULAIR ) 10 MG tablet TAKE 1 TABLET BY MOUTH AT  BEDTIME 100 tablet 4 Taking   Multiple Vitamins-Minerals (BARIATRIC MULTIVITAMIN/IRON PO) Take by mouth.   Taking   mupirocin  ointment (BACTROBAN ) 2 % Apply 1 Application topically 2 (two) times daily. (Patient taking differently: Apply 1 Application topically 2 (two) times daily as needed (wound care).) 22 g 0 Taking Differently   oxyCODONE -acetaminophen  (PERCOCET/ROXICET) 5-325 MG tablet Take 1-2 tablets by mouth See admin  instructions. Take 1 tablet by mouth twice daily & take 2 tablets by mouth at bedtime.   Taking   pantoprazole  (PROTONIX ) 40 MG tablet Take 1 tablet (40 mg total) by mouth daily. 90 tablet 1 Taking   Plecanatide  (TRULANCE ) 3 MG TABS TAKE 1 TABLET BY MOUTH DAILY (Patient taking differently: Take 1 tablet by mouth daily as needed (constipation.).) 30 tablet 3 Taking Differently   Vitamin D , Ergocalciferol , (DRISDOL) 1.25 MG (50000 UNIT) CAPS capsule Take 1 capsule by mouth once a week (Patient taking differently: Take 50,000 Units by mouth every Monday.) 4 capsule 11 Taking Differently   AVSOLA  100 MG injection Inject into the vein every 28 (twenty-eight) days.      EPINEPHrine  0.3 mg/0.3 mL IJ SOAJ injection Inject 0.3 mg into the muscle as needed for anaphylaxis. 2 mL 2    Respiratory Therapy Supplies (NEBULIZER MASK ADULT/TUBING) MISC 1 each by Does not apply route daily. 1 each 1    Secukinumab  (COSENTYX  UNOREADY) 300 MG/2ML SOAJ Inject 300 mg into the skin every 28 (twenty-eight) days. 2 mL 3    Spacer/Aero-Holding Chambers (AEROCHAMBER PLUS) inhaler Use as instructed 1 each 2    TEZSPIRE  210 MG/1. syringe INJECT 1 SYRINGE SUBCUTANEOUSLY  EVERY  4 WEEKS 1.91 mL 11    Allergies  Allergen Reactions   Shellfish Allergy Anaphylaxis   Shellfish Protein-Containing Drug Products Hives, Itching and Swelling   Minocycline  Other (See Comments)    Skin hyperpigmentation.  Can tolerate doxycycline  (it seems)  Other Reaction(s): Other (See Comments)  Skin hyperpigmentation. Can tolerate doxycycline  (it seems)    Social History   Tobacco Use   Smoking status: Former    Current packs/day: 0.33    Types: Cigarettes    Passive exposure: Current   Smokeless tobacco: Never  Substance Use Topics   Alcohol use: Not Currently    Family History  Problem Relation Age of Onset   Diabetes Mother    Hypertension Mother    Diabetes Father    Asthma Other    Hyperlipidemia Other    Hypertension  Other    Colon cancer Neg Hx    Esophageal cancer Neg Hx    Rectal cancer Neg Hx    Stomach cancer Neg Hx    Breast cancer Neg Hx    BRCA 1/2 Neg Hx      Review of Systems  Objective:  Physical Exam Vitals reviewed.  Constitutional:      Appearance: Normal appearance. She is obese.  HENT:     Head: Normocephalic and atraumatic.  Eyes:     Extraocular Movements: Extraocular movements intact.     Pupils: Pupils are equal, round, and reactive to light.  Cardiovascular:     Rate and Rhythm: Normal rate and regular rhythm.  Pulmonary:     Effort: Pulmonary effort is normal.     Breath sounds: Normal breath sounds.  Abdominal:     Palpations: Abdomen is soft.  Musculoskeletal:     Cervical back: Normal range of motion and neck supple.     Right hip: Tenderness and bony tenderness present. Decreased range of motion. Decreased strength.  Neurological:     Mental Status: She is alert and oriented to person, place, and time.  Psychiatric:        Behavior: Behavior normal.     Vital signs in last 24 hours:    Labs:   Estimated body mass index is 37.43 kg/m as calculated from the following:   Height as of 10/20/24: 5' 7 (1.702 m).   Weight as of 10/20/24: 108.4 kg.   Imaging Review Plain radiographs demonstrate severe degenerative joint disease of the right hip(s). The bone quality appears to be good for age and reported activity level.      Assessment/Plan:  End stage arthritis, right hip(s)  The patient history, physical examination, clinical judgement of the provider and imaging studies are consistent with end stage degenerative joint disease of the right hip(s) and total hip arthroplasty is deemed medically necessary. The treatment options including medical management, injection therapy, arthroscopy and arthroplasty were discussed at length. The risks and benefits of total hip arthroplasty were presented and reviewed. The risks due to aseptic loosening,  infection, stiffness, dislocation/subluxation,  thromboembolic complications and other imponderables were discussed.  The patient acknowledged the explanation, agreed to proceed with the plan and consent was signed. Patient is being admitted for inpatient treatment for surgery, pain control, PT, OT, prophylactic antibiotics, VTE prophylaxis, progressive ambulation and ADL's and discharge planning.The patient is planning to be discharged home with home health services

## 2024-10-29 ENCOUNTER — Encounter (HOSPITAL_COMMUNITY)
Admission: RE | Disposition: A | Discharge status: Home / Self Care | Payer: Self-pay | Source: Ambulatory Visit | Attending: Orthopaedic Surgery

## 2024-10-29 ENCOUNTER — Other Ambulatory Visit: Payer: Self-pay

## 2024-10-29 ENCOUNTER — Ambulatory Visit (HOSPITAL_COMMUNITY): Admitting: Certified Registered Nurse Anesthetist

## 2024-10-29 ENCOUNTER — Ambulatory Visit (HOSPITAL_COMMUNITY)

## 2024-10-29 ENCOUNTER — Observation Stay (HOSPITAL_COMMUNITY)

## 2024-10-29 ENCOUNTER — Observation Stay (HOSPITAL_COMMUNITY)
Admission: RE | Admit: 2024-10-29 | Discharge: 2024-10-31 | Disposition: A | Source: Ambulatory Visit | Attending: Orthopaedic Surgery | Admitting: Orthopaedic Surgery

## 2024-10-29 ENCOUNTER — Encounter (HOSPITAL_COMMUNITY): Payer: Self-pay | Admitting: Orthopaedic Surgery

## 2024-10-29 DIAGNOSIS — M1611 Unilateral primary osteoarthritis, right hip: Secondary | ICD-10-CM

## 2024-10-29 DIAGNOSIS — Z96641 Presence of right artificial hip joint: Secondary | ICD-10-CM

## 2024-10-29 DIAGNOSIS — Z7982 Long term (current) use of aspirin: Secondary | ICD-10-CM | POA: Insufficient documentation

## 2024-10-29 DIAGNOSIS — Z87891 Personal history of nicotine dependence: Secondary | ICD-10-CM | POA: Insufficient documentation

## 2024-10-29 DIAGNOSIS — J45909 Unspecified asthma, uncomplicated: Secondary | ICD-10-CM | POA: Insufficient documentation

## 2024-10-29 HISTORY — PX: TOTAL HIP ARTHROPLASTY: SHX124

## 2024-10-29 LAB — TYPE AND SCREEN
ABO/RH(D): A POS
Antibody Screen: NEGATIVE

## 2024-10-29 LAB — POCT PREGNANCY, URINE: Preg Test, Ur: NEGATIVE

## 2024-10-29 SURGERY — ARTHROPLASTY, HIP, TOTAL, ANTERIOR APPROACH
Anesthesia: Monitor Anesthesia Care | Site: Hip | Laterality: Right

## 2024-10-29 MED ORDER — PANTOPRAZOLE SODIUM 40 MG PO TBEC
40.0000 mg | DELAYED_RELEASE_TABLET | Freq: Every day | ORAL | Status: DC
  Administered 2024-10-30 – 2024-10-31 (×2): 40 mg via ORAL
  Filled 2024-10-29 (×2): qty 1

## 2024-10-29 MED ORDER — ALBUTEROL SULFATE (2.5 MG/3ML) 0.083% IN NEBU: 2.5000 mg | INHALATION_SOLUTION | Freq: Four times a day (QID) | RESPIRATORY_TRACT | Status: DC | PRN

## 2024-10-29 MED ORDER — OXYCODONE HCL 5 MG/5ML PO SOLN: 5.0000 mg | Freq: Once | ORAL | Status: DC | PRN

## 2024-10-29 MED ORDER — PROPOFOL 10 MG/ML IV BOLUS
INTRAVENOUS | Status: DC | PRN
  Administered 2024-10-29: 30 mg via INTRAVENOUS

## 2024-10-29 MED ORDER — ONDANSETRON HCL 4 MG PO TABS: 4.0000 mg | ORAL_TABLET | Freq: Four times a day (QID) | ORAL | Status: DC | PRN

## 2024-10-29 MED ORDER — OXYCODONE HCL 5 MG PO TABS
5.0000 mg | ORAL_TABLET | ORAL | Status: DC | PRN
  Administered 2024-10-30 – 2024-10-31 (×3): 5 mg via ORAL
  Filled 2024-10-29 (×3): qty 1

## 2024-10-29 MED ORDER — PHENYLEPHRINE HCL-NACL 20-0.9 MG/250ML-% IV SOLN
INTRAVENOUS | Status: DC | PRN
  Administered 2024-10-29: 40 ug/min via INTRAVENOUS

## 2024-10-29 MED ORDER — OXYCODONE HCL 5 MG PO TABS: 5.0000 mg | ORAL_TABLET | Freq: Once | ORAL | Status: DC | PRN

## 2024-10-29 MED ORDER — PHENOL 1.4 % MT LIQD: 1.0000 | OROMUCOSAL | Status: DC | PRN

## 2024-10-29 MED ORDER — POVIDONE-IODINE 10 % EX SWAB: 2.0000 | Freq: Once | CUTANEOUS | Status: DC

## 2024-10-29 MED ORDER — PROPOFOL 500 MG/50ML IV EMUL
INTRAVENOUS | Status: DC | PRN
  Administered 2024-10-29: 75 ug/kg/min via INTRAVENOUS

## 2024-10-29 MED ORDER — DEXAMETHASONE SOD PHOSPHATE PF 10 MG/ML IJ SOLN
INTRAMUSCULAR | Status: DC | PRN
  Administered 2024-10-29: 10 mg via INTRAVENOUS

## 2024-10-29 MED ORDER — STERILE WATER FOR IRRIGATION IR SOLN
Status: DC | PRN
  Administered 2024-10-29: 2000 mL

## 2024-10-29 MED ORDER — FENTANYL CITRATE (PF) 100 MCG/2ML IJ SOLN
INTRAMUSCULAR | Status: AC
  Filled 2024-10-29: qty 2

## 2024-10-29 MED ORDER — SODIUM CHLORIDE 0.9 % IV SOLN: INTRAVENOUS | Status: DC

## 2024-10-29 MED ORDER — ONDANSETRON HCL 4 MG/2ML IJ SOLN: 4.0000 mg | Freq: Four times a day (QID) | INTRAMUSCULAR | Status: DC | PRN

## 2024-10-29 MED ORDER — HYDROMORPHONE HCL 1 MG/ML IJ SOLN
INTRAMUSCULAR | Status: DC | PRN
  Administered 2024-10-29 (×2): 1 mg via INTRAVENOUS

## 2024-10-29 MED ORDER — MIDAZOLAM HCL 5 MG/5ML IJ SOLN
INTRAMUSCULAR | Status: DC | PRN
  Administered 2024-10-29: 2 mg via INTRAVENOUS

## 2024-10-29 MED ORDER — MENTHOL 3 MG MT LOZG: 1.0000 | LOZENGE | OROMUCOSAL | Status: DC | PRN

## 2024-10-29 MED ORDER — TRANEXAMIC ACID-NACL 1000-0.7 MG/100ML-% IV SOLN
1000.0000 mg | INTRAVENOUS | Status: AC
  Administered 2024-10-29: 1000 mg via INTRAVENOUS
  Filled 2024-10-29: qty 100

## 2024-10-29 MED ORDER — BUPIVACAINE IN DEXTROSE 0.75-8.25 % IT SOLN
INTRATHECAL | Status: DC | PRN
  Administered 2024-10-29: 1.6 mL via INTRATHECAL

## 2024-10-29 MED ORDER — OXYCODONE HCL 5 MG PO TABS
10.0000 mg | ORAL_TABLET | ORAL | Status: DC | PRN
  Administered 2024-10-29: 10 mg via ORAL
  Administered 2024-10-29 – 2024-10-31 (×5): 15 mg via ORAL
  Administered 2024-10-31: 10 mg via ORAL
  Filled 2024-10-29: qty 3
  Filled 2024-10-29 (×2): qty 2
  Filled 2024-10-29 (×4): qty 3

## 2024-10-29 MED ORDER — 0.9 % SODIUM CHLORIDE (POUR BTL) OPTIME
TOPICAL | Status: DC | PRN
  Administered 2024-10-29: 1000 mL

## 2024-10-29 MED ORDER — HYDROMORPHONE HCL 2 MG/ML IJ SOLN
INTRAMUSCULAR | Status: AC
  Filled 2024-10-29: qty 1

## 2024-10-29 MED ORDER — ONDANSETRON HCL 4 MG/2ML IJ SOLN
INTRAMUSCULAR | Status: AC
  Filled 2024-10-29: qty 2

## 2024-10-29 MED ORDER — METOCLOPRAMIDE HCL 5 MG PO TABS: 5.0000 mg | ORAL_TABLET | Freq: Three times a day (TID) | ORAL | Status: DC | PRN

## 2024-10-29 MED ORDER — METHOCARBAMOL 1000 MG/10ML IJ SOLN: 500.0000 mg | Freq: Four times a day (QID) | INTRAMUSCULAR | Status: DC | PRN

## 2024-10-29 MED ORDER — ORAL CARE MOUTH RINSE: 15.0000 mL | Freq: Once | OROMUCOSAL | Status: AC

## 2024-10-29 MED ORDER — FERROUS SULFATE 325 (65 FE) MG PO TABS
325.0000 mg | ORAL_TABLET | Freq: Every day | ORAL | Status: DC
  Administered 2024-10-30 – 2024-10-31 (×2): 325 mg via ORAL
  Filled 2024-10-29 (×2): qty 1

## 2024-10-29 MED ORDER — ALUM & MAG HYDROXIDE-SIMETH 200-200-20 MG/5ML PO SUSP: 30.0000 mL | ORAL | Status: DC | PRN

## 2024-10-29 MED ORDER — HYDROMORPHONE HCL 1 MG/ML IJ SOLN
0.5000 mg | INTRAMUSCULAR | Status: DC | PRN
  Administered 2024-10-29: 1 mg via INTRAVENOUS
  Filled 2024-10-29: qty 1

## 2024-10-29 MED ORDER — ACETAMINOPHEN 325 MG PO TABS
325.0000 mg | ORAL_TABLET | Freq: Four times a day (QID) | ORAL | Status: DC | PRN
  Administered 2024-10-30 (×2): 650 mg via ORAL
  Administered 2024-10-30: 325 mg via ORAL
  Administered 2024-10-31: 650 mg via ORAL
  Filled 2024-10-29 (×5): qty 2

## 2024-10-29 MED ORDER — SODIUM CHLORIDE 0.9 % IR SOLN
Status: DC | PRN
  Administered 2024-10-29: 1000 mL

## 2024-10-29 MED ORDER — METOCLOPRAMIDE HCL 5 MG/ML IJ SOLN: 5.0000 mg | Freq: Three times a day (TID) | INTRAMUSCULAR | Status: DC | PRN

## 2024-10-29 MED ORDER — DIPHENHYDRAMINE HCL 12.5 MG/5ML PO ELIX: 12.5000 mg | ORAL_SOLUTION | ORAL | Status: DC | PRN

## 2024-10-29 MED ORDER — DOCUSATE SODIUM 100 MG PO CAPS
100.0000 mg | ORAL_CAPSULE | Freq: Two times a day (BID) | ORAL | Status: DC
  Administered 2024-10-29 – 2024-10-31 (×4): 100 mg via ORAL
  Filled 2024-10-29 (×4): qty 1

## 2024-10-29 MED ORDER — ASPIRIN 81 MG PO CHEW
81.0000 mg | CHEWABLE_TABLET | Freq: Two times a day (BID) | ORAL | Status: DC
  Administered 2024-10-29 – 2024-10-31 (×4): 81 mg via ORAL
  Filled 2024-10-29 (×4): qty 1

## 2024-10-29 MED ORDER — MONTELUKAST SODIUM 10 MG PO TABS
10.0000 mg | ORAL_TABLET | Freq: Every day | ORAL | Status: DC
  Administered 2024-10-29 – 2024-10-30 (×2): 10 mg via ORAL
  Filled 2024-10-29 (×2): qty 1

## 2024-10-29 MED ORDER — PROPOFOL 10 MG/ML IV BOLUS
INTRAVENOUS | Status: AC
  Filled 2024-10-29: qty 20

## 2024-10-29 MED ORDER — MIDAZOLAM HCL 2 MG/2ML IJ SOLN
INTRAMUSCULAR | Status: AC
  Filled 2024-10-29: qty 2

## 2024-10-29 MED ORDER — CHLORHEXIDINE GLUCONATE 0.12 % MT SOLN
15.0000 mL | Freq: Once | OROMUCOSAL | Status: AC
  Administered 2024-10-29: 15 mL via OROMUCOSAL

## 2024-10-29 MED ORDER — DESOGESTREL-ETHINYL ESTRADIOL 0.15-30 MG-MCG PO TABS: 1.0000 | ORAL_TABLET | Freq: Every day | ORAL | Status: DC

## 2024-10-29 MED ORDER — CEFAZOLIN SODIUM-DEXTROSE 2-4 GM/100ML-% IV SOLN
2.0000 g | INTRAVENOUS | Status: AC
  Administered 2024-10-29: 2 g via INTRAVENOUS
  Filled 2024-10-29: qty 100

## 2024-10-29 MED ORDER — MESALAMINE ER 0.375 G PO CP24: 1.5000 g | ORAL_CAPSULE | Freq: Every day | ORAL | Status: DC

## 2024-10-29 MED ORDER — ONDANSETRON HCL 4 MG/2ML IJ SOLN
INTRAMUSCULAR | Status: DC | PRN
  Administered 2024-10-29: 4 mg via INTRAVENOUS

## 2024-10-29 MED ORDER — CEFAZOLIN SODIUM-DEXTROSE 2-4 GM/100ML-% IV SOLN
2.0000 g | Freq: Four times a day (QID) | INTRAVENOUS | Status: AC
  Administered 2024-10-29 (×2): 2 g via INTRAVENOUS
  Filled 2024-10-29 (×2): qty 100

## 2024-10-29 MED ORDER — FENTANYL CITRATE (PF) 50 MCG/ML IJ SOSY: 25.0000 ug | PREFILLED_SYRINGE | INTRAMUSCULAR | Status: DC | PRN

## 2024-10-29 MED ORDER — LACTATED RINGERS IV SOLN: INTRAVENOUS | Status: DC

## 2024-10-29 MED ORDER — PROPOFOL 1000 MG/100ML IV EMUL
INTRAVENOUS | Status: AC
  Filled 2024-10-29: qty 100

## 2024-10-29 MED ORDER — LIDOCAINE 2% (20 MG/ML) 5 ML SYRINGE
INTRAMUSCULAR | Status: DC | PRN
  Administered 2024-10-29: 30 mg via INTRAVENOUS

## 2024-10-29 MED ORDER — FENTANYL CITRATE (PF) 100 MCG/2ML IJ SOLN
INTRAMUSCULAR | Status: DC | PRN
  Administered 2024-10-29 (×2): 50 ug via INTRAVENOUS
  Administered 2024-10-29: 100 ug via INTRAVENOUS

## 2024-10-29 MED ORDER — METHOCARBAMOL 500 MG PO TABS
500.0000 mg | ORAL_TABLET | Freq: Four times a day (QID) | ORAL | Status: DC | PRN
  Administered 2024-10-29 – 2024-10-31 (×6): 500 mg via ORAL
  Filled 2024-10-29 (×6): qty 1

## 2024-10-29 SURGICAL SUPPLY — 37 items
BAG COUNTER SPONGE SURGICOUNT (BAG) ×1 IMPLANT
BAG ZIPLOCK 12X15 (MISCELLANEOUS) ×1 IMPLANT
BENZOIN TINCTURE PRP APPL 2/3 (GAUZE/BANDAGES/DRESSINGS) IMPLANT
BLADE SAW SGTL 18X1.27X75 (BLADE) ×1 IMPLANT
COVER PERINEAL POST (MISCELLANEOUS) ×1 IMPLANT
COVER SURGICAL LIGHT HANDLE (MISCELLANEOUS) ×1 IMPLANT
CUP ACETAB W/GRIPTION 54 (Plate) IMPLANT
DRAPE FOOT SWITCH (DRAPES) ×1 IMPLANT
DRAPE STERI IOBAN 125X83 (DRAPES) ×1 IMPLANT
DRAPE U-SHAPE 47X51 STRL (DRAPES) ×2 IMPLANT
DRSG AQUACEL AG ADV 3.5X10 (GAUZE/BANDAGES/DRESSINGS) ×1 IMPLANT
DURAPREP 26ML APPLICATOR (WOUND CARE) ×1 IMPLANT
ELECT PENCIL ROCKER SW 15FT (MISCELLANEOUS) ×1 IMPLANT
ELECT REM PT RETURN 15FT ADLT (MISCELLANEOUS) ×1 IMPLANT
GAUZE XEROFORM 1X8 LF (GAUZE/BANDAGES/DRESSINGS) IMPLANT
GLOVE BIO SURGEON STRL SZ7.5 (GLOVE) ×1 IMPLANT
GLOVE BIOGEL PI IND STRL 8 (GLOVE) ×2 IMPLANT
GLOVE SURG ORTHO 8.0 STRL STRW (GLOVE) ×1 IMPLANT
GOWN STRL REUS W/ TWL XL LVL3 (GOWN DISPOSABLE) ×2 IMPLANT
HEAD FEM CER ARTIC -2 36 12/14 (Head) IMPLANT
HOLDER FOLEY CATH W/STRAP (MISCELLANEOUS) ×1 IMPLANT
KIT TURNOVER KIT A (KITS) ×1 IMPLANT
LINER NEUTRAL 54X36MM PLUS 4 (Hips) IMPLANT
PACK ANTERIOR HIP CUSTOM (KITS) ×1 IMPLANT
SET HNDPC FAN SPRY TIP SCT (DISPOSABLE) ×1 IMPLANT
STAPLER SKIN PROX 35W (STAPLE) IMPLANT
STEM FEMORAL SZ6 HIGH ACTIS (Stem) IMPLANT
STRIP CLOSURE SKIN 1/2X4 (GAUZE/BANDAGES/DRESSINGS) IMPLANT
SUT ETHIBOND NAB CT1 #1 30IN (SUTURE) ×1 IMPLANT
SUT ETHILON 2 0 PS N (SUTURE) IMPLANT
SUT MNCRL AB 4-0 PS2 18 (SUTURE) IMPLANT
SUT VIC AB 0 CT1 36 (SUTURE) ×1 IMPLANT
SUT VIC AB 1 CT1 36 (SUTURE) ×1 IMPLANT
SUT VIC AB 2-0 CT1 TAPERPNT 27 (SUTURE) ×2 IMPLANT
TRAY FOLEY MTR SLVR 14FR STAT (SET/KITS/TRAYS/PACK) IMPLANT
TRAY FOLEY MTR SLVR 16FR STAT (SET/KITS/TRAYS/PACK) ×1 IMPLANT
YANKAUER SUCT BULB TIP NO VENT (SUCTIONS) ×1 IMPLANT

## 2024-10-29 NOTE — Transfer of Care (Signed)
 Immediate Anesthesia Transfer of Care Note  Patient: [name]  Procedure(s) Performed: RIGHT TOTAL HIP ARTHROPLASTY, ANTERIOR APPROACH (Right: Hip)  Patient Location: PACU  Anesthesia Type:Spinal  Level of Consciousness: awake  Airway & Oxygen Therapy: Patient Spontanous Breathing and Patient connected to nasal cannula oxygen  Post-op Assessment: Report given to RN and Post -op Vital signs reviewed and stable  Post vital signs: Reviewed and stable  Last Vitals:  Vitals Value Taken Time  BP    Temp    Pulse 81 10/29/24 13:15  Resp 15 10/29/24 13:15  SpO2 88 % 10/29/24 13:15  Vitals shown include unfiled device data.  Last Pain:  Vitals:   10/29/24 0938  TempSrc:   PainSc: 8          Complications: No notable events documented.

## 2024-10-29 NOTE — Anesthesia Postprocedure Evaluation (Signed)
 Anesthesia Post Note  Patient: AOI KOUNS  Procedure(s) Performed: RIGHT TOTAL HIP ARTHROPLASTY, ANTERIOR APPROACH (Right: Hip)     Patient location during evaluation: PACU Anesthesia Type: MAC and Spinal Level of consciousness: oriented and awake and alert Pain management: pain level controlled Vital Signs Assessment: post-procedure vital signs reviewed and stable Respiratory status: spontaneous breathing, respiratory function stable and patient connected to nasal cannula oxygen Cardiovascular status: blood pressure returned to baseline and stable Postop Assessment: no headache, no backache and no apparent nausea or vomiting Anesthetic complications: no   No notable events documented.  Last Vitals:  Vitals:   10/29/24 1449 10/29/24 1504  BP: (!) 100/59 110/68  Pulse: 61   Resp: 16 16  Temp:  36.4 C  SpO2: 94% 95%    Last Pain:  Vitals:   10/29/24 1504  TempSrc: Oral  PainSc:                  Genny Caulder S

## 2024-10-29 NOTE — Plan of Care (Signed)
   Problem: Coping: Goal: Level of anxiety will decrease Outcome: Progressing   Problem: Pain Managment: Goal: General experience of comfort will improve and/or be controlled Outcome: Progressing   Problem: Safety: Goal: Ability to remain free from injury will improve Outcome: Progressing

## 2024-10-29 NOTE — Evaluation (Signed)
 Physical Therapy Evaluation Patient Details Name: [name] MRN: [id number] DOB: 1976-03-25 Today's Date: 10/29/2024  History of Present Illness  48 yo female presents to therapy s/p R THA, anterior approach on 10/29/2024 due to failure of conservative measures. Pt PMH includes but is not limited to: chronic pain, lumbar radiculopathy, OA, graves dz, hyperthyroidism, asthma, diverticulitis, GERD, gastric sleeve and anemia.  Clinical Impression      SHEMICKA COHRS is a 48 y.o. female POD 0 s/p R THA. Patient reports mod I with mobility at baseline. Patient is now limited by functional impairments (see PT problem list below) and requires CGA for bed mobility and min A for transfers. Patient was able to ambulate 40 feet with RW and CGA level of assist. Patient instructed in exercise to facilitate ROM and circulation to manage edema.Patient will benefit from continued skilled PT interventions to address impairments and progress towards PLOF. Acute PT will follow to progress mobility and stair training in preparation for safe discharge home with family and social support with [facility] services.     If plan is discharge home, recommend the following: A little help with walking and/or transfers;A little help with bathing/dressing/bathroom;Assistance with cooking/housework;Assist for transportation;Help with stairs or ramp for entrance   Can travel by private vehicle        Equipment Recommendations Rolling walker (2 wheels);BSC/3in1  Recommendations for Other Services       Functional Status Assessment Patient has had a recent decline in their functional status and demonstrates the ability to make significant improvements in function in a reasonable and predictable amount of time.     Precautions / Restrictions Precautions Precautions: Fall Restrictions Weight Bearing Restrictions Per Provider Order: Yes RLE Weight Bearing Per Provider Order: Weight bearing as tolerated       Mobility  Bed Mobility Overal bed mobility: Needs Assistance Bed Mobility: Supine to Sit     Supine to sit: Contact guard, HOB elevated     General bed mobility comments: min cues    Transfers Overall transfer level: Needs assistance Equipment used: Rolling walker (2 wheels) Transfers: Sit to/from Stand Sit to Stand: Min assist           General transfer comment: min cues and A for pull to stand at RW deficits with power up and hip extension    Ambulation/Gait Ambulation/Gait assistance: Contact guard assist Gait Distance (Feet): 40 Feet Assistive device: Rolling walker (2 wheels) Gait Pattern/deviations: Step-to pattern, Decreased stance time - right, Antalgic, Trunk flexed Gait velocity: decreased     General Gait Details: slight trunk flexion with B UE support at RW to offload R LE in stance phase, min cues for posture, proper distance from RW and RW management with pt demonstrating R toe in  Stairs            Wheelchair Mobility     Tilt Bed    Modified Rankin (Stroke Patients Only)       Balance Overall balance assessment: Needs assistance Sitting-balance support: Feet supported Sitting balance-Leahy Scale: Good     Standing balance support: Bilateral upper extremity supported, During functional activity, Reliant on assistive device for balance Standing balance-Leahy Scale: Poor                               Pertinent Vitals/Pain Pain Assessment Pain Assessment: 0-10 Pain Score: 7  Pain Location: R hip Pain Descriptors / Indicators: Aching, Constant, Discomfort, Burning, Grimacing,  Operative site guarding, Numbness (pinch) Pain Intervention(s): Limited activity within patient's tolerance, Monitored during session, Premedicated before session, Repositioned, Ice applied    Home Living Family/patient expects to be discharged to:: Private residence Living Arrangements: Alone Available Help at Discharge:  Family;Friend(s);Neighbor Type of Home: Apartment Home Access: Stairs to enter Entrance Stairs-Rails: Right;Left;Can reach both Secretary/administrator of Steps: 3   Home Layout: One level Home Equipment: Cane - single point      Prior Function Prior Level of Function : Independent/Modified Independent             Mobility Comments: mod I with SPC for all ADLs, self care tasks and IADLs       Extremity/Trunk Assessment        Lower Extremity Assessment Lower Extremity Assessment: RLE deficits/detail RLE Deficits / Details: ankle DF/PF 5/5 RLE Sensation: decreased light touch;decreased proprioception (B proximal LE and buttock region not interfearing with abiltiy to ambulate at time of eval)    Cervical / Trunk Assessment Cervical / Trunk Assessment: Normal  Communication   Communication Communication: No apparent difficulties    Cognition Arousal: Alert Behavior During Therapy: WFL for tasks assessed/performed   PT - Cognitive impairments: No apparent impairments                         Following commands: Intact       Cueing       General Comments      Exercises Total Joint Exercises Ankle Circles/Pumps: AROM, Both, 10 reps   Assessment/Plan    PT Assessment Patient needs continued PT services  PT Problem List Decreased strength;Decreased range of motion;Decreased activity tolerance;Decreased balance;Decreased mobility;Decreased coordination;Pain       PT Treatment Interventions DME instruction;Gait training;Stair training;Functional mobility training;Therapeutic activities;Therapeutic exercise;Balance training;Neuromuscular re-education;Patient/family education;Modalities    PT Goals (Current goals can be found in the Care Plan section)  Acute Rehab PT Goals Patient Stated Goal: to be able to keep up with the grandchildren run, get up and down off the floor, have knee and feet surgery PT Goal Formulation: With patient Time For Goal  Achievement: 11/12/24 Potential to Achieve Goals: Good    Frequency 7X/week     Co-evaluation               AM-PAC PT 6 Clicks Mobility  Outcome Measure Help needed turning from your back to your side while in a flat bed without using bedrails?: None Help needed moving from lying on your back to sitting on the side of a flat bed without using bedrails?: A Little Help needed moving to and from a bed to a chair (including a wheelchair)?: A Little Help needed standing up from a chair using your arms (e.g., wheelchair or bedside chair)?: A Little Help needed to walk in hospital room?: A Little Help needed climbing 3-5 steps with a railing? : Total 6 Click Score: 17    End of Session Equipment Utilized During Treatment: Gait belt Activity Tolerance: Patient tolerated treatment well Patient left: in chair;with call bell/phone within reach Nurse Communication: Mobility status;Patient requests pain meds PT Visit Diagnosis: Unsteadiness on feet (R26.81);Other abnormalities of gait and mobility (R26.89);Muscle weakness (generalized) (M62.81);Difficulty in walking, not elsewhere classified (R26.2);Pain Pain - Right/Left: Right Pain - part of body: Hip;Leg    Time: 8468-8442 PT Time Calculation (min) (ACUTE ONLY): 26 min   Charges:   PT Evaluation $PT Eval Low Complexity: 1 Low PT Treatments $Gait Training: 8-22 mins PT  General Charges $$ ACUTE PT VISIT: 1 Visit         [place], PT Acute Rehab   [place] VEAR Drone 10/29/2024, 4:27 PM

## 2024-10-29 NOTE — Anesthesia Procedure Notes (Signed)
 Spinal  Patient location during procedure: OR Start time: 10/29/2024 11:16 AM End time: 10/29/2024 11:18 AM Reason for block: surgical anesthesia Staffing Performed: anesthesiologist  Anesthesiologist: Maryclare Cornet, MD Performed by: Maryclare Cornet, MD Authorized by: Maryclare Cornet, MD   Preanesthetic Checklist Completed: patient identified, IV checked, risks and benefits discussed, surgical consent, monitors and equipment checked, pre-op evaluation and timeout performed Spinal Block Patient position: sitting Prep: DuraPrep Patient monitoring: cardiac monitor, continuous pulse ox and blood pressure Approach: midline Location: L3-4 Injection technique: single-shot Needle Needle type: Pencan  Needle gauge: 24 G Needle length: 9 cm Assessment Sensory level: T10 Events: CSF return Additional Notes Functioning IV was confirmed and monitors were applied. Sterile prep and drape, including hand hygiene and sterile gloves were used. The patient was positioned and the spine was prepped. The skin was anesthetized with lidocaine .  Free flow of clear CSF was obtained prior to injecting local anesthetic into the CSF.  The spinal needle aspirated freely following injection.  The needle was carefully withdrawn.  The patient tolerated the procedure well.

## 2024-10-29 NOTE — Anesthesia Procedure Notes (Signed)
 Procedure Name: MAC Date/Time: 10/29/2024 11:19 AM  Performed by: Judythe Tanda Aran, CRNAPre-anesthesia Checklist: Patient identified, Emergency Drugs available, Suction available and Patient being monitored Patient Re-evaluated:Patient Re-evaluated prior to induction Oxygen Delivery Method: Simple face mask

## 2024-10-29 NOTE — Op Note (Signed)
 Operative Note  Date of operation: 10/29/2024 Preoperative diagnosis: Right hip primary osteoarthritis with protrusio Postoperative diagnosis: Same  Procedure: Right direct anterior total hip arthroplasty  Implants: Implant Name Type Inv. Item Serial No. Manufacturer Lot No. LRB No. Used Action  CUP ACETAB W/GRIPTION 54 - [id number] Plate CUP ACETAB W/GRIPTION 54  [employer] [id number] Right 1 Implanted  LINER NEUTRAL 54X36MM PLUS 4 - [id number] Hips LINER NEUTRAL 54X36MM PLUS 4  DEPUY [employer]TH[employer] [id number] Right 1 Implanted  STEM FEMORAL SZ6 HIGH ACTIS - [id number] Stem STEM FEMORAL SZ6 HIGH ACTIS  DEPUY ORTH[employer] [id number] Right 1 Implanted  HEAD FEM CER ARTIC -2 36 12/14 - [id number] Head HEAD FEM CER ARTIC -2 36 12/14  [employer] [id number] Right 1 Implanted   Surgeon: Lonni GRADE. Vernetta, MD Assistant: Tory Gaskins, PA-C  Anesthesia: Spinal EBL: 100 cc Antibiotics: IV Ancef  Complications: None  Indications: The patient is an active 48 year old female with debilitating end-stage arthritis involving her right hip with also protrusio of the hip ball into the acetabulum.  She has someone who used to be morbidly obese with a weight of close to 400 pounds.  She has lost a significant mount of weight and her BMI is down to 37.43.  However she does have debilitating end-stage arthritis of her right hip.  Her leg lengths are different with her right lower extremity shortened left and she walks with a lurch and Trendelenburg gait.  She has severe end-stage arthritis of the right hip on x-rays with protrusio as well.  At this point we have recommended total hip arthroplasty.  She understands this to be quite difficult and even with her continued weight loss, she is still morbidly obese and with the protrusio has a heightened risk of acute blood loss anemia, nerve or vessel injury, fracture, infection, DVT, dislocation, leg length differences and wound healing issues as well as implant  failure.  She understands that our goals are hopefully decreased pain, improved mobility and improved quality of life.  Procedure description: After informed consent was obtained and appropriate right hip was marked, the patient was brought to the operating room and set up on the operative table where spinal anesthesia was obtained.  She was then laid in supine position on the operating table and a Foley catheter was placed.  Traction boots were placed on both her feet and next she was placed supine on the Hana fracture table with a perineal post in place in both legs and inline skeletal traction devices but no traction applied.  Her right operative hip and pelvis were assessed radiographically and then prepped and draped with DuraPrep and sterile drapes.  Timeout was called and she was notified to correct patient and correct right hip.  An incision was then made just inferior and posterior to the ASIS and carried slightly obliquely down the leg.  Dissection was carried down to the tensor fascia lata muscle and tensor fascia was then divided longitudinally to proceed with a direct tender approach the hip.  Circumflex vessels were identified and cauterized.  The hip capsule identified and it was quite difficult to get to but we were able to and was opened up.  There was a moderate effusion encountered.  We then placed a Co. retractor on the medial lateral femoral neck and made a femoral neck cut with an oscillating saw just proximal to the lesser trochanter.  This cut was completed with an osteotome.  A corkscrew gauze placed in the femoral head and it was quite  difficult to get out but we were able to eventually just get the femoral head out in its entirety and was completely devoid of cartilage.  We then placed a bent Hohmann over the medial acetabular rim and removed what was left over the asked our labrum as well as osteophytes around the acetabulum and bony debris.  We then gently reamed from a size 43 reamer  and stepwise increments going up to a size 53 reamer with all reamers placed under direct visualization and the last reamer placed under direct fluoroscopy in order to obtain the depth reaming, the inclination and the anteversion.  The real DePuy sector GRIPTION asked component size 54 was placed difficulty followed by a 36+4 polythene liner.  Attention was then turned to the femur.  With the right leg externally rotated to 140 degrees, extended and adducted, a Mueller retractor was placed medially and a Hohmann tractor by the greater trochanter.  The lateral joint capsule was released and a box cutting osteotome was used into the femoral canal.  Broaching was then initiated using the Actis broaching system from a size 0 going up to a size 6.  With a size 6 and placed we trialed a standard offset femoral neck and a 36-2 trial head ball.  We brought the right leg over and up with traction and internal rotation reduced in the pelvis.  Based on radiographic and clinical assessment we felt like we needed more offset.  We dislocated the hip and remove the trial components.  We then placed the real Actis femoral component with high offset size 6 and the real 36-2 ceramic head ball.  Again this was reduced in pelvis and replaced with stability assessed mechanically and radiographically as well as length and offset.  The soft tissue was then irrigated normal saline solution.  A small portion of the joint capsule was able to be closed with interrupted #1 Ethibond suture followed by normal Vicryl close the tensor fascia.  0 Vicryl was used to close deep tissue and 2-0 Vicryl was used to close subcutaneous tissue.  The skin was closed with interrupted 2-0 nylon suture.  Xeroform and an Aquacel dressing was applied.  The patient was taken off the Hana table and taken the recovery room.  Tory Gaskins, PA-C did assist during the entire case and beginning and his assistance was crucial and medically necessary for soft tissue  management and retraction, helping guide implant placement and a layered closure of the wound.

## 2024-10-29 NOTE — TOC Transition Note (Signed)
 Transition of Care [facility]) - Discharge Note   Patient Details  Name: [name] MRN: [id number] Date of Birth: [birth year]  [facility] CM/SW Contact:  NORMAN ASPEN, LCSW Phone Number: 10/29/2024, 3:13 PM   Clinical Narrative:    Met with pt who reports she has not received DME that had been ordered prior to surgery.  Needing RW and bedside commode.  No DME agency preference.  [facility] will not be back to the hospital today so have placed new order with [facility] for delivery to room prior to dc.  Pt aware that HHPT prearranged with [facility].  No further IP CM needs.   Final next level of care: Home w Home Health Services Barriers to Discharge: No Barriers Identified   Patient Goals and CMS Choice Patient states their goals for this hospitalization and ongoing recovery are:: return home          Discharge Placement                       Discharge Plan and Services Additional resources added to the After Visit Summary for                  DME Arranged: Walker rolling, 3-N-1 DME Agency: [facility] Date DME Agency Contacted: 10/29/24 Time DME Agency Contacted: [id number] Representative spoke with at DME Agency: [facility] Arranged: PT HH Agency: [facility]        Social Drivers of Health (SDOH) Interventions SDOH Screenings   Food Insecurity: Food Insecurity Present (10/07/2024)  Housing: Low Risk  (10/07/2024)  Transportation Needs: No Transportation Needs (10/07/2024)  Utilities: Not At Risk (03/11/2024)  Alcohol Screen: Low Risk  (02/20/2024)  Depression (PHQ2-9): Low Risk  (08/26/2024)  Recent Concern: Depression (PHQ2-9) - High Risk (06/07/2024)  Financial Resource Strain: Medium Risk (10/07/2024)  Physical Activity: Inactive (10/07/2024)  Social Connections: Socially Isolated (10/07/2024)  Stress: No Stress Concern Present (10/07/2024)  Tobacco Use: Medium Risk (10/29/2024)  Health Literacy: Adequate Health Literacy (02/20/2024)      Readmission Risk Interventions    03/10/2024    9:41 AM  Readmission Risk Prevention Plan  Post Dischage Appt Complete  Medication Screening Complete  Transportation Screening Complete

## 2024-10-29 NOTE — Anesthesia Preprocedure Evaluation (Signed)
 Anesthesia Evaluation  Patient identified by MRN, date of birth, ID band Patient awake    Reviewed: Allergy & Precautions, H&P , NPO status , Patient's Chart, lab work & pertinent test results  Airway Mallampati: II   Neck ROM: full    Dental   Pulmonary asthma , former smoker   breath sounds clear to auscultation       Cardiovascular negative cardio ROS  Rhythm:regular Rate:Normal     Neuro/Psych   Anxiety      Neuromuscular disease    GI/Hepatic ,GERD  ,,  Endo/Other   Hyperthyroidism Class 3 obesity  Renal/GU      Musculoskeletal  (+) Arthritis ,    Abdominal   Peds  Hematology   Anesthesia Other Findings   Reproductive/Obstetrics                              Anesthesia Physical Anesthesia Plan  ASA: 2  Anesthesia Plan: MAC and Spinal   Post-op Pain Management:    Induction: Intravenous  PONV Risk Score and Plan: 2 and Propofol  infusion, Ondansetron , Treatment may vary due to age or medical condition and Midazolam   Airway Management Planned: Simple Face Mask  Additional Equipment:   Intra-op Plan:   Post-operative Plan:   Informed Consent: I have reviewed the patients History and Physical, chart, labs and discussed the procedure including the risks, benefits and alternatives for the proposed anesthesia with the patient or authorized representative who has indicated his/her understanding and acceptance.     Dental advisory given  Plan Discussed with: CRNA, Anesthesiologist and Surgeon  Anesthesia Plan Comments:         Anesthesia Quick Evaluation

## 2024-10-29 NOTE — Interval H&P Note (Signed)
 History and Physical Interval Note: The patient understands that she is here today for a right total hip replacement to treat her significant right hip pain and arthritis.  There has been no acute or interval change in her medical status.  The risks and benefits of surgery have been discussed in detail and informed consent has been obtained.  The right operative hip has been marked.  10/29/2024 9:57 AM  [name]  has presented today for surgery, with the diagnosis of Osteoarthritis Right Hip.  The various methods of treatment have been discussed with the patient and family. After consideration of risks, benefits and other options for treatment, the patient has consented to  Procedure(s): ARTHROPLASTY, HIP, TOTAL, ANTERIOR APPROACH (Right) as a surgical intervention.  The patient's history has been reviewed, patient examined, no change in status, stable for surgery.  I have reviewed the patient's chart and labs.  Questions were answered to the patient's satisfaction.     [name]

## 2024-10-30 DIAGNOSIS — M1611 Unilateral primary osteoarthritis, right hip: Secondary | ICD-10-CM | POA: Diagnosis not present

## 2024-10-30 LAB — CBC
HCT: 37.2 % (ref 36.0–46.0)
Hemoglobin: 11.7 g/dL — ABNORMAL LOW (ref 12.0–15.0)
MCH: 29.9 pg (ref 26.0–34.0)
MCHC: 31.5 g/dL (ref 30.0–36.0)
MCV: 95.1 fL (ref 80.0–100.0)
Platelets: 163 K/uL (ref 150–400)
RBC: 3.91 MIL/uL (ref 3.87–5.11)
RDW: 14.2 % (ref 11.5–15.5)
WBC: 15.1 K/uL — ABNORMAL HIGH (ref 4.0–10.5)
nRBC: 0 % (ref 0.0–0.2)

## 2024-10-30 LAB — BASIC METABOLIC PANEL WITH GFR
Anion gap: 9 (ref 5–15)
BUN: 12 mg/dL (ref 6–20)
CO2: 21 mmol/L — ABNORMAL LOW (ref 22–32)
Calcium: 8.7 mg/dL — ABNORMAL LOW (ref 8.9–10.3)
Chloride: 107 mmol/L (ref 98–111)
Creatinine, Ser: 0.51 mg/dL (ref 0.44–1.00)
GFR, Estimated: >60 mL/min (ref >60)
Glucose, Bld: 135 mg/dL — ABNORMAL HIGH (ref 70–99)
Potassium: 4.1 mmol/L (ref 3.5–5.1)
Sodium: 137 mmol/L (ref 135–145)

## 2024-10-30 MED ORDER — METHOCARBAMOL 500 MG PO TABS: 500.0000 mg | ORAL_TABLET | Freq: Four times a day (QID) | ORAL | 1 refills | Status: AC | PRN

## 2024-10-30 MED ORDER — ASPIRIN 81 MG PO CHEW: 81.0000 mg | CHEWABLE_TABLET | Freq: Two times a day (BID) | ORAL | 0 refills | Status: AC

## 2024-10-30 MED ORDER — OXYCODONE HCL 5 MG PO TABS: 5.0000 mg | ORAL_TABLET | Freq: Four times a day (QID) | ORAL | 0 refills | Status: DC | PRN

## 2024-10-30 NOTE — Plan of Care (Signed)
 progressing

## 2024-10-30 NOTE — Plan of Care (Signed)
   Problem: Coping: Goal: Level of anxiety will decrease Outcome: Progressing   Problem: Pain Managment: Goal: General experience of comfort will improve and/or be controlled Outcome: Progressing   Problem: Safety: Goal: Ability to remain free from injury will improve Outcome: Progressing

## 2024-10-30 NOTE — Discharge Instructions (Signed)

## 2024-10-30 NOTE — Progress Notes (Signed)
 Physical Therapy Treatment Patient Details Name: [name] MRN: [id number] DOB: Oct 27, 1976 Today's Date: 10/30/2024   History of Present Illness 48 yo female presents to therapy s/p R THA, anterior approach on 10/29/2024 due to failure of conservative measures. Pt PMH includes but is not limited to: chronic pain, lumbar radiculopathy, OA, graves dz, hyperthyroidism, asthma, diverticulitis, GERD, gastric sleeve and anemia.    PT Comments  Pt is progressing toward acute PT goals this session with progression of ambulation distance and to stair training. Pt performed sit to stand transfers with MIN A and ambulated ~143ft with CGA, no LOB observed. Pt performed  stair negotiation with MIN A progressing to CGA and cues for sequencing and safe hand placement. Pt was able to verbalize and demonstrate correct sequencing pattern for stair negotiation following education. PT reviewed LE HEP, pt demonstrated understanding. Pt will benefit from continued skilled PT to increase their independence and maximize safety with mobility.      If plan is discharge home, recommend the following: A little help with walking and/or transfers;A little help with bathing/dressing/bathroom;Assistance with cooking/housework;Assist for transportation;Help with stairs or ramp for entrance   Can travel by private vehicle        Equipment Recommendations  Rolling walker (2 wheels);BSC/3in1    Recommendations for Other Services       Precautions / Restrictions Precautions Precautions: Fall Restrictions Weight Bearing Restrictions Per Provider Order: Yes RLE Weight Bearing Per Provider Order: Weight bearing as tolerated     Mobility  Bed Mobility               General bed mobility comments: Pt in recliner pre/post session    Transfers Overall transfer level: Needs assistance Equipment used: Rolling walker (2 wheels) Transfers: Sit to/from Stand Sit to Stand: Min assist           General  transfer comment: MIN A with use of B UEs for power up to stand    Ambulation/Gait Ambulation/Gait assistance: Contact guard assist Gait Distance (Feet): 160 Feet Assistive device: Rolling walker (2 wheels) Gait Pattern/deviations: Step-to pattern, Decreased stance time - right, Antalgic, Trunk flexed Gait velocity: decreased     General Gait Details: slight trunk flexion with  increased B UE support on RW to offload R LE in stance phase for assist with pain management. Initially with TDWB on R LE due to pain, improved with cuing and increased distance.   Stairs Stairs: Yes Stairs assistance: Min assist Stair Management: Two rails, Step to pattern, Forwards Number of Stairs: 10 (5x2)  General stair comments: Progressed to CGA on 2nd trial. cues for sequencing and hand placement. Positioning of family when assisting upon d/c.   Wheelchair Mobility     Tilt Bed    Modified Rankin (Stroke Patients Only)       Balance Overall balance assessment: Needs assistance Sitting-balance support: Feet supported Sitting balance-Leahy Scale: Good     Standing balance support: Bilateral upper extremity supported, During functional activity, Reliant on assistive device for balance Standing balance-Leahy Scale: Poor                              Communication Communication Communication: No apparent difficulties  Cognition Arousal: Alert Behavior During Therapy: WFL for tasks assessed/performed   PT - Cognitive impairments: No apparent impairments  Following commands: Intact      Cueing    Exercises Total Joint Exercises Ankle Circles/Pumps: AROM, Both, 20 reps, Seated Quad Sets: AROM, Right, 10 reps, Seated Heel Slides: AAROM, Right, 10 reps, Seated    General Comments        Pertinent Vitals/Pain Pain Assessment Pain Assessment: 0-10 Pain Score: 8  Pain Location: R hip (improved with mobility) Pain Descriptors /  Indicators: Aching, Constant, Discomfort, Burning, Grimacing Pain Intervention(s): Limited activity within patient's tolerance, Monitored during session, Repositioned, Patient requesting pain meds-RN notified, Ice applied    Home Living                          Prior Function            PT Goals (current goals can now be found in the care plan section) Acute Rehab PT Goals Patient Stated Goal: to be able to keep up with the grandchildren run, get up and down off the floor, have knee and feet surgery PT Goal Formulation: With patient Time For Goal Achievement: 11/12/24 Potential to Achieve Goals: Good Progress towards PT goals: Progressing toward goals    Frequency    7X/week      PT Plan      Co-evaluation              AM-PAC PT 6 Clicks Mobility   Outcome Measure  Help needed turning from your back to your side while in a flat bed without using bedrails?: None Help needed moving from lying on your back to sitting on the side of a flat bed without using bedrails?: A Little Help needed moving to and from a bed to a chair (including a wheelchair)?: A Little Help needed standing up from a chair using your arms (e.g., wheelchair or bedside chair)?: A Little Help needed to walk in hospital room?: A Little Help needed climbing 3-5 steps with a railing? : A Lot 6 Click Score: 18    End of Session Equipment Utilized During Treatment: Gait belt Activity Tolerance: Patient tolerated treatment well Patient left: in chair;with call bell/phone within reach;with family/visitor present Nurse Communication: Mobility status;Patient requests pain meds PT Visit Diagnosis: Unsteadiness on feet (R26.81);Other abnormalities of gait and mobility (R26.89);Muscle weakness (generalized) (M62.81);Difficulty in walking, not elsewhere classified (R26.2);Pain Pain - Right/Left: Right Pain - part of body: Hip;Leg     Time: 8971-8896 PT Time Calculation (min) (ACUTE ONLY): 35  min  Charges:    $Therapeutic Exercise: 8-22 mins $Therapeutic Activity: 8-22 mins PT General Charges $$ ACUTE PT VISIT: 1 Visit                    Tinnie BERRY PT, DPT  Acute Rehabilitation Services  Office [phone]  10/30/2024, 1:17 PM

## 2024-10-30 NOTE — Progress Notes (Signed)
 Subjective: 1 Day Post-Op Procedure(s) (LRB): RIGHT TOTAL HIP ARTHROPLASTY, ANTERIOR APPROACH (Right) Patient reports pain as moderate.  Currently getting ready to get up with therapy.  Objective: Vital signs in last 24 hours: Temp:  [97.4 F (36.3 C)-99 F (37.2 C)] 97.9 F (36.6 C) (12/06 0952) Pulse Rate:  [54-81] 81 (12/06 0952) Resp:  [11-20] 17 (12/06 0952) BP: (98-150)/(59-90) 118/70 (12/06 0952) SpO2:  [92 %-99 %] 96 % (12/06 0952) Weight:  [108.4 kg] 108.4 kg (12/05 1547)  Intake/Output from previous day: 12/05 0701 - 12/06 0700 In: 3097.5 [P.O.:480; I.V.:2317.4; IV Piggyback:300.1] Out: 775 [Urine:675; Blood:100] Intake/Output this shift: Total I/O In: 120 [P.O.:120] Out: -   Recent Labs    10/30/24 0354  HGB 11.7*   Recent Labs    10/30/24 0354  WBC 15.1*  RBC 3.91  HCT 37.2  PLT 163   Recent Labs    10/30/24 0354  NA 137  K 4.1  CL 107  CO2 21*  BUN 12  CREATININE 0.51  GLUCOSE 135*  CALCIUM 8.7*   No results for input(s): LABPT, INR in the last 72 hours.  Sensation intact distally Intact pulses distally Dorsiflexion/Plantar flexion intact Incision: scant drainage   Assessment/Plan: 1 Day Post-Op Procedure(s) (LRB): RIGHT TOTAL HIP ARTHROPLASTY, ANTERIOR APPROACH (Right) Up with therapy Plan for discharge tomorrow Discharge home with home health      [name] Poli 10/30/2024, 10:33 AM

## 2024-10-30 NOTE — Care Management Obs Status (Signed)
 MEDICARE OBSERVATION STATUS NOTIFICATION   Patient Details  Name: [name] MRN: [id number] Date of Birth: 1976-04-06   Medicare Observation Status Notification Given:  Yes    Alysa Duca I Jarian Longoria, LCSW 10/30/2024, 10:00 AM

## 2024-10-30 NOTE — Progress Notes (Signed)
 Physical Therapy Treatment Patient Details Name: [name] MRN: [id number] DOB: October 20, 1976 Today's Date: 10/30/2024   History of Present Illness 48 yo female presents to therapy s/p R THA, anterior approach on 10/29/2024 due to failure of conservative measures. Pt PMH includes but is not limited to: chronic pain, lumbar radiculopathy, OA, graves dz, hyperthyroidism, asthma, diverticulitis, GERD, gastric sleeve and anemia.    PT Comments  PT session focused on ambulation, stair training, and progression of HEP. Pt is progressing toward acute PT goals this session and performed sit to supine with MIN A for R LE progression, sit to stand transfers with CGA-close supervision with use of UEs for power up. Pt ambulated ~170 total during session with GA progressing to close supervision and performed stair training with CGA and good carryover of education from previous session. PT reviewed LE HEP, pt demonstrated understanding and reports improved confidence with mobility after PT sessions today. Pt will benefit from continued skilled PT to increase their independence and maximize safety with mobility.       If plan is discharge home, recommend the following: A little help with walking and/or transfers;A little help with bathing/dressing/bathroom;Assistance with cooking/housework;Assist for transportation;Help with stairs or ramp for entrance   Can travel by private vehicle        Equipment Recommendations  Rolling walker (2 wheels);BSC/3in1    Recommendations for Other Services       Precautions / Restrictions Precautions Precautions: Fall Restrictions Weight Bearing Restrictions Per Provider Order: Yes RLE Weight Bearing Per Provider Order: Weight bearing as tolerated     Mobility  Bed Mobility Overal bed mobility: Needs Assistance Bed Mobility: Sit to Supine       Sit to supine: Min assist   General bed mobility comments: Pt sitting EOB with care partner prepping for  ambulation to bathroom upon entry. Assist for bringing R LE onto EOB from PT despite attempts with use of gait belt to assist.    Transfers Overall transfer level: Needs assistance Equipment used: Rolling walker (2 wheels) Transfers: Sit to/from Stand Sit to Stand: Contact guard assist           General transfer comment: TS x2. CGA from EOB, close supervision from toilet.  use of B UEs for power up to stand    Ambulation/Gait Ambulation/Gait assistance: Contact guard assist Gait Distance (Feet): 170 Feet Assistive device: Rolling walker (2 wheels) Gait Pattern/deviations: Step-to pattern, Decreased stance time - right, Antalgic, Trunk flexed Gait velocity: decreased     General Gait Details: slight trunk flexion with  increased B UE support on RW to offload R LE in stance phase for assist with pain management. Improved gait mechanics and able to tolerate increased weight through R LE prior to previous session. progresed to close supervision.   Stairs Stairs: Yes Stairs assistance: Contact guard assist Stair Management: Two rails, Step to pattern, Forwards Number of Stairs: 10 (5x2) General stair comments: Good carryover of education with sequencing and hand placement. Review of positioning of family when assisting upon d/c.   Wheelchair Mobility     Tilt Bed    Modified Rankin (Stroke Patients Only)       Balance Overall balance assessment: Needs assistance Sitting-balance support: Feet supported Sitting balance-Leahy Scale: Good     Standing balance support: Bilateral upper extremity supported, During functional activity, Reliant on assistive device for balance Standing balance-Leahy Scale: Fair Standing balance comment: able to perform static stand for brief periods noted during session  Communication Communication Communication: No apparent difficulties  Cognition Arousal: Alert Behavior During Therapy: WFL for tasks  assessed/performed   PT - Cognitive impairments: No apparent impairments                         Following commands: Intact      Cueing    Exercises Total Joint Exercises Ankle Circles/Pumps: AROM, Both, 20 reps, Supine Quad Sets: AROM, Right, 10 reps, Supine Short Arc Quad: AROM, Right, 10 reps, Supine Heel Slides: AAROM, Right, 10 reps, Supine Hip ABduction/ADduction: AAROM, Right, 10 reps, Supine    General Comments        Pertinent Vitals/Pain Pain Assessment Pain Assessment: 0-10 Pain Score: 9  Pain Location: R hip (7 at beginning, 9 after mobility) Pain Descriptors / Indicators: Aching, Constant, Burning, Discomfort, Tender Pain Intervention(s): Limited activity within patient's tolerance, Monitored during session, Repositioned    Home Living                          Prior Function            PT Goals (current goals can now be found in the care plan section) Acute Rehab PT Goals Patient Stated Goal: to be able to keep up with the grandchildren run, get up and down off the floor, have knee and feet surgery PT Goal Formulation: With patient Time For Goal Achievement: 11/12/24 Potential to Achieve Goals: Good Progress towards PT goals: Progressing toward goals    Frequency    7X/week      PT Plan      Co-evaluation              AM-PAC PT 6 Clicks Mobility   Outcome Measure  Help needed turning from your back to your side while in a flat bed without using bedrails?: None Help needed moving from lying on your back to sitting on the side of a flat bed without using bedrails?: A Little Help needed moving to and from a bed to a chair (including a wheelchair)?: A Little Help needed standing up from a chair using your arms (e.g., wheelchair or bedside chair)?: A Little Help needed to walk in hospital room?: A Little Help needed climbing 3-5 steps with a railing? : A Little 6 Click Score: 19    End of Session Equipment  Utilized During Treatment: Gait belt Activity Tolerance: Patient tolerated treatment well Patient left: with call bell/phone within reach;in bed;with chair alarm set Nurse Communication: Mobility status;Patient requests pain meds PT Visit Diagnosis: Unsteadiness on feet (R26.81);Other abnormalities of gait and mobility (R26.89);Muscle weakness (generalized) (M62.81);Difficulty in walking, not elsewhere classified (R26.2);Pain Pain - Right/Left: Right Pain - part of body: Hip;Leg     Time: 8553-8475 PT Time Calculation (min) (ACUTE ONLY): 38 min  Charges:    $Gait Training: 8-22 mins $Therapeutic Exercise: 8-22 mins $Therapeutic Activity: 8-22 mins PT General Charges $$ ACUTE PT VISIT: 1 Visit                    Tinnie BERRY PT, DPT  Acute Rehabilitation Services  Office [phone] 10/30/2024, 3:42 PM

## 2024-10-31 DIAGNOSIS — M1611 Unilateral primary osteoarthritis, right hip: Secondary | ICD-10-CM | POA: Diagnosis not present

## 2024-10-31 NOTE — Progress Notes (Signed)
 Subjective: 2 Days Post-Op Procedure(s) (LRB): RIGHT TOTAL HIP ARTHROPLASTY, ANTERIOR APPROACH (Right) Patient reports pain as moderate. Mobilized with PT. Doing well. Objective: Vital signs in last 24 hours: Temp:  [97.6 F (36.4 C)-98.1 F (36.7 C)] 98 F (36.7 C) (12/07 0626) Pulse Rate:  [69-91] 82 (12/07 0626) Resp:  [15-20] 20 (12/07 0626) BP: (100-127)/(66-72) 122/67 (12/07 0626) SpO2:  [95 %-96 %] 96 % (12/07 0626)  Intake/Output from previous day: 12/06 0701 - 12/07 0700 In: 1520.3 [P.O.:840; I.V.:680.3] Out: -  Intake/Output this shift: No intake/output data recorded.  Recent Labs    10/30/24 0354  HGB 11.7*   Recent Labs    10/30/24 0354  WBC 15.1*  RBC 3.91  HCT 37.2  PLT 163   Recent Labs    10/30/24 0354  NA 137  K 4.1  CL 107  CO2 21*  BUN 12  CREATININE 0.51  GLUCOSE 135*  CALCIUM 8.7*   No results for input(s): LABPT, INR in the last 72 hours.  Sensation intact distally Intact pulses distally Dorsiflexion/Plantar flexion intact Incision: scant drainage   Assessment/Plan: 2 Days Post-Op Procedure(s) (LRB): RIGHT TOTAL HIP ARTHROPLASTY, ANTERIOR APPROACH (Right) DC today with home health, patient doing well      Adasha Boehme 10/31/2024, 9:07 AM

## 2024-10-31 NOTE — Discharge Summary (Signed)
 Patient ID: PHENIX GREIN MRN: [id number] DOB/AGE: 1976-11-25 48 y.o.  Admit date: 10/29/2024 Discharge date: 10/31/2024  Admission Diagnoses:  Unilateral primary osteoarthritis, right hip  Discharge Diagnoses:  Principal Problem:   Unilateral primary osteoarthritis, right hip Active Problems:   Status post total replacement of right hip   Past Medical History:  Diagnosis Date   Anxiety    Arthritis    Asthma    Diverticulitis of colon    GERD (gastroesophageal reflux disease)    Heart murmur    Hydradenitis    Hyperthyroidism    Graves Disease   Iron (Fe) deficiency anemia    MRSA (methicillin resistant staph aureus) culture positive    Obesity     Surgeries: Procedure(s): RIGHT TOTAL HIP ARTHROPLASTY, ANTERIOR APPROACH on 10/29/2024   Consultants (if any):   Discharged Condition: Improved  Hospital Course: MIKELLA LINSLEY is an 48 y.o. female who was admitted 10/29/2024 with a diagnosis of Unilateral primary osteoarthritis, right hip and went to the operating room on 10/29/2024 and underwent the above named procedures.    She was given perioperative antibiotics:  Anti-infectives (From admission, onward)    Start     Dose/Rate Route Frequency Ordered Stop   10/29/24 1715  ceFAZolin  (ANCEF ) IVPB 2g/100 mL premix        2 g 200 mL/hr over 30 Minutes Intravenous Every 6 hours 10/29/24 1504 10/29/24 2238   10/29/24 0915  ceFAZolin  (ANCEF ) IVPB 2g/100 mL premix        2 g 200 mL/hr over 30 Minutes Intravenous On call to O.R. 10/29/24 9092 10/29/24 1124     .  She was given sequential compression devices, early ambulation, and appropriate chemoprophylaxis for DVT prophylaxis.  She benefited maximally from the hospital stay and there were no complications.    Recent vital signs:  Vitals:   10/30/24 2126 10/31/24 0626  BP: 100/66 122/67  Pulse: 91 82  Resp: 15 20  Temp: 98.1 F (36.7 C) 98 F (36.7 C)  SpO2: 95% 96%    Recent laboratory  studies:  Lab Results  Component Value Date   HGB 11.7 (L) 10/30/2024   HGB 13.7 10/20/2024   HGB 13.4 05/11/2024   Lab Results  Component Value Date   WBC 15.1 (H) 10/30/2024   PLT 163 10/30/2024   No results found for: INR Lab Results  Component Value Date   NA 137 10/30/2024   K 4.1 10/30/2024   CL 107 10/30/2024   CO2 21 (L) 10/30/2024   BUN 12 10/30/2024   CREATININE 0.51 10/30/2024   GLUCOSE 135 (H) 10/30/2024    Discharge Medications:   Allergies as of 10/31/2024       Reactions   Shellfish Allergy Anaphylaxis   Shellfish Protein-containing Drug Products Hives, Itching, Swelling   Minocycline  Other (See Comments)   Skin hyperpigmentation. Can tolerate doxycycline  (it seems) Other Reaction(s): Other (See Comments) Skin hyperpigmentation. Can tolerate doxycycline  (it seems)        Medication List     TAKE these medications    AeroChamber Plus inhaler Use as instructed   albuterol  108 (90 Base) MCG/ACT inhaler Commonly known as: VENTOLIN  HFA Inhale 2 puffs by mouth every 4 hours as needed for wheezing/shortness of breath   albuterol  (2.5 MG/3ML) 0.083% nebulizer solution Commonly known as: PROVENTIL  Inhale 1 vial via nebulizer every 4 hours as needed for wheezing/shortness of breath   aspirin  81 MG chewable tablet Chew 1 tablet (81 mg total)  by mouth 2 (two) times daily.   Avsola  100 MG injection Generic drug: inFLIXimab -axxq Inject into the vein every 28 (twenty-eight) days.   BARIATRIC MULTIVITAMIN/IRON PO Take by mouth.   CALCI-CHEW PO Take 1 tablet by mouth in the morning, at noon, and at bedtime. Bariatric Calcium Chews   cromolyn  4 % ophthalmic solution Commonly known as: OPTICROM  Place 1 drop into both eyes 4 (four) times daily. What changed:  when to take this reasons to take this   Elidel  1 % cream Generic drug: pimecrolimus  Apply to aa's atopic dermatitis QD-BID PRN flares. What changed:  how much to take how to take  this when to take this reasons to take this additional instructions   Enskyce  0.15-30 MG-MCG tablet Generic drug: desogestrel -ethinyl estradiol  Take 1 tablet by mouth daily.   EPINEPHrine  0.3 mg/0.3 mL Soaj injection Commonly known as: EPI-PEN Inject 0.3 mg into the muscle as needed for anaphylaxis.   esomeprazole  40 MG capsule Commonly known as: NEXIUM  Take 1 capsule by mouth twice daily What changed: when to take this   ferrous sulfate  325 (65 FE) MG tablet Commonly known as: FeroSul Take 1 tablet (325 mg total) by mouth daily with breakfast.   mesalamine  0.375 g 24 hr capsule Commonly known as: Apriso  Take 4 capsules (1.5 g total) by mouth daily. NEEDS APPT FOR FURTHER REFILLS   methocarbamol  500 MG tablet Commonly known as: ROBAXIN  Take 1 tablet (500 mg total) by mouth every 6 (six) hours as needed for muscle spasms.   montelukast  10 MG tablet Commonly known as: SINGULAIR  TAKE 1 TABLET BY MOUTH AT  BEDTIME   mupirocin  ointment 2 % Commonly known as: BACTROBAN  Apply 1 Application topically 2 (two) times daily. What changed:  when to take this reasons to take this   Nebulizer Mask Adult/Tubing Misc 1 each by Does not apply route daily.   oxyCODONE  5 MG immediate release tablet Commonly known as: Oxy IR/ROXICODONE  Take 1-2 tablets (5-10 mg total) by mouth every 6 (six) hours as needed for moderate pain (pain score 4-6) (pain score 4-6).   pantoprazole  40 MG tablet Commonly known as: PROTONIX  Take 1 tablet (40 mg total) by mouth daily.   Tezspire  210 MG/1. syringe Generic drug: tezepelumab -ekko INJECT 1 SYRINGE SUBCUTANEOUSLY  EVERY 4 WEEKS   Trulance  3 MG Tabs Generic drug: Plecanatide  TAKE 1 TABLET BY MOUTH DAILY What changed:  how much to take when to take this reasons to take this   Vitamin D  (Ergocalciferol ) 1.25 MG (50000 UNIT) Caps capsule Commonly known as: DRISDOL Take 1 capsule by mouth once a week What changed: when to take this                Durable Medical Equipment  (From admission, onward)           Start     Ordered   10/29/24 1505  DME 3 n 1  Once        10/29/24 1504   10/29/24 1505  DME Walker rolling  Once       Question Answer Comment  Walker: With 5 Inch Wheels   Patient needs a walker to treat with the following condition Status post total replacement of right hip      10/29/24 1504            Diagnostic Studies: DG Pelvis Portable Result Date: 10/29/2024 CLINICAL DATA:  Status post right hip replacement. EXAM: PORTABLE PELVIS 1-2 VIEWS COMPARISON:  Preoperative radiograph. FINDINGS: Right hip  arthroplasty in expected alignment. No periprosthetic lucency or fracture. Recent postsurgical change includes air and edema in the soft tissues. Chronic left hip arthropathy. IMPRESSION: Right hip arthroplasty without immediate postoperative complication. Electronically Signed   By: Andrea Gasman M.D.   On: 10/29/2024 14:16   DG HIP UNILAT WITH PELVIS 1V RIGHT Result Date: 10/29/2024 CLINICAL DATA:  Elective surgery. EXAM: DG HIP (WITH OR WITHOUT PELVIS) 1V RIGHT COMPARISON:  None Available. FINDINGS: Three fluoroscopic spot views of the pelvis and right hip obtained in the operating room. Images during hip arthroplasty. Fluoroscopy time 20 seconds. Dose 7.15 mGy. IMPRESSION: Intraoperative fluoroscopy during right hip arthroplasty. Electronically Signed   By: Andrea Gasman M.D.   On: 10/29/2024 14:14   DG C-Arm 1-60 Min-No Report Result Date: 10/29/2024 Fluoroscopy was utilized by the requesting physician.  No radiographic interpretation.   DG C-Arm 1-60 Min-No Report Result Date: 10/29/2024 Fluoroscopy was utilized by the requesting physician.  No radiographic interpretation.   DG Foot 2 Views Right Result Date: 10/07/2024 Please see detailed radiograph report in office note.  DG Foot 2 Views Left Result Date: 10/07/2024 Please see detailed radiograph report in office  note.   Disposition:      Follow-up Information     Vernetta Lonni GRADE, MD. Go on 11/11/2024.   Specialty: Orthopedic Surgery Why: at 3:00 pm for your first in office post operative appointment with Dr. Vernetta Pass information: [address] [place] [address] [phone]                  Signed: ELSPETH PARKER 10/31/2024, 9:08 AM

## 2024-10-31 NOTE — Progress Notes (Signed)
 Physical Therapy Treatment Patient Details Name: [name] MRN: [id number] DOB: Oct 16, 1976 Today's Date: 10/31/2024   History of Present Illness Pt is 48 yo female presents to therapy s/p R THA, anterior approach on 10/29/2024 due to failure of conservative measures. Pt PMH includes but is not limited to: chronic pain, lumbar radiculopathy, OA, graves dz, hyperthyroidism, asthma, diverticulitis, GERD, gastric sleeve and anemia.    PT Comments  Pt continues to progress toward acute PT goals this session with progression of ambulation distance and decreased assist required for all mobility this date. Pt ambulated total ~269ft with supervision, no LOB observed. Therapeutic seated rest break provided midway through distance and prior to stair training. Pt performed stair negotiation supervision demonstrating good carryover of education for sequencing and proper hand placement. PT reviewed LE HEP, pt demonstrated understanding and with increased independence with performing technique. Pt is currently at mobility level that is adequate for d/c to home with family support. Pt will benefit from continued skilled PT to increase their independence and maximize safety with mobility.      If plan is discharge home, recommend the following: A little help with walking and/or transfers;A little help with bathing/dressing/bathroom;Assistance with cooking/housework;Assist for transportation;Help with stairs or ramp for entrance   Can travel by private vehicle        Equipment Recommendations  Rolling walker (2 wheels);BSC/3in1    Recommendations for Other Services       Precautions / Restrictions Precautions Precautions: Fall Restrictions Weight Bearing Restrictions Per Provider Order: Yes RLE Weight Bearing Per Provider Order: Weight bearing as tolerated     Mobility  Bed Mobility Overal bed mobility: Needs Assistance Bed Mobility: Sit to Supine       Sit to supine: Supervision    General bed mobility comments: Pt uses headboard railing at baseline, single UE use on rail for trunk to upright. Able to progress R LE to EOB without assist from PT or use of gait belt.    Transfers Overall transfer level: Needs assistance Equipment used: Rolling walker (2 wheels) Transfers: Sit to/from Stand Sit to Stand: Supervision           General transfer comment: STS x2. Cues for hand placement.    Ambulation/Gait Ambulation/Gait assistance: Contact guard assist Gait Distance (Feet): 240 Feet Assistive device: Rolling walker (2 wheels) Gait Pattern/deviations: Antalgic, Step-through pattern, Decreased stride length Gait velocity: decreased     General Gait Details: x1 seated rest break midway and performance of stair negotiation.   Stairs   Stairs assistance: Supervision Stair Management: Two rails, Step to pattern, Forwards Number of Stairs: 6 (3x2) General stair comments: Good carryover of education with sequencing and hand placement. Review of positioning of family when assisting upon d/c.   Wheelchair Mobility     Tilt Bed    Modified Rankin (Stroke Patients Only)       Balance Overall balance assessment: Needs assistance Sitting-balance support: Feet supported Sitting balance-Leahy Scale: Good     Standing balance support: Bilateral upper extremity supported, During functional activity, Reliant on assistive device for balance Standing balance-Leahy Scale: Fair Standing balance comment: able to perform static stand for brief periods noted during session                            Communication Communication Communication: No apparent difficulties  Cognition Arousal: Alert Behavior During Therapy: WFL for tasks assessed/performed   PT - Cognitive impairments: No apparent impairments  Following commands: Intact      Cueing    Exercises Total Joint Exercises Ankle Circles/Pumps: AROM, Both,  20 reps, Supine Quad Sets: AROM, Right, 10 reps, Supine Short Arc Quad: AROM, Right, 10 reps, Supine Heel Slides: AAROM, Right, 10 reps, Supine Hip ABduction/ADduction: AAROM, Right, 10 reps, Supine Long Arc Quad: AAROM, Right, 10 reps, Seated    General Comments        Pertinent Vitals/Pain Pain Assessment Pain Assessment: 0-10 Pain Score: 5  Pain Location: R hip Pain Descriptors / Indicators: Aching, Constant, Burning, Discomfort, Tender, Sore Pain Intervention(s): Limited activity within patient's tolerance, Monitored during session, Repositioned, Premedicated before session (deferred ice pack)    Home Living                          Prior Function            PT Goals (current goals can now be found in the care plan section) Acute Rehab PT Goals Patient Stated Goal: to be able to keep up with the grandchildren run, get up and down off the floor, have knee and feet surgery PT Goal Formulation: With patient Time For Goal Achievement: 11/12/24 Potential to Achieve Goals: Good Progress towards PT goals: Progressing toward goals    Frequency    7X/week      PT Plan      Co-evaluation              AM-PAC PT 6 Clicks Mobility   Outcome Measure  Help needed turning from your back to your side while in a flat bed without using bedrails?: None Help needed moving from lying on your back to sitting on the side of a flat bed without using bedrails?: A Little Help needed moving to and from a bed to a chair (including a wheelchair)?: A Little Help needed standing up from a chair using your arms (e.g., wheelchair or bedside chair)?: A Little Help needed to walk in hospital room?: A Little Help needed climbing 3-5 steps with a railing? : A Little 6 Click Score: 19    End of Session Equipment Utilized During Treatment: Gait belt Activity Tolerance: Patient tolerated treatment well Patient left: with call bell/phone within reach;in chair Nurse  Communication: Mobility status PT Visit Diagnosis: Unsteadiness on feet (R26.81);Other abnormalities of gait and mobility (R26.89);Muscle weakness (generalized) (M62.81);Difficulty in walking, not elsewhere classified (R26.2);Pain Pain - Right/Left: Right Pain - part of body: Hip;Leg     Time: 9160-9081 PT Time Calculation (min) (ACUTE ONLY): 39 min  Charges:    $Gait Training: 8-22 mins $Therapeutic Exercise: 8-22 mins $Therapeutic Activity: 8-22 mins PT General Charges $$ ACUTE PT VISIT: 1 Visit                    Tinnie BERRY PT, DPT  Acute Rehabilitation Services  Office [phone]  10/31/2024, 11:13 AM

## 2024-11-01 ENCOUNTER — Encounter (HOSPITAL_COMMUNITY): Payer: Self-pay | Admitting: Orthopaedic Surgery

## 2024-11-02 ENCOUNTER — Other Ambulatory Visit: Payer: Self-pay | Admitting: Allergy

## 2024-11-04 ENCOUNTER — Other Ambulatory Visit: Payer: Self-pay | Admitting: *Deleted

## 2024-11-04 MED ORDER — TEZSPIRE 210 MG/1.91ML ~~LOC~~ SOSY: 210.0000 mg | PREFILLED_SYRINGE | SUBCUTANEOUS | 11 refills | Status: AC

## 2024-11-11 ENCOUNTER — Encounter: Payer: Self-pay | Admitting: Orthopaedic Surgery

## 2024-11-11 ENCOUNTER — Ambulatory Visit: Admitting: Orthopaedic Surgery

## 2024-11-11 DIAGNOSIS — Z96641 Presence of right artificial hip joint: Secondary | ICD-10-CM

## 2024-11-11 MED ORDER — SULFAMETHOXAZOLE-TRIMETHOPRIM 800-160 MG PO TABS: 1.0000 | ORAL_TABLET | Freq: Two times a day (BID) | ORAL | 0 refills | Status: AC

## 2024-11-11 NOTE — Progress Notes (Signed)
 The patient is a 48 year old female who is 2 weeks tomorrow status post a right total hip arthroplasty.  She is someone who is morbidly obese but used to weigh over 400 pounds but she has lost over 150 pounds or more when we proceed with surgery she was down to 240 pounds.  Her right hip showed severe protrusio thus necessitating a total hip arthroplasty.  Her pain is been severe and her mobility significant limited from her right hip arthritis prior to surgery.  The surgery was still difficult given her morbid obesity and large thigh.  She has been compliant with a baby aspirin  twice daily.  She is ambulate with a rolling walker.  She has appropriate questions that she asked us  about returning to activities.  On exam her right hip incision looks good.  Sutures have been removed and Steri-Strips applied.  She does have a very large seroma and she does have a very large thigh and I was able to aspirate about 330 cc of fluid off of the hip.  I will send in some Bactrim  double strength just for precaution purposes given her obesity.  We will see her back in 3 weeks to see how she is doing overall.  No x-rays are needed at that visit.

## 2024-11-12 ENCOUNTER — Telehealth: Payer: Self-pay | Admitting: *Deleted

## 2024-11-12 ENCOUNTER — Other Ambulatory Visit: Payer: Self-pay | Admitting: Orthopaedic Surgery

## 2024-11-12 MED ORDER — FLUCONAZOLE 150 MG PO TABS: 150.0000 mg | ORAL_TABLET | Freq: Every day | ORAL | 0 refills | Status: AC

## 2024-11-12 NOTE — Telephone Encounter (Signed)
 Patient aware

## 2024-11-12 NOTE — Telephone Encounter (Signed)
 Patient called and forgot to mention yesterday when you said you'd put her on Abx, that she almost always gets a yeast infection when taking. Asked if you could send something oral for this to her pharmacy. Thank you.

## 2024-11-15 ENCOUNTER — Ambulatory Visit: Admitting: Orthopaedic Surgery

## 2024-11-15 ENCOUNTER — Encounter: Payer: Self-pay | Admitting: Orthopaedic Surgery

## 2024-11-15 DIAGNOSIS — Z96641 Presence of right artificial hip joint: Secondary | ICD-10-CM

## 2024-11-15 NOTE — Progress Notes (Signed)
 The patient is a 48 year old who we saw last week at her first postoperative visit status post a right total hip replacement.  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  She is someone who used to weigh 450 pounds and she is down to about 240 pounds.  She denies any fever and chills.    On exam she does have a large seroma.  Her incision itself looks good with Steri-Strips in place.  I was able to aspirate over 300 cc of fluid again from her right hip which gave her immediate relief.  Will see her back in a week because likely she will have reaccumulation of seroma and we will need to aspirate this again.  She agrees as well.

## 2024-11-22 ENCOUNTER — Ambulatory Visit (INDEPENDENT_AMBULATORY_CARE_PROVIDER_SITE_OTHER): Admitting: Orthopaedic Surgery

## 2024-11-22 ENCOUNTER — Encounter: Payer: Self-pay | Admitting: Orthopaedic Surgery

## 2024-11-22 DIAGNOSIS — Z96641 Presence of right artificial hip joint: Secondary | ICD-10-CM

## 2024-11-22 NOTE — Progress Notes (Signed)
 The patient comes in today at close 2 months since her right hip replacement.  We have been seeing her back on a regular basis due to a reaccumulation of her seroma postop.  She has someone who used to be massively morbidly obese and she is also least half her body weight but this still has a large thighs.  On exam she is walking with just a cane now.  Her mobility is much better overall.  Her right hip incision really looks good.  There is still a considerable seroma.  We have removed well over 300 cc from the hip soft tissue in the past.  Today we removed 240 cc of fluid from the soft tissue.  She understands this will still reaccumulate but it is getting less each time.  I would still like to see her back in 1 week for a repeat aspiration.

## 2024-11-27 ENCOUNTER — Other Ambulatory Visit: Payer: Self-pay | Admitting: Allergy & Immunology

## 2024-11-29 ENCOUNTER — Encounter: Attending: Physical Medicine and Rehabilitation | Admitting: Registered Nurse

## 2024-11-29 ENCOUNTER — Encounter: Payer: Self-pay | Admitting: Registered Nurse

## 2024-11-29 ENCOUNTER — Encounter: Payer: Self-pay | Admitting: Orthopaedic Surgery

## 2024-11-29 ENCOUNTER — Encounter: Admitting: Orthopaedic Surgery

## 2024-11-29 VITALS — BP 123/81 | HR 73 | Ht 67.0 in | Wt 238.0 lb

## 2024-11-29 DIAGNOSIS — Z79891 Long term (current) use of opiate analgesic: Secondary | ICD-10-CM | POA: Diagnosis not present

## 2024-11-29 DIAGNOSIS — G894 Chronic pain syndrome: Secondary | ICD-10-CM | POA: Diagnosis not present

## 2024-11-29 DIAGNOSIS — M25561 Pain in right knee: Secondary | ICD-10-CM | POA: Insufficient documentation

## 2024-11-29 DIAGNOSIS — Z5181 Encounter for therapeutic drug level monitoring: Secondary | ICD-10-CM | POA: Diagnosis not present

## 2024-11-29 DIAGNOSIS — M5416 Radiculopathy, lumbar region: Secondary | ICD-10-CM | POA: Diagnosis not present

## 2024-11-29 DIAGNOSIS — M25562 Pain in left knee: Secondary | ICD-10-CM | POA: Diagnosis not present

## 2024-11-29 DIAGNOSIS — G8929 Other chronic pain: Secondary | ICD-10-CM | POA: Insufficient documentation

## 2024-11-29 DIAGNOSIS — Z96641 Presence of right artificial hip joint: Secondary | ICD-10-CM

## 2024-11-29 MED ORDER — OXYCODONE-ACETAMINOPHEN 5-325 MG PO TABS: 1.0000 | ORAL_TABLET | Freq: Four times a day (QID) | ORAL | 0 refills | Status: DC | PRN

## 2024-11-29 MED ORDER — OXYCODONE-ACETAMINOPHEN 5-325 MG PO TABS: 1.0000 | ORAL_TABLET | Freq: Four times a day (QID) | ORAL | 0 refills | Status: AC | PRN

## 2024-11-29 NOTE — Progress Notes (Signed)
 "  Subjective:    Patient ID: [name], female    DOB: 13-Feb-1976, 49 y.o.   MRN: [id number]  HPI: [name] is a 49 y.o. female who returns for follow up appointment for chronic pain and medication refill. She states her pain is located in her lower back radiating into her her right buttock and bilateral knee pain. He rates his pain 5. His current exercise regime is walking with walker  and performing stretching exercises.  On 10/29/2024 : She underwent: RIGHT TOTAL HIP ARTHROPLASTY, ANTERIOR APPROACH by Dr Vernetta   Ms. Locker Morphine equivalent is 30.00 MME.   Last Oral Swab was Performed on 08/26/2024, it was consistent.      Pain Inventory Average Pain 8 Pain Right Now 5 My pain is intermittent, sharp, burning, dull, stabbing, tingling, and aching  In the last 24 hours, has pain interfered with the following? General activity 6 Relation with others 6 Enjoyment of life 7 What TIME of day is your pain at its worst? night Sleep (in general) Poor  Pain is worse with: walking, sitting, inactivity, and standing Pain improves with: medication and heat Relief from Meds: 8  Family History  Problem Relation Age of Onset   Diabetes Mother    Hypertension Mother    Diabetes Father    Asthma Other    Hyperlipidemia Other    Hypertension Other    Colon cancer Neg Hx    Esophageal cancer Neg Hx    Rectal cancer Neg Hx    Stomach cancer Neg Hx    Breast cancer Neg Hx    BRCA 1/2 Neg Hx    Social History   Socioeconomic History   Marital status: Single    Spouse name: Not on file   Number of children: Not on file   Years of education: Not on file   Highest education level: 11th grade  Occupational History   Occupation: disabled  Tobacco Use   Smoking status: Former    Current packs/day: 0.33    Types: Cigarettes    Passive exposure: Current   Smokeless tobacco: Never  Vaping Use   Vaping status: Never Used  Substance and Sexual Activity    Alcohol use: Not Currently   Drug use: No   Sexual activity: Yes    Birth control/protection: Surgical, Pill  Other Topics Concern   Not on file  Social History Narrative   Lives with her daughter   Social Drivers of Health   Tobacco Use: Medium Risk (11/22/2024)   Patient History    Smoking Tobacco Use: Former    Smokeless Tobacco Use: Never    Passive Exposure: Current  Physicist, Medical Strain: Medium Risk (10/07/2024)   Overall Financial Resource Strain (CARDIA)    Difficulty of Paying Living Expenses: Somewhat hard  Food Insecurity: No Food Insecurity (10/29/2024)   Epic    Worried About Programme Researcher, Broadcasting/film/video in the Last Year: Never true    Ran Out of Food in the Last Year: Never true  Recent Concern: Food Insecurity - Food Insecurity Present (10/07/2024)   Epic    Worried About Programme Researcher, Broadcasting/film/video in the Last Year: Sometimes true    Ran Out of Food in the Last Year: Never true  Transportation Needs: No Transportation Needs (10/29/2024)   Epic    Lack of Transportation (Medical): No    Lack of Transportation (Non-Medical): No  Physical Activity: Inactive (10/07/2024)   Exercise Vital Sign  Days of Exercise per Week: 0 days    Minutes of Exercise per Session: Not on file  Stress: No Stress Concern Present (10/07/2024)   [employer] of Occupational Health - Occupational Stress Questionnaire    Feeling of Stress: Not at all  Social Connections: Socially Isolated (10/07/2024)   Social Connection and Isolation Panel    Frequency of Communication with Friends and Family: More than three times a week    Frequency of Social Gatherings with Friends and Family: Three times a week    Attends Religious Services: Never    Active Member of Clubs or Organizations: No    Attends Banker Meetings: Not on file    Marital Status: Divorced  Depression (PHQ2-9): Low Risk (08/26/2024)   Depression (PHQ2-9)    PHQ-2 Score: 0  Recent Concern: Depression (PHQ2-9) - High  Risk (06/07/2024)   Depression (PHQ2-9)    PHQ-2 Score: 12  Alcohol Screen: Low Risk (02/20/2024)   Alcohol Screen    Last Alcohol Screening Score (AUDIT): 0  Housing: Low Risk (10/29/2024)   Epic    Unable to Pay for Housing in the Last Year: No    Number of Times Moved in the Last Year: 0    Homeless in the Last Year: No  Utilities: Not At Risk (10/29/2024)   Epic    Threatened with loss of utilities: No  Health Literacy: Adequate Health Literacy (02/20/2024)   B1300 Health Literacy    Frequency of need for help with medical instructions: Never   Past Surgical History:  Procedure Laterality Date   CESAREAN SECTION     x2   HIATAL HERNIA REPAIR N/A 03/09/2024   Procedure: REPAIR, HERNIA, HIATAL;  Surgeon: Signe Mitzie LABOR, MD;  Location: [facility];  Service: General;  Laterality: N/A;   LAPAROSCOPIC GASTRIC SLEEVE RESECTION N/A 03/09/2024   Procedure: GASTRECTOMY, SLEEVE, LAPAROSCOPIC;  Surgeon: Signe Mitzie LABOR, MD;  Location: [facility];  Service: General;  Laterality: N/A;   TOTAL HIP ARTHROPLASTY Right 10/29/2024   Procedure: RIGHT TOTAL HIP ARTHROPLASTY, ANTERIOR APPROACH;  Surgeon: Vernetta Lonni GRADE, MD;  Location: [facility];  Service: Orthopedics;  Laterality: Right;   TUBAL LIGATION     UPPER GI ENDOSCOPY N/A 03/09/2024   Procedure: ENDOSCOPY, UPPER GI TRACT;  Surgeon: Signe Mitzie LABOR, MD;  Location: [facility];  Service: General;  Laterality: N/A;   WISDOM TOOTH EXTRACTION  2011   Past Surgical History:  Procedure Laterality Date   CESAREAN SECTION     x2   HIATAL HERNIA REPAIR N/A 03/09/2024   Procedure: REPAIR, HERNIA, HIATAL;  Surgeon: Signe Mitzie LABOR, MD;  Location: [facility];  Service: General;  Laterality: N/A;   LAPAROSCOPIC GASTRIC SLEEVE RESECTION N/A 03/09/2024   Procedure: GASTRECTOMY, SLEEVE, LAPAROSCOPIC;  Surgeon: Signe Mitzie LABOR, MD;  Location: [facility];  Service: General;  Laterality: N/A;   TOTAL HIP ARTHROPLASTY Right 10/29/2024   Procedure: RIGHT TOTAL HIP  ARTHROPLASTY, ANTERIOR APPROACH;  Surgeon: Vernetta Lonni GRADE, MD;  Location: [facility];  Service: Orthopedics;  Laterality: Right;   TUBAL LIGATION     UPPER GI ENDOSCOPY N/A 03/09/2024   Procedure: ENDOSCOPY, UPPER GI TRACT;  Surgeon: Signe Mitzie LABOR, MD;  Location: [facility];  Service: General;  Laterality: N/A;   WISDOM TOOTH EXTRACTION  2011   Past Medical History:  Diagnosis Date   Anxiety    Arthritis    Asthma    Diverticulitis of colon    GERD (gastroesophageal reflux disease)  Heart murmur    Hydradenitis    Hyperthyroidism    Graves Disease   Iron (Fe) deficiency anemia    MRSA (methicillin resistant staph aureus) culture positive    Obesity    There were no vitals taken for this visit.  Opioid Risk Score:   Fall Risk Score:  `1  Depression screen [facility] 2/9     08/26/2024    8:53 AM 06/07/2024    2:22 PM 05/26/2024    9:10 AM 03/25/2024    8:54 AM 02/20/2024    9:38 AM 12/15/2023    9:29 AM 12/01/2023    9:35 AM  Depression screen PHQ 2/9  Decreased Interest 0 0 0 0 0 0 0  Down, Depressed, Hopeless 0 0 0  0 0 0  PHQ - 2 Score 0 0 0 0 0 0 0  Altered sleeping 0 3   0    Tired, decreased energy 0 3   0    Change in appetite 0 3   0    Feeling bad or failure about yourself   3   0    Trouble concentrating 0 0   0    Moving slowly or fidgety/restless 0 0   0    Suicidal thoughts 0 0   0    PHQ-9 Score 0  12    0     Difficult doing work/chores     Not difficult at all       Data saved with a previous flowsheet row definition    Review of Systems  Musculoskeletal:  Positive for back pain and gait problem.       Pain in both knees & right hip  All other systems reviewed and are negative.      Objective:   Physical Exam Vitals and nursing note reviewed.  Constitutional:      Appearance: Normal appearance.  Cardiovascular:     Rate and Rhythm: Normal rate and regular rhythm.     Pulses: Normal pulses.     Heart sounds: Normal heart sounds.  Pulmonary:      Effort: Pulmonary effort is normal.     Breath sounds: Normal breath sounds.  Musculoskeletal:     Comments: Normal Muscle Bulk and Muscle Testing Reveals:  Upper Extremities: Full ROM and Muscle Strength 5/5 Lumbar Hypersensitivity Right Greater Trochanter Tenderness  Lower Extremities: Right: Decreased ROM and Muscle Strength 5/5 Right Lower Extremity Flexion Produces Pain into her Right Hip and Right Knee  Left Lower Extremity: Full ROM and Muscle Strength 5/5 Arises from chair slowly using walker for support Antalgic  Gait     Skin:    General: Skin is warm and dry.  Neurological:     Mental Status: She is alert and oriented to person, place, and time.  Psychiatric:        Mood and Affect: Mood normal.        Behavior: Behavior normal.           Assessment & Plan:  Primary  Osteoarthritis of Right Hip: S/P: on 10/29/2024 .RIGHT TOTAL HIP ARTHROPLASTY, ANTERIOR APPROACH by Dr Vernetta Continue HEP as Tolerated. Continue current medication regimen. 11/29/2024 Chronic Pain of bilateral  Knee : Ortho Following. Continue to Monitor. Continue HEP as Tolerated. 11/29/2024 Acute Left Ankle Pain: No complaints today. Ortho following.  Continue to Monitor. 11/29/2024. Morbid Obesity: S/P:  GASTRECTOMY, SLEEVE, LAPAROSCOPIC N/A General  ENDOSCOPY, UPPER GI TRACT N/A General  REPAIR, HERNIA, HIATAL  Continue Healthy Diet Regimen. Continue to Monitor. 11/29/2024. 6.Chronic Pain Syndrome: Refilled: Oxycodone  5-325 mg 1 tablet 4 times  a day as needed for pain #120. Second script sent for the following month. We will continue the opioid monitoring program, this consists of regular clinic visits, examinations, urine drug screen, pill counts as well as use of [place]  Controlled Substance Reporting system. A 12 month History has been reviewed on the   Controlled Substance Reporting System on 11/29/2024   F/U in 2 months   "

## 2024-11-29 NOTE — Progress Notes (Signed)
 The patient is here in continued follow-up as a relates to her right total hip arthroplasty.  We have been seeing her back on a regular basis due to recurrent seroma.  She has someone who is morbidly obese who has lost a significant mount of weight so she has a lot of dead space in that thigh.  She is mobilizing very well.  She is using just a cane to get around and denies any significant pain.  The first visit we removed over 300 cc of fluid from the hip in last week and removed 240 cc.  Today we removed his 360 cc syringes of fluid from her hip so its gotten significant less in terms of the seroma.  Will see her back at her regular scheduled visit on January 15 but no x-rays are needed.  We will likely aspirate her again if needed at that visit.  This is continuing to get less and she understands that as well and is very pleased overall.

## 2024-12-02 ENCOUNTER — Encounter: Payer: Self-pay | Admitting: Podiatry

## 2024-12-02 ENCOUNTER — Ambulatory Visit (INDEPENDENT_AMBULATORY_CARE_PROVIDER_SITE_OTHER): Admitting: Podiatry

## 2024-12-02 DIAGNOSIS — M21621 Bunionette of right foot: Secondary | ICD-10-CM

## 2024-12-02 DIAGNOSIS — M21611 Bunion of right foot: Secondary | ICD-10-CM | POA: Diagnosis not present

## 2024-12-02 NOTE — Patient Instructions (Signed)

## 2024-12-02 NOTE — Progress Notes (Signed)
 Subjective:   Patient ID: [name], female   DOB: 49 y.o.   MRN: [id number]   HPI Patient presents with chronic bunion deformity bilateral and tailor's bunion deformity bilateral with plantarflexed second metatarsal left.  Patient states that she has tried wider shoes she has tried trimming she has tried padding soaks without relief and is interested in surgical intervention   ROS      Objective:  Physical Exam  Neurovascular status intact significant bunion deformity with elevation of the intermetatarsal angle approximate 15 degrees with deviation of the hallux against the second toe mild and tailor's bunion deformity bilateral with keratotic tissue noted     Assessment:  Chronic structural bunion deformity bilateral painful with failure to respond conservatively and tailor's bunion deformity bilateral     Plan:  H&P reviewed both conditions and at this point I did discuss procedures that can be done for this.  I have recommended Austin type osteotomy right and metatarsal osteotomy fifth right with elevation and I have recommended 1 foot be done at the time.  Patient wants surgery and wants right foot done first and I allowed her to read consent form going over surgery and complications and alternative treatments.  After extensive review she signed consent form scheduled for outpatient surgery understanding recovery can take upwards of 6 months with no guarantees and I dispensed air fracture walker properly fitted to her lower leg that I want her to wear during the postoperative.  With all questions answered.  Scheduled for outpatient surgery

## 2024-12-03 ENCOUNTER — Telehealth: Payer: Self-pay | Admitting: Podiatry

## 2024-12-03 NOTE — Telephone Encounter (Signed)
 Called and left messages x2 for surgery to be scheduled. Called patient's sister as instructed per surgical consent.

## 2024-12-09 ENCOUNTER — Encounter: Payer: Self-pay | Admitting: Dietician

## 2024-12-09 ENCOUNTER — Encounter: Attending: Surgery | Admitting: Dietician

## 2024-12-09 ENCOUNTER — Ambulatory Visit: Admitting: Orthopaedic Surgery

## 2024-12-09 ENCOUNTER — Encounter: Payer: Self-pay | Admitting: Orthopaedic Surgery

## 2024-12-09 DIAGNOSIS — Z96641 Presence of right artificial hip joint: Secondary | ICD-10-CM

## 2024-12-09 DIAGNOSIS — E669 Obesity, unspecified: Secondary | ICD-10-CM | POA: Insufficient documentation

## 2024-12-09 NOTE — Progress Notes (Signed)
 The patient is now 6 weeks status post a right total hip replacement to treat significant right hip pain and arthritis.  She is 49 years old.  She has someone who is to be significantly morbidly obese weighing over 400 pounds.  At the time of surgery she was below 250 pounds.  Since surgery we have had to aspirate a large amount of fluid seroma from her hip on several occasions but overall that she is doing great with her mobility with minimal discomfort.  She is ambulate with a cane.  She did have to have a tooth pulled yesterday.  She has a deep cleaning in February.  She says she does not have to use the cane much.  She uses this more for issues with her knees.  On examination her right hip incision looks great.  There is still a seroma and I did aspirate over 60 cc from the hip area but this is much less than before and overall she looks much better.  She does report that in a few weeks she is probably having bunion surgery.  She needs to be careful with her mobility otherwise.  Will see her back in 3 months to see how she is doing overall.  Will have a standing AP pelvis and lateral of her right hip at that visit.

## 2024-12-09 NOTE — Progress Notes (Signed)
 Bariatric Nutrition Follow-Up Visit Medical Nutrition Therapy  Appt Start Time: 0921   End Time: 0949  Surgery date: 03/09/2024 Surgery type: Sleeve Gastrectomy Pt expectation of surgery:  to be 250-260 lbs for hip surgery (new goal 200 lbs)  NUTRITION ASSESSMENT  Anthropometrics  Start weight at [facility]: 324.7 lbs (date: 06/09/2023)  Height: 68 in Weight today: 240.5 lb  Clinical  Pharmacotherapy: History of weight loss medication used: zepbound  has not been approved Medical hx: reflux, asthma Medications: see list   Labs:  Notable signs/symptoms: none noted Any previous deficiencies? No Bowel Habits: Every day to every other day no complaints   Body Composition Scale 03/23/2024 06/08/2024 09/08/2024 12/09/2024  Current Body Weight 284.1 268.8 247.1 240.5  Total Body Fat % 47.2 45.7 43.8 42.7  Visceral Fat 16 15 13 13   Fat-Free Mass % 52.7 54.2 56.1 57.2   Total Body Water  % 40.8 41.6 42.5 43.1  Muscle-Mass lbs 33.4 33.6 33.3 33.6  BMI 43.2 40.9 37.5 36.5  Body Fat Displacement             Torso  lbs 83.2 76.3 67.1 63.7         Left Leg  lbs 16.6 15.2 13.4 12.7         Right Leg  lbs 16.6 15.2 13.4 12.7         Left Arm  lbs 8.3 7.6 6.7 6.3         Right Arm  lbs 8.3 7.6 6.7 6.3    Lifestyle & Dietary Hx  Pt states she had hip surgery, stating that is one reason for the bariatric surgery, to qualify for hip surgery. Pt states he next goal is to be about 200 lbs. Pt states she had physical therapy for 2 weeks after hip  surgery. Pt states she is half way tracking protein, stating she has been dealing with the hip surgery. Pt states she is being honest, stating she is not on track right now. Pt states her friend has been shopping at the grocery store now. Pt states she is going to have surgery on both feet (one right after the other), and states she will knee surgery after the feet surgeries.  Estimated daily fluid intake: 64 oz Estimated daily protein intake: 60  g Supplements: multivitamin and calcium Current average weekly physical activity: ADLs due to hip surgery (starting back, stating she is being cleared this week)  24-Hr Dietary Recall First Meal: egg (scrambled), bacon, cheese Snack: slice of bread with turkey  Second Meal: left overs from the night before (chicken, greens, mac and cheese) Snack: chicken salad with crackers  Third Meal: taco lasagna Snack: nabs Beverages: water    NUTRITION DIAGNOSIS  Overweight/obesity ([place]-3.3) related to past poor dietary habits and physical inactivity as evidenced by completed bariatric surgery and following dietary guidelines for continued weight loss and healthy nutrition status.   NUTRITION INTERVENTION Nutrition counseling (C-1) and education (E-2) to facilitate bariatric surgery goals, including: Aiming for 60 grams of protein per day becomes much easier when you build it into every meal and snack. Centering your choices around the bariatric MyPlate keeps portions balanced and supports steady nutrition: half of your plate is protein, the majority of the remaining half is non-starchy vegetables, and the final small section comes from complex carbohydrates like whole grains, fruit, or starchy vegetables. Including a protein source each time you eat helps you stay satisfied, maintain muscle, and meet your daily target without relying on large portions. This  structure creates a simple, repeatable routine that supports long-term success after surgery. Increasing physical activity after bariatric surgery becomes more complex when youre also recovering from hip surgery and anticipating future procedures on your feet and knee, but movement is still incredibly valuable--and absolutely possible--with the right approach. The key is to focus on gentle, low-impact activity that protects your joints while helping you maintain strength, circulation, and mobility. Walking may still be part of your routine, but the pace,  distance, and frequency should match what feels safe for your hip. Chair-based exercises, seated resistance training, upper-body strength work, and water -based activities (once cleared) can all help you stay active without stressing healing joints. As you move toward additional surgeries, building a foundation of safe, consistent movement supports recovery, preserves muscle, and keeps your body resilient through each stage of healing.  Goals Re-engage: start calcium. Re-engage: increase physical activity; do chair exercise while recovering from foot surgery; get back to the gym; check out the BELT program New: track protein; aim for 60 grams per day; aim for a protein with every meal or snack; use the bariatric MyPlate as a guide.  Handouts Provided Include  Bariatric MyPlate Body Comp Scale printout  Learning Style & Readiness for Change Teaching method utilized: Visual & Auditory  Demonstrated degree of understanding via: Teach Back  Readiness Level: ready Barriers to learning/adherence to lifestyle change: nothing identified  RD's Notes for Next Visit Assess adherence to pt chosen goals  MONITORING & EVALUATION Dietary intake, weekly physical activity, body weight.  Next Steps Patient is to follow-up in 3 months for 1 year post-op follow-up/class.

## 2024-12-16 ENCOUNTER — Telehealth: Payer: Self-pay | Admitting: Podiatry

## 2024-12-16 ENCOUNTER — Ambulatory Visit

## 2024-12-16 DIAGNOSIS — J4550 Severe persistent asthma, uncomplicated: Secondary | ICD-10-CM | POA: Diagnosis not present

## 2024-12-16 NOTE — Telephone Encounter (Signed)
 DOS- 12/28/2024  [place] BUNIONECTOMY RT- 28296 METATARSAL OSTEOTOMY 5TH RT- 28308  [facility] MEDICAID EFFECTIVE DATE- 11/25/2024  DEDUCTIBLE- $283 REMAINING- $0 OOP- $9250 REMAINING- $1071.75 COINSURANCE- 20%  PER [employer] PORTAL, PRIOR AUTH IS NOT REQUIRED FOR CPT CODES 71703 AND 28308. DECISION ID# [id number]

## 2024-12-22 NOTE — Telephone Encounter (Signed)
 Patient sister returned call to schedule surgery. Patient take a 81 mg aspirin  and they have been advised to d/c use for 7 days prior to surgery. Patient not on any GLP1. Pharmacy correct in chart.

## 2024-12-23 ENCOUNTER — Ambulatory Visit: Admitting: Orthopaedic Surgery

## 2024-12-25 ENCOUNTER — Other Ambulatory Visit (HOSPITAL_COMMUNITY): Payer: Self-pay

## 2024-12-28 ENCOUNTER — Other Ambulatory Visit (HOSPITAL_COMMUNITY): Payer: Self-pay

## 2024-12-28 ENCOUNTER — Telehealth: Payer: Self-pay | Admitting: Podiatry

## 2024-12-28 ENCOUNTER — Other Ambulatory Visit: Payer: Self-pay | Admitting: Allergy & Immunology

## 2024-12-28 DIAGNOSIS — M21621 Bunionette of right foot: Secondary | ICD-10-CM

## 2024-12-28 DIAGNOSIS — M21611 Bunion of right foot: Secondary | ICD-10-CM

## 2024-12-28 MED ORDER — PANTOPRAZOLE SODIUM 40 MG PO TBEC
40.0000 mg | DELAYED_RELEASE_TABLET | Freq: Every day | ORAL | 1 refills | Status: AC
  Filled 2024-12-28: qty 90, 90d supply, fill #0

## 2024-12-28 NOTE — Telephone Encounter (Signed)
 Pt was checking on status of prescription being sent in. [facility] to [facility] on file.

## 2024-12-29 ENCOUNTER — Ambulatory Visit (INDEPENDENT_AMBULATORY_CARE_PROVIDER_SITE_OTHER): Admitting: Podiatry

## 2024-12-29 ENCOUNTER — Other Ambulatory Visit: Payer: Self-pay | Admitting: Podiatry

## 2024-12-29 ENCOUNTER — Encounter: Payer: Self-pay | Admitting: Podiatry

## 2024-12-29 ENCOUNTER — Ambulatory Visit (INDEPENDENT_AMBULATORY_CARE_PROVIDER_SITE_OTHER)

## 2024-12-29 DIAGNOSIS — M21611 Bunion of right foot: Secondary | ICD-10-CM

## 2024-12-29 MED ORDER — OXYCODONE-ACETAMINOPHEN 10-325 MG PO TABS: 1.0000 | ORAL_TABLET | ORAL | 0 refills | Status: AC | PRN

## 2024-12-29 MED ORDER — ONDANSETRON 4 MG PO TBDP: 4.0000 mg | ORAL_TABLET | Freq: Three times a day (TID) | ORAL | 0 refills | Status: AC | PRN

## 2024-12-29 NOTE — Progress Notes (Signed)
 Subjective:   Patient ID: [name], female   DOB: 49 y.o.   MRN: [id number]   HPI Patient presents stating that she is having bleeding on her foot and she traumatized it and hit it against the refrigerator last night and is worried about injury   ROS      Objective:  Physical Exam  Neurovascular status ankle good there is some bleeding on the medial side of the right first metatarsal [name] but not active with trauma that occurred to the right foot     Assessment:  Overall doing well but I cannot rule out problems secondary to trauma right     Plan:  Precautionary x-ray taken and indicated osteotomy is healing well fixation in place no indication of problems applied sterile dressing over top of the existing dressing and advised on elevation compression and will be seen back next week again to reevaluate

## 2024-12-29 NOTE — Telephone Encounter (Signed)
 The patient, [name], called back in this morning to check if her prescriptions had been sent to the [facility] she called yesterday but contacted us  again because she is in a lot of pain after the surgery

## 2024-12-29 NOTE — Telephone Encounter (Signed)
 Spoke to patient states her foot is covered in blood advised to schedule appointment to evaluate.

## 2024-12-29 NOTE — Telephone Encounter (Signed)
 Thanks for the update

## 2024-12-29 NOTE — Telephone Encounter (Signed)
 Tell her [facility] and should be ready for pickup. Sorry about the confusion but yesterday you had mentioned [facility]

## 2025-01-03 ENCOUNTER — Encounter: Admitting: Podiatry

## 2025-01-07 ENCOUNTER — Ambulatory Visit: Admitting: Nurse Practitioner

## 2025-01-13 ENCOUNTER — Ambulatory Visit

## 2025-01-17 ENCOUNTER — Encounter: Admitting: Podiatry

## 2025-01-18 ENCOUNTER — Ambulatory Visit: Admitting: Dermatology

## 2025-02-24 ENCOUNTER — Ambulatory Visit

## 2025-02-28 ENCOUNTER — Encounter: Admitting: Registered Nurse

## 2025-03-08 ENCOUNTER — Encounter: Admitting: Dietician

## 2025-03-09 ENCOUNTER — Ambulatory Visit: Admitting: Orthopaedic Surgery

## 2025-03-31 ENCOUNTER — Ambulatory Visit: Admitting: Allergy & Immunology

## 2025-04-07 ENCOUNTER — Ambulatory Visit: Admitting: Nurse Practitioner
